# Patient Record
Sex: Male | Born: 1970 | Race: Black or African American | Hispanic: No | Marital: Married | Smoking: Never smoker
Health system: Southern US, Community
[De-identification: ages and names within clinical notes are randomized; demographics above are authoritative.]

## PROBLEM LIST (undated history)

## (undated) DIAGNOSIS — I743 Embolism and thrombosis of arteries of the lower extremities: Secondary | ICD-10-CM

## (undated) DIAGNOSIS — I10 Essential (primary) hypertension: Secondary | ICD-10-CM

## (undated) DIAGNOSIS — Z87828 Personal history of other (healed) physical injury and trauma: Secondary | ICD-10-CM

## (undated) DIAGNOSIS — G4730 Sleep apnea, unspecified: Secondary | ICD-10-CM

## (undated) DIAGNOSIS — N186 End stage renal disease: Secondary | ICD-10-CM

## (undated) DIAGNOSIS — E559 Vitamin D deficiency, unspecified: Secondary | ICD-10-CM

## (undated) DIAGNOSIS — I87009 Postthrombotic syndrome without complications of unspecified extremity: Secondary | ICD-10-CM

## (undated) DIAGNOSIS — D649 Anemia, unspecified: Secondary | ICD-10-CM

## (undated) DIAGNOSIS — R600 Localized edema: Secondary | ICD-10-CM

## (undated) DIAGNOSIS — N189 Chronic kidney disease, unspecified: Secondary | ICD-10-CM

## (undated) DIAGNOSIS — Z992 Dependence on renal dialysis: Secondary | ICD-10-CM

## (undated) DIAGNOSIS — Z86718 Personal history of other venous thrombosis and embolism: Secondary | ICD-10-CM

## (undated) DIAGNOSIS — K5790 Diverticulosis of intestine, part unspecified, without perforation or abscess without bleeding: Secondary | ICD-10-CM

## (undated) DIAGNOSIS — E785 Hyperlipidemia, unspecified: Secondary | ICD-10-CM

## (undated) HISTORY — DX: Personal history of other (healed) physical injury and trauma: Z87.828

## (undated) HISTORY — DX: Vitamin D deficiency, unspecified: E55.9

## (undated) HISTORY — DX: Chronic kidney disease, unspecified: N18.9

## (undated) HISTORY — DX: Diverticulosis of intestine, part unspecified, without perforation or abscess without bleeding: K57.90

## (undated) HISTORY — DX: Sleep apnea, unspecified: G47.30

## (undated) HISTORY — DX: Embolism and thrombosis of arteries of the lower extremities: I74.3

## (undated) HISTORY — DX: Personal history of other venous thrombosis and embolism: Z86.718

## (undated) HISTORY — DX: Essential (primary) hypertension: I10

## (undated) HISTORY — DX: Hyperlipidemia, unspecified: E78.5

## (undated) HISTORY — DX: End stage renal disease: N18.6

## (undated) HISTORY — DX: Localized edema: R60.0

## (undated) HISTORY — DX: End stage renal disease: Z99.2

## (undated) HISTORY — DX: Anemia, unspecified: D64.9

## (undated) HISTORY — DX: Postthrombotic syndrome without complications of unspecified extremity: I87.009

---

## 2000-01-08 ENCOUNTER — Encounter: Admission: RE | Admit: 2000-01-08 | Discharge: 2000-04-07 | Payer: Self-pay | Admitting: Internal Medicine

## 2002-04-09 ENCOUNTER — Emergency Department (HOSPITAL_COMMUNITY): Admission: EM | Admit: 2002-04-09 | Discharge: 2002-04-10 | Payer: Self-pay | Admitting: Emergency Medicine

## 2002-05-26 ENCOUNTER — Encounter (HOSPITAL_BASED_OUTPATIENT_CLINIC_OR_DEPARTMENT_OTHER): Admission: RE | Admit: 2002-05-26 | Discharge: 2002-08-24 | Payer: Self-pay | Admitting: Internal Medicine

## 2004-08-29 ENCOUNTER — Emergency Department (HOSPITAL_COMMUNITY): Admission: EM | Admit: 2004-08-29 | Discharge: 2004-08-29 | Payer: Self-pay | Admitting: Emergency Medicine

## 2006-12-10 ENCOUNTER — Encounter: Admission: RE | Admit: 2006-12-10 | Discharge: 2006-12-10 | Payer: Self-pay | Admitting: Emergency Medicine

## 2006-12-18 ENCOUNTER — Ambulatory Visit (HOSPITAL_BASED_OUTPATIENT_CLINIC_OR_DEPARTMENT_OTHER): Admission: RE | Admit: 2006-12-18 | Discharge: 2006-12-18 | Payer: Self-pay | Admitting: General Surgery

## 2006-12-18 ENCOUNTER — Encounter (INDEPENDENT_AMBULATORY_CARE_PROVIDER_SITE_OTHER): Payer: Self-pay | Admitting: General Surgery

## 2010-02-27 ENCOUNTER — Emergency Department (HOSPITAL_COMMUNITY)
Admission: EM | Admit: 2010-02-27 | Discharge: 2010-02-27 | Payer: Self-pay | Source: Home / Self Care | Admitting: Emergency Medicine

## 2010-03-24 ENCOUNTER — Encounter: Payer: Self-pay | Admitting: Emergency Medicine

## 2010-06-13 ENCOUNTER — Other Ambulatory Visit (HOSPITAL_COMMUNITY): Payer: Self-pay | Admitting: Nephrology

## 2010-06-19 ENCOUNTER — Other Ambulatory Visit: Payer: Self-pay | Admitting: Diagnostic Radiology

## 2010-06-19 ENCOUNTER — Ambulatory Visit (HOSPITAL_COMMUNITY)
Admission: RE | Admit: 2010-06-19 | Discharge: 2010-06-19 | Disposition: A | Discharge status: Home / Self Care | Payer: Federal, State, Local not specified - PPO | Source: Ambulatory Visit | Attending: Nephrology | Admitting: Nephrology

## 2010-06-19 DIAGNOSIS — N049 Nephrotic syndrome with unspecified morphologic changes: Secondary | ICD-10-CM | POA: Insufficient documentation

## 2010-06-19 LAB — CBC
Hemoglobin: 11.7 g/dL — ABNORMAL LOW (ref 13.0–17.0)
MCV: 86.9 fL (ref 78.0–100.0)
RBC: 4.11 MIL/uL — ABNORMAL LOW (ref 4.22–5.81)
WBC: 6.6 10*3/uL (ref 4.0–10.5)

## 2010-06-19 LAB — PROTIME-INR: INR: 0.89 (ref 0.00–1.49)

## 2010-07-09 ENCOUNTER — Ambulatory Visit
Admission: RE | Admit: 2010-07-09 | Discharge: 2010-07-09 | Disposition: A | Discharge status: Home / Self Care | Payer: Federal, State, Local not specified - PPO | Source: Ambulatory Visit | Attending: Nephrology | Admitting: Nephrology

## 2010-07-09 ENCOUNTER — Other Ambulatory Visit: Payer: Self-pay | Admitting: Nephrology

## 2010-07-09 DIAGNOSIS — N052 Unspecified nephritic syndrome with diffuse membranous glomerulonephritis: Secondary | ICD-10-CM

## 2010-07-16 NOTE — Op Note (Signed)
NAMEKEVONTA, SPREHE            ACCOUNT NO.:  [id number]   MEDICAL RECORD NO.:  [id number]          PATIENT TYPE:  AMB   LOCATION:  [place]                          FACILITY:  [facility]   PHYSICIAN:  Mare Loan, MD      DATE OF BIRTH:  January 16, 1971   DATE OF PROCEDURE:  12/18/2006  DATE OF DISCHARGE:  12/18/2006                               OPERATIVE REPORT   PREOPERATIVE DIAGNOSIS:  Soft tissue mass.   POSTOPERATIVE DIAGNOSIS:  Soft tissue mass.   PROCEDURE:  Excision of a soft tissue mass, presumed lipoma.   SURGEON:  Mare Loan, MD   INDICATIONS FOR PROCEDURE:  Mr. Lather is a 40 year old male who had a  lump noticed on his left posterior thorax region.  Over the past year,  he believes it had been growing in size and was a concern to him.  Informed consent was obtained prior to the procedure.  The patient was  placed under general endotracheal anesthesia.   SPECIMENS:  Fatty tissue, sent to pathology for review.   COMPLICATIONS:  There were no immediate complications.   DRAINS:  None were placed.   ESTIMATED BLOOD LOSS:  Minimal.   DESCRIPTION OF PROCEDURE:  Mr. Ondo was identified in the  preoperative holding suite and received preoperative antibiotics of  Kefzol.  He was then taken to the operating room, where he received  general endotracheal anesthesia.  He was then placed in a prone  position.  His right posterior thorax was prepped and draped in the  usual sterile fashion.  A timeout procedure, indicating the patient and  the procedure, were performed.  The area over the lesion was  anesthetized with 0.25% Marcaine.  Using a #10 blade, skin was incised,  approximately 5 cm over the area of concern.  Using blunt dissection,  the fatty tissue was removed with the electrocautery.  The specimen was  fully removed.  I did continue the dissection down to the latissimus  dorsi.  The area was irrigated.  A small superior portion of additional  fatty tissue was  removed and sent to pathology.  There was no evidence  of bleeding at the end of the case.  The dermal layer was closed with 3-  0 Vicryl and the skin was closed with 4-0 Monocryl.  Steri-Strips, gauze  and Tegaderm were placed for final dressing.  The patient was then  extubated and transported to the postanesthesia care unit in stable  condition.     Mare Loan, MD  Electronically Signed    ALA/MEDQ  D:  12/18/2006  T:  12/19/2006  Job:  [id number]

## 2010-12-11 LAB — BASIC METABOLIC PANEL
BUN: 11
Chloride: 106
Creatinine, Ser: 1.15
GFR calc non Af Amer: >60
Glucose, Bld: 93
Potassium: 4

## 2010-12-11 LAB — POCT HEMOGLOBIN-HEMACUE: Hemoglobin: 15.1

## 2011-09-23 ENCOUNTER — Encounter: Payer: Self-pay | Admitting: Family Medicine

## 2012-02-02 LAB — CBC AND DIFFERENTIAL
HCT: 34 % — AB (ref 41–53)
Hemoglobin: 11.9 g/dL — AB (ref 13.5–17.5)

## 2012-02-03 LAB — BASIC METABOLIC PANEL: Glucose: 131 mg/dL

## 2012-05-28 ENCOUNTER — Encounter: Payer: Self-pay | Admitting: *Deleted

## 2012-08-09 ENCOUNTER — Ambulatory Visit (INDEPENDENT_AMBULATORY_CARE_PROVIDER_SITE_OTHER): Payer: Federal, State, Local not specified - PPO | Admitting: Emergency Medicine

## 2012-08-09 VITALS — BP 111/74 | HR 109 | Temp 98.5°F | Resp 16 | Ht 69.0 in | Wt 326.0 lb

## 2012-08-09 DIAGNOSIS — IMO0002 Reserved for concepts with insufficient information to code with codable children: Secondary | ICD-10-CM

## 2012-08-09 DIAGNOSIS — S83206A Unspecified tear of unspecified meniscus, current injury, right knee, initial encounter: Secondary | ICD-10-CM

## 2012-08-09 MED ORDER — TRAMADOL HCL 50 MG PO TABS: 50.0000 mg | ORAL_TABLET | Freq: Three times a day (TID) | ORAL | Status: DC | PRN

## 2012-08-09 NOTE — Patient Instructions (Addendum)
Knee, Cartilage (Meniscus) Injury It is suspected that you have a torn cartilage (meniscus) in your knee. The menisci are made of tough cartilage, and fit between the surfaces of the thigh and leg bones. The menisci are "C"shaped and have a wedged profile. The wedged profile helps the stability of the joint by keeping the rounded femur surface from sliding off the flat tibial surface. The menisci are fed (nourished) by small blood vessels; but there is also a large area at the inner edge of the meniscus that does not have a good blood supply (avascular). This presents a problem when there is an injury to the meniscus, because areas without good blood supply heal poorly. As a result when there is a torn cartilage in the knee, surgery is often required to fix it. This is usually done with a surgical procedure less invasive than open surgery (arthroscopy). Some times open surgery of the knee is required if there is other damage. PURPOSE OF THE MENISCUS The medial meniscus rests on the medial tibial plateau. The tibia is the large bone in your lower leg (the shin bone). The medial tibial plateau is the upper end of the bone making up the inner part of your knee. The lateral meniscus serves the same purpose and is located on the outside of the knee. The menisci help to distribute your body weight across the knee joint; they act as shock absorbers. Without the meniscus present, the weight of your body would be unevenly applied to the bones in your legs (the femur and tibia). The femur is the large bone in your thigh. This uneven weight distribution would cause increased wear and tear on the cartilage lining the joint surfaces, leading to early damage (arthritis) of these areas. The presence of the menisci cartilage is necessary for a healthy knee. PURPOSE OF THE KNEE CARTILAGE The knee joint is made up of three bones: the thigh bone (femur), the shin bone (tibia), and the knee cap (patella). The surfaces of these  bones at the knee joint are covered with cartilage called articular cartilage. This smooth slippery surface allows the bones to slide against each other without causing bone damage. The meniscus sits between these cartilaginous surfaces of the bones. It distributes the weight evenly in the joints and helps with the stability of the joint (keeps the joint steady). HOME CARE INSTRUCTIONS  Use crutches and external braces as instructed.  Once home an ice pack applied to your injured knee may help with discomfort and keep the swelling down. An ice pack can be used for the first couple of days or as instructed.  Only take over-the-counter or prescription medicines for pain, discomfort, or fever as directed by your caregiver.  Call if you do not have relief of pain with medications or if there is increasing in pain.  Call if your foot becomes cold or blue.  You may resume normal diet and activities as directed.  Make sure to keep your appointment with your follow-up caregiver. This injury may require further evaluation and treatment beyond the temporary treatment given today. Document Released: 05/10/2002 Document Revised: 05/12/2011 Document Reviewed: 09/01/2008 [employer] Patient Information 2014 [employer].  

## 2012-08-09 NOTE — Progress Notes (Signed)
[facility] [address], [place] [address] [phone]  Date:  08/09/2012   Name:  Hector Brooks   DOB:  [birth year]   MRN:  [id number]  PCP:  No PCP Per Patient    Chief Complaint: Knee Pain   History of Present Illness:  Hector Brooks is a 42 y.o. very pleasant male patient who presents with the following:  No history of defined injury to knee.  Says that he awakened in November last year and his knee swelled and was painful.  Now has intermittent locking and clicking in right knee associated with swelling and pain. Works as a Freight forwarder.  No improvement with over the counter medications or other home remedies. Denies other complaint or health concern today.   There are no active problems to display for this patient.   Past Medical History  Diagnosis Date  . Chronic kidney disease     History reviewed. No pertinent past surgical history.  History  Substance Use Topics  . Smoking status: Never Smoker   . Smokeless tobacco: Not on file  . Alcohol Use: No    History reviewed. No pertinent family history.  Allergies not on file  Medication list has been reviewed and updated.  No current outpatient prescriptions on file prior to visit.   No current facility-administered medications on file prior to visit.    Review of Systems:  As per HPI, otherwise negative.    Physical Examination: Filed Vitals:   08/09/12 1405  BP: 111/74  Pulse: 109  Temp: 98.5 F (36.9 C)  Resp: 16   Filed Vitals:   08/09/12 1405  Height: 5\' 9"  (1.753 m)  Weight: 326 lb (147.873 kg)   Body mass index is 48.12 kg/(m^2). Ideal Body Weight: Weight in (lb) to have BMI = 25: 168.9   GEN: WDWN, NAD, Non-toxic, Alert & Oriented x 3 HEENT: Atraumatic, Normocephalic.  Ears and Nose: No external deformity. EXTR: No clubbing/cyanosis/edema NEURO: Normal gait.  PSYCH: Normally interactive. Conversant. Not depressed or anxious appearing.  Calm demeanor.   KNEE:  No effusion, locking or clicking. Joint stable.   Assessment and Plan: Meniscus tear Ortho referral due inability to prescribe NSAID   Signed,  Ellison Carwin, MD

## 2012-08-19 ENCOUNTER — Ambulatory Visit
Admission: RE | Admit: 2012-08-19 | Discharge: 2012-08-19 | Disposition: A | Discharge status: Home / Self Care | Payer: Federal, State, Local not specified - PPO | Source: Ambulatory Visit | Attending: Emergency Medicine | Admitting: Emergency Medicine

## 2012-08-19 DIAGNOSIS — S83206A Unspecified tear of unspecified meniscus, current injury, right knee, initial encounter: Secondary | ICD-10-CM

## 2012-12-02 ENCOUNTER — Encounter (HOSPITAL_COMMUNITY): Payer: Self-pay | Admitting: Emergency Medicine

## 2012-12-02 ENCOUNTER — Emergency Department (HOSPITAL_COMMUNITY)
Admission: EM | Admit: 2012-12-02 | Discharge: 2012-12-02 | Disposition: A | Discharge status: Home / Self Care | Payer: Federal, State, Local not specified - PPO | Attending: Emergency Medicine | Admitting: Emergency Medicine

## 2012-12-02 DIAGNOSIS — Z79899 Other long term (current) drug therapy: Secondary | ICD-10-CM | POA: Insufficient documentation

## 2012-12-02 DIAGNOSIS — N189 Chronic kidney disease, unspecified: Secondary | ICD-10-CM | POA: Insufficient documentation

## 2012-12-02 DIAGNOSIS — T4995XA Adverse effect of unspecified topical agent, initial encounter: Secondary | ICD-10-CM | POA: Insufficient documentation

## 2012-12-02 DIAGNOSIS — T7840XA Allergy, unspecified, initial encounter: Secondary | ICD-10-CM

## 2012-12-02 DIAGNOSIS — L500 Allergic urticaria: Secondary | ICD-10-CM | POA: Insufficient documentation

## 2012-12-02 DIAGNOSIS — L509 Urticaria, unspecified: Secondary | ICD-10-CM

## 2012-12-02 MED ORDER — PREDNISONE 20 MG PO TABS
60.0000 mg | ORAL_TABLET | Freq: Once | ORAL | Status: AC
  Administered 2012-12-02: 60 mg via ORAL
  Filled 2012-12-02: qty 3

## 2012-12-02 MED ORDER — FAMOTIDINE 20 MG PO TABS
20.0000 mg | ORAL_TABLET | Freq: Once | ORAL | Status: AC
  Administered 2012-12-02: 20 mg via ORAL
  Filled 2012-12-02: qty 1

## 2012-12-02 MED ORDER — HYDROXYZINE HCL 25 MG PO TABS: 25.0000 mg | ORAL_TABLET | Freq: Four times a day (QID) | ORAL | Status: DC

## 2012-12-02 MED ORDER — HYDROXYZINE HCL 25 MG PO TABS
50.0000 mg | ORAL_TABLET | Freq: Once | ORAL | Status: AC
  Administered 2012-12-02: 50 mg via ORAL
  Filled 2012-12-02: qty 2

## 2012-12-02 MED ORDER — FAMOTIDINE 20 MG PO TABS: 20.0000 mg | ORAL_TABLET | Freq: Two times a day (BID) | ORAL | Status: DC

## 2012-12-02 MED ORDER — PREDNISONE 10 MG PO TABS: ORAL_TABLET | ORAL | Status: DC

## 2012-12-02 NOTE — ED Provider Notes (Signed)
Medical screening examination/treatment/procedure(s) were performed by non-physician practitioner and as supervising physician I was immediately available for consultation/collaboration.  Jasper Riling. Alvino Chapel, MD 12/02/12 [phone]

## 2012-12-02 NOTE — ED Notes (Signed)
Pt sts last night while he was sleeping he woke up and was very itching, took some benadryl and went back to sleep, woke up and was still itchy then went to work [employer]) and it itching became worse so he decided to come get checked out. Pt sts he has a rash all over body. Denies use of new lotions/laundry detergents/soap. NKA. Sts he ate baked chicken, potatoes and broccoli, sts he has eaten this items multiple times and never had issues. Pt is afraid he got bite by something outside because he works outside delivering mail and has been bitten but things in the past. Pt in nad, skin warm and dry, resp e/u. Airway intact, speaking in full sentences.

## 2012-12-02 NOTE — ED Provider Notes (Signed)
CSN: [id number]     Arrival date & time 12/02/12  0755 History   First MD Initiated Contact with Patient 12/02/12 0759     Chief Complaint  Patient presents with  . Allergic Reaction   (Consider location/radiation/quality/duration/timing/severity/associated sxs/prior Treatment) HPI Hector Brooks is a 42 y.o. male who presents to emergency department complaining of rash and itching. Patient states that he works for [employer] outside [employer], states that yesterday after he came home from work she started having itching to the arms and legs.. He states he took Benadryl yesterday which seemed to improve his symptoms. States he woke up this morning again with the same rash and itching. States he did not take any medications today. Patient denies any new products as far as he can remember. He denies any new soaps, lotions, new detergent. Patient states the rash is mainly in his thighs, upper arms, back. He denies any respiratory difficulties. He denies any rash inside his mouth, he denies any swelling of his tongue, lips, throat. He denies any prior similar reactions. He denies coming in contact with any new chemicals or products. He denies any new foods.  Past Medical History  Diagnosis Date  . Chronic kidney disease    History reviewed. No pertinent past surgical history. No family history on file. History  Substance Use Topics  . Smoking status: Never Smoker   . Smokeless tobacco: Not on file  . Alcohol Use: No    Review of Systems  Constitutional: Negative for fever and chills.  HENT: Negative for facial swelling, neck pain and neck stiffness.   Respiratory: Negative for cough, chest tightness and shortness of breath.   Cardiovascular: Negative for chest pain, palpitations and leg swelling.  Genitourinary: Negative for dysuria, urgency, frequency and hematuria.  Musculoskeletal: Negative for myalgias and arthralgias.  Skin: Positive for rash.  Allergic/Immunologic:  Negative for immunocompromised state.  Neurological: Negative for dizziness, weakness, light-headedness, numbness and headaches.    Allergies  Review of patient's allergies indicates no known allergies.  Home Medications   Current Outpatient Rx  Name  Route  Sig  Dispense  Refill  . amLODipine (NORVASC) 10 MG tablet   Oral   Take 10 mg by mouth daily.         Marland Kitchen atorvastatin (LIPITOR) 80 MG tablet   Oral   Take 80 mg by mouth daily.         Marland Kitchen labetalol (NORMODYNE) 200 MG tablet   Oral   Take 200 mg by mouth 2 (two) times daily.         Marland Kitchen lisinopril-hydrochlorothiazide (PRINZIDE,ZESTORETIC) 20-25 MG per tablet   Oral   Take 1 tablet by mouth daily.         . Multiple Vitamin (MULTIVITAMIN) tablet   Oral   Take 1 tablet by mouth daily.         Marland Kitchen torsemide (DEMADEX) 20 MG tablet   Oral   Take 20 mg by mouth daily.         . traMADol (ULTRAM) 50 MG tablet   Oral   Take 1 tablet (50 mg total) by mouth every 8 (eight) hours as needed for pain.   30 tablet   0    BP 120/75  Pulse 88  Temp(Src) 97.9 F (36.6 C) (Oral)  Resp 20  Ht 5\' 9"  (1.753 m)  Wt 320 lb (145.151 kg)  BMI 47.23 kg/m2  SpO2 98% Physical Exam  Nursing note and vitals reviewed. Constitutional:  He is oriented to person, place, and time. He appears well-developed and well-nourished. No distress.  HENT:  Head: Normocephalic and atraumatic.  No oral mucosal rash. No swelling of the lips, tongue, uvula  Eyes: Conjunctivae are normal.  Neck: Neck supple.  Cardiovascular: Normal rate, regular rhythm and normal heart sounds.   Pulmonary/Chest: Effort normal. No respiratory distress. He has no wheezes. He has no rales.  Musculoskeletal: He exhibits no edema.  Neurological: He is alert and oriented to person, place, and time.  Skin: Skin is warm and dry.  Hives to the bilateral thighs, upper arms, back.    ED Course  Procedures (including critical care time) Labs Review Labs Reviewed -  No data to display Imaging Review No results found.  MDM   1. Allergic reaction, initial encounter   2. Urticaria     9:36 AM Patient received 50 mg of Vistaril, 20 mg of Pepcid, 60 mg of prednisone emergency department for his hives. He has no respiratory problems, no swelling of the lips, tongue, throat. He is in no distress. Patient will be discharged home with vistaril, pepcid, and 4 more days if prednisone. Instructed to use hypoallergenic products. Follow up with pcp. Return precautions given.   Filed Vitals:   12/02/12 0830  BP: 103/62  Pulse: 91  Temp:   Resp:       Casen Pryor A Terryn Redner, PA-C 12/02/12 1043

## 2013-01-31 HISTORY — PX: MENISCUS REPAIR: SHX5179

## 2013-03-10 ENCOUNTER — Emergency Department (HOSPITAL_COMMUNITY)
Admission: EM | Admit: 2013-03-10 | Discharge: 2013-03-10 | Disposition: A | Discharge status: Home / Self Care | Payer: Federal, State, Local not specified - PPO | Attending: Emergency Medicine | Admitting: Emergency Medicine

## 2013-03-10 ENCOUNTER — Telehealth: Payer: Self-pay

## 2013-03-10 ENCOUNTER — Other Ambulatory Visit (HOSPITAL_COMMUNITY): Payer: Self-pay | Admitting: Orthopedic Surgery

## 2013-03-10 ENCOUNTER — Ambulatory Visit (HOSPITAL_COMMUNITY)
Admission: RE | Admit: 2013-03-10 | Discharge: 2013-03-10 | Disposition: A | Discharge status: Home / Self Care | Payer: Federal, State, Local not specified - PPO | Source: Ambulatory Visit | Attending: Orthopedic Surgery | Admitting: Orthopedic Surgery

## 2013-03-10 ENCOUNTER — Encounter (HOSPITAL_COMMUNITY): Payer: Self-pay | Admitting: Emergency Medicine

## 2013-03-10 DIAGNOSIS — N189 Chronic kidney disease, unspecified: Secondary | ICD-10-CM | POA: Insufficient documentation

## 2013-03-10 DIAGNOSIS — M25561 Pain in right knee: Secondary | ICD-10-CM

## 2013-03-10 DIAGNOSIS — I82409 Acute embolism and thrombosis of unspecified deep veins of unspecified lower extremity: Secondary | ICD-10-CM | POA: Insufficient documentation

## 2013-03-10 DIAGNOSIS — M7989 Other specified soft tissue disorders: Secondary | ICD-10-CM

## 2013-03-10 DIAGNOSIS — Z79899 Other long term (current) drug therapy: Secondary | ICD-10-CM | POA: Insufficient documentation

## 2013-03-10 DIAGNOSIS — I82401 Acute embolism and thrombosis of unspecified deep veins of right lower extremity: Secondary | ICD-10-CM

## 2013-03-10 LAB — POCT I-STAT, CHEM 8
BUN: 14 mg/dL (ref 6–23)
CALCIUM ION: 1.28 mmol/L — AB (ref 1.12–1.23)
CREATININE: 1.3 mg/dL (ref 0.50–1.35)
Chloride: 103 mEq/L (ref 96–112)
GLUCOSE: 91 mg/dL (ref 70–99)
HCT: 40 % (ref 39.0–52.0)
Hemoglobin: 13.6 g/dL (ref 13.0–17.0)
Potassium: 4.5 mEq/L (ref 3.7–5.3)
Sodium: 145 mEq/L (ref 137–147)
TCO2: 30 mmol/L (ref 0–100)

## 2013-03-10 MED ORDER — WARFARIN SODIUM 5 MG PO TABS: 5.0000 mg | ORAL_TABLET | Freq: Every day | ORAL | Status: DC

## 2013-03-10 MED ORDER — ENOXAPARIN SODIUM 60 MG/0.6ML ~~LOC~~ SOLN
150.0000 mg | Freq: Once | SUBCUTANEOUS | Status: DC
Start: 2013-03-10 — End: 2013-03-10
  Filled 2013-03-10: qty 1.8

## 2013-03-10 MED ORDER — ENOXAPARIN SODIUM 30 MG/0.3ML ~~LOC~~ SOLN: 1.0000 mg/kg | Freq: Two times a day (BID) | SUBCUTANEOUS | Status: DC

## 2013-03-10 MED ORDER — ENOXAPARIN SODIUM 150 MG/ML ~~LOC~~ SOLN
150.0000 mg | Freq: Once | SUBCUTANEOUS | Status: AC
  Administered 2013-03-10: 150 mg via SUBCUTANEOUS
  Filled 2013-03-10: qty 1

## 2013-03-10 NOTE — ED Provider Notes (Signed)
Medical screening examination/treatment/procedure(s) were performed by non-physician practitioner and as supervising physician I was immediately available for consultation/collaboration.  EKG Interpretation   None         Marnie Fazzino T Vibhav Waddill, MD 03/10/13 2344 

## 2013-03-10 NOTE — Telephone Encounter (Signed)
Opened in error

## 2013-03-10 NOTE — Discharge Instructions (Signed)
Take Coumadin and Lovenox as directed. Follow up with a doctor at [facility] who will monitor your blood thinners. Refer to attached documents for more information. Return to the ED with worsening or concerning symptoms.

## 2013-03-10 NOTE — ED Notes (Signed)
Pt had surgery to R knee to repair torn meniscus on Dec 12.  Was sent for [place] by pcp today and had a positive for DVT.  Swelling to R lower leg.

## 2013-03-10 NOTE — Progress Notes (Signed)
VASCULAR LAB PRELIMINARY  PRELIMINARY  PRELIMINARY  PRELIMINARY  Right lower extremity venous Doppler completed.    Preliminary report:  There is acute, non occlusive DVT noted in the right common femoral vein and occlusive DVT in the femoral, popliteal, and posterior tibial veins.  No propagation noted to the left lower extremity.  Olena Willy, RVT 03/10/2013, 4:03 PM

## 2013-03-10 NOTE — ED Provider Notes (Signed)
CSN: [id number]     Arrival date & time 03/10/13  1559 History  This chart was scribed for non-physician practitioner Alvina Chou, PA-C working with Ephraim Hamburger, MD by Rolanda Lundborg, ED Scribe. This patient was seen in room TR08C/TR08C and the patient's care was started at 6:38 PM.   Chief Complaint  Patient presents with  . DVT   The history is provided by the patient. No language interpreter was used.   HPI Comments: Hector Brooks is a 43 y.o. male who presents to the Emergency Department with a DVT to his right lower leg with associated swelling and pain. Pt had surgery to right knee to repair torn meniscus on 12/12 and was sent for a followup US today. Pt was positive for DVT and was told to come here.  He reports the swelling has been present for 2 weeks. He reports the right lower leg is warm to touch.  [facility] - [facility] [address]   Past Medical History  Diagnosis Date  . Chronic kidney disease    Past Surgical History  Procedure Laterality Date  . Meniscus repair Right 12/14   No family history on file. History  Substance Use Topics  . Smoking status: Never Smoker   . Smokeless tobacco: Not on file  . Alcohol Use: No    Review of Systems  Cardiovascular: Positive for leg swelling.  All other systems reviewed and are negative.    Allergies  Review of patient's allergies indicates no known allergies.  Home Medications   Current Outpatient Rx  Name  Route  Sig  Dispense  Refill  . amLODipine (NORVASC) 10 MG tablet   Oral   Take 10 mg by mouth daily.         Marland Kitchen atorvastatin (LIPITOR) 80 MG tablet   Oral   Take 80 mg by mouth at bedtime.          . famotidine (PEPCID) 20 MG tablet   Oral   Take 1 tablet (20 mg total) by mouth 2 (two) times daily.   30 tablet   0   . IBUPROFEN PO   Oral   Take 2-3 tablets by mouth 2 (two) times daily as needed (pain).         Marland Kitchen labetalol (NORMODYNE) 200 MG tablet   Oral  Take 200 mg by mouth 2 (two) times daily.         Marland Kitchen lisinopril (PRINIVIL,ZESTRIL) 40 MG tablet   Oral   Take 40 mg by mouth daily.         . Multiple Vitamin (MULTIVITAMIN) tablet   Oral   Take 1 tablet by mouth daily.         Marland Kitchen torsemide (DEMADEX) 20 MG tablet   Oral   Take 20 mg by mouth daily.          BP 135/87  Pulse 81  Temp(Src) 97.5 F (36.4 C) (Oral)  Resp 20  SpO2 98% Physical Exam  Nursing note and vitals reviewed. Constitutional: He is oriented to person, place, and time. He appears well-developed and well-nourished. No distress.  HENT:  Head: Normocephalic and atraumatic.  Eyes: EOM are normal.  Neck: Neck supple. No tracheal deviation present.  Cardiovascular: Normal rate.   Pulmonary/Chest: Effort normal. No respiratory distress.  Musculoskeletal: Normal range of motion.  Right lower leg edema with calf ttp.  Neurological: He is alert and oriented to person, place, and time.  Skin: Skin  is warm and dry.  Psychiatric: He has a normal mood and affect. His behavior is normal.    ED Course  Procedures (including critical care time) Medications  enoxaparin (LOVENOX) injection 1 mg/kg (not administered)   DIAGNOSTIC STUDIES: Oxygen Saturation is 98% on RA, normal by my interpretation.    COORDINATION OF CARE: 7:28 PM- Discussed treatment plan with pt which includes Lovenox injection. Pt agrees to plan.    Labs Review Labs Reviewed  POCT I-STAT, CHEM 8 - Abnormal; Notable for the following:    Calcium, Ion 1.28 (*)    All other components within normal limits   Imaging Review No results found.  EKG Interpretation   None       MDM   1. DVT (deep venous thrombosis), right    7:48 PM Patient has a diagnosed DVT. Patient has basic labs and PT-INR. Patient will be started on lovenox and coumadin. Patient has a PCP at [facility] and he will follow up tomorrow. Patient denies chest pain and SOB. Vitals stable and patient afebrile.    I personally performed the services described in this documentation, which was scribed in my presence. The recorded information has been reviewed and is accurate.    Alvina Chou, [place] 03/10/13 1951

## 2013-03-18 ENCOUNTER — Ambulatory Visit (INDEPENDENT_AMBULATORY_CARE_PROVIDER_SITE_OTHER): Payer: Federal, State, Local not specified - PPO | Admitting: Emergency Medicine

## 2013-03-18 ENCOUNTER — Other Ambulatory Visit: Payer: Self-pay | Admitting: Emergency Medicine

## 2013-03-18 VITALS — BP 120/80 | HR 117 | Temp 98.8°F | Resp 18 | Ht 68.0 in | Wt 315.0 lb

## 2013-03-18 DIAGNOSIS — I82409 Acute embolism and thrombosis of unspecified deep veins of unspecified lower extremity: Secondary | ICD-10-CM

## 2013-03-18 DIAGNOSIS — O2230 Deep phlebothrombosis in pregnancy, unspecified trimester: Secondary | ICD-10-CM

## 2013-03-18 LAB — POCT CBC
Granulocyte percent: 56.5 %G (ref 37–80)
HCT, POC: 42.8 % — AB (ref 43.5–53.7)
Hemoglobin: 13.3 g/dL — AB (ref 14.1–18.1)
Lymph, poc: 2.1 (ref 0.6–3.4)
MCH, POC: 29.4 pg (ref 27–31.2)
MCHC: 31.1 g/dL — AB (ref 31.8–35.4)
MCV: 94.6 fL (ref 80–97)
MID (cbc): 0.5 (ref 0–0.9)
MPV: 8.3 fL (ref 0–99.8)
POC Granulocyte: 3.4 (ref 2–6.9)
POC LYMPH PERCENT: 35.6 %L (ref 10–50)
POC MID %: 7.9 %M (ref 0–12)
Platelet Count, POC: 228 10*3/uL (ref 142–424)
RBC: 4.52 M/uL — AB (ref 4.69–6.13)
RDW, POC: 14.7 %
WBC: 6 10*3/uL (ref 4.6–10.2)

## 2013-03-18 LAB — APTT: aPTT: 43 seconds — ABNORMAL HIGH (ref 24–37)

## 2013-03-18 LAB — PROTIME-INR
INR: 1.3 (ref <1.50)
Prothrombin Time: 16 seconds — ABNORMAL HIGH (ref 11.6–15.2)

## 2013-03-18 MED ORDER — WARFARIN SODIUM 2.5 MG PO TABS: 2.5000 mg | ORAL_TABLET | Freq: Every day | ORAL | Status: DC

## 2013-03-18 NOTE — Progress Notes (Signed)
[facility] [address] [place] [address] [phone]  Date:  03/18/2013   Name:  Hector Brooks   DOB:  [birth year]   MRN:  [id number]  PCP:  No PCP Per Patient    Chief Complaint: Hospitalization Follow-up   History of Present Illness:  [name] is a 43 y.o. very pleasant male patient who presents with the following:  Had surgical repair of meniscus tear 12/12.  Was seen in ortho last on 1/8 and found to have DVT of the operated leg and sent to ER and started on coumadin and enoxaprin.  Here for follow up.  Has less pain and swelling since.  No chest pain, cough, wheezing, shortness of breath or hemoptysis.  Ambulates easily.  No improvement with over the counter medications or other home remedies. Denies other complaint or health concern today.   There are no active problems to display for this patient.   Past Medical History  Diagnosis Date  . Chronic kidney disease     Past Surgical History  Procedure Laterality Date  . Meniscus repair Right 12/14    History  Substance Use Topics  . Smoking status: Never Smoker   . Smokeless tobacco: Not on file  . Alcohol Use: No    History reviewed. No pertinent family history.  No Known Allergies  Medication list has been reviewed and updated.  Current Outpatient Prescriptions on File Prior to Visit  Medication Sig Dispense Refill  . amLODipine (NORVASC) 10 MG tablet Take 10 mg by mouth daily.      Marland Kitchen atorvastatin (LIPITOR) 80 MG tablet Take 80 mg by mouth at bedtime.       . enoxaparin (LOVENOX) 30 MG/0.3ML injection Inject 1.4 mLs (140 mg total) into the skin every 12 (twelve) hours.  40 mL  0  . IBUPROFEN PO Take 2-3 tablets by mouth 2 (two) times daily as needed (pain).      Marland Kitchen labetalol (NORMODYNE) 200 MG tablet Take 200 mg by mouth 2 (two) times daily.      Marland Kitchen lisinopril (PRINIVIL,ZESTRIL) 40 MG tablet Take 40 mg by mouth daily.      . Multiple Vitamin (MULTIVITAMIN) tablet Take 1  tablet by mouth daily.      Marland Kitchen torsemide (DEMADEX) 20 MG tablet Take 20 mg by mouth daily.      Marland Kitchen warfarin (COUMADIN) 5 MG tablet Take 1 tablet (5 mg total) by mouth daily.  30 tablet  0  . famotidine (PEPCID) 20 MG tablet Take 1 tablet (20 mg total) by mouth 2 (two) times daily.  30 tablet  0   No current facility-administered medications on file prior to visit.    Review of Systems:  As per HPI, otherwise negative.    Physical Examination: Filed Vitals:   03/18/13 1005  BP: 120/80  Pulse: 117  Temp: 98.8 F (37.1 C)  Resp: 18   Filed Vitals:   03/18/13 1005  Height: 5\' 8"  (1.727 m)  Weight: 315 lb (142.883 kg)   Body mass index is 47.91 kg/(m^2). Ideal Body Weight: Weight in (lb) to have BMI = 25: 164.1  GEN: obese, NAD, Non-toxic, A & O x 3 HEENT: Atraumatic, Normocephalic. Neck supple. No masses, No LAD. Ears and Nose: No external deformity. CV: RRR, No M/G/R. No JVD. No thrill. No extra heart sounds. PULM: CTA B, no wheezes, crackles, rhonchi. No retractions. No resp. distress. No accessory muscle use. ABD: S, NT, ND, +BS.  No rebound. No HSM. EXTR: No c/c/e NEURO Normal gait.  PSYCH: Normally interactive. Conversant. Not depressed or anxious appearing.  Calm demeanor.    Assessment and Plan: DVT Ok to resume PT on 1/22 Await PT/INR and APTT  Signed,  Ellison Carwin, MD   Results for orders placed in visit on 03/18/13  POCT CBC      Result Value Range   WBC 6.0  4.6 - 10.2 K/uL   Lymph, poc 2.1  0.6 - 3.4   POC LYMPH PERCENT 35.6  10 - 50 %L   MID (cbc) 0.5  0 - 0.9   POC MID % 7.9  0 - 12 %M   POC Granulocyte 3.4  2 - 6.9   Granulocyte percent 56.5  37 - 80 %G   RBC 4.52 (*) 4.69 - 6.13 M/uL   Hemoglobin 13.3 (*) 14.1 - 18.1 g/dL   HCT, POC 42.8 (*) 43.5 - 53.7 %   MCV 94.6  80 - 97 fL   MCH, POC 29.4  27 - 31.2 pg   MCHC 31.1 (*) 31.8 - 35.4 g/dL   RDW, POC 14.7     Platelet Count, POC 228  142 - 424 K/uL   MPV 8.3  0 - 99.8 fL

## 2013-03-18 NOTE — Patient Instructions (Signed)
Deep Vein Thrombosis  A deep vein thrombosis (DVT) is a blood clot that develops in the deep, larger veins of the leg, arm, or pelvis. These are more dangerous than clots that might form in veins near the surface of the body. A DVT can lead to complications if the clot breaks off and travels in the bloodstream to the lungs.   A DVT can damage the valves in your leg veins, so that instead of flowing upward, the blood pools in the lower leg. This is called post-thrombotic syndrome, and it can result in pain, swelling, discoloration, and sores on the leg.  CAUSES  Usually, several things contribute to blood clots forming. Contributing factors include:   The flow of blood slows down.   The inside of the vein is damaged in some way.   You have a condition that makes blood clot more easily.  RISK FACTORS  Some people are more likely than others to develop blood clots. Risk factors include:    Older age, especially over 75 years of age.   Having a family history of blood clots or if you have already had a blot clot.   Having major or lengthy surgery. This is especially true for surgery on the hip, knee, or belly (abdomen). Hip surgery is particularly high risk.   Breaking a hip or leg.   Sitting or lying still for a long time. This includes long-distance travel, paralysis, or recovery from an illness or surgery.   Having cancer or cancer treatment.   Having a long, thin tube (catheter) placed inside a vein during a medical procedure.   Being overweight (obese).   Pregnancy and childbirth.   Hormone changes make the blood clot more easily during pregnancy.   The fetus puts pressure on the veins of the pelvis.   There is a risk of injury to veins during delivery or a caesarean. The risk is highest just after childbirth.   Medicines with the male hormone estrogen. This includes birth control pills and hormone replacement therapy.   Smoking.   Other circulation or heart problems.    SIGNS AND SYMPTOMS  When  a clot forms, it can either partially or totally block the blood flow in that vein. Symptoms of a DVT can include:   Swelling of the leg or arm, especially if one side is much worse.   Warmth and redness of the leg or arm, especially if one side is much worse.   Pain in an arm or leg. If the clot is in the leg, symptoms may be more noticeable or worse when standing or walking.  The symptoms of a DVT that has traveled to the lungs (pulmonary embolism, PE) usually start suddenly and include:   Shortness of breath.   Coughing.   Coughing up blood or blood-tinged phlegm.   Chest pain. The chest pain is often worse with deep breaths.   Rapid heartbeat.  Anyone with these symptoms should get emergency medical treatment right away. Call your local emergency services (911 in the [place] if you have these symptoms.  DIAGNOSIS  If a DVT is suspected, your health care provider will take a full medical history and perform a physical exam. Tests that also may be required include:   Blood tests, including studies of the clotting properties of the blood.   Ultrasonography to see if you have clots in your legs or lungs.   X-rays to show the flow of blood when dye is injected into the veins (  venography).   Studies of your lungs if you have any chest symptoms.  PREVENTION   Exercise the legs regularly. Take a brisk 30-minute walk every day.   Maintain a weight that is appropriate for your height.   Avoid sitting or lying in bed for long periods of time without moving your legs.   Women, particularly those over the age of 35 years, should consider the risks and benefits of taking estrogen medicines, including birth control pills.   Do not smoke, especially if you take estrogen medicines.   Long-distance travel can increase your risk of DVT. You should exercise your legs by walking or pumping the muscles every hour.   In-hospital prevention:   Many of the risk factors above relate to situations that exist with  hospitalization, either for illness, injury, or elective surgery.   Your health care provider will assess you for the need for venous thromboembolism prophylaxis when you are admitted to the hospital. If you are having surgery, your surgeon will assess you the day of or day after surgery.   Prevention may include medical and nonmedical measures.  TREATMENT  Once identified, a DVT can be treated. It can also be prevented in some circumstances. Once you have had a DVT, you may be at increased risk for a DVT in the future. The most common treatment for DVT is blood thinning (anticoagulant) medicine, which reduces the blood's tendency to clot. Anticoagulants can stop new blood clots from forming and stop old ones from growing. They cannot dissolve existing clots. Your body does this by itself over time. Anticoagulants can be given by mouth, by IV access, or by injection. Your health care provider will determine the best program for you. Other medicines or treatments that may be used are:   Heparin or related medicines (low molecular weight heparin) are usually the first treatment for a blood clot. They act quickly. However, they cannot be taken orally.   Heparin can cause a fall in a component of blood that stops bleeding and forms blood clots (platelets). You will be monitored with blood tests to be sure this does not occur.   Warfarin is an anticoagulant that can be swallowed. It takes a few days to start working, so usually heparin or related medicines are used in combination. Once warfarin is working, heparin is usually stopped.   Less commonly, clot dissolving drugs (thrombolytics) are used to dissolve a DVT. They carry a high risk of bleeding, so they are used mainly in severe cases, where your life or a limb is threatened.   Very rarely, a blood clot in the leg needs to be removed surgically.   If you are unable to take anticoagulants, your health care provider may arrange for you to have a filter placed  in a main vein in your abdomen. This filter prevents clots from traveling to your lungs.  HOME CARE INSTRUCTIONS   Take all medicines prescribed by your health care provider. Only take over-the-counter or prescription medicines for pain, fever, or discomfort as directed by your health care provider.   Warfarin. Most people will continue taking warfarin after hospital discharge. Your health care provider will advise you on the length of treatment (usually 3 6 months, sometimes lifelong).   Too much and too little warfarin are both dangerous. Too much warfarin increases the risk of bleeding. Too little warfarin continues to allow the risk for blood clots. While taking warfarin, you will need to have regular blood tests to measure your   blood clotting time. These blood tests usually include both the prothrombin time (PT) and international normalized ratio (INR) tests. The PT and INR results allow your health care provider to adjust your dose of warfarin. The dose can change for many reasons. It is critically important that you take warfarin exactly as prescribed, and that you have your PT and INR levels drawn exactly as directed.   Many foods, especially foods high in vitamin K, can interfere with warfarin and affect the PT and INR results. Foods high in vitamin K include spinach, kale, broccoli, cabbage, collard and turnip greens, brussel sprouts, peas, cauliflower, seaweed, and parsley as well as beef and pork liver, green tea, and soybean oil. You should eat a consistent amount of foods high in vitamin K. Avoid major changes in your diet, or notify your health care provider before changing your diet. Arrange a visit with a dietitian to answer your questions.   Many medicines can interfere with warfarin and affect the PT and INR results. You must tell your health care provider about any and all medicines you take. This includes all vitamins and supplements. Be especially cautious with aspirin and  anti-inflammatory medicines. Ask your health care provider before taking these. Do not take or discontinue any prescribed or over-the-counter medicine except on the advice of your health care provider or pharmacist.   Warfarin can have side effects, primarily excessive bruising or bleeding. You will need to hold pressure over cuts for longer than usual. Your health care provider or pharmacist will discuss other potential side effects.   Alcohol can change the body's ability to handle warfarin. It is best to avoid alcoholic drinks or consume only very small amounts while taking warfarin. Notify your health care provider if you change your alcohol intake.   Notify your dentist or other health care providers before procedures.   Activity. Ask your health care provider how soon you can go back to normal activities. It is important to stay active to prevent blood clots. If you are on anticoagulant medicine, avoid contact sports.   Exercise. It is very important to exercise. This is especially important while traveling, sitting, or standing for long periods of time. Exercise your legs by walking or by pumping the muscles frequently. Take frequent walks.   Compression stockings. These are tight elastic stockings that apply pressure to the lower legs. This pressure can help keep the blood in the legs from clotting. You may need to wear compression stockings at home to help prevent a DVT.   Do not smoke. If you smoke, quit. Ask your health care provider for help with quitting smoking.   Learn as much as you can about DVT. Knowing more about the condition should help you keep it from coming back.   Wear a medical alert bracelet or carry a medical alert card.  SEEK MEDICAL CARE IF:   You notice a rapid heartbeat.   You feel weaker or more tired than usual.   You feel faint.   You notice increased bruising.   You feel your symptoms are not getting better in the time expected.   You believe you are having side  effects of medicine.  SEEK IMMEDIATE MEDICAL CARE IF:   You have chest pain.   You have trouble breathing.   You have new or increased swelling or pain in one leg.   You cough up blood.   You notice blood in vomit, in a bowel movement, or in urine.  MAKE SURE   YOU:   Understand these instructions.   Will watch your condition.   Will get help right away if you are not doing well or get worse.  Document Released: 02/17/2005 Document Revised: 12/08/2012 Document Reviewed: 10/25/2012  [employer] Patient Information 2014 [employer].

## 2013-03-20 NOTE — Telephone Encounter (Signed)
PT REQUEST A 90 DAY SUPPLY

## 2013-04-09 ENCOUNTER — Other Ambulatory Visit (INDEPENDENT_AMBULATORY_CARE_PROVIDER_SITE_OTHER): Payer: Federal, State, Local not specified - PPO | Admitting: *Deleted

## 2013-04-09 DIAGNOSIS — I82409 Acute embolism and thrombosis of unspecified deep veins of unspecified lower extremity: Secondary | ICD-10-CM

## 2013-04-09 NOTE — Progress Notes (Signed)
Pt here for labs only. 

## 2013-04-10 LAB — PROTIME-INR
INR: 2.34 — ABNORMAL HIGH (ref <1.50)
Prothrombin Time: 25.1 seconds — ABNORMAL HIGH (ref 11.6–15.2)

## 2013-08-24 ENCOUNTER — Ambulatory Visit (INDEPENDENT_AMBULATORY_CARE_PROVIDER_SITE_OTHER): Payer: Federal, State, Local not specified - PPO | Admitting: Family Medicine

## 2013-08-24 VITALS — BP 124/80 | HR 76 | Temp 98.1°F | Resp 16 | Ht 70.0 in | Wt 306.5 lb

## 2013-08-24 DIAGNOSIS — Z23 Encounter for immunization: Secondary | ICD-10-CM

## 2013-08-24 DIAGNOSIS — E7800 Pure hypercholesterolemia, unspecified: Secondary | ICD-10-CM

## 2013-08-24 DIAGNOSIS — I82401 Acute embolism and thrombosis of unspecified deep veins of right lower extremity: Secondary | ICD-10-CM

## 2013-08-24 DIAGNOSIS — I82409 Acute embolism and thrombosis of unspecified deep veins of unspecified lower extremity: Secondary | ICD-10-CM

## 2013-08-24 DIAGNOSIS — Z0000 Encounter for general adult medical examination without abnormal findings: Secondary | ICD-10-CM

## 2013-08-24 DIAGNOSIS — N189 Chronic kidney disease, unspecified: Secondary | ICD-10-CM

## 2013-08-24 DIAGNOSIS — Z125 Encounter for screening for malignant neoplasm of prostate: Secondary | ICD-10-CM

## 2013-08-24 LAB — CBC
HCT: 38.6 % — ABNORMAL LOW (ref 39.0–52.0)
Hemoglobin: 12.8 g/dL — ABNORMAL LOW (ref 13.0–17.0)
MCH: 29.4 pg (ref 26.0–34.0)
MCHC: 33.2 g/dL (ref 30.0–36.0)
MCV: 88.5 fL (ref 78.0–100.0)
PLATELETS: 230 10*3/uL (ref 150–400)
RBC: 4.36 MIL/uL (ref 4.22–5.81)
RDW: 14.2 % (ref 11.5–15.5)
WBC: 4.2 10*3/uL (ref 4.0–10.5)

## 2013-08-24 LAB — COMPREHENSIVE METABOLIC PANEL
ALK PHOS: 76 U/L (ref 39–117)
ALT: 27 U/L (ref 0–53)
AST: 18 U/L (ref 0–37)
Albumin: 4.2 g/dL (ref 3.5–5.2)
BILIRUBIN TOTAL: 0.5 mg/dL (ref 0.2–1.2)
BUN: 13 mg/dL (ref 6–23)
CO2: 29 meq/L (ref 19–32)
Calcium: 9.4 mg/dL (ref 8.4–10.5)
Chloride: 104 mEq/L (ref 96–112)
Creat: 1.16 mg/dL (ref 0.50–1.35)
Glucose, Bld: 100 mg/dL — ABNORMAL HIGH (ref 70–99)
Potassium: 4.4 mEq/L (ref 3.5–5.3)
SODIUM: 139 meq/L (ref 135–145)
TOTAL PROTEIN: 6.9 g/dL (ref 6.0–8.3)

## 2013-08-24 LAB — LIPID PANEL
CHOL/HDL RATIO: 4.7 ratio
CHOLESTEROL: 191 mg/dL (ref 0–200)
HDL: 41 mg/dL (ref >39)
LDL CALC: 124 mg/dL — AB (ref 0–99)
TRIGLYCERIDES: 131 mg/dL (ref <150)
VLDL: 26 mg/dL (ref 0–40)

## 2013-08-24 NOTE — Progress Notes (Signed)
[facility] [address], [place] [address] [phone]  Date:  08/24/2013   Name:  Hector Brooks   DOB:  June 20, 1970   MRN:  [id number]  PCP:  No PCP Per Patient    Chief Complaint: Annual Exam   History of Present Illness:  Hector Brooks is a 43 y.o. very pleasant male patient who presents with the following:  He is here today for a CPE. He had knee surgery and developed a right leg DVT post-op.  This was in January.  He is now done with coumadin; finished with this around April.   He has chronic renal disease which is monitored by Dr. Lorrene Reid at [facility] . He is doing well and is seen just every 6 months now.   He also has HTN managed with medicaiton He did eat some chicken about 5 hours ago He may notice some occasional swelling in his right calf still, but this is not really new to him.    He is not sure of his last tetanus- likely more than 10 years ago His father did have prostate cancer- he was about 66 at age of dx  There are no active problems to display for this patient.   Past Medical History  Diagnosis Date  . Chronic kidney disease     Past Surgical History  Procedure Laterality Date  . Meniscus repair Right 12/14    History  Substance Use Topics  . Smoking status: Never Smoker   . Smokeless tobacco: Not on file  . Alcohol Use: No    History reviewed. No pertinent family history.  No Known Allergies  Medication list has been reviewed and updated.  Current Outpatient Prescriptions on File Prior to Visit  Medication Sig Dispense Refill  . amLODipine (NORVASC) 10 MG tablet Take 10 mg by mouth daily.      Marland Kitchen atorvastatin (LIPITOR) 80 MG tablet Take 80 mg by mouth at bedtime.       Marland Kitchen labetalol (NORMODYNE) 200 MG tablet Take 200 mg by mouth 2 (two) times daily.      Marland Kitchen lisinopril (PRINIVIL,ZESTRIL) 40 MG tablet Take 40 mg by mouth daily.      . Multiple Vitamin (MULTIVITAMIN) tablet Take 1 tablet by mouth daily.       Marland Kitchen torsemide (DEMADEX) 20 MG tablet Take 20 mg by mouth daily.      Marland Kitchen enoxaparin (LOVENOX) 30 MG/0.3ML injection Inject 1.4 mLs (140 mg total) into the skin every 12 (twelve) hours.  40 mL  0  . famotidine (PEPCID) 20 MG tablet Take 1 tablet (20 mg total) by mouth 2 (two) times daily.  30 tablet  0  . IBUPROFEN PO Take 2-3 tablets by mouth 2 (two) times daily as needed (pain).      Marland Kitchen warfarin (COUMADIN) 2.5 MG tablet TAKE 1 TABLET BY MOUTH EVERY DAY  90 tablet  0  . warfarin (COUMADIN) 5 MG tablet Take 1 tablet (5 mg total) by mouth daily.  30 tablet  0   No current facility-administered medications on file prior to visit.    Review of Systems:  As per HPI- otherwise negative.  Physical Examination: Filed Vitals:   08/24/13 1117  BP: 124/80  Pulse: 76  Temp: 98.1 F (36.7 C)  Resp: 16   Filed Vitals:   08/24/13 1117  Height: 5\' 10"  (1.778 m)  Weight: 306 lb 8 oz (139.027 kg)   Body mass index is 43.98  kg/(m^2). Ideal Body Weight: Weight in (lb) to have BMI = 25: 173.9  GEN: WDWN, NAD, Non-toxic, A & O x 3, obese, looks well HEENT: Atraumatic, Normocephalic. Neck supple. No masses, No LAD. Ears and Nose: No external deformity. CV: RRR, No M/G/R. No JVD. No thrill. No extra heart sounds. PULM: CTA B, no wheezes, crackles, rhonchi. No retractions. No resp. distress. No accessory muscle use. ABD: S, NT, ND, +BS. No rebound. No HSM. EXTR: No c/c/e NEURO Normal gait.  PSYCH: Normally interactive. Conversant. Not depressed or anxious appearing.  Calm demeanor.  Gu: normal exam, no masses, discharge or hernia Rectal: normal exam. No prostate enlargement or tenderness  Assessment and Plan: Physical exam - Plan: CBC, Comprehensive metabolic panel, Lipid panel, PSA  High cholesterol - Plan: Lipid panel  Screening for prostate cancer - Plan: PSA  Chronic kidney disease, unspecified stage - Plan: Comprehensive metabolic panel  Immunization due - Plan: Tdap vaccine greater  than or equal to 7yo IM  DVT, lower extremity, right - Plan: Lower Extremity Venous Duplex Right  Await labs as above Encouraged exercise for weight loss.  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    Signed Lamar Blinks, MD

## 2013-08-24 NOTE — Patient Instructions (Addendum)
Good to see you today!  I will be in touch with your labs.   Your blood pressure looks fine.  Please try to work on your weight- if you could get to 275 that would be a great start!  You probably get enough exercise at work, so try cutting down on your portion sizes by a few bites at each meal and be sure to avoid liquid calories (soda, sweet tea, juice).   We will set up an ultrasound to ensure your blood clot is resolved.  Let me know if you do not hear about this in the next week or so.

## 2013-08-25 ENCOUNTER — Encounter: Payer: Self-pay | Admitting: Family Medicine

## 2013-08-25 LAB — PSA: PSA: 0.91 ng/mL (ref <=4.00)

## 2013-08-31 ENCOUNTER — Other Ambulatory Visit (HOSPITAL_COMMUNITY): Payer: Self-pay | Admitting: Family Medicine

## 2013-08-31 ENCOUNTER — Other Ambulatory Visit: Payer: Self-pay | Admitting: Family Medicine

## 2013-08-31 ENCOUNTER — Ambulatory Visit (HOSPITAL_COMMUNITY)
Admission: RE | Admit: 2013-08-31 | Discharge: 2013-08-31 | Disposition: A | Discharge status: Home / Self Care | Payer: Federal, State, Local not specified - PPO | Source: Ambulatory Visit | Attending: Vascular Surgery | Admitting: Vascular Surgery

## 2013-08-31 ENCOUNTER — Telehealth: Payer: Self-pay | Admitting: Family Medicine

## 2013-08-31 DIAGNOSIS — I825Z1 Chronic embolism and thrombosis of unspecified deep veins of right distal lower extremity: Secondary | ICD-10-CM

## 2013-08-31 DIAGNOSIS — I82409 Acute embolism and thrombosis of unspecified deep veins of unspecified lower extremity: Secondary | ICD-10-CM | POA: Insufficient documentation

## 2013-08-31 DIAGNOSIS — I82401 Acute embolism and thrombosis of unspecified deep veins of right lower extremity: Secondary | ICD-10-CM

## 2013-08-31 MED ORDER — WARFARIN SODIUM 5 MG PO TABS: ORAL_TABLET | ORAL | Status: DC

## 2013-08-31 NOTE — Progress Notes (Signed)
Preliminary report phoned to [phone]) and given to Dr. Lorelei Pont

## 2013-08-31 NOTE — Telephone Encounter (Signed)
Called and discussed with pt.  It is not clear if this current clot requires anticoagulation as it is more superficial.  However the technologist was not able to see as well as she would like due to swelling, and the clot is now occlusive.  He would like to go back on coumadin in hopes of getting rid of this clot.   Noted that he has both 2.5 mg and 5mg  coumadin in his history.  He is not sure what strength/weekly dosage he was taking last.  Will have him start back on 1/2 of a 5mg  tablet (2.5mg ) daily and plan to recheck INR in a few days.  Will refer to vascular and vein specialists for further advice and management.

## 2013-08-31 NOTE — Telephone Encounter (Signed)
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  He had taken coumadin for a post-op  DVT that occurred in January of this year, finished his coumadin in April.

## 2013-09-01 NOTE — Telephone Encounter (Signed)
Persistent clot right leg, developed post- op 03/2013. Made appt with Dr. Trula Slade at 8:30 on July 8th. Please fax records but I will call pt and inform him.

## 2013-09-01 NOTE — Telephone Encounter (Signed)
Called [facility] and made him an appt.

## 2013-09-06 ENCOUNTER — Encounter: Payer: Self-pay | Admitting: Surgery

## 2013-09-06 ENCOUNTER — Other Ambulatory Visit (INDEPENDENT_AMBULATORY_CARE_PROVIDER_SITE_OTHER): Payer: Federal, State, Local not specified - PPO | Admitting: *Deleted

## 2013-09-06 DIAGNOSIS — I825Z9 Chronic embolism and thrombosis of unspecified deep veins of unspecified distal lower extremity: Secondary | ICD-10-CM

## 2013-09-06 DIAGNOSIS — I825Z1 Chronic embolism and thrombosis of unspecified deep veins of right distal lower extremity: Secondary | ICD-10-CM

## 2013-09-06 NOTE — Telephone Encounter (Signed)
Called him to discuss.  He is able to make the appt with Dr. Trula Slade tomorrow, and plans to recheck his INR today.  I will look for this result

## 2013-09-06 NOTE — Progress Notes (Signed)
Pt here for labs only. 

## 2013-09-07 ENCOUNTER — Ambulatory Visit (INDEPENDENT_AMBULATORY_CARE_PROVIDER_SITE_OTHER): Payer: Federal, State, Local not specified - PPO | Admitting: Surgery

## 2013-09-07 ENCOUNTER — Telehealth: Payer: Self-pay | Admitting: Family Medicine

## 2013-09-07 ENCOUNTER — Encounter: Payer: Self-pay | Admitting: Surgery

## 2013-09-07 VITALS — BP 126/83 | HR 76 | Ht 70.0 in | Wt 306.0 lb

## 2013-09-07 DIAGNOSIS — I743 Embolism and thrombosis of arteries of the lower extremities: Secondary | ICD-10-CM

## 2013-09-07 DIAGNOSIS — Z7901 Long term (current) use of anticoagulants: Secondary | ICD-10-CM

## 2013-09-07 HISTORY — DX: Embolism and thrombosis of arteries of the lower extremities: I74.3

## 2013-09-07 LAB — PROTIME-INR
INR: 0.99 (ref <1.50)
Prothrombin Time: 13.1 seconds (ref 11.6–15.2)

## 2013-09-07 NOTE — Telephone Encounter (Signed)
Called and spoke with him.  Per Dr. Trula Slade he will need to continue coumadin.  His INR is still low- he is taking 2.5 mg of coumadin  Results for orders placed in visit on 09/06/13  PROTIME-INR      Result Value Ref Range   Prothrombin Time 13.1  11.6 - 15.2 seconds   INR 0.99  <1.50   He will increase his coumadin to 5mg  a day, repeat INR in about one week

## 2013-09-07 NOTE — Progress Notes (Signed)
Patient name: Hector Brooks MRN: [id number] DOB: [birth year] Sex: male   Referred by: Dr. Lorelei Pont  Reason for referral:  Chief Complaint  Patient presents with  . New Evaluation    chronic R LE superficial clot    HISTORY OF PRESENT ILLNESS: This is a very pleasant 43 year old gentleman who was referred today for evaluation of a DVT.  The patient underwent right knee surgery in December.  After a period of an activity, he was found to have swelling in his right leg.  In January, an ultrasound identified a right leg DVT.  He was appropriately treated with 3 months of anticoagulation.  The patient reports persistent swelling in the right leg, particularly with standing.  The swelling is improved with leg elevation.  He is on his feet most of the day and notices that his leg becomes more swollen and shiny at the end of the day.  The patient has a history of a right medial malleolus region infection from a football many years ago.  This underwent debridement and has ultimately healed.  He does have discoloration of the right medial ankle but this is attributed to his football injury.  The patient suffers from chronic renal insufficiency.  He is evaluated by nephrology every 6 months.  He reports that this has been stable.  He is medically managed for hypertension.  He is on a statin for hypercholesterolemia.  Past Medical History  Diagnosis Date  . Chronic kidney disease     Past Surgical History  Procedure Laterality Date  . Meniscus repair Right 12/14    History   Social History  . Marital Status: Married    Spouse Name: N/A    Number of Children: N/A  . Years of Education: N/A   Occupational History  . Not on file.   Social History Main Topics  . Smoking status: Never Smoker   . Smokeless tobacco: Not on file  . Alcohol Use: No  . Drug Use: No  . Sexual Activity: Yes   Other Topics Concern  . Not on file   Social History Narrative  . No narrative on file     History reviewed. No pertinent family history.  Allergies as of 09/07/2013  . (No Known Allergies)    Current Outpatient Prescriptions on File Prior to Visit  Medication Sig Dispense Refill  . amLODipine (NORVASC) 10 MG tablet Take 10 mg by mouth daily.      Marland Kitchen atorvastatin (LIPITOR) 80 MG tablet Take 80 mg by mouth at bedtime.       . IBUPROFEN PO Take 2-3 tablets by mouth 2 (two) times daily as needed (pain).      Marland Kitchen labetalol (NORMODYNE) 200 MG tablet Take 200 mg by mouth 2 (two) times daily.      Marland Kitchen lisinopril (PRINIVIL,ZESTRIL) 40 MG tablet Take 40 mg by mouth daily.      . Multiple Vitamin (MULTIVITAMIN) tablet Take 1 tablet by mouth daily.      Marland Kitchen torsemide (DEMADEX) 20 MG tablet Take 20 mg by mouth daily.      Marland Kitchen warfarin (COUMADIN) 5 MG tablet TAKE 1/2 TABLET BY MOUTH DAILY AND ADJUST AS DIRECTED  45 tablet  3   No current facility-administered medications on file prior to visit.     REVIEW OF SYSTEMS: Cardiovascular: No chest pain, chest pressure, palpitations, orthopnea, or dyspnea on exertion. No claudication or rest pain, positive leg swelling.  Positive DVT Pulmonary: No productive cough, asthma  or wheezing. Neurologic: No weakness, paresthesias, aphasia, or amaurosis. No dizziness. Hematologic: No bleeding problems or clotting disorders. Musculoskeletal: No joint pain or joint swelling. Gastrointestinal: No blood in stool or hematemesis Genitourinary: No dysuria or hematuria. Psychiatric:: No history of major depression. Integumentary: No rashes or ulcers. Constitutional: No fever or chills.  PHYSICAL EXAMINATION: General: The patient appears their stated age.  Vital signs are BP 126/83  Pulse 76  Ht 5\' 10"  (1.778 m)  Wt 306 lb (138.801 kg)  BMI 43.91 kg/m2  SpO2 96% HEENT:  No gross abnormalities Pulmonary: Respirations are non-labored Musculoskeletal: There are no major deformities.   Neurologic: No focal weakness or paresthesias are detected, Skin:  Evidence of a healed ulcer on the medial right lower leg with brawny discoloration Psychiatric: The patient has normal affect. Cardiovascular: Faintly palpable pedal pulse on the right.  1-2+ pitting edema on the right  Diagnostic Studies: Ultrasound was ordered and reviewed.  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  The remainder of the right leg deep system is compressible.  No evidence of reflux within the superficial system   Assessment:  Right leg DVT Plan: The patient has completed a 3 month course of anticoagulation which I think is appropriate given that his DVT occurred in the setting of knee surgery and immobilization.  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  Ultrasound today shows chronic thrombus within the superficial femoral and popliteal vein.  There is no reflux within the superficial system.  I discussed with the patient the importance of keeping his leg elevated when possible, as well as wearing 20-30 compression stockings whenever possible, so as to avoid skin breakdown in the future.  He verbalized understanding of this.  He'll contact me with any further questions.  I gave him instructions for [employer].  He is going to get his stockings today.     Eldridge Abrahams, M.D. [facility] [place] Office: [phone] Pager:  [phone]

## 2014-01-28 ENCOUNTER — Other Ambulatory Visit: Payer: Self-pay | Admitting: Family Medicine

## 2014-02-16 ENCOUNTER — Telehealth: Payer: Self-pay

## 2014-02-16 NOTE — Telephone Encounter (Signed)
Dr Lorelei Pont, Dr Lorrene Reid called to discuss pt's coumadin with you. She saw pt today and her reported that he is taking coumadin and has been getting RFs from you, but hasn't had any blood drawn since July to check levels. Dr Lorrene Reid is concerned about this and feels that someone needs to be checking coumadin levels at least once a month. I see that the last lab results we have is 09/06/13. You had RFd the coumadin on 08/31/13 for #40 with 3 RFs. Pharm sent request on 09/01/13 for a full 90 day supply to be sent and I changed the quantity to #45 to match sig of 1/2 tab daily and adjust as directed, and kept RFs the same. That is the last Rx I see. Please call Dr Lorrene Reid back on her pager at [phone] (she will answer the pager herself). I advised her that you will not be in the office until tomorrow, but you often check your messages on day off.

## 2014-02-16 NOTE — Telephone Encounter (Signed)
I called Dr. Lorrene Reid back.  She reports that Mr. Schmuhl states that he was never told to recheck his coumadin and has been unsupervised on coumadin for 3 months. I did give Mr. Kearney coumadin with RF as is my usual practice and as per my phone note on 09/07/13 he was to come back in for a repeat INR in one week.  The pt did not do so.  I have tried to call the pt- he is not available at work and his phone does not accept incoming calls.  I will keep trying to reach him

## 2014-02-17 NOTE — Telephone Encounter (Signed)
Called pt at his home/ cell number and also at home/ cell number for his wife- both do not accept incoming calls. There is no answer at his work number. Called emergency contact number and got a number for his father- called her father who does not have his son's working number handy.   I will continue to try and reach him

## 2014-02-17 NOTE — Telephone Encounter (Signed)
Spoke with his father- he has the same number listed for his son and is not able to reach him either.  He will pass along my number and the message that we are trying to reach him if he is able to get in touch.   Called pt again and got same message Called work and he is not there

## 2014-02-18 NOTE — Telephone Encounter (Signed)
Called his phone- will still not accept incoming calls.  Called his work- spoke with his boss. 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  At this point I will send a certified letter.

## 2014-02-27 NOTE — Telephone Encounter (Signed)
Still will not accept incoming calls, busy signal at his job.  Called [facility] to make sure that his coumadin is canceled and they will not give any further refills.

## 2014-02-28 NOTE — Telephone Encounter (Signed)
Called work number- no answer.  Called his phone- still will not receive incoming calls

## 2014-03-08 ENCOUNTER — Telehealth: Payer: Self-pay | Admitting: Family Medicine

## 2014-03-08 NOTE — Telephone Encounter (Signed)
Called- phone does not accept incoming calls

## 2014-03-10 ENCOUNTER — Telehealth: Payer: Self-pay | Admitting: Family Medicine

## 2014-03-10 NOTE — Telephone Encounter (Signed)
Called- his phone, still does not take incoming calls.   Called his job- he is not there.  As above they do not take messages for their employees.  At this point I will cease trying to contact Hector Brooks. I have send him a certified letter which has not been returned

## 2014-07-21 ENCOUNTER — Ambulatory Visit (INDEPENDENT_AMBULATORY_CARE_PROVIDER_SITE_OTHER): Payer: Federal, State, Local not specified - PPO | Admitting: Family Medicine

## 2014-07-21 ENCOUNTER — Inpatient Hospital Stay (HOSPITAL_BASED_OUTPATIENT_CLINIC_OR_DEPARTMENT_OTHER): Admission: RE | Admit: 2014-07-21 | Payer: Federal, State, Local not specified - PPO | Source: Ambulatory Visit

## 2014-07-21 VITALS — BP 120/88 | HR 90 | Temp 98.1°F | Resp 16 | Ht 69.0 in | Wt 306.0 lb

## 2014-07-21 DIAGNOSIS — M25561 Pain in right knee: Secondary | ICD-10-CM | POA: Diagnosis not present

## 2014-07-21 NOTE — Progress Notes (Signed)
Subjective:    Patient ID: Hector Brooks, male    DOB: 13-Jun-1970, 44 y.o.   MRN: [id number] This chart was scribed for Robyn Haber, MD by Steva Colder, ED Scribe. The patient was seen in room 10 at 1:56 PM.   Chief Complaint  Patient presents with   Follow-up    Blood clot in Right calf/ Seen in July 2015 for this and never followed up    HPI  Hector Brooks is a 43 y.o. male who presents today complaining of f/u for blood clot that was in right calf onset 1 year. Pt was seen for this in 08/2013 and he never f/u for these symptoms. Pt had a PE after he had meniscus repair and was out for 6 weeks. Pt denies the clot going to his lung and that it stayed in his LLE. Pt denies having any issues and he has stopped taking the blood thinners. Pt still takes HTN, cholesterol, and fluid medications. He states that he is having associated symptoms of occasional calf swelling when not wearing compression sleeve. He states that he has tried compression sleeve with relief for his symptoms. He denies redness, warmth, wound, and any other symptoms.   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 Authorizing Provider  amLODipine (NORVASC) 10 MG tablet Take 10 mg by mouth daily.   Yes Historical Provider, MD  atorvastatin (LIPITOR) 80 MG tablet Take 80 mg by mouth at bedtime.    Yes Historical Provider, MD  labetalol (NORMODYNE) 200 MG tablet Take 200 mg by mouth 2 (two) times daily.   Yes Historical Provider, MD  lisinopril (PRINIVIL,ZESTRIL) 40 MG tablet Take 40 mg by mouth daily.   Yes Historical Provider, MD  torsemide (DEMADEX)  20 MG tablet Take 20 mg by mouth daily.   Yes Historical Provider, MD  IBUPROFEN PO Take 2-3 tablets by mouth 2 (two) times daily as needed (pain).    Historical Provider, MD  Multiple Vitamin (MULTIVITAMIN) tablet Take 1 tablet by mouth daily.    Historical Provider, MD      Review of Systems  Constitutional: Negative for fever and chills.  Musculoskeletal: Positive for joint swelling (occassional mild LLE). Negative for myalgias and arthralgias.  Skin: Negative for color change and wound.       Objective:   Physical Exam  Constitutional: He is oriented to person, place, and time. He appears well-developed and well-nourished. No distress.  Obese Body habitus  HENT:  Head: Normocephalic and atraumatic.  Eyes: EOM are normal.  Neck: Neck supple. No tracheal deviation present.  Cardiovascular: Normal rate, regular rhythm and normal heart sounds.  Exam reveals no gallop and no friction rub.   No murmur heard. Pulmonary/Chest: Effort normal and breath sounds normal. No respiratory distress. He has no wheezes. He has no rales.  Musculoskeletal: Normal range of motion.  Neurological: He is alert and oriented to person, place, and time.  Skin: Skin is warm and dry.  Post phlebitic hyperpigmented leathery right lower leg with compression sleeve on  Psychiatric: He has a normal mood and affect. His  behavior is normal.  Nursing note and vitals reviewed.        BP 120/88 mmHg   Pulse 90   Temp(Src) 98.1 F (36.7 C) (Oral)   Resp 16   Ht 5\' 9"  (1.753 m)   Wt 306 lb (138.801 kg)   BMI 45.17 kg/m2   SpO2 97%  Assessment & Plan:  DIAGNOSTIC STUDIES: Oxygen Saturation is 97% on RA, nl by my interpretation.    COORDINATION OF CARE: 2:03 PM-Discussed treatment plan which includes venous doppler of LLE with pt at bedside and pt agreed to plan.   This chart was scribed in my presence and reviewed by me personally.    ICD-9-CM ICD-10-CM   1. Pain in joint, lower leg, right 719.46 M25.561  US Venous Img Lower Unilateral Left     US Venous Img Lower Unilateral Right   Patient to schedule venous doppler  Signed, Robyn Haber, MD

## 2014-07-21 NOTE — Patient Instructions (Addendum)
We'll call you when the venous ultrasound is done.  If the clot is gone, then no further action is necessary.  Take [address] to [employer], Turn right on [address], Turn left of [address]

## 2014-07-24 ENCOUNTER — Other Ambulatory Visit: Payer: Self-pay | Admitting: Family Medicine

## 2014-07-24 ENCOUNTER — Ambulatory Visit (HOSPITAL_BASED_OUTPATIENT_CLINIC_OR_DEPARTMENT_OTHER)
Admission: RE | Admit: 2014-07-24 | Discharge: 2014-07-24 | Disposition: A | Discharge status: Home / Self Care | Payer: Federal, State, Local not specified - PPO | Source: Ambulatory Visit | Attending: Family Medicine | Admitting: Family Medicine

## 2014-07-24 DIAGNOSIS — M25561 Pain in right knee: Secondary | ICD-10-CM

## 2014-07-24 DIAGNOSIS — Z86718 Personal history of other venous thrombosis and embolism: Secondary | ICD-10-CM | POA: Diagnosis not present

## 2014-07-24 DIAGNOSIS — I82519 Chronic embolism and thrombosis of unspecified femoral vein: Secondary | ICD-10-CM

## 2014-07-24 DIAGNOSIS — M7989 Other specified soft tissue disorders: Secondary | ICD-10-CM | POA: Diagnosis present

## 2014-08-25 ENCOUNTER — Encounter: Payer: Self-pay | Admitting: Vascular Surgery

## 2014-08-29 ENCOUNTER — Encounter: Payer: Self-pay | Admitting: Vascular Surgery

## 2014-08-30 ENCOUNTER — Telehealth: Payer: Self-pay

## 2014-08-30 ENCOUNTER — Encounter: Payer: Federal, State, Local not specified - PPO | Admitting: Vascular Surgery

## 2014-09-25 ENCOUNTER — Encounter: Payer: Self-pay | Admitting: Vascular Surgery

## 2014-09-29 ENCOUNTER — Ambulatory Visit (INDEPENDENT_AMBULATORY_CARE_PROVIDER_SITE_OTHER): Payer: Federal, State, Local not specified - PPO | Admitting: Vascular Surgery

## 2014-09-29 ENCOUNTER — Encounter: Payer: Self-pay | Admitting: Vascular Surgery

## 2014-09-29 VITALS — BP 140/84 | HR 81 | Temp 98.2°F | Resp 16 | Ht 70.0 in | Wt 307.0 lb

## 2014-09-29 DIAGNOSIS — I82511 Chronic embolism and thrombosis of right femoral vein: Secondary | ICD-10-CM

## 2014-09-29 DIAGNOSIS — I87009 Postthrombotic syndrome without complications of unspecified extremity: Secondary | ICD-10-CM

## 2014-09-29 DIAGNOSIS — I82519 Chronic embolism and thrombosis of unspecified femoral vein: Secondary | ICD-10-CM | POA: Insufficient documentation

## 2014-09-29 HISTORY — DX: Postthrombotic syndrome without complications of unspecified extremity: I87.009

## 2014-09-29 NOTE — Progress Notes (Signed)
Established Venous Insufficiency   History of Present Illness  Hector Brooks is a 44 y.o. ([birth year]) male who presents with chief complaint: right leg swelling.  The patient's symptoms have not progressed.  The patient's symptoms are: swelling and some mild bursting sensation.  The patient is compliant with compression sleeve.  He has not been wearing a full compression stocking.  This patient was seen previous by Dr. 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  The patient notes for the last few months he has been more sx, but this has improved.  The patient's PMH, PSH, SH, and FamHx are unchanged from 09/07/13.  Current Outpatient Prescriptions  Medication Sig Dispense Refill  . amLODipine (NORVASC) 10 MG tablet Take 10 mg by mouth daily.    Marland Kitchen labetalol (NORMODYNE) 200 MG tablet Take 200 mg by mouth 2 (two) times daily.    Marland Kitchen lisinopril (PRINIVIL,ZESTRIL) 40 MG tablet Take 40 mg by mouth daily.    . Multiple Vitamin (MULTIVITAMIN) tablet Take 1 tablet by mouth daily.    Marland Kitchen torsemide (DEMADEX) 20 MG tablet Take 20 mg by mouth daily.    Marland Kitchen atorvastatin (LIPITOR) 80 MG tablet Take 80 mg by mouth at bedtime.     . IBUPROFEN PO Take 2-3 tablets by mouth 2 (two) times daily as needed (pain).     No current facility-administered medications for this visit.    No Known Allergies  On ROS today: swelling R leg, improvement in swelling with compression   Physical Examination  Filed Vitals:   09/29/14 1003  BP: 140/84  Pulse: 81  Temp: 98.2 F (36.8 C)  Resp: 16  Height: 5\' 10"  (1.778 m)  Weight: 307 lb (139.254 kg)  SpO2: 99%   Body mass index is 44.05 kg/(m^2).  General: A&O x 3, WD, Obese,   Pulmonary: Sym exp, good air movt, CTAB, no rales, rhonchi, & wheezing  Cardiac: RRR, Nl S1, S2, no Murmurs, rubs or gallops  Vascular: Vessel Right Left  Radial Palpable Palpable  Brachial Palpable Palpable  Carotid Palpable, without bruit Palpable,  without bruit  Aorta Not palpable N/A  Femoral Palpable Palpable  Popliteal Not palpable Not palpable  PT Palpable Palpable  DP Palpable Palpable   Gastrointestinal: soft, NTND, no G/R, no HSM, - masses, no CVAT B  Musculoskeletal: M/S 5/5 throughout , Extremities without ischemic changes , R LDS, healed prior incision  Neurologic: Pain and light touch intact in extremities , Motor exam as listed above  Non-Invasive Vascular Imaging  BLE Venous Duplex (Date: 07/24/14):  Chronic thormbus R femoral and popliteal veins   Medical Decision Making  Hector Brooks is a 44 y.o. 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  Based on the patient's vascular studies and examination, I have offered the patient: compression RLE.  I discussed with the patient the use of her 20-30 mm thigh high compression stockings.  Unfortunately, there is no other surgical interventions for chronic DVT.  This patient has no indication for venous valvular transplant, which is only done at a few vein centers in the country.  Thank you for allowing [place] to participate in this patient's care.   Adele Barthel, MD [facility] Office: [phone] Pager: [phone]  09/29/2014, 12:39  PM       

## 2014-10-17 NOTE — Telephone Encounter (Signed)
Close encounter 

## 2016-05-21 ENCOUNTER — Ambulatory Visit (INDEPENDENT_AMBULATORY_CARE_PROVIDER_SITE_OTHER): Payer: Federal, State, Local not specified - PPO | Admitting: Physician Assistant

## 2016-05-21 VITALS — BP 160/110 | HR 99 | Temp 98.4°F | Resp 18 | Ht 69.0 in | Wt 326.6 lb

## 2016-05-21 DIAGNOSIS — I10 Essential (primary) hypertension: Secondary | ICD-10-CM | POA: Diagnosis not present

## 2016-05-21 DIAGNOSIS — E785 Hyperlipidemia, unspecified: Secondary | ICD-10-CM

## 2016-05-21 DIAGNOSIS — M7989 Other specified soft tissue disorders: Secondary | ICD-10-CM

## 2016-05-21 MED ORDER — LISINOPRIL-HYDROCHLOROTHIAZIDE 20-25 MG PO TABS: 1.0000 | ORAL_TABLET | Freq: Every day | ORAL | 3 refills | Status: DC

## 2016-05-21 MED ORDER — ATORVASTATIN CALCIUM 40 MG PO TABS: 40.0000 mg | ORAL_TABLET | Freq: Every day | ORAL | 3 refills | Status: DC

## 2016-05-21 NOTE — Patient Instructions (Signed)
You are going to start one medication that has two agents in it. One is lisinopril 20mg  and the other is hctz 25mg . Take this medication daily. I would like you to follow up in 2 weeks for repeat bp. In the meantime, please try to take your bp outside the office and bring these recordings to your next visit. Your goal is <140/90 and >100/60.   I have also restarted your cholesterol medication, lipitor. Please take daily.   For lower leg swelling, you should be contacted in the next couple days with your appointment for you [place]. Please make sure you go and get this done. I will contact you with the results and we will go from there with further management. In the meantime, if you develop any new lower leg pain or worsening warmth or swelling please go to ED immediately.   Thank you for letting me participate in your health and well being.

## 2016-05-21 NOTE — Progress Notes (Signed)
Hector Brooks  MRN: [id number] DOB: Aug 15, 1970  Subjective:  Pt presents to clinic for chief complaint of medication refills. He has not taken any medication in over a year because he ran out and did not feel like coming back in. Pt is a poor historian.  HTN: Has had HTN since 2012. Used to be controlled on amlodipine 22m, lisinopril 474m and labetalol 20052maily but has not been on medicaiton for the past year. Patient is not checking blood pressure at home. Reports intermittent headache and lower leg swelling. Notes the swelling has been consistent since a knee injury in high school. Denies lightheadedness, dizziness, double vision, chest pain, shortness of breath, heart racing, palpitations, nausea, vomiting, abdominal pain, and hematuria.  Denies smoking or alcohol use.   History of chronic DVT: Developed a DVT post op 01/2013, started on 3 months of coumadin in 03/2013. Was seen again in our clinic in 08/2013 for CPE and US [place]aled chronic DVT, which was occlusive, in distal superficial femoral and popliteal veins. He was started back on 2.5mg21mumadin and referred to vein and vascular specialists. Per pt, the vein and vasular told him to start using compression stockings but did not have a follow up plan. In terms of swelling, pt notes his right leg is always more swollen than his left left. Denies pain, warmth, redness, SOB, and heart palpitations.   HLD: Was controlled on atorvastatin 80mg24m Kidney disease: He was followed by Dr. DunhaElisabeth MostarolAuburnwas on turosemide 20mg 36mlower leg swelling.   Diet: PB&J, breakfast sandwich, bag of chips, burgers, chicken, rice, mashed potatoes, sometimes salad, not a lot of vegetables. Drinks lemonade, gatorade, and water.   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    Patient Active Problem List   Diagnosis Date Noted  . DVT, femoral, chronic (HCC) 0[place]9/2016  . Post-phlebitic syndrome  09/29/2014  . 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 Multiple Vitamin (MULTIVITAMIN) tablet Take 1 tablet by mouth daily.    . amLOMarland Kitchenipine (NORVASC) 10 MG tablet Take 10 mg by mouth daily.    . atorMarland Kitchenastatin (LIPITOR) 80 MG tablet Take 80 mg by mouth at bedtime.     . IBUPROFEN PO Take 2-3 tablets by mouth 2 (two) times daily as needed (pain).    . labeMarland Kitchenalol (NORMODYNE) 200 MG tablet Take 200 mg by mouth 2 (two) times daily.    . lisiMarland Kitchenopril (PRINIVIL,ZESTRIL) 40 MG tablet Take 40 mg by mouth daily.    . torsMarland Kitchenmide (DEMADEX) 20 MG tablet Take 20 mg by mouth daily.     No current facility-administered medications on file prior to visit.     No Known Allergies  Social History   Social History  . Marital status: Married    Spouse name: N/A  . Number of children: N/A  . Years of education: N/A   Social History Main Topics  . Smoking status: Never Smoker  . Smokeless tobacco: Never Used  . Alcohol use No  . Drug use: No  . Sexual activity: Yes   Other Topics Concern  . None   Social History Narrative  . None    Past Surgical History:  Procedure Laterality Date  . MENISCUS REPAIR Right 12/14    Family History  Problem Relation Age of Onset  .  Cancer Father   . Diabetes Father   . Hyperlipidemia Sister   . Diabetes Maternal Grandfather   . Heart disease Maternal Grandfather     Review of Systems  Constitutional: Negative for chills, diaphoresis and fever.  HENT: Negative for congestion and sinus pressure.   Respiratory: Negative for cough and wheezing.   Skin: Negative for rash.       Objective:  BP (!) 160/110 (BP Location: Right Arm, Patient Position: Sitting, Cuff Size: Large)   Pulse 99   Temp 98.4 F (36.9 C) (Oral)   Resp 18   Ht _0  (1.753 m)   Wt (!) 326 lb 9.6 oz (148.1 kg)   SpO2 93%   BMI 48.23 kg/m   Physical Exam    Constitutional: He is oriented to person, place, and time and well-developed, well-nourished, and in no distress.  HENT:  Head: Normocephalic and atraumatic.  Eyes: Conjunctivae are normal.  Neck: Normal range of motion.  Cardiovascular: Normal rate, regular rhythm, normal heart sounds and intact distal pulses.   Pulmonary/Chest: Effort normal and breath sounds normal.  Musculoskeletal:       Right lower leg: He exhibits edema ( 2+).       Left lower leg: He exhibits edema (2+).  Negative Homan's sign in right leg.   Mild warmth noted with palpation of right lower extremity. No pain with palpation of right lower extremity. No erythema noted.   The right calf is approximately 4 cm larger than the left calf.   Neurological: He is alert and oriented to person, place, and time. Gait normal.  Skin: Skin is warm and dry.  Psychiatric: Affect normal.  Vitals reviewed.      Visual Acuity Screening   Right eye Left eye Both eyes  Without correction: _1  With correction:      Wt Readings from Last 3 Encounters:  05/21/16 (!) 326 lb 9.6 oz (148.1 kg)  09/29/14 (!) 307 lb (139.3 kg)  07/21/14 (!) 306 lb (138.8 kg)   BP Readings from Last 3 Encounters:  05/21/16 (!) 160/110  09/29/14 140/84  07/21/14 120/88    Assessment and Plan :  Today is my first day meeting this patient. This case was precepted with Dr. Mingo Amber.   1. Essential hypertension Uncontrolled, will reinitiate blood pressure medication. Instructed to follow up in 2 weeks for bp recheck.  - CMP14+EGFR - lisinopril-hydrochlorothiazide (PRINZIDE,ZESTORETIC) 20-25 MG tablet; Take 1 tablet by mouth daily.  Dispense: 90 tablet; Refill: 3  2. Leg swelling Pt notes the PE findings today are consistent with his baseline. However, due to his hx of chronic DVT and the fact that he never followed up with anyone, will order [place] today. If positive for DVT will consider referral to either vein and vascular or  hematology. Instructed that in the meantime if he develops lower leg pain, erythema or worsening swelling or warmth to seek care at the ED immediately.  - US Venous Img Lower Unilateral Right; Future  3. Hyperlipidemia, unspecified hyperlipidemia type - Lipid panel - atorvastatin (LIPITOR) 40 MG tablet; Take 1 tablet (40 mg total) by mouth daily.  Dispense: 90 tablet; Refill: 3  4. Morbid obesity -Discussed the importance of healthy diet and exercise as pt has gained 20lbs in the past 2 years. He voices his understanding.   [name] PA-C  [facility] 05/21/2016 12:14 PM

## 2016-05-22 LAB — LIPID PANEL
CHOLESTEROL TOTAL: 316 mg/dL — AB (ref 100–199)
Chol/HDL Ratio: 4.8 ratio units (ref 0.0–5.0)
HDL: 66 mg/dL (ref >39)
LDL CALC: 223 mg/dL — AB (ref 0–99)
Triglycerides: 133 mg/dL (ref 0–149)
VLDL Cholesterol Cal: 27 mg/dL (ref 5–40)

## 2016-05-22 LAB — CMP14+EGFR
ALBUMIN: 2.9 g/dL — AB (ref 3.5–5.5)
ALK PHOS: 62 IU/L (ref 39–117)
ALT: 25 IU/L (ref 0–44)
AST: 22 IU/L (ref 0–40)
Albumin/Globulin Ratio: 1.1 — ABNORMAL LOW (ref 1.2–2.2)
BILIRUBIN TOTAL: 0.3 mg/dL (ref 0.0–1.2)
BUN/Creatinine Ratio: 14 (ref 9–20)
BUN: 13 mg/dL (ref 6–24)
CHLORIDE: 104 mmol/L (ref 96–106)
CO2: 25 mmol/L (ref 18–29)
Calcium: 8.4 mg/dL — ABNORMAL LOW (ref 8.7–10.2)
Creatinine, Ser: 0.93 mg/dL (ref 0.76–1.27)
GFR calc non Af Amer: 99 mL/min/{1.73_m2} (ref >59)
GFR, EST AFRICAN AMERICAN: 115 mL/min/{1.73_m2} (ref >59)
GLOBULIN, TOTAL: 2.7 g/dL (ref 1.5–4.5)
Glucose: 99 mg/dL (ref 65–99)
Potassium: 4.8 mmol/L (ref 3.5–5.2)
SODIUM: 141 mmol/L (ref 134–144)
TOTAL PROTEIN: 5.6 g/dL — AB (ref 6.0–8.5)

## 2016-05-28 ENCOUNTER — Ambulatory Visit
Admission: RE | Admit: 2016-05-28 | Discharge: 2016-05-28 | Disposition: A | Discharge status: Home / Self Care | Payer: Federal, State, Local not specified - PPO | Source: Ambulatory Visit | Attending: Physician Assistant | Admitting: Physician Assistant

## 2016-05-28 DIAGNOSIS — M7989 Other specified soft tissue disorders: Secondary | ICD-10-CM

## 2016-05-30 ENCOUNTER — Encounter: Payer: Self-pay | Admitting: *Deleted

## 2016-06-09 ENCOUNTER — Encounter: Payer: Self-pay | Admitting: Physician Assistant

## 2016-06-09 ENCOUNTER — Ambulatory Visit (INDEPENDENT_AMBULATORY_CARE_PROVIDER_SITE_OTHER): Payer: Federal, State, Local not specified - PPO | Admitting: Physician Assistant

## 2016-06-09 VITALS — BP 131/87 | HR 87 | Temp 98.0°F | Resp 16 | Ht 69.0 in | Wt 320.0 lb

## 2016-06-09 DIAGNOSIS — I10 Essential (primary) hypertension: Secondary | ICD-10-CM | POA: Diagnosis not present

## 2016-06-09 DIAGNOSIS — I872 Venous insufficiency (chronic) (peripheral): Secondary | ICD-10-CM | POA: Diagnosis not present

## 2016-06-09 NOTE — Patient Instructions (Addendum)
Your blood pressure is great today. I would continue taking the medication as prescribed. In terms of lower leg swelling, the best thing you can do for venous insufficiency is use compression stockings and have weight loss, which you are already on your way to doing! Keep up the great work and make sure to get outside as soon as you can to start walking.  If you have any issues, please come back and see [place]. Otherwise, follow up with me in 6 months for blood work.     Chronic Venous Insufficiency Chronic venous insufficiency, also called venous stasis, is a condition that prevents blood from being pumped effectively through the veins in your legs. Blood may no longer be pumped effectively from the legs back to the heart. This condition can range from mild to severe. With proper treatment, you should be able to continue with an active life. What are the causes? Chronic venous insufficiency occurs when the vein walls become stretched, weakened, or damaged, or when valves within the vein are damaged. Some common causes of this include:  High blood pressure inside the veins (venous hypertension).  Increased blood pressure in the leg veins from long periods of sitting or standing.  A blood clot that blocks blood flow in a vein (deep vein thrombosis, DVT).  Inflammation of a vein (phlebitis) that causes a blood clot to form.  Tumors in the pelvis that cause blood to back up. What increases the risk? The following factors may make you more likely to develop this condition:  Having a family history of this condition.  Obesity.  Pregnancy.  Living without enough physical activity or exercise (sedentary lifestyle).  Smoking.  Having a job that requires long periods of standing or sitting in one place.  Being a certain age. Women in their 66s and 6s and men in their 6s are more likely to develop this condition. What are the signs or symptoms? Symptoms of this condition include:  Veins that  are enlarged, bulging, or twisted (varicose veins).  Skin breakdown or ulcers.  Reddened or discolored skin on the front of the leg.  Brown, smooth, tight, and painful skin just above the ankle, usually on the inside of the leg (lipodermatosclerosis).  Swelling. How is this diagnosed? This condition may be diagnosed based on:  Your medical history.  A physical exam.  Tests, such as:  A procedure that creates an image of a blood vessel and nearby organs and provides information about blood flow through the blood vessel (duplex ultrasound).  A procedure that tests blood flow (plethysmography).  A procedure to look at the veins using X-ray and dye (venogram). How is this treated? The goals of treatment are to help you return to an active life and to minimize pain or disability. Treatment depends on the severity of your condition, and it may include:  Wearing compression stockings. These can help relieve symptoms and help prevent your condition from getting worse. However, they do not cure the condition.  Sclerotherapy. This is a procedure involving an injection of a material that "dissolves" damaged veins.  Surgery. This may involve:  Removing a diseased vein (vein stripping).  Cutting off blood flow through the vein (laser ablation surgery).  Repairing a valve. Follow these instructions at home:  Wear compression stockings as told by your health care provider. These stockings help to prevent blood clots and reduce swelling in your legs.  Take over-the-counter and prescription medicines only as told by your health care provider.  Stay active by exercising, walking, or doing different activities. Ask your health care provider what activities are safe for you and how much exercise you need.  Drink enough fluid to keep your urine clear or pale yellow.  Do not use any products that contain nicotine or tobacco, such as cigarettes and e-cigarettes. If you need help quitting, ask  your health care provider.  Keep all follow-up visits as told by your health care provider. This is important. Contact a health care provider if:  You have redness, swelling, or more pain in the affected area.  You see a red streak or line that extends up or down from the affected area.  You have skin breakdown or a loss of skin in the affected area, even if the breakdown is small.  You get an injury in the affected area. Get help right away if:  You get an injury and an open wound in the affected area.  You have severe pain that does not get better with medicine.  You have sudden numbness or weakness in the foot or ankle below the affected area, or you have trouble moving your foot or ankle.  You have a fever and you have worse or persistent symptoms.  You have chest pain.  You have shortness of breath. Summary  Chronic venous insufficiency, also called venous stasis, is a condition that prevents blood from being pumped effectively through the veins in your legs.  Chronic venous insufficiency occurs when the vein walls become stretched, weakened, or damaged, or when valves within the vein are damaged.  Treatment for this condition depends on how severe your condition is, and it may involve wearing compression stockings or having a procedure.  Make sure you stay active by exercising, walking, or doing different activities. Ask your health care provider what activities are safe for you and how much exercise you need. This information is not intended to replace advice given to you by your health care provider. Make sure you discuss any questions you have with your health care provider. Document Released: 06/23/2006 Document Revised: 01/07/2016 Document Reviewed: 01/07/2016 Elsevier Interactive Patient Education  2017 [employer].  IF you received an x-ray today, you will receive an invoice from [facility]. Please contact [facility] at [phone] with  questions or concerns regarding your invoice.   IF you received labwork today, you will receive an invoice from [place]. Please contact [facility] at [phone] with questions or concerns regarding your invoice.   Our billing staff will not be able to assist you with questions regarding bills from these companies.  You will be contacted with the lab results as soon as they are available. The fastest way to get your results is to activate your My Chart account. Instructions are located on the last page of this paperwork. If you have not heard from [place] regarding the results in 2 weeks, please contact this office.

## 2016-06-09 NOTE — Progress Notes (Signed)
MRN: [id number] DOB: Dec 04, 1970  Subjective:   Hector Brooks is a 46 y.o. male presenting for follow up on Hypertension. Initially evaluated by me on 05/21/16 and had been out of medications for an entire year at that point. Labs were drawn and he was restarted on HTN medication. Avoided adding back amlodipine as patient struggles from chronic lower leg swelling.   Currently managed with lisinopril-hctz 20-25mg . Patient is not checking blood pressure at home. Reports his headache has completely resolved since his last visit. His lower leg swelling is improving but still present especially in his right lower leg. Denies lightheadedness, dizziness, chronic headache, double vision, chest pain, shortness of breath, heart racing, palpitations, nausea, vomiting, abdominal pain, and hematuria. Denies smoking.  Diet: He likes fried chicken, lunch meat, fruit cocktail cups, and any vegetable. Drinks water, fruit punch, and juice. Will drink mountain dew 3 times a week.   Exercise: Has not engaged in structured in a while but is about to start back since the weather is improving. Has last 6lbs since last visit on 05/21/16.   Alim has a current medication list which includes the following prescription(s): lisinopril, lisinopril-hydrochlorothiazide, multivitamin, amlodipine, atorvastatin, ibuprofen, labetalol, and torsemide. Also has No Known Allergies.  Hector Brooks  has a past medical history of Chronic kidney disease. Also  has a past surgical history that includes Meniscus repair (Right, 12/14).   Objective:   Vitals: BP 131/87 (BP Location: Right Arm, Patient Position: Sitting, Cuff Size: Large)   Pulse 87   Temp 98 F (36.7 C) (Oral)   Resp 16   Ht 5\' 9"  (1.753 m)   Wt (!) 320 lb (145.2 kg)   SpO2 94%   BMI 47.26 kg/m   Physical Exam  Constitutional: He is oriented to person, place, and time. He appears well-developed and well-nourished.  HENT:  Head: Normocephalic and atraumatic.    Eyes: Conjunctivae are normal.  Neck: Normal range of motion.  Cardiovascular: Normal rate, regular rhythm, normal heart sounds and intact distal pulses.   Pulmonary/Chest: Effort normal and breath sounds normal.  Musculoskeletal:       Right lower leg: He exhibits swelling (3+).       Left lower leg: He exhibits edema (1+).  Neurological: He is alert and oriented to person, place, and time.  Skin: Skin is warm and dry.  Hyperpigmentation of lower extremities bilaterally, more prominent on right medial lower extremity.   Psychiatric: He has a normal mood and affect.  Vitals reviewed.   BP Readings from Last 3 Encounters:  06/09/16 131/87  05/21/16 (!) 160/110  09/29/14 140/84   Wt Readings from Last 3 Encounters:  06/09/16 (!) 320 lb (145.2 kg)  05/21/16 (!) 326 lb 9.6 oz (148.1 kg)  09/29/14 (!) 307 lb (139.3 kg)    No results found for this or any previous visit (from the past 24 hour(s)).  Assessment and Plan :  1. Essential hypertension Well controlled on medication.  Continue current dose. Return in 6 months for follow up. Will repeat CMP and lipid panel at this visit.   2. Chronic venous insufficiency Improving with daily compression stockings. Instructed to continue compression daily. Start exercising daily.   3. Morbid obesity ([name]) Has lost 6 lbs since last visit. Discussed dietary and exercise lifestyle modifications. His goal is to lose 10-15 lbs before follow up in 6 months.    [name], PA-C  [facility] 06/09/2016 2:33 PM

## 2016-12-10 ENCOUNTER — Encounter: Payer: Federal, State, Local not specified - PPO | Admitting: Physician Assistant

## 2016-12-12 ENCOUNTER — Encounter: Payer: Self-pay | Admitting: Physician Assistant

## 2016-12-12 ENCOUNTER — Ambulatory Visit (INDEPENDENT_AMBULATORY_CARE_PROVIDER_SITE_OTHER): Payer: Federal, State, Local not specified - PPO | Admitting: Physician Assistant

## 2016-12-12 VITALS — BP 124/82 | HR 94 | Temp 98.2°F | Resp 16 | Ht 69.0 in | Wt 316.0 lb

## 2016-12-12 DIAGNOSIS — I10 Essential (primary) hypertension: Secondary | ICD-10-CM

## 2016-12-12 DIAGNOSIS — E785 Hyperlipidemia, unspecified: Secondary | ICD-10-CM | POA: Diagnosis not present

## 2016-12-12 NOTE — Progress Notes (Signed)
MRN: [id number] DOB: [birth year]  Subjective:   Hector Brooks is a 46 y.o. male presenting for follow up on Hypertension. Pt is fasting.   Currently managed with lisinopril-hctz 20-54m. Patient is not checking blood pressure at home. Notes he can tell a huge difference since he started the medication. Notes there is a night and day difference. Has chronic right lower leg swelling but no worsening leg swelling. Denies lightheadedness, dizziness, chronic headache, double vision, chest pain, shortness of breath, heart racing, palpitations, nausea, vomiting, abdominal pain, and hematuria. He is still eating some fried food and red meat. Trying to cut back on unhealthy food. He does like vegetables. He is limiting mountain dew, drinking more water. 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 He is looking to join [employer]. Has lost 10lbs since his initial visit. He currently has enough medication for at least 5 more months.  Denies smoking. Denies any other aggravating or relieving factors, no other questions or concerns.  JWhitakerhas a current medication list which includes the following prescription(s): atorvastatin, ibuprofen, lisinopril-hydrochlorothiazide, and multivitamin. Also has No Known Allergies.  [name] has a past medical history of Chronic kidney disease. Also  has a past surgical history that includes Meniscus repair (Right, 12/14).   Objective:   Vitals: BP 124/82   Pulse 94   Temp 98.2 F (36.8 C) (Oral)   Resp 16   Ht 5' 9" (1.753 m)   Wt (!) 316 lb (143.3 kg)   SpO2 96%   BMI 46.67 kg/m   Physical Exam  Constitutional: He is oriented to person, place, and time. He appears well-developed and well-nourished. No distress.  HENT:  Head: Normocephalic and atraumatic.  Mouth/Throat: Uvula is midline, oropharynx is clear and moist and mucous membranes  are normal.  Eyes: Conjunctivae are normal.  Neck: Normal range of motion.  Cardiovascular: Normal rate, regular rhythm, normal heart sounds and intact distal pulses.   Pulmonary/Chest: Effort normal and breath sounds normal. He has no decreased breath sounds. He has no wheezes. He has no rhonchi. He has no rales.  Musculoskeletal:       Right lower leg: He exhibits edema (2+ pitting edema to mid shin ).       Left lower leg: He exhibits no edema.  Neurological: He is alert and oriented to person, place, and time.  Skin: Skin is warm and dry.  Hyperpigmentation of lower extremities bilaterally, more prominent on right medial lower extremity.    Psychiatric: He has a normal mood and affect.  Vitals reviewed.    BP Readings from Last 3 Encounters:  12/12/16 124/82  06/09/16 131/87  05/21/16 (!) 160/110   Wt Readings from Last 3 Encounters:  12/12/16 (!) 316 lb (143.3 kg)  06/09/16 (!) 320 lb (145.2 kg)  05/21/16 (!) 326 lb 9.6 oz (148.1 kg)    No results found for this or any previous visit (from the past 24 hour(s)).  Assessment and Plan :  1. Essential hypertension Well controlled. Congratulated pt on weight loss. Encouraged to continue healthy lifestyle behaviors. Continue current dose of medication. Plan to follow up in 6 months. Encouraged to contact our office if he runs out of medication before his follow up visit so we can provide him with courtesy refills until his appointment.  - CMP14+EGFR  2. Hyperlipidemia, unspecified hyperlipidemia type  Labs pending.  - Lipid panel  [name], PA-C  [facility] 12/12/2016 10:10 PM

## 2016-12-12 NOTE — Patient Instructions (Addendum)
Your blood pressure is very well controlled. You have also lost weight, which is great. I recommend starting engaging in structured exercise and making sure you are avoiding excess fried food, red meat, and salty foods. Please follow up in 6 months for reevaluation. You have enough medication to last you until March 2019. Call  if you run out of medication before your follow up appointment. Thank you for letting me participate in your health and well being.   DASH Eating Plan DASH stands for "Dietary Approaches to Stop Hypertension." The DASH eating plan is a healthy eating plan that has been shown to reduce high blood pressure (hypertension). It may also reduce your risk for type 2 diabetes, heart disease, and stroke. The DASH eating plan may also help with weight loss. What are tips for following this plan? General guidelines  Avoid eating more than 2,300 mg (milligrams) of salt (sodium) a day. If you have hypertension, you may need to reduce your sodium intake to 1,500 mg a day.  Limit alcohol intake to no more than 1 drink a day for nonpregnant women and 2 drinks a day for men. One drink equals 12 oz of beer, 5 oz of wine, or 1 oz of hard liquor.  Work with your health care provider to maintain a healthy body weight or to lose weight. Ask what an ideal weight is for you.  Get at least 30 minutes of exercise that causes your heart to beat faster (aerobic exercise) most days of the week. Activities may include walking, swimming, or biking.  Work with your health care provider or diet and nutrition specialist (dietitian) to adjust your eating plan to your individual calorie needs. Reading food labels  Check food labels for the amount of sodium per serving. Choose foods with less than 5 percent of the Daily Value of sodium. Generally, foods with less than 300 mg of sodium per serving fit into this eating plan.  To find whole grains, look for the word "whole" as the first word in the ingredient  list. Shopping  Buy products labeled as "low-sodium" or "no salt added."  Buy fresh foods. Avoid canned foods and premade or frozen meals. Cooking  Avoid adding salt when cooking. Use salt-free seasonings or herbs instead of table salt or sea salt. Check with your health care provider or pharmacist before using salt substitutes.  Do not fry foods. Cook foods using healthy methods such as baking, boiling, grilling, and broiling instead.  Cook with heart-healthy oils, such as olive, canola, soybean, or sunflower oil. Meal planning   Eat a balanced diet that includes: ? 5 or more servings of fruits and vegetables each day. At each meal, try to fill half of your plate with fruits and vegetables. ? Up to 6-8 servings of whole grains each day. ? Less than 6 oz of lean meat, poultry, or fish each day. A 3-oz serving of meat is about the same size as a deck of cards. One egg equals 1 oz. ? 2 servings of low-fat dairy each day. ? A serving of nuts, seeds, or beans 5 times each week. ? Heart-healthy fats. Healthy fats called Omega-3 fatty acids are found in foods such as flaxseeds and coldwater fish, like sardines, salmon, and mackerel.  Limit how much you eat of the following: ? Canned or prepackaged foods. ? Food that is high in trans fat, such as fried foods. ? Food that is high in saturated fat, such as fatty meat. ? Sweets, desserts,  sugary drinks, and other foods with added sugar. ? Full-fat dairy products.  Do not salt foods before eating.  Try to eat at least 2 vegetarian meals each week.  Eat more home-cooked food and less restaurant, buffet, and fast food.  When eating at a restaurant, ask that your food be prepared with less salt or no salt, if possible. What foods are recommended? The items listed may not be a complete list. Talk with your dietitian about what dietary choices are best for you. Grains Whole-grain or whole-wheat bread. Whole-grain or whole-wheat pasta. Brown  rice. [place]. Bulgur. Whole-grain and low-sodium cereals. Pita bread. Low-fat, low-sodium crackers. Whole-wheat flour tortillas. Vegetables Fresh or frozen vegetables (raw, steamed, roasted, or grilled). Low-sodium or reduced-sodium tomato and vegetable juice. Low-sodium or reduced-sodium tomato sauce and tomato paste. Low-sodium or reduced-sodium canned vegetables. Fruits All fresh, dried, or frozen fruit. Canned fruit in natural juice (without added sugar). Meat and other protein foods Skinless chicken or [place]. Ground chicken or [place]. Pork with fat trimmed off. Fish and seafood. Egg whites. Dried beans, peas, or lentils. Unsalted nuts, nut butters, and seeds. Unsalted canned beans. Lean cuts of beef with fat trimmed off. Low-sodium, lean deli meat. Dairy Low-fat (1%) or fat-free (skim) milk. Fat-free, low-fat, or reduced-fat cheeses. Nonfat, low-sodium ricotta or cottage cheese. Low-fat or nonfat yogurt. Low-fat, low-sodium cheese. Fats and oils Soft margarine without trans fats. Vegetable oil. Low-fat, reduced-fat, or light mayonnaise and salad dressings (reduced-sodium). Canola, safflower, olive, soybean, and sunflower oils. Avocado. Seasoning and other foods Herbs. Spices. Seasoning mixes without salt. Unsalted popcorn and pretzels. Fat-free sweets. What foods are not recommended? The items listed may not be a complete list. Talk with your dietitian about what dietary choices are best for you. Grains Baked goods made with fat, such as croissants, muffins, or some breads. Dry pasta or rice meal packs. Vegetables Creamed or fried vegetables. Vegetables in a cheese sauce. Regular canned vegetables (not low-sodium or reduced-sodium). Regular canned tomato sauce and paste (not low-sodium or reduced-sodium). Regular tomato and vegetable juice (not low-sodium or reduced-sodium). Angie Fava. Olives. Fruits Canned fruit in a light or heavy syrup. Fried fruit. Fruit in cream or butter  sauce. Meat and other protein foods Fatty cuts of meat. Ribs. Fried meat. Berniece Salines. Sausage. Bologna and other processed lunch meats. Salami. Fatback. Hotdogs. Bratwurst. Salted nuts and seeds. Canned beans with added salt. Canned or smoked fish. Whole eggs or egg yolks. Chicken or [place] with skin. Dairy Whole or 2% milk, cream, and half-and-half. Whole or full-fat cream cheese. Whole-fat or sweetened yogurt. Full-fat cheese. Nondairy creamers. Whipped toppings. Processed cheese and cheese spreads. Fats and oils Butter. Stick margarine. Lard. Shortening. Ghee. Bacon fat. Tropical oils, such as coconut, palm kernel, or palm oil. Seasoning and other foods Salted popcorn and pretzels. Onion salt, garlic salt, seasoned salt, table salt, and sea salt. Worcestershire sauce. Tartar sauce. Barbecue sauce. Teriyaki sauce. Soy sauce, including reduced-sodium. Steak sauce. Canned and packaged gravies. Fish sauce. Oyster sauce. Cocktail sauce. Horseradish that you find on the shelf. Ketchup. Mustard. Meat flavorings and tenderizers. Bouillon cubes. Hot sauce and Tabasco sauce. Premade or packaged marinades. Premade or packaged taco seasonings. Relishes. Regular salad dressings. Where to find more information:  National Heart, Lung, and [place]: https://wilson-eaton.com/  American Heart Association: www.heart.org Summary  The DASH eating plan is a healthy eating plan that has been shown to reduce high blood pressure (hypertension). It may also reduce your risk for type 2 diabetes, heart disease,  and stroke.  With the DASH eating plan, you should limit salt (sodium) intake to 2,300 mg a day. If you have hypertension, you may need to reduce your sodium intake to 1,500 mg a day.  When on the DASH eating plan, aim to eat more fresh fruits and vegetables, whole grains, lean proteins, low-fat dairy, and heart-healthy fats.  Work with your health care provider or diet and nutrition specialist (dietitian) to adjust  your eating plan to your individual calorie needs. This information is not intended to replace advice given to you by your health care provider. Make sure you discuss any questions you have with your health care provider. Document Released: 02/06/2011 Document Revised: 02/11/2016 Document Reviewed: 02/11/2016 Elsevier Interactive Patient Education  2017 [employer].  IF you received an x-ray today, you will receive an invoice from [facility]. Please contact [facility] at [phone] with questions or concerns regarding your invoice.   IF you received labwork today, you will receive an invoice from [facility]. Please contact [facility] at [phone] with questions or concerns regarding your invoice.   Our billing staff will not be able to assist you with questions regarding bills from these companies.  You will be contacted with the lab results as soon as they are available. The fastest way to get your results is to activate your My Chart account. Instructions are located on the last page of this paperwork. If you have not heard from [place] regarding the results in 2 weeks, please contact this office.

## 2016-12-14 LAB — CMP14+EGFR
A/G RATIO: 1.1 — AB (ref 1.2–2.2)
ALBUMIN: 3.2 g/dL — AB (ref 3.5–5.5)
ALT: 27 IU/L (ref 0–44)
AST: 21 IU/L (ref 0–40)
Alkaline Phosphatase: 71 IU/L (ref 39–117)
BILIRUBIN TOTAL: 0.4 mg/dL (ref 0.0–1.2)
BUN / CREAT RATIO: 13 (ref 9–20)
BUN: 15 mg/dL (ref 6–24)
CHLORIDE: 106 mmol/L (ref 96–106)
CO2: 24 mmol/L (ref 20–29)
Calcium: 9 mg/dL (ref 8.7–10.2)
Creatinine, Ser: 1.14 mg/dL (ref 0.76–1.27)
GFR calc non Af Amer: 78 mL/min/{1.73_m2} (ref >59)
GFR, EST AFRICAN AMERICAN: 90 mL/min/{1.73_m2} (ref >59)
Globulin, Total: 2.8 g/dL (ref 1.5–4.5)
Glucose: 122 mg/dL — ABNORMAL HIGH (ref 65–99)
POTASSIUM: 4.6 mmol/L (ref 3.5–5.2)
Sodium: 145 mmol/L — ABNORMAL HIGH (ref 134–144)
TOTAL PROTEIN: 6 g/dL (ref 6.0–8.5)

## 2016-12-14 LAB — LIPID PANEL
CHOLESTEROL TOTAL: 210 mg/dL — AB (ref 100–199)
Chol/HDL Ratio: 4.3 ratio (ref 0.0–5.0)
HDL: 49 mg/dL (ref >39)
LDL Calculated: 136 mg/dL — ABNORMAL HIGH (ref 0–99)
Triglycerides: 125 mg/dL (ref 0–149)
VLDL CHOLESTEROL CAL: 25 mg/dL (ref 5–40)

## 2017-07-02 ENCOUNTER — Other Ambulatory Visit: Payer: Self-pay

## 2017-07-02 ENCOUNTER — Encounter: Payer: Self-pay | Admitting: Family Medicine

## 2017-07-02 ENCOUNTER — Ambulatory Visit: Payer: Federal, State, Local not specified - PPO | Admitting: Family Medicine

## 2017-07-02 VITALS — BP 160/100 | HR 110 | Temp 98.3°F | Resp 18 | Ht 69.0 in | Wt 331.4 lb

## 2017-07-02 DIAGNOSIS — I82511 Chronic embolism and thrombosis of right femoral vein: Secondary | ICD-10-CM

## 2017-07-02 DIAGNOSIS — N062 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: Secondary | ICD-10-CM

## 2017-07-02 DIAGNOSIS — E440 Moderate protein-calorie malnutrition: Secondary | ICD-10-CM | POA: Diagnosis not present

## 2017-07-02 DIAGNOSIS — D649 Anemia, unspecified: Secondary | ICD-10-CM

## 2017-07-02 DIAGNOSIS — E7800 Pure hypercholesterolemia, unspecified: Secondary | ICD-10-CM | POA: Diagnosis not present

## 2017-07-02 DIAGNOSIS — Z113 Encounter for screening for infections with a predominantly sexual mode of transmission: Secondary | ICD-10-CM | POA: Diagnosis not present

## 2017-07-02 DIAGNOSIS — R601 Generalized edema: Secondary | ICD-10-CM

## 2017-07-02 DIAGNOSIS — Z86718 Personal history of other venous thrombosis and embolism: Secondary | ICD-10-CM

## 2017-07-02 DIAGNOSIS — I10 Essential (primary) hypertension: Secondary | ICD-10-CM

## 2017-07-02 DIAGNOSIS — R7309 Other abnormal glucose: Secondary | ICD-10-CM

## 2017-07-02 DIAGNOSIS — E8779 Other fluid overload: Secondary | ICD-10-CM

## 2017-07-02 DIAGNOSIS — Z114 Encounter for screening for human immunodeficiency virus [HIV]: Secondary | ICD-10-CM

## 2017-07-02 LAB — POCT URINALYSIS DIP (MANUAL ENTRY)
Bilirubin, UA: NEGATIVE
Glucose, UA: 100 mg/dL — AB
Ketones, POC UA: NEGATIVE mg/dL
Leukocytes, UA: NEGATIVE
NITRITE UA: NEGATIVE
UROBILINOGEN UA: 0.2 U/dL
pH, UA: 7 (ref 5.0–8.0)

## 2017-07-02 LAB — POCT CBC
GRANULOCYTE PERCENT: 62.6 % (ref 37–80)
HCT, POC: 35.9 % — AB (ref 43.5–53.7)
HEMOGLOBIN: 12.1 g/dL — AB (ref 14.1–18.1)
Lymph, poc: 1.7 (ref 0.6–3.4)
MCH: 29.4 pg (ref 27–31.2)
MCHC: 33.7 g/dL (ref 31.8–35.4)
MCV: 87.1 fL (ref 80–97)
MID (cbc): 0.3 (ref 0–0.9)
MPV: 6.5 fL (ref 0–99.8)
PLATELET COUNT, POC: 230 10*3/uL (ref 142–424)
POC Granulocyte: 3.3 (ref 2–6.9)
POC LYMPH PERCENT: 32.6 %L (ref 10–50)
POC MID %: 4.8 %M (ref 0–12)
RBC: 4.12 M/uL — AB (ref 4.69–6.13)
RDW, POC: 14.1 %
WBC: 5.3 10*3/uL (ref 4.6–10.2)

## 2017-07-02 LAB — POC MICROSCOPIC URINALYSIS (UMFC): MUCUS RE: ABSENT

## 2017-07-02 LAB — POCT GLYCOSYLATED HEMOGLOBIN (HGB A1C): HEMOGLOBIN A1C: 5.9

## 2017-07-02 MED ORDER — POTASSIUM CHLORIDE CRYS ER 20 MEQ PO TBCR: 20.0000 meq | EXTENDED_RELEASE_TABLET | Freq: Every day | ORAL | 0 refills | Status: DC

## 2017-07-02 MED ORDER — LABETALOL HCL 200 MG PO TABS: 200.0000 mg | ORAL_TABLET | Freq: Two times a day (BID) | ORAL | 0 refills | Status: DC

## 2017-07-02 MED ORDER — FUROSEMIDE 40 MG PO TABS: ORAL_TABLET | ORAL | 0 refills | Status: DC

## 2017-07-02 NOTE — Patient Instructions (Addendum)
DO NOT EAT MORE THAN 2 G of SALT A DAY - ABSOLUTE MAX.  IF you received an x-ray today, you will receive an invoice from [facility]. Please contact [facility] at [phone] with questions or concerns regarding your invoice.   IF you received labwork today, you will receive an invoice from [place]. Please contact [facility] at [phone] with questions or concerns regarding your invoice.   Our billing staff will not be able to assist you with questions regarding bills from these companies.  You will be contacted with the lab results as soon as they are available. The fastest way to get your results is to activate your My Chart account. Instructions are located on the last page of this paperwork. If you have not heard from [place] regarding the results in 2 weeks, please contact this office.     Nephrotic Syndrome Nephrotic syndrome is a set of findings that show there is a problem with the kidneys. These findings include:  High levels of protein in the urine (proteinuria).  High blood pressure (hypertension).  Low levels of the protein albumin in the blood (hypoalbuminemia).  High levels of cholesterol (hyperlipidemia) and triglycerides (hypertriglyceridemia) in the blood.  Swelling (edema) of the face, abdomen, arms, and legs.  Nephrotic syndrome occurs when the kidneys' filters (glomeruli) are damaged. Glomeruli remove toxins and waste products from the bloodstream. As a result of damaged glomeruli, essential products such as proteins may also be removed from the bloodstream. Nephrotic syndrome results from the loss of proteins and other substances that the body needs. Nephrotic syndrome may increase your risk of further kidney damage and of health problems such as blood clots and infections. What are the causes? This condition may be caused by:  A kidney disease that damages the glomeruli, such as: ? Minimal change disease. ? Focal segmental  glomerulosclerosis. ? Membranous nephropathy. ? Glomerulonephritis.  A condition or disease that affects other parts of the body (systemic), such as: ? Diabetes. ? Autoimmune diseases, such as lupus. ? Amyloidosis. ? Multiple myeloma. ? Some types of cancers. ? An infection, such as hepatitis C.  Certain medicines, such as: ? Nonsteroidal anti-inflammatory drugs (NSAIDs). ? Some anticancer drugs.  In some cases, the cause may not be known. What are the signs or symptoms? Symptoms of this condition include:  Edema.  Foamy urine.  Unexplained weight gain.  Loss of appetite.  In some cases, there are no noticeable symptoms. How is this diagnosed? This condition is usually diagnosed with a dipstick urine test followed by a 24-hour urine test in which you collect all of the urine that you produce over a 24-hour period. If your test shows that you have this condition, more tests may be needed to find the cause. These may include blood, urine, imaging, or kidney biopsy tests. How is this treated? Treatment for this condition may include medicines to control symptoms or to prevent complications from occurring. These medicines may:  Decrease inflammation in the kidneys.  Lower blood pressure.  Lower cholesterol.  Reduce the blood's ability to clot.  Help control edema.  Further treatment will depend on the cause of the condition. Your health care provider will discuss treatment options with you. Follow these instructions at home:  Follow diet instructions from your health care provider. This may include changes to help manage edema or hypertension, such as limiting your intake of salt or fluids.  Take over-the-counter and prescription medicines only as told by your health care provider. ? Do not  take any new over-the-counter medicines or nutritional supplements unless approved by your health care provider. Many medicines can make this condition worse. Doses may need to be  adjusted.  Keep all follow-up visits as told by your health care provider. This is important. Contact a health care provider if:  Your symptoms do not go away as expected or you develop new symptoms.  You continue to gain weight.  You have increased swelling of the feet, ankles, or legs. Get help right away if:  You stop producing urine.  You have prolonged bleeding.  You have shortness of breath.  You have a severe headache.  You have severe weakness. This information is not intended to replace advice given to you by your health care provider. Make sure you discuss any questions you have with your health care provider. Document Released: 01/11/2004 Document Revised: 02/11/2016 Document Reviewed: 02/11/2016 Elsevier Interactive Patient Education  [employer].

## 2017-07-02 NOTE — Progress Notes (Signed)
Subjective:  By signing my name below, I, Hector Brooks, attest that this documentation has been prepared under the direction and in the presence of Delman Cheadle, MD. Electronically Signed: Moises Brooks, [place]. 07/02/2017 , 11:50 AM .  Patient was seen in Room 1 .   Patient ID: Hector Brooks, male    DOB: [birth year], 47 y.o.   MRN: [id number] Chief Complaint  Patient presents with  . Fluid Retention    x4 days, stomach, Pt states he started feeling heavy over the weekend. Pt states he feels full and heavy. Pt states when presses on his stomach it feels like fluid.  . Medication Refill    Lisinopril-Hydrochlorothiazide, Pt hasn't had med in over a week   HPI Hector Brooks is a 47 y.o. male who presents to Primary Care at [facility] complaining of abdominal fluid retention. Patient was on Warfarin in 2015 with some difficulty. He has been seen at [facility] by Dr. Lorrene Reid in 2013 for proteinuria, and membranous glomerulopathy stage 2. He had in-renal biopsy done at [facility] in 2012. Improved for a while, and some point it was gone; hasn't seen since.   Patient states he's been out of his BP medication for about 1-2 weeks now. He noticed abdominal retention recently, similar to previous nephrotic symptoms, as well as leg swelling about a month now. He hasn't been sleeping in a recliner or with more pillows. He denies history of liver issues. His diet hasn't changed; eating meats daily, eggs twice a week and cereal with milk. He denies urinary symptoms, except for bubbly urine. He denies constipation or shortness of breath. His DVT was treated by Dr. Trula Slade. He takes [facility] powder for occasional headaches, about twice a month. He does have compression socks at home.   His nephropathy was previously treated only through outpatient, and didn't need to be hospitalized. He never had fluids drawn out from his abdomen. He was on Cytoxan with complete remission June 2013. He had stopped Cytoxan  with 17.5mg  proteinuria and nephrotic symptoms. After remission, proteinuria 59 to 402 mg with dip negative.   Past Medical History:  Diagnosis Date  . Chronic kidney disease    Past Surgical History:  Procedure Laterality Date  . MENISCUS REPAIR Right 12/14   Prior to Admission medications   Medication Sig Start Date End Date Taking? Authorizing Provider  atorvastatin (LIPITOR) 40 MG tablet Take 1 tablet (40 mg total) by mouth daily. 05/21/16  Yes [name] D, PA-C  IBUPROFEN PO Take 2-3 tablets by mouth 2 (two) times daily as needed (pain).   Yes [provider]  lisinopril-hydrochlorothiazide (PRINZIDE,ZESTORETIC) 20-25 MG tablet Take 1 tablet by mouth daily. 05/21/16  Yes Timmothy Euler, [place] D, PA-C  Multiple Vitamin (MULTIVITAMIN) tablet Take 1 tablet by mouth daily.   Yes [provider]   No Known Allergies Family History  Problem Relation Age of Onset  . Cancer Father   . Diabetes Father   . Hyperlipidemia Sister   . Diabetes Maternal Grandfather   . Heart disease Maternal Grandfather    Social History   Socioeconomic History  . Marital status: Married    Spouse name: Not on file  . Number of children: Not on file  . Years of education: Not on file  . Highest education level: Not on file  Occupational History  . Not on file  Social Needs  . Financial resource strain: Not on file  . Food insecurity:    Worry: Not  on file    Inability: Not on file  . Transportation needs:    Medical: Not on file    Non-medical: Not on file  Tobacco Use  . Smoking status: Never Smoker  . Smokeless tobacco: Never Used  Substance and Sexual Activity  . Alcohol use: No    Alcohol/week: 0.0 oz  . Drug use: No  . Sexual activity: Yes  Lifestyle  . Physical activity:    Days per week: Not on file    Minutes per session: Not on file  . Stress: Not on file  Relationships  . Social connections:    Talks on phone: Not on file    Gets together: Not on file      Attends religious service: Not on file    Active member of club or organization: Not on file    Attends meetings of clubs or organizations: Not on file    Relationship status: Not on file  Other Topics Concern  . Not on file  Social History Narrative  . Not on file   Depression screen [facility] 2/9 07/02/2017 12/12/2016 06/09/2016 05/21/2016  Decreased Interest 0 0 0 0  Down, Depressed, Hopeless 0 0 0 0  PHQ - 2 Score 0 0 0 0    Review of Systems  Constitutional: Negative for fatigue and unexpected weight change.  Eyes: Negative for visual disturbance.  Respiratory: Negative for cough, chest tightness and shortness of breath.   Cardiovascular: Positive for leg swelling. Negative for chest pain and palpitations.  Gastrointestinal: Positive for abdominal distention. Negative for abdominal pain, Brooks in stool and constipation.  Neurological: Negative for dizziness, light-headedness and headaches.       Objective:   Physical Exam  Constitutional: He is oriented to person, place, and time. He appears well-developed and well-nourished. No distress.  HENT:  Head: Normocephalic and atraumatic.  Eyes: Pupils are equal, round, and reactive to light. EOM are normal.  Neck: Neck supple.  Cardiovascular: Regular rhythm, S1 normal and S2 normal. Tachycardia present.  No murmur heard. Pulmonary/Chest: Effort normal. No respiratory distress.  Abdominal: He exhibits distension (moderate). Bowel sounds are decreased.  Musculoskeletal: Normal range of motion. He exhibits edema (2+ bilateral pitting edema).  Neurological: He is alert and oriented to person, place, and time.  Skin: Skin is warm and dry.  Psychiatric: He has a normal mood and affect. His behavior is normal.  Nursing note and vitals reviewed.   BP (!) 150/100 (BP Location: Left Arm, Patient Position: Sitting, Cuff Size: Large)   Pulse (!) 110   Temp 98.3 F (36.8 C) (Oral)   Resp 18   Ht 5\' 9"  (1.753 m)   Wt (!) 331 lb 6.4 oz  (150.3 kg)   SpO2 98%   BMI 48.94 kg/m   EKG: sinus tachycardia rate 104, no acute ischemic changes noted; no prior EKG for comparison.  I have personally reviewed the EKG tracing and agree with the computer interpretation.      Assessment & Plan:  He had a glomerulonephritis with membranous nephropathy and nephrotic symptoms with causes of thyroiditis, carcinoma, NSAIDs, Hep B, captopril and auto-immune disease. Will restrict dietary sodium 2g a day, and start diuretic lasix. At high risk for reoccurrence of DVT due to hypercoagulability.   1. Essential hypertension   2. Chronic deep vein thrombosis (DVT) of femoral vein of right lower extremity (HCC)   3. Moderate malnutrition ([place])   4. Routine screening for STI (sexually transmitted infection)   5.  Screening for HIV (human immunodeficiency virus)   6. Pure hypercholesterolemia   7. Anemia, unspecified type   8. Morbid obesity (HCC)   9. 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 Generalized edema   12. Other hypervolemia   13. History of DVT of lower extremity     Orders Placed This Encounter  Procedures  . HIV antibody  . Comprehensive metabolic panel  . Acute Hep Panel & Hep B Surface Ab  . Protein / Creatinine Ratio, Urine  . Protein, urine, 24 hour    Standing Status:   Future    Standing Expiration Date:   07/03/2018  . Sedimentation Rate  . C-reactive protein  . TSH  . Protime-INR  . APTT  . Acute Hep Panel & Hep B Surface Ab  . Comprehensive metabolic panel  . HIV antibody  . POCT urinalysis dipstick  . POCT Microscopic Urinalysis ([facility])  . POCT CBC  . POCT glycosylated hemoglobin (Hb A1C)  . EKG 12-Lead    Meds ordered this encounter  Medications  . labetalol (NORMODYNE) 200 MG tablet    Sig: Take 1 tablet (200 mg total) by mouth 2 (two) times daily.    Dispense:  180 tablet    Refill:  0  . furosemide (LASIX) 40 MG tablet    Sig: Take 2 tabs po qam  and 1 tab po with lunch    Dispense:  270 tablet    Refill:  0  . potassium chloride SA (K-DUR,KLOR-CON) 20 MEQ tablet    Sig: Take 1 tablet (20 mEq total) by mouth daily.    Dispense:  90 tablet    Refill:  0    I personally performed the services described in this documentation, which was scribed in my presence. The recorded information has been reviewed and considered, and addended by me as needed.   Delman Cheadle, M.D.  Primary Care at [facility] [address] [place], [place] [address] [phone] phone [phone] fax  07/04/17 10:02 AM

## 2017-07-03 LAB — COMPREHENSIVE METABOLIC PANEL
A/G RATIO: 1.2 (ref 1.2–2.2)
ALK PHOS: 70 IU/L (ref 39–117)
ALT: 29 IU/L (ref 0–44)
AST: 24 IU/L (ref 0–40)
Albumin: 2.9 g/dL — ABNORMAL LOW (ref 3.5–5.5)
BILIRUBIN TOTAL: 0.3 mg/dL (ref 0.0–1.2)
BUN/Creatinine Ratio: 15 (ref 9–20)
BUN: 20 mg/dL (ref 6–24)
CHLORIDE: 106 mmol/L (ref 96–106)
CO2: 28 mmol/L (ref 20–29)
Calcium: 8.9 mg/dL (ref 8.7–10.2)
Creatinine, Ser: 1.37 mg/dL — ABNORMAL HIGH (ref 0.76–1.27)
GFR calc Af Amer: 71 mL/min/{1.73_m2} (ref >59)
GFR calc non Af Amer: 62 mL/min/{1.73_m2} (ref >59)
Globulin, Total: 2.5 g/dL (ref 1.5–4.5)
Glucose: 133 mg/dL — ABNORMAL HIGH (ref 65–99)
POTASSIUM: 4.5 mmol/L (ref 3.5–5.2)
SODIUM: 145 mmol/L — AB (ref 134–144)
Total Protein: 5.4 g/dL — ABNORMAL LOW (ref 6.0–8.5)

## 2017-07-03 LAB — ACUTE HEP PANEL AND HEP B SURFACE AB
HEP B C IGM: NEGATIVE
HEP B S AG: NEGATIVE
HEPATITIS B SURF AB QUANT: 6.4 m[IU]/mL — AB
Hep A IgM: NEGATIVE

## 2017-07-03 LAB — PROTIME-INR
INR: 0.9 (ref 0.8–1.2)
Prothrombin Time: 9.6 s (ref 9.1–12.0)

## 2017-07-03 LAB — C-REACTIVE PROTEIN: CRP: 6.6 mg/L — AB (ref 0.0–4.9)

## 2017-07-03 LAB — SEDIMENTATION RATE: Sed Rate: 63 mm/hr — ABNORMAL HIGH (ref 0–15)

## 2017-07-03 LAB — PROTEIN / CREATININE RATIO, URINE
Creatinine, Urine: 83.5 mg/dL
PROTEIN UR: 816.3 mg/dL
Protein/Creat Ratio: 9776 mg/g creat — ABNORMAL HIGH (ref 0–200)

## 2017-07-03 LAB — HIV ANTIBODY (ROUTINE TESTING W REFLEX): HIV Screen 4th Generation wRfx: NONREACTIVE

## 2017-07-03 LAB — APTT: aPTT: 25 s (ref 24–33)

## 2017-07-03 LAB — TSH: TSH: 3.28 u[IU]/mL (ref 0.450–4.500)

## 2017-07-04 ENCOUNTER — Encounter: Payer: Self-pay | Admitting: Family Medicine

## 2017-07-04 ENCOUNTER — Other Ambulatory Visit: Payer: Self-pay

## 2017-07-04 ENCOUNTER — Ambulatory Visit: Payer: Federal, State, Local not specified - PPO | Admitting: Family Medicine

## 2017-07-04 VITALS — BP 122/76 | HR 92 | Temp 98.5°F | Resp 16 | Ht 69.0 in | Wt 329.0 lb

## 2017-07-04 DIAGNOSIS — R7303 Prediabetes: Secondary | ICD-10-CM | POA: Diagnosis not present

## 2017-07-04 DIAGNOSIS — E8770 Fluid overload, unspecified: Secondary | ICD-10-CM | POA: Insufficient documentation

## 2017-07-04 DIAGNOSIS — R601 Generalized edema: Secondary | ICD-10-CM | POA: Insufficient documentation

## 2017-07-04 DIAGNOSIS — Z86718 Personal history of other venous thrombosis and embolism: Secondary | ICD-10-CM | POA: Insufficient documentation

## 2017-07-04 DIAGNOSIS — I10 Essential (primary) hypertension: Secondary | ICD-10-CM

## 2017-07-04 DIAGNOSIS — E7800 Pure hypercholesterolemia, unspecified: Secondary | ICD-10-CM | POA: Insufficient documentation

## 2017-07-04 DIAGNOSIS — Z5181 Encounter for therapeutic drug level monitoring: Secondary | ICD-10-CM | POA: Diagnosis not present

## 2017-07-04 DIAGNOSIS — N062 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, unspecified: Secondary | ICD-10-CM | POA: Insufficient documentation

## 2017-07-04 DIAGNOSIS — D649 Anemia, unspecified: Secondary | ICD-10-CM | POA: Insufficient documentation

## 2017-07-04 LAB — POCT URINALYSIS DIP (MANUAL ENTRY)
Bilirubin, UA: NEGATIVE
Glucose, UA: 100 mg/dL — AB
Ketones, POC UA: NEGATIVE mg/dL
Leukocytes, UA: NEGATIVE
NITRITE UA: NEGATIVE
Protein Ur, POC: >=300 mg/dL — AB
Spec Grav, UA: >=1.03 — AB (ref 1.010–1.025)
Urobilinogen, UA: 0.2 E.U./dL
pH, UA: 6 (ref 5.0–8.0)

## 2017-07-04 NOTE — Progress Notes (Signed)
Subjective:  By signing my name below, I, [name], attest that this documentation has been prepared under the direction and in the presence of Delman Cheadle, MD Electronically Signed: Ladene Artist, ED Scribe 07/04/2017 at 10:47 AM.   Patient ID: Hector Brooks, male    DOB: 1970-08-07, 47 y.o.   MRN: [id number]  Chief Complaint  Patient presents with  . Follow-up    hyperolemia, edema, ascites diuresis and kidneys    HPI Hector Brooks is a 47 y.o. male who presents to Primary Care at [facility] for f/u of hypervolemia and edema. Pt states he can feels a little better than he did 2 days ago and has been urinating a lot more. He still has some abdominal distention and leg swelling which has improved some. Pt is taking 2 tabs of Lasix in the morning and 1 tab at night. Denies sob or smothering sensation with laying. He has an appointment with nephrology on 5/6 at 2:30 PM.  Past Medical History:  Diagnosis Date  . Chronic kidney disease   . 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 atorvastatin (LIPITOR) 40 MG tablet Take 1 tablet (40 mg total) by mouth daily. 90 tablet 3  . furosemide (LASIX) 40 MG tablet Take 2 tabs po qam and 1 tab po with lunch 270 tablet 0  . IBUPROFEN PO Take 2-3 tablets by mouth 2 (two) times daily as needed (pain).    Marland Kitchen labetalol (NORMODYNE) 200 MG tablet Take 1 tablet (200 mg total) by mouth 2 (two) times daily. 180 tablet 0  . Multiple Vitamin (MULTIVITAMIN) tablet Take 1 tablet by mouth daily.    . potassium chloride SA (K-DUR,KLOR-CON) 20 MEQ tablet Take 1 tablet (20 mEq total) by mouth daily. 90 tablet 0   No current facility-administered medications on file prior to visit.    No Known Allergies   Review of Systems  Respiratory: Negative for shortness of breath.   Cardiovascular: Positive for  leg swelling (improving).  Gastrointestinal: Positive for abdominal distention (improving).      Objective:   Physical Exam  Constitutional: He is oriented to person, place, and time. He appears well-developed and well-nourished. No distress.  HENT:  Head: Normocephalic and atraumatic.  Eyes: Conjunctivae and EOM are normal.  Neck: Neck supple. No tracheal deviation present.  Cardiovascular: Normal rate and regular rhythm.  Pulmonary/Chest: Effort normal. No respiratory distress.  Musculoskeletal: Normal range of motion. He exhibits edema (2+ pitting edema in bilateral legs, distinctly improved from prior).  Neurological: He is alert and oriented to person, place, and time.  Skin: Skin is warm and dry.  Psychiatric: He has a normal mood and affect. His behavior is normal.  Nursing note and vitals reviewed.  BP 122/76   Pulse 92   Temp 98.5 F (36.9 C)   Resp 16   Ht 5\' 9"  (1.753 m)   Wt (!) 329 lb (149.2 kg)   SpO2 96%   BMI 48.58 kg/m     Assessment & Plan:   1. 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 Essential hypertension   3. Medication monitoring encounter   4. Prediabetes    Is starting to diurese. Has appt with [facility] sched in 2d for additional f/u and management of a recurrence of the nephritis. Check bmp since started lasix 80 qam and  40 qlunch w/ K. - Continue same diuretic dose for now.  Never received collections materials for 24hr urine protein ordered at visit 2d ago so will ensure he receives today and can hand in collection on Monday - reviewed proper collection technique for full 24 hr starting after first morning void and ending with first morning void - handout given.  Orders Placed This Encounter  Procedures  . Basic metabolic panel    Order Specific Question:   Has the patient fasted?    Answer:   No  . Creatinine, Urine, 24 Hour    Standing Status:   Future    Number of Occurrences:   1    Standing Expiration Date:    07/05/2018  . Protein, urine, 24 hour    Standing Status:   Future    Number of Occurrences:   1    Standing Expiration Date:   07/05/2018  . POCT urinalysis dipstick     I personally performed the services described in this documentation, which was scribed in my presence. The recorded information has been reviewed and considered, and addended by me as needed.   Delman Cheadle, M.D.  Primary Care at [facility] [address] [place], [place] [address] [phone] phone [phone] fax  07/06/17 7:00 PM

## 2017-07-04 NOTE — Patient Instructions (Addendum)
Drop your 24 hour urine off here on Monday after you start the collection Sunday morning.    IF you received an x-ray today, you will receive an invoice from [facility]. Please contact [facility] at [phone] with questions or concerns regarding your invoice.   IF you received labwork today, you will receive an invoice from [facility]. Please contact [facility] at [phone] with questions or concerns regarding your invoice.   Our billing staff will not be able to assist you with questions regarding bills from these companies.  You will be contacted with the lab results as soon as they are available. The fastest way to get your results is to activate your My Chart account. Instructions are located on the last page of this paperwork. If you have not heard from [place] regarding the results in 2 weeks, please contact this office.      Low-Sodium Eating Plan Sodium, which is an element that makes up salt, helps you maintain a healthy balance of fluids in your body. Too much sodium can increase your blood pressure and cause fluid and waste to be held in your body. Your health care provider or dietitian may recommend following this plan if you have high blood pressure (hypertension), kidney disease, liver disease, or heart failure. Eating less sodium can help lower your blood pressure, reduce swelling, and protect your heart, liver, and kidneys. What are tips for following this plan? General guidelines  Most people on this plan should limit their sodium intake to 1,500-2,000 mg (milligrams) of sodium each day. Reading food labels  The Nutrition Facts label lists the amount of sodium in one serving of the food. If you eat more than one serving, you must multiply the listed amount of sodium by the number of servings.  Choose foods with less than 140 mg of sodium per serving.  Avoid foods with 300 mg of sodium or more per serving. Shopping  Look for lower-sodium products, often  labeled as "low-sodium" or "no salt added."  Always check the sodium content even if foods are labeled as "unsalted" or "no salt added".  Buy fresh foods. ? Avoid canned foods and premade or frozen meals. ? Avoid canned, cured, or processed meats  Buy breads that have less than 80 mg of sodium per slice. Cooking  Eat more home-cooked food and less restaurant, buffet, and fast food.  Avoid adding salt when cooking. Use salt-free seasonings or herbs instead of table salt or sea salt. Check with your health care provider or pharmacist before using salt substitutes.  Cook with plant-based oils, such as canola, sunflower, or olive oil. Meal planning  When eating at a restaurant, ask that your food be prepared with less salt or no salt, if possible.  Avoid foods that contain MSG (monosodium glutamate). MSG is sometimes added to [place] food, bouillon, and some canned foods. What foods are recommended? The items listed may not be a complete list. Talk with your dietitian about what dietary choices are best for you. Grains Low-sodium cereals, including oats, puffed wheat and rice, and shredded wheat. Low-sodium crackers. Unsalted rice. Unsalted pasta. Low-sodium bread. Whole-grain breads and whole-grain pasta. Vegetables Fresh or frozen vegetables. "No salt added" canned vegetables. "No salt added" tomato sauce and paste. Low-sodium or reduced-sodium tomato and vegetable juice. Fruits Fresh, frozen, or canned fruit. Fruit juice. Meats and other protein foods Fresh or frozen (no salt added) meat, poultry, seafood, and fish. Low-sodium canned tuna and salmon. Unsalted nuts. Dried peas, beans, and lentils  without added salt. Unsalted canned beans. Eggs. Unsalted nut butters. Dairy Milk. Soy milk. Cheese that is naturally low in sodium, such as ricotta cheese, fresh mozzarella, or Swiss cheese Low-sodium or reduced-sodium cheese. Cream cheese. Yogurt. Fats and oils Unsalted butter. Unsalted  margarine with no trans fat. Vegetable oils such as canola or olive oils. Seasonings and other foods Fresh and dried herbs and spices. Salt-free seasonings. Low-sodium mustard and ketchup. Sodium-free salad dressing. Sodium-free light mayonnaise. Fresh or refrigerated horseradish. Lemon juice. Vinegar. Homemade, reduced-sodium, or low-sodium soups. Unsalted popcorn and pretzels. Low-salt or salt-free chips. What foods are not recommended? The items listed may not be a complete list. Talk with your dietitian about what dietary choices are best for you. Grains Instant hot cereals. Bread stuffing, pancake, and biscuit mixes. Croutons. Seasoned rice or pasta mixes. Noodle soup cups. Boxed or frozen macaroni and cheese. Regular salted crackers. Self-rising flour. Vegetables Sauerkraut, pickled vegetables, and relishes. Olives. [place] fries. Onion rings. Regular canned vegetables (not low-sodium or reduced-sodium). Regular canned tomato sauce and paste (not low-sodium or reduced-sodium). Regular tomato and vegetable juice (not low-sodium or reduced-sodium). Frozen vegetables in sauces. Meats and other protein foods Meat or fish that is salted, canned, smoked, spiced, or pickled. Bacon, ham, sausage, hotdogs, corned beef, chipped beef, packaged lunch meats, salt pork, jerky, pickled herring, anchovies, regular canned tuna, sardines, salted nuts. Dairy Processed cheese and cheese spreads. Cheese curds. Blue cheese. Feta cheese. String cheese. Regular cottage cheese. Buttermilk. Canned milk. Fats and oils Salted butter. Regular margarine. Ghee. Bacon fat. Seasonings and other foods Onion salt, garlic salt, seasoned salt, table salt, and sea salt. Canned and packaged gravies. Worcestershire sauce. Tartar sauce. Barbecue sauce. Teriyaki sauce. Soy sauce, including reduced-sodium. Steak sauce. Fish sauce. Oyster sauce. Cocktail sauce. Horseradish that you find on the shelf. Regular ketchup and mustard. Meat  flavorings and tenderizers. Bouillon cubes. Hot sauce and Tabasco sauce. Premade or packaged marinades. Premade or packaged taco seasonings. Relishes. Regular salad dressings. Salsa. Potato and tortilla chips. Corn chips and puffs. Salted popcorn and pretzels. Canned or dried soups. Pizza. Frozen entrees and pot pies. Summary  Eating less sodium can help lower your blood pressure, reduce swelling, and protect your heart, liver, and kidneys.  Most people on this plan should limit their sodium intake to 1,500-2,000 mg (milligrams) of sodium each day.  Canned, boxed, and frozen foods are high in sodium. Restaurant foods, fast foods, and pizza are also very high in sodium. You also get sodium by adding salt to food.  Try to cook at home, eat more fresh fruits and vegetables, and eat less fast food, canned, processed, or prepared foods. This information is not intended to replace advice given to you by your health care provider. Make sure you discuss any questions you have with your health care provider. Document Released: 08/09/2001 Document Revised: 02/11/2016 Document Reviewed: 02/11/2016 Elsevier Interactive Patient Education  [employer].

## 2017-07-05 LAB — BASIC METABOLIC PANEL
BUN/Creatinine Ratio: 13 (ref 9–20)
BUN: 21 mg/dL (ref 6–24)
CALCIUM: 8.2 mg/dL — AB (ref 8.7–10.2)
CHLORIDE: 107 mmol/L — AB (ref 96–106)
CO2: 25 mmol/L (ref 20–29)
Creatinine, Ser: 1.63 mg/dL — ABNORMAL HIGH (ref 0.76–1.27)
GFR calc non Af Amer: 50 mL/min/{1.73_m2} — ABNORMAL LOW (ref >59)
GFR, EST AFRICAN AMERICAN: 58 mL/min/{1.73_m2} — AB (ref >59)
Glucose: 153 mg/dL — ABNORMAL HIGH (ref 65–99)
POTASSIUM: 4.4 mmol/L (ref 3.5–5.2)
Sodium: 143 mmol/L (ref 134–144)

## 2017-07-06 DIAGNOSIS — N052 Unspecified nephritic syndrome with diffuse membranous glomerulonephritis: Secondary | ICD-10-CM | POA: Diagnosis not present

## 2017-07-06 DIAGNOSIS — N183 Chronic kidney disease, stage 3 (moderate): Secondary | ICD-10-CM | POA: Diagnosis not present

## 2017-07-06 DIAGNOSIS — N062 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: Secondary | ICD-10-CM | POA: Diagnosis not present

## 2017-07-06 DIAGNOSIS — I129 Hypertensive chronic kidney disease with stage 1 through stage 4 chronic kidney disease, or unspecified chronic kidney disease: Secondary | ICD-10-CM | POA: Diagnosis not present

## 2017-07-07 LAB — CREATININE, URINE, 24 HOUR
CREATININE 24H UR: 2461 mg/(24.h) — AB (ref 1000–2000)
Creatinine, Urine: 79.4 mg/dL

## 2017-07-07 LAB — PROTEIN, URINE, 24 HOUR
Protein, 24H Urine: 12871 mg/24 hr — ABNORMAL HIGH (ref 30–150)
Protein, Ur: 415.2 mg/dL

## 2017-07-07 NOTE — Progress Notes (Signed)
Faxed

## 2017-07-16 ENCOUNTER — Other Ambulatory Visit: Payer: Self-pay | Admitting: Nephrology

## 2017-07-16 DIAGNOSIS — N052 Unspecified nephritic syndrome with diffuse membranous glomerulonephritis: Secondary | ICD-10-CM

## 2017-07-16 DIAGNOSIS — I10 Essential (primary) hypertension: Secondary | ICD-10-CM

## 2017-07-16 DIAGNOSIS — N183 Chronic kidney disease, stage 3 unspecified: Secondary | ICD-10-CM

## 2017-07-22 ENCOUNTER — Ambulatory Visit: Payer: Federal, State, Local not specified - PPO | Admitting: Family Medicine

## 2017-07-23 ENCOUNTER — Other Ambulatory Visit (HOSPITAL_COMMUNITY): Payer: Self-pay | Admitting: Nephrology

## 2017-07-23 DIAGNOSIS — R809 Proteinuria, unspecified: Secondary | ICD-10-CM

## 2017-07-23 DIAGNOSIS — N052 Unspecified nephritic syndrome with diffuse membranous glomerulonephritis: Secondary | ICD-10-CM

## 2017-07-30 DIAGNOSIS — R809 Proteinuria, unspecified: Secondary | ICD-10-CM | POA: Diagnosis not present

## 2017-07-30 DIAGNOSIS — N183 Chronic kidney disease, stage 3 (moderate): Secondary | ICD-10-CM | POA: Diagnosis not present

## 2017-07-30 DIAGNOSIS — I129 Hypertensive chronic kidney disease with stage 1 through stage 4 chronic kidney disease, or unspecified chronic kidney disease: Secondary | ICD-10-CM | POA: Diagnosis not present

## 2017-07-30 DIAGNOSIS — N052 Unspecified nephritic syndrome with diffuse membranous glomerulonephritis: Secondary | ICD-10-CM | POA: Diagnosis not present

## 2017-08-04 ENCOUNTER — Ambulatory Visit
Admission: RE | Admit: 2017-08-04 | Discharge: 2017-08-04 | Disposition: A | Discharge status: Home / Self Care | Payer: Federal, State, Local not specified - PPO | Source: Ambulatory Visit | Attending: Nephrology | Admitting: Nephrology

## 2017-08-04 DIAGNOSIS — N189 Chronic kidney disease, unspecified: Secondary | ICD-10-CM | POA: Diagnosis not present

## 2017-08-04 DIAGNOSIS — N052 Unspecified nephritic syndrome with diffuse membranous glomerulonephritis: Secondary | ICD-10-CM

## 2017-08-04 DIAGNOSIS — N183 Chronic kidney disease, stage 3 unspecified: Secondary | ICD-10-CM

## 2017-08-04 DIAGNOSIS — I10 Essential (primary) hypertension: Secondary | ICD-10-CM

## 2017-08-11 ENCOUNTER — Other Ambulatory Visit: Payer: Self-pay | Admitting: Radiology

## 2017-08-12 ENCOUNTER — Ambulatory Visit (HOSPITAL_COMMUNITY): Admission: RE | Admit: 2017-08-12 | Payer: Federal, State, Local not specified - PPO | Source: Ambulatory Visit

## 2017-08-27 ENCOUNTER — Other Ambulatory Visit: Payer: Self-pay | Admitting: Radiology

## 2017-08-31 ENCOUNTER — Encounter (HOSPITAL_COMMUNITY): Payer: Self-pay

## 2017-08-31 ENCOUNTER — Ambulatory Visit (HOSPITAL_COMMUNITY)
Admission: RE | Admit: 2017-08-31 | Discharge: 2017-08-31 | Disposition: A | Discharge status: Home / Self Care | Payer: Federal, State, Local not specified - PPO | Source: Ambulatory Visit | Attending: Nephrology | Admitting: Nephrology

## 2017-08-31 DIAGNOSIS — Z79899 Other long term (current) drug therapy: Secondary | ICD-10-CM | POA: Diagnosis not present

## 2017-08-31 DIAGNOSIS — N183 Chronic kidney disease, stage 3 (moderate): Secondary | ICD-10-CM | POA: Insufficient documentation

## 2017-08-31 DIAGNOSIS — N052 Unspecified nephritic syndrome with diffuse membranous glomerulonephritis: Secondary | ICD-10-CM

## 2017-08-31 DIAGNOSIS — R809 Proteinuria, unspecified: Secondary | ICD-10-CM | POA: Diagnosis not present

## 2017-08-31 DIAGNOSIS — Z833 Family history of diabetes mellitus: Secondary | ICD-10-CM | POA: Insufficient documentation

## 2017-08-31 DIAGNOSIS — Z86718 Personal history of other venous thrombosis and embolism: Secondary | ICD-10-CM | POA: Insufficient documentation

## 2017-08-31 LAB — CBC
HCT: 32.8 % — ABNORMAL LOW (ref 39.0–52.0)
Hemoglobin: 10.6 g/dL — ABNORMAL LOW (ref 13.0–17.0)
MCH: 29.4 pg (ref 26.0–34.0)
MCHC: 32.3 g/dL (ref 30.0–36.0)
MCV: 90.9 fL (ref 78.0–100.0)
Platelets: 174 10*3/uL (ref 150–400)
RBC: 3.61 MIL/uL — ABNORMAL LOW (ref 4.22–5.81)
RDW: 12.5 % (ref 11.5–15.5)
WBC: 5.4 10*3/uL (ref 4.0–10.5)

## 2017-08-31 LAB — PROTIME-INR
INR: 0.97
PROTHROMBIN TIME: 12.8 s (ref 11.4–15.2)

## 2017-08-31 LAB — APTT: APTT: 27 s (ref 24–36)

## 2017-08-31 MED ORDER — FENTANYL CITRATE (PF) 100 MCG/2ML IJ SOLN
INTRAMUSCULAR | Status: AC | PRN
  Administered 2017-08-31: 50 ug via INTRAVENOUS

## 2017-08-31 MED ORDER — LIDOCAINE-EPINEPHRINE 1 %-1:100000 IJ SOLN
INTRAMUSCULAR | Status: AC
  Filled 2017-08-31: qty 1

## 2017-08-31 MED ORDER — GELATIN ABSORBABLE 12-7 MM EX MISC
CUTANEOUS | Status: AC
  Filled 2017-08-31: qty 1

## 2017-08-31 MED ORDER — FENTANYL CITRATE (PF) 100 MCG/2ML IJ SOLN
INTRAMUSCULAR | Status: AC
  Filled 2017-08-31: qty 2

## 2017-08-31 MED ORDER — SODIUM CHLORIDE 0.9 % IV SOLN: INTRAVENOUS | Status: DC

## 2017-08-31 MED ORDER — MIDAZOLAM HCL 2 MG/2ML IJ SOLN
INTRAMUSCULAR | Status: AC
  Filled 2017-08-31: qty 2

## 2017-08-31 MED ORDER — MIDAZOLAM HCL 2 MG/2ML IJ SOLN
INTRAMUSCULAR | Status: AC | PRN
  Administered 2017-08-31: 0.5 mg via INTRAVENOUS
  Administered 2017-08-31: 1 mg via INTRAVENOUS

## 2017-08-31 NOTE — Progress Notes (Signed)
I inadvertently charted on this patient with the same name as mine at 1153 on post procedure assessment

## 2017-08-31 NOTE — Procedures (Signed)
Pre Procedure Dx: Proteinuria Post Procedural Dx: Same  Technically successful US guided biopsy of inferior pole of the left kidney.  EBL: None  No immediate complications.   Hector Alexandrea Westergard, MD Pager #: [phone]   

## 2017-08-31 NOTE — H&P (Signed)
Chief Complaint: Patient was seen in consultation today for random renal biopsy at the request of Rosita Fire  Referring Physician(s): Rosita Fire  Supervising Physician: Sandi Mariscal  Patient Status: [facility] - Out-pt  History of Present Illness: Hector Brooks is a 47 y.o. 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  Was treated by Dr Lorrene Reid Did well x 6 yrs Developed new extr edema and abd swelling Noticed change in urine PCP referred back to Nephrology ++ proteinuria  Scheduled now for biopsy    Past Medical History:  Diagnosis Date  . Chronic kidney disease   . 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 MENISCUS REPAIR Right 12/14    Allergies: Patient has no known allergies.  Medications: Prior to Admission medications   Medication Sig Start Date End Date Taking? Authorizing Provider  atorvastatin (LIPITOR) 40 MG tablet Take 1 tablet (40 mg total) by mouth daily. 05/21/16   [name] D, PA-C  furosemide (LASIX) 40 MG tablet Take 2 tabs po qam and 1 tab po with lunch 07/02/17   Shawnee Knapp, MD  IBUPROFEN PO Take 2-3 tablets by mouth 2 (two) times daily as needed (pain).    [provider]  labetalol (NORMODYNE) 200 MG tablet Take 1 tablet (200 mg total) by mouth 2 (two) times daily. 07/02/17   Shawnee Knapp, MD  Multiple Vitamin (MULTIVITAMIN) tablet Take 1 tablet by mouth daily.    [provider]  potassium chloride SA (K-DUR,KLOR-CON) 20 MEQ tablet Take 1 tablet (20 mEq total) by mouth daily. 07/02/17   Shawnee Knapp, MD     Family History  Problem Relation Age of  Onset  . Cancer Father   . Diabetes Father   . Hyperlipidemia Sister   . Diabetes Maternal Grandfather   . Heart disease Maternal Grandfather     Social History   Socioeconomic History  . Marital status: Married    Spouse name: Not on file  . Number of children: Not on file  . Years of education: Not on file  . Highest education level: Not on file  Occupational History  . Not on file  Social Needs  . Financial resource strain: Not on file  . Food insecurity:    Worry: Not on file    Inability: Not on file  . Transportation needs:    Medical: Not on file    Non-medical: Not on file  Tobacco Use  . Smoking status: Never Smoker  . Smokeless tobacco: Never Used  Substance and Sexual Activity  . Alcohol use: No    Alcohol/week: 0.0 oz  . Drug use: No  . Sexual activity: Yes  Lifestyle  . Physical activity:    Days per week: Not on file    Minutes per session: Not on file  . Stress: Not on file  Relationships  . Social connections:    Talks on phone: Not on file    Gets together: Not on file    Attends religious service: Not on file    Active member of club or organization: Not on file    Attends meetings of clubs or organizations: Not on file  Relationship status: Not on file  Other Topics Concern  . Not on file  Social History Narrative  . Not on file    Review of Systems: A 12 point ROS discussed and pertinent positives are indicated in the HPI above.  All other systems are negative.  Review of Systems  Constitutional: Positive for fatigue. Negative for activity change and fever.  Respiratory: Negative for cough and shortness of breath.   Cardiovascular: Negative for chest pain.  Gastrointestinal: Negative for abdominal pain.  Psychiatric/Behavioral: Negative for behavioral problems and confusion.    Vital Signs: BP (!) 146/97   Pulse 76   Temp 98.5 F (36.9 C) (Oral)   Ht 5\' 9"  (1.753 m)   Wt (!) 310 lb (140.6 kg)   SpO2 99%   BMI 45.78 kg/m    Physical Exam  Constitutional: He is oriented to person, place, and time.  Cardiovascular: Normal rate, regular rhythm and normal heart sounds.  Pulmonary/Chest: Effort normal and breath sounds normal.  Abdominal: Soft. Bowel sounds are normal.  Musculoskeletal: Normal range of motion.  Neurological: He is alert and oriented to person, place, and time.  Skin: Skin is warm and dry.  Psychiatric: He has a normal mood and affect. His behavior is normal. Judgment and thought content normal.  Nursing note and vitals reviewed.   Imaging: US Renal  Result Date: 08/04/2017 CLINICAL DATA:  Chronic renal disease. EXAM: RENAL / URINARY TRACT ULTRASOUND COMPLETE COMPARISON:  None. FINDINGS: Right Kidney: Length: 13.1 cm. Echogenicity within normal limits. No mass or hydronephrosis visualized. Left Kidney: Length: 13.5 cm. Echogenicity within normal limits. No mass or hydronephrosis visualized. Bladder: Appears normal for degree of bladder distention. IMPRESSION: Normal renal ultrasound. Electronically Signed   By: Fidela Salisbury M.D.   On: 08/04/2017 20:49    Labs:  CBC: Recent Labs    07/02/17 1116  WBC 5.3  HGB 12.1*  HCT 35.9*    COAGS: Recent Labs    07/02/17 1509  INR 0.9  APTT 25    BMP: Recent Labs    12/12/16 1222 07/02/17 1512 07/04/17 1113  NA 145* 145* 143  K 4.6 4.5 4.4  CL 106 106 107*  CO2 24 28 25   GLUCOSE 122* 133* 153*  BUN 15 20 21   CALCIUM 9.0 8.9 8.2*  CREATININE 1.14 1.37* 1.63*  GFRNONAA 78 62 50*  GFRAA 90 71 58*    LIVER FUNCTION TESTS: Recent Labs    12/12/16 1222 07/02/17 1512  BILITOT 0.4 0.3  AST 21 24  ALT 27 29  ALKPHOS 71 70  PROT 6.0 5.4*  ALBUMIN 3.2* 2.9*    TUMOR MARKERS: No results for input(s): AFPTM, CEA, CA199, CHROMGRNA in the last 8760 hours.  Assessment and Plan:  Known previous membranous glomerulonephritis Bx 2012 tx per Dr Vilma Meckel with PCP New urine changes; new extr edema and abd edema Now  with proteinuria Scheduled for random renal biopsy Risks and benefits discussed with the patient including, but not limited to bleeding, infection, damage to adjacent structures or low yield requiring additional tests.  All of the patient's questions were answered, patient is agreeable to proceed. Consent signed and in chart.   Thank you for this interesting consult.  I greatly enjoyed meeting Hector Brooks and look forward to participating in their care.  A copy of this report was sent to the requesting provider on this date.  Electronically Signed: Lavonia Drafts, PA-C 08/31/2017, 7:09 AM   I spent a total  of  30 Minutes   in face to face in clinical consultation, greater than 50% of which was counseling/coordinating care for random renal bx

## 2017-08-31 NOTE — Discharge Instructions (Signed)
**Note Hector Brooks-Identified via Obfuscation** Percutaneous Kidney Biopsy, Care After This sheet gives you information about how to care for yourself after your procedure. Your health care provider may also give you more specific instructions. If you have problems or questions, contact your health care provider. What can I expect after the procedure? After the procedure, it is common to have:  Pain or soreness near the area where the needle went through your skin (biopsy site).  Bright pink or cloudy urine for 24 hours after the procedure.  Follow these instructions at home: Activity  Return to your normal activities as told by your health care provider. Ask your health care provider what activities are safe for you.  Do not drive for 24 hours if you were given a medicine to help you relax (sedative).  Do not lift anything that is heavier than 10 lb (4.5 kg) until your health care provider tells you that it is safe.  Avoid activities that take a lot of effort (are strenuous) until your health care provider approves. Most people will have to wait 2 weeks before returning to activities such as exercise or sexual intercourse. General instructions  Take over-the-counter and prescription medicines only as told by your health care provider.  You may eat and drink after your procedure. Follow instructions from your health care provider about eating or drinking restrictions.  Check your biopsy site every day for signs of infection. Check for: ? More redness, swelling, or pain. ? More fluid or blood. ? Warmth. ? Pus or a bad smell.  Keep all follow-up visits as told by your health care provider. This is important. Contact a health care provider if:  You have more redness, swelling, or pain around your biopsy site.  You have more fluid or blood coming from your biopsy site.  Your biopsy site feels warm to the touch.  You have pus or a bad smell coming from your biopsy site.  You have blood in your urine more than 24 hours after  your procedure. Get help right away if:  You have dark red or brown urine.  You have a fever.  You are unable to urinate.  You feel burning when you urinate.  You feel faint.  You have severe pain in your abdomen or side. This information is not intended to replace advice given to you by your health care provider. Make sure you discuss any questions you have with your health care provider. Document Released: 10/20/2012 Document Revised: 11/30/2015 Document Reviewed: 11/30/2015 Elsevier Interactive Patient Education  2018 [employer]. Percutaneous Kidney Biopsy A kidney biopsy is a procedure to remove small pieces of tissue from a kidney. In a percutaneous biopsy, the tissue is removed using a needle that is inserted through the skin. This procedure is done so that the tissue can be examined under a microscope and checked for disease or infection. Tell a health care provider about:  Any allergies you have.  All medicines you are taking, including vitamins, herbs, eye drops, creams, and over-the-counter medicines.  Any problems you or family members have had with anesthetic medicines.  Any blood disorders you have.  Any surgeries you have had.  Any medical conditions you have.  Whether you are pregnant or may be pregnant. What are the risks? Generally, this is a safe procedure. However, problems may occur, including:  Infection.  Bleeding.  Allergic reactions to medicines.  Damage to other structures or organs.  Swelling from a collection of clotted blood outside a blood vessel (hematoma).  Blood **Note Hector Brooks-Identified via Obfuscation** in the urine (hematuria).  What happens before the procedure?  Follow instructions from your health care provider about eating or drinking restrictions.  Ask your health care provider about: ? Changing or stopping your regular medicines. This is especially important if you are taking diabetes medicines or blood thinners. ? Taking medicines such as aspirin and ibuprofen.  These medicines can thin your blood. Do not take these medicines before your procedure if your health care provider instructs you not to.  You may be given antibiotic medicine to help prevent infection.  You will have blood and urine samples taken. This is to make sure that you do not have a condition where you should not have a biopsy.  Plan to have someone take you home from the hospital or clinic.  Ask your health care provider how your biopsy site will be marked or identified. What happens during the procedure?  To lower your risk of infection: ? Your health care team will wash or sanitize their hands. ? Your skin will be washed with soap.  An IV tube will be inserted into one of your veins.  You will be given one or more of the following: ? A medicine to help you relax (sedative). ? A medicine to numb the area (local anesthetic).  You will lie on your abdomen. A firm pillow will be placed under your body to help push the kidneys closer to the surface of the skin. If you have a transplanted kidney, you will lie on your back.  The health care provider will mark the area where the needle will enter your skin.  An imaging test--such as an ultrasound, X-ray, CT scan, or MRI--will be used to locate the kidney. These images will also help the health care provider to guide the biopsy needle into the kidney.  You will be asked to hold your breath and stay still while the health care provider inserts the needle and removes the kidney tissue. ? You will need to hold your breath and stay still for 30-45 seconds. ? During the biopsy, you may hear a popping sound from the needle. ? You may also feel some pressure from the area where the needle is being inserted.  The needle may be inserted and removed 3 or 4 times to make sure that enough tissue is taken for testing.  A bandage (dressing) may be placed over the spot where the needle entered your skin (biopsy site). The procedure may vary  among health care providers and hospitals. What happens after the procedure?  Your blood pressure, heart rate, breathing rate, and blood oxygen level will be monitored until the medicines you were given have worn off.  You will need to lie on your back for 6-8 hours.  You may have some pain or soreness near the biopsy site.  You may have pink or cloudy urine from small amounts of blood. This is normal.  You may have grogginess or fatigue if you were given a sedative.  Do not drive for 24 hours if you were given a sedative.  It is up to you to get the results of your procedure. Ask your health care provider, or the department performing the procedure, when your results will be ready. This information is not intended to replace advice given to you by your health care provider. Make sure you discuss any questions you have with your health care provider. Document Released: 12/29/2003 Document Revised: 11/30/2015 Document Reviewed: 11/30/2015 Elsevier Interactive Patient Education  2018 [employer] **Note Hector Brooks-identified via Obfuscation** Inc[employer] ° °

## 2017-09-08 ENCOUNTER — Encounter (HOSPITAL_COMMUNITY): Payer: Self-pay | Admitting: Nephrology

## 2017-09-09 DIAGNOSIS — I129 Hypertensive chronic kidney disease with stage 1 through stage 4 chronic kidney disease, or unspecified chronic kidney disease: Secondary | ICD-10-CM | POA: Diagnosis not present

## 2017-09-09 DIAGNOSIS — R809 Proteinuria, unspecified: Secondary | ICD-10-CM | POA: Diagnosis not present

## 2017-09-09 DIAGNOSIS — N052 Unspecified nephritic syndrome with diffuse membranous glomerulonephritis: Secondary | ICD-10-CM | POA: Diagnosis not present

## 2017-09-09 DIAGNOSIS — N183 Chronic kidney disease, stage 3 (moderate): Secondary | ICD-10-CM | POA: Diagnosis not present

## 2017-09-27 ENCOUNTER — Other Ambulatory Visit: Payer: Self-pay | Admitting: Family Medicine

## 2017-09-28 NOTE — Telephone Encounter (Signed)
Labetalol 200 MG tab, Furosemide 40 MG tab, Potassium CL 20 MEQ ER tab refill Last Refill:07/02/17, No refills for any of these medications.  Last OV: 07/04/17 PCP: Dr. Brigitte Pulse Pharmacy:[facility] [address] [facility]   Routed for further refill consideration, unclear of continued treatment. Noted [facility] appointment.

## 2017-12-02 DIAGNOSIS — E785 Hyperlipidemia, unspecified: Secondary | ICD-10-CM | POA: Diagnosis not present

## 2017-12-02 DIAGNOSIS — N052 Unspecified nephritic syndrome with diffuse membranous glomerulonephritis: Secondary | ICD-10-CM | POA: Diagnosis not present

## 2017-12-02 DIAGNOSIS — I129 Hypertensive chronic kidney disease with stage 1 through stage 4 chronic kidney disease, or unspecified chronic kidney disease: Secondary | ICD-10-CM | POA: Diagnosis not present

## 2017-12-02 DIAGNOSIS — N183 Chronic kidney disease, stage 3 (moderate): Secondary | ICD-10-CM | POA: Diagnosis not present

## 2018-01-06 ENCOUNTER — Other Ambulatory Visit: Payer: Self-pay | Admitting: Family Medicine

## 2018-01-06 NOTE — Telephone Encounter (Signed)
See request. Thanks. 

## 2018-01-06 NOTE — Telephone Encounter (Signed)
Message left for pt. To call and schedule an appointment.

## 2018-01-08 ENCOUNTER — Other Ambulatory Visit: Payer: Self-pay | Admitting: Family Medicine

## 2018-01-08 NOTE — Telephone Encounter (Signed)
Requested medication (s) are due for refill today: yes  Requested medication (s) are on the active medication list: yes  Last refill:  10/13/17  Future visit scheduled: no  Notes to clinic:  Pt's creatinine high and was referred to [facility]. Please review    Requested Prescriptions  Pending Prescriptions Disp Refills   potassium chloride SA (K-DUR,KLOR-CON) 20 MEQ tablet [Pharmacy Med Name: POTASSIUM CL 20MEQ ER TABLETS] 90 tablet 0    Sig: TAKE 1 TABLET(20 MEQ) BY MOUTH DAILY     Endocrinology:  Minerals - Potassium Supplementation Failed - 01/08/2018 10:50 AM      Failed - Cr in normal range and within 360 days    Creat  Date Value Ref Range Status  08/24/2013 1.16 0.50 - 1.35 mg/dL Final   Creatinine, Ser  Date Value Ref Range Status  07/04/2017 1.63 (H) 0.76 - 1.27 mg/dL Final         Passed - K in normal range and within 360 days    Potassium  Date Value Ref Range Status  07/04/2017 4.4 3.5 - 5.2 mmol/L Final         Passed - Valid encounter within last 12 months    Recent Outpatient Visits          6 months ago 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, [place] D, PA-C   1 year ago Essential hypertension   [facility] [place], [place] D, [place]

## 2018-01-13 DIAGNOSIS — I129 Hypertensive chronic kidney disease with stage 1 through stage 4 chronic kidney disease, or unspecified chronic kidney disease: Secondary | ICD-10-CM | POA: Diagnosis not present

## 2018-01-13 DIAGNOSIS — N052 Unspecified nephritic syndrome with diffuse membranous glomerulonephritis: Secondary | ICD-10-CM | POA: Diagnosis not present

## 2018-01-13 DIAGNOSIS — N183 Chronic kidney disease, stage 3 (moderate): Secondary | ICD-10-CM | POA: Diagnosis not present

## 2018-01-13 DIAGNOSIS — E785 Hyperlipidemia, unspecified: Secondary | ICD-10-CM | POA: Diagnosis not present

## 2018-01-13 LAB — LIPID PANEL: LDL Cholesterol: 102

## 2018-01-13 LAB — BASIC METABOLIC PANEL
BUN: 15 (ref 4–21)
Creatinine: 1.4 — AB (ref <=1.3)
Potassium: 4 (ref 3.4–5.3)
SODIUM: 142 (ref 137–147)

## 2018-01-13 LAB — CBC AND DIFFERENTIAL
HCT: 37 — AB (ref 41–53)
Hemoglobin: 12.4 — AB (ref 13.5–17.5)
WBC: 7.8

## 2018-01-13 LAB — FECAL OCCULT BLOOD, GUAIAC: FECAL OCCULT BLD: NEGATIVE

## 2018-02-01 ENCOUNTER — Emergency Department (HOSPITAL_COMMUNITY): Payer: Federal, State, Local not specified - PPO

## 2018-02-01 ENCOUNTER — Other Ambulatory Visit: Payer: Self-pay

## 2018-02-01 ENCOUNTER — Emergency Department (HOSPITAL_COMMUNITY)
Admission: EM | Admit: 2018-02-01 | Discharge: 2018-02-01 | Disposition: A | Discharge status: Home / Self Care | Payer: Federal, State, Local not specified - PPO | Attending: Emergency Medicine | Admitting: Emergency Medicine

## 2018-02-01 ENCOUNTER — Encounter (HOSPITAL_COMMUNITY): Payer: Self-pay | Admitting: Emergency Medicine

## 2018-02-01 DIAGNOSIS — R058 Other specified cough: Secondary | ICD-10-CM

## 2018-02-01 DIAGNOSIS — J181 Lobar pneumonia, unspecified organism: Secondary | ICD-10-CM | POA: Insufficient documentation

## 2018-02-01 DIAGNOSIS — J45909 Unspecified asthma, uncomplicated: Secondary | ICD-10-CM | POA: Diagnosis not present

## 2018-02-01 DIAGNOSIS — Z79899 Other long term (current) drug therapy: Secondary | ICD-10-CM | POA: Insufficient documentation

## 2018-02-01 DIAGNOSIS — I10 Essential (primary) hypertension: Secondary | ICD-10-CM | POA: Diagnosis not present

## 2018-02-01 DIAGNOSIS — J189 Pneumonia, unspecified organism: Secondary | ICD-10-CM

## 2018-02-01 DIAGNOSIS — R05 Cough: Secondary | ICD-10-CM

## 2018-02-01 MED ORDER — AMOXICILLIN 500 MG PO CAPS
1000.0000 mg | ORAL_CAPSULE | Freq: Three times a day (TID) | ORAL | 0 refills | Status: AC
Start: 2018-02-01 — End: 2018-02-06

## 2018-02-01 MED ORDER — ALBUTEROL SULFATE HFA 108 (90 BASE) MCG/ACT IN AERS: 2.0000 | INHALATION_SPRAY | RESPIRATORY_TRACT | 1 refills | Status: DC | PRN

## 2018-02-01 MED ORDER — ALBUTEROL SULFATE (2.5 MG/3ML) 0.083% IN NEBU
5.0000 mg | INHALATION_SOLUTION | Freq: Once | RESPIRATORY_TRACT | Status: AC
  Administered 2018-02-01: 5 mg via RESPIRATORY_TRACT
  Filled 2018-02-01: qty 6

## 2018-02-01 MED ORDER — AMOXICILLIN 500 MG PO CAPS
1000.0000 mg | ORAL_CAPSULE | Freq: Once | ORAL | Status: AC
  Administered 2018-02-01: 1000 mg via ORAL
  Filled 2018-02-01: qty 2

## 2018-02-01 MED ORDER — IPRATROPIUM BROMIDE 0.02 % IN SOLN
0.5000 mg | Freq: Once | RESPIRATORY_TRACT | Status: AC
  Administered 2018-02-01: 0.5 mg via RESPIRATORY_TRACT
  Filled 2018-02-01: qty 2.5

## 2018-02-01 MED ORDER — AZITHROMYCIN 250 MG PO TABS
500.0000 mg | ORAL_TABLET | Freq: Once | ORAL | Status: AC
  Administered 2018-02-01: 500 mg via ORAL
  Filled 2018-02-01: qty 2

## 2018-02-01 MED ORDER — AZITHROMYCIN 250 MG PO TABS: 250.0000 mg | ORAL_TABLET | Freq: Every day | ORAL | 0 refills | Status: AC

## 2018-02-01 NOTE — ED Triage Notes (Signed)
Pt reports generalized body aches, chills, fever, productive cough and runny nose. Reports his daughter had similar symptoms over Thanksgiving.

## 2018-02-01 NOTE — Discharge Instructions (Addendum)
It was our pleasure to provide your ER care today - we hope that you feel better.  Take antibiotics as prescribed. Use albuterol inhaler as need for wheezing. Take acetaminophen and/or ibuprofen as need for fever.   Your chest xray was read as follows: IMPRESSION: Ill-defined heterogeneous slightly nodular airspace opacities within the right mid and lower lung with associated nodularity of the bilateral pulmonary hila as could be seen in the setting of hilar adenopathy - constellation of findings are nonspecific though could be seen in the setting of atypical infection. Clinical correlation is advised. A follow-up chest radiograph in 3 to 4 weeks after treatment is recommended to ensure resolution.  Have your doctor repeat chest xray in 1 months time to make sure chest xray improves. Also follow up with your doctor for recheck of blood pressure, as it is high today.   Return to ER if worse, new symptoms, trouble breathing, other concern.

## 2018-02-01 NOTE — ED Provider Notes (Addendum)
[facility] EMERGENCY DEPARTMENT Provider Note   CSN: [id number] Arrival date & time: 02/01/18  0747     History   Chief Complaint Chief Complaint  Patient presents with  . URI    HPI Hector Brooks is a 47 y.o. male.  Patient c/o productive cough in the past 1-2 days. Pain episodic, persistent, moderate. Subjective fever. Also notes body aches. No sore throat or runny nose. No known ill contacts. Denies chest pain or discomfort. Non smoker. Hx childhood asthma but denies mdi use as adult.   The history is provided by the patient.  URI   Associated symptoms include cough. Pertinent negatives include no chest pain, no abdominal pain, no diarrhea, no vomiting, no dysuria, no headaches, no sore throat, no neck pain and no rash.    Past Medical History:  Diagnosis Date  . Chronic kidney disease   . 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 Generalized edema 07/04/2017  . Hypervolemia 07/04/2017  . History of DVT of lower extremity 07/04/2017  . Morbid obesity ([place]) 07/04/2017  . Anemia 07/04/2017  . Pure hypercholesterolemia 07/04/2017  . Prediabetes 07/04/2017  . HTN (hypertension) 05/21/2016  . 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 MENISCUS REPAIR Right 12/14        Home Medications    Prior to Admission medications   Medication Sig Start Date End Date Taking? Authorizing Provider  atorvastatin (LIPITOR) 40 MG tablet Take 1 tablet (40 mg total) by mouth daily. 05/21/16   [name] D, PA-C  furosemide (LASIX) 40 MG tablet TAKE 2 TABLETS BY MOUTH EVERY MORNING AND 1 TABLET WITH LUNCH 10/13/17   Stallings, Zoe A, MD  labetalol (NORMODYNE) 200 MG tablet TAKE 1 TABLET(200 MG) BY  MOUTH TWICE DAILY 10/13/17   Delia Chimes A, MD  lisinopril (PRINIVIL,ZESTRIL) 5 MG tablet Take 10 mg by mouth daily. 07/06/17   [provider]  Multiple Vitamin (MULTIVITAMIN) tablet Take 1 tablet by mouth daily.    [provider]  potassium chloride SA (K-DUR,KLOR-CON) 20 MEQ tablet TAKE 1 TABLET(20 MEQ) BY MOUTH DAILY 01/08/18   Forrest Moron, MD    Family History Family History  Problem Relation Age of Onset  . Cancer Father   . Diabetes Father   . Hyperlipidemia Sister   . Diabetes Maternal Grandfather   . Heart disease Maternal Grandfather     Social History Social History   Tobacco Use  . Smoking status: Never Smoker  . Smokeless tobacco: Never Used  Substance Use Topics  . Alcohol use: No    Alcohol/week: 0.0 standard drinks  . Drug use: No     Allergies   Patient has no known allergies.   Review of Systems Review of Systems  Constitutional: Positive for fever.  HENT: Negative for sore throat.   Eyes: Negative for redness.  Respiratory: Positive for cough. Negative for shortness of breath.   Cardiovascular: Negative for chest pain and leg swelling.  Gastrointestinal: Negative for abdominal pain, diarrhea and vomiting.  Genitourinary: Negative for dysuria and flank pain.  Musculoskeletal: Negative for neck pain and neck stiffness.  Skin: Negative for rash.  Neurological: Negative for headaches.  Hematological: Does not bruise/bleed easily.  Psychiatric/Behavioral: Negative for confusion.  Physical Exam Updated Vital Signs BP (!) 160/101 (BP Location: Right Arm)   Pulse (!) 108   Temp 98.7 F (37.1 C) (Oral)   Resp (!) 26   SpO2 96%   Physical Exam  Constitutional: He appears well-developed and well-nourished.  HENT:  Right Ear: External ear normal.  Left Ear: External ear normal.  Mouth/Throat: Oropharynx is clear and moist.  Eyes: Pupils are equal, round, and reactive to light. Conjunctivae are normal.  Neck: Normal  range of motion. Neck supple. No tracheal deviation present.  No stiffness or rigidity  Cardiovascular: Normal rate, regular rhythm, normal heart sounds and intact distal pulses. Exam reveals no gallop and no friction rub.  No murmur heard. Pulmonary/Chest: Effort normal. No accessory muscle usage. No respiratory distress. He has wheezes.  Rhonchi right.  Abdominal: Soft. Bowel sounds are normal. He exhibits no distension. There is no tenderness.  Musculoskeletal: He exhibits no edema or tenderness.  Neurological: He is alert.  Speech clear/fluent. Steady gait.   Skin: Skin is warm and dry. No rash noted.  Psychiatric: He has a normal mood and affect.  Nursing note and vitals reviewed.    ED Treatments / Results  Labs (all labs ordered are listed, but only abnormal results are displayed) Labs Reviewed - No data to display  EKG None  Radiology Dg Chest 2 View  Result Date: 02/01/2018 CLINICAL DATA:  Cough congestion fever since this past Saturday EXAM: CHEST - 2 VIEW COMPARISON:  07/09/2010 FINDINGS: Borderline enlarged cardiac silhouette. There is mild nodularity of the bilateral hila. Interval development of ill-defined heterogeneous slightly nodular opacities within the right mid and lower lung. No definite pleural effusion or pneumothorax. No definite evidence of edema. No acute osseus abnormalities. IMPRESSION: Ill-defined heterogeneous slightly nodular airspace opacities within the right mid and lower lung with associated nodularity of the bilateral pulmonary hila as could be seen in the setting of hilar adenopathy - constellation of findings are nonspecific though could be seen in the setting of atypical infection. Clinical correlation is advised. A follow-up chest radiograph in 3 to 4 weeks after treatment is recommended to ensure resolution. Electronically Signed   By: Sandi Mariscal M.D.   On: 02/01/2018 08:48    Procedures Procedures (including critical care time)  Medications  Ordered in ED Medications - No data to display   Initial Impression / Assessment and Plan / ED Course  I have reviewed the triage vital signs and the nursing notes.  Pertinent labs & imaging results that were available during my care of the patient were reviewed by me and considered in my medical decision making (see chart for details).  CXR.  Reviewed nursing notes and prior charts for additional history.   cxr reviewed - possible pna, right.  Albuterol and atrovent neb.  Po fluids.  Recheck, pt breathing comfortably, good air exchange. Not hypoxic.   Amoxicillin 1 gm po, zithromax po.   Discussed need pcp f/u, repeat/f/u cxr as outpt, f/u bp as outpt.   Pt currently appears stable for d/c.  Pt indicates he has adequate supply of his meds/bp meds at home, and that he will take upon returning home.   Return precautions provided.    Final Clinical Impressions(s) / ED Diagnoses   Final diagnoses:  None    ED Discharge Orders    None           Lajean Saver, MD 02/01/18 [phone]

## 2018-02-01 NOTE — ED Notes (Signed)
Patient transported to X-ray 

## 2018-03-08 DIAGNOSIS — I129 Hypertensive chronic kidney disease with stage 1 through stage 4 chronic kidney disease, or unspecified chronic kidney disease: Secondary | ICD-10-CM | POA: Diagnosis not present

## 2018-03-08 DIAGNOSIS — N183 Chronic kidney disease, stage 3 (moderate): Secondary | ICD-10-CM | POA: Diagnosis not present

## 2018-03-08 DIAGNOSIS — N052 Unspecified nephritic syndrome with diffuse membranous glomerulonephritis: Secondary | ICD-10-CM | POA: Diagnosis not present

## 2018-03-08 DIAGNOSIS — E785 Hyperlipidemia, unspecified: Secondary | ICD-10-CM | POA: Diagnosis not present

## 2018-04-01 ENCOUNTER — Other Ambulatory Visit: Payer: Self-pay | Admitting: Family Medicine

## 2018-04-02 NOTE — Telephone Encounter (Signed)
Requested medication (s) are due for refill today: Yes  Requested medication (s) are on the active medication list: Yes  Last refill:  01/08/18  Future visit scheduled: No  Notes to clinic:  See request    Requested Prescriptions  Pending Prescriptions Disp Refills   potassium chloride SA (K-DUR,KLOR-CON) 20 MEQ tablet [Pharmacy Med Name: POTASSIUM CL 20MEQ ER TABLETS] 90 tablet 0    Sig: TAKE 1 TABLET(20 MEQ) BY MOUTH DAILY     Endocrinology:  Minerals - Potassium Supplementation Failed - 04/01/2018 10:51 PM      Failed - Cr in normal range and within 360 days    Creat  Date Value Ref Range Status  08/24/2013 1.16 0.50 - 1.35 mg/dL Final   Creatinine, Ser  Date Value Ref Range Status  07/04/2017 1.63 (H) 0.76 - 1.27 mg/dL Final         Passed - K in normal range and within 360 days    Potassium  Date Value Ref Range Status  07/04/2017 4.4 3.5 - 5.2 mmol/L Final         Passed - Valid encounter within last 12 months    Recent Outpatient Visits          9 months ago 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

## 2018-05-01 ENCOUNTER — Other Ambulatory Visit: Payer: Self-pay | Admitting: Family Medicine

## 2018-05-03 NOTE — Telephone Encounter (Signed)
Requested medication (s) are due for refill today: yes  Requested medication (s) are on the active medication list: yes  Last refill:  04/01/2018  Future visit scheduled: no  Notes to clinic:  Dr Brigitte Pulse    Requested Prescriptions  Pending Prescriptions Disp Refills   potassium chloride SA (K-DUR,KLOR-CON) 20 MEQ tablet [Pharmacy Med Name: POTASSIUM CL 20MEQ ER TABLETS] 30 tablet 0    Sig: TAKE 1 TABLET(20 MEQ) BY MOUTH DAILY     Endocrinology:  Minerals - Potassium Supplementation Failed - 05/01/2018  6:08 AM      Failed - Cr in normal range and within 360 days    Creat  Date Value Ref Range Status  08/24/2013 1.16 0.50 - 1.35 mg/dL Final   Creatinine, Ser  Date Value Ref Range Status  07/04/2017 1.63 (H) 0.76 - 1.27 mg/dL Final         Passed - K in normal range and within 360 days    Potassium  Date Value Ref Range Status  07/04/2017 4.4 3.5 - 5.2 mmol/L Final         Passed - Valid encounter within last 12 months    Recent Outpatient Visits          10 months ago 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

## 2018-05-13 ENCOUNTER — Encounter: Payer: Self-pay | Admitting: *Deleted

## 2018-05-13 NOTE — Progress Notes (Signed)
Increased lasix 80 bid increased labetalol to 300 mg bid  Urine pcr 11364 albumin 2.9 o 12-02-2017 Urine pcr 6554 albumin 2.9 on 09-09-2017

## 2018-12-29 ENCOUNTER — Encounter: Payer: Federal, State, Local not specified - PPO | Admitting: Registered Nurse

## 2019-01-04 ENCOUNTER — Encounter: Payer: Self-pay | Admitting: Registered Nurse

## 2019-01-04 ENCOUNTER — Ambulatory Visit: Payer: Federal, State, Local not specified - PPO | Admitting: Registered Nurse

## 2019-01-04 ENCOUNTER — Other Ambulatory Visit: Payer: Self-pay

## 2019-01-04 VITALS — BP 170/110 | HR 99 | Temp 98.5°F | Resp 16 | Ht 69.0 in | Wt 348.8 lb

## 2019-01-04 DIAGNOSIS — Z23 Encounter for immunization: Secondary | ICD-10-CM | POA: Diagnosis not present

## 2019-01-04 DIAGNOSIS — Z1329 Encounter for screening for other suspected endocrine disorder: Secondary | ICD-10-CM | POA: Diagnosis not present

## 2019-01-04 DIAGNOSIS — G44229 Chronic tension-type headache, not intractable: Secondary | ICD-10-CM

## 2019-01-04 DIAGNOSIS — Z130 Encounter for screening for diseases of the blood and blood-forming organs and certain disorders involving the immune mechanism: Secondary | ICD-10-CM

## 2019-01-04 DIAGNOSIS — E785 Hyperlipidemia, unspecified: Secondary | ICD-10-CM

## 2019-01-04 DIAGNOSIS — Z13228 Encounter for screening for other metabolic disorders: Secondary | ICD-10-CM | POA: Diagnosis not present

## 2019-01-04 DIAGNOSIS — I10 Essential (primary) hypertension: Secondary | ICD-10-CM

## 2019-01-04 DIAGNOSIS — J302 Other seasonal allergic rhinitis: Secondary | ICD-10-CM

## 2019-01-04 DIAGNOSIS — Z13220 Encounter for screening for lipoid disorders: Secondary | ICD-10-CM

## 2019-01-04 MED ORDER — LABETALOL HCL 200 MG PO TABS: ORAL_TABLET | ORAL | 0 refills | Status: DC

## 2019-01-04 MED ORDER — ALBUTEROL SULFATE HFA 108 (90 BASE) MCG/ACT IN AERS: 2.0000 | INHALATION_SPRAY | RESPIRATORY_TRACT | 0 refills | Status: DC | PRN

## 2019-01-04 MED ORDER — ATORVASTATIN CALCIUM 40 MG PO TABS: 40.0000 mg | ORAL_TABLET | Freq: Every day | ORAL | 3 refills | Status: DC

## 2019-01-04 NOTE — Progress Notes (Signed)
Established Patient Office Visit  Subjective:  Patient ID: Hector Brooks, male    DOB: 08-Feb-1971  Age: 48 y.o. MRN: [id number]  CC:  Chief Complaint  Patient presents with  . Establish Care    TOC need new pcp to manage care and medications     HPI Hector Brooks presents for visit to establish care.   Formerly a patient of Dr. Brigitte Pulse. Last seen in this office in May 2019. Reports that he has run out of all of his medication except for the labetalol. He does not check his BP at home, but notes that he has chronic headaches that he is sure is partially to blame on his BP. Additionally, his lower extremities have been swollen since stopping his Lasix. BP elevated on presentation to our office today. Notes a hx of CKD - has not seen nephrologist in 1+ year.  Hx of DVT - despite swelling, does not feel this is DVT related.  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 Feels sore, fatigued, and out of it very often. Feels that he needs to get his health back on track.  Past Medical History:  Diagnosis Date  . Chronic kidney disease   . 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 MENISCUS REPAIR Right 12/14    Family History  Problem Relation Age of Onset  . Cancer Father   . Diabetes Father   . Hyperlipidemia Sister   . Diabetes Maternal Grandfather   . Heart disease Maternal Grandfather     Social History   Socioeconomic History  . Marital status: Married    Spouse name: Not on file  . Number of children: Not on file  . Years of education: Not on file  . Highest education level: Not on file  Occupational History  . Not on file  Social Needs  . Financial resource strain: Not on file  . Food insecurity    Worry: Not on file    Inability: Not on file  . Transportation  needs    Medical: Not on file    Non-medical: Not on file  Tobacco Use  . Smoking status: Never Smoker  . Smokeless tobacco: Never Used  Substance and Sexual Activity  . Alcohol use: No    Alcohol/week: 0.0 standard drinks  . Drug use: No  . Sexual activity: Yes  Lifestyle  . Physical activity    Days per week: Not on file    Minutes per session: Not on file  . Stress: Not on file  Relationships  . Social Herbalist on phone: Not on file    Gets together: Not on file    Attends religious service: Not on file    Active member of club or organization: Not on file    Attends meetings of clubs or organizations: Not on file    Relationship status: Not on file  . Intimate partner violence    Fear of current or ex partner: Not on file    Emotionally abused: Not on file    Physically abused: Not on file    Forced sexual activity: Not on file  Other Topics Concern  . Not on file  Social History Narrative  . Not on file    Outpatient Medications Prior to Visit  Medication Sig  Dispense Refill  . albuterol (PROVENTIL HFA;VENTOLIN HFA) 108 (90 Base) MCG/ACT inhaler Inhale 2 puffs into the lungs every 4 (four) hours as needed for wheezing or shortness of breath. 1 Inhaler 1  . atorvastatin (LIPITOR) 40 MG tablet Take 1 tablet (40 mg total) by mouth daily. 90 tablet 3  . furosemide (LASIX) 40 MG tablet TAKE 2 TABLETS BY MOUTH EVERY MORNING AND 1 TABLET WITH LUNCH 270 tablet 0  . labetalol (NORMODYNE) 200 MG tablet TAKE 1 TABLET(200 MG) BY MOUTH TWICE DAILY 180 tablet 0  . lisinopril (ZESTRIL) 40 MG tablet Take 40 mg by mouth daily.    . Multiple Vitamin (MULTIVITAMIN) tablet Take 1 tablet by mouth daily.    . potassium chloride SA (K-DUR,KLOR-CON) 20 MEQ tablet TAKE 1 TABLET(20 MEQ) BY MOUTH DAILY 30 tablet 0  . lisinopril (PRINIVIL,ZESTRIL) 5 MG tablet Take 10 mg by mouth daily.  5   No facility-administered medications prior to visit.     No Known Allergies  ROS  Review of Systems  Constitutional: Positive for fatigue.  HENT: Negative.   Eyes: Negative for visual disturbance.  Respiratory: Positive for shortness of breath and wheezing (allergies). Negative for cough and chest tightness.   Cardiovascular: Positive for leg swelling. Negative for chest pain and palpitations.  Gastrointestinal: Negative.   Endocrine: Negative.   Genitourinary: Negative.   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  Skin: Negative.   Allergic/Immunologic: Positive for environmental allergies.  Neurological: Positive for headaches. Negative for dizziness, syncope, weakness and light-headedness.  Hematological: Negative.   Psychiatric/Behavioral: Negative.   All other systems reviewed and are negative.     Objective:    Physical Exam  Constitutional: He is oriented to person, place, and time. He appears well-developed and well-nourished. No distress.  Cardiovascular: Normal rate and regular rhythm.  Pulmonary/Chest: Effort normal and breath sounds normal. No respiratory distress.  Musculoskeletal:        General: Edema (BLE) present.  Neurological: He is alert and oriented to person, place, and time.  Skin: Skin is warm and dry. No rash noted. He is not diaphoretic. No erythema. No pallor.  Psychiatric: He has a normal mood and affect. His behavior is normal. Judgment and thought content normal.  Nursing note and vitals reviewed.   BP (!) 170/110   Pulse 99   Temp 98.5 F (36.9 C) (Oral)   Resp 16   Ht 5\' 9"  (1.753 m)   Wt (!) 348 lb 12.8 oz (158.2 kg)   SpO2 93%   BMI 51.51 kg/m  Wt Readings from Last 3 Encounters:  01/04/19 (!) 348 lb 12.8 oz (158.2 kg)  02/01/18 (!) 320 lb (145.2 kg)  08/31/17 172 lb (78 kg)     There are no preventive care reminders to display for this patient.  There are no preventive care reminders to display for this patient.  Lab Results  Component Value Date   TSH 3.280 07/02/2017   Lab  Results  Component Value Date   WBC 7.8 01/13/2018   HGB 12.4 (A) 01/13/2018   HCT 37 (A) 01/13/2018   MCV 90.9 08/31/2017   PLT 174 08/31/2017   Lab Results  Component Value Date   NA 142 01/13/2018   K 4.0 01/13/2018   CO2 25 07/04/2017   GLUCOSE 153 (H) 07/04/2017   BUN 15 01/13/2018   CREATININE 1.4 (A) 01/13/2018   BILITOT 0.3 07/02/2017   ALKPHOS 70 07/02/2017   AST 24 07/02/2017  ALT 29 07/02/2017   PROT 5.4 (L) 07/02/2017   ALBUMIN 2.9 (L) 07/02/2017   CALCIUM 8.2 (L) 07/04/2017   Lab Results  Component Value Date   CHOL 210 (H) 12/12/2016   Lab Results  Component Value Date   HDL 49 12/12/2016   Lab Results  Component Value Date   LDLCALC 102 01/13/2018   Lab Results  Component Value Date   TRIG 125 12/12/2016   Lab Results  Component Value Date   CHOLHDL 4.3 12/12/2016   Lab Results  Component Value Date   HGBA1C 5.9 07/02/2017      Assessment & Plan:   Problem List Items Addressed This Visit    None    Visit Diagnoses    Screening for endocrine, metabolic and immunity disorder    -  Primary   Relevant Orders   CBC with Differential/Platelet   Comprehensive metabolic panel   Hemoglobin A1c   TSH   Flu vaccine need       Relevant Orders   Flu Vaccine QUAD 36+ mos IM (Completed)   Chronic tension-type headache, not intractable       Relevant Orders   EKG 12-Lead (Completed)   Lipid screening       Relevant Orders   Lipid panel      No orders of the defined types were placed in this encounter.   Follow-up: Return in about 2 weeks (around 01/18/2019) for BP check, med check with provider.   PLAN  EKG today: question for some ischemic changes, but no acute findings. Enough concern to warrant referral to cardiology, patient would likely benefit from echo and stress test. EKG reviewed by myself and Dr. Agustina Caroli  Will start on Lasix 20mg  PO qd and recheck bp in two weeks. Will slowly titrate up HTN medication. Should  cardiology desire to manage this medication - or nephrology - pt is aware that they may take control.  Labs ordered - will follow up with results as warranted  Reviewed ED precautions  Discussed the nonpharmacological side of treatment - improve diet, lose weight, limit sugary foods, fatty foods.   Work note given for today.  The above was discussed with the patient who demonstrated understanding.  Patient encouraged to call clinic with any questions, comments, or concerns.    Maximiano Coss, NP

## 2019-01-04 NOTE — Patient Instructions (Signed)
° ° ° °  If you have lab work done today you will be contacted with your lab results within the next 2 weeks.  If you have not heard from us then please contact us. The fastest way to get your results is to register for My Chart. ° ° °IF you received an x-ray today, you will receive an invoice from [facility]. Please contact [facility] at [phone] with questions or concerns regarding your invoice.  ° °IF you received labwork today, you will receive an invoice from [facility]. Please contact [facility] at [phone] with questions or concerns regarding your invoice.  ° °Our billing staff will not be able to assist you with questions regarding bills from these companies. ° °You will be contacted with the lab results as soon as they are available. The fastest way to get your results is to activate your My Chart account. Instructions are located on the last page of this paperwork. If you have not heard from us regarding the results in 2 weeks, please contact this office. °  ° ° ° °

## 2019-01-05 LAB — CBC WITH DIFFERENTIAL/PLATELET
Basophils Absolute: 0 10*3/uL (ref 0.0–0.2)
Basos: 0 %
EOS (ABSOLUTE): 0.2 10*3/uL (ref 0.0–0.4)
Eos: 3 %
Hematocrit: 31.7 % — ABNORMAL LOW (ref 37.5–51.0)
Hemoglobin: 10.2 g/dL — ABNORMAL LOW (ref 13.0–17.7)
Immature Grans (Abs): 0 10*3/uL (ref 0.0–0.1)
Immature Granulocytes: 0 %
Lymphocytes Absolute: 1.2 10*3/uL (ref 0.7–3.1)
Lymphs: 21 %
MCH: 29.1 pg (ref 26.6–33.0)
MCHC: 32.2 g/dL (ref 31.5–35.7)
MCV: 90 fL (ref 79–97)
Monocytes Absolute: 0.3 10*3/uL (ref 0.1–0.9)
Monocytes: 6 %
Neutrophils Absolute: 3.9 10*3/uL (ref 1.4–7.0)
Neutrophils: 70 %
Platelets: 208 10*3/uL (ref 150–450)
RBC: 3.51 x10E6/uL — ABNORMAL LOW (ref 4.14–5.80)
RDW: 13 % (ref 11.6–15.4)
WBC: 5.6 10*3/uL (ref 3.4–10.8)

## 2019-01-05 LAB — LIPID PANEL
Chol/HDL Ratio: 4.8 ratio (ref 0.0–5.0)
Cholesterol, Total: 264 mg/dL — ABNORMAL HIGH (ref 100–199)
HDL: 55 mg/dL (ref >39)
LDL Chol Calc (NIH): 191 mg/dL — ABNORMAL HIGH (ref 0–99)
Triglycerides: 103 mg/dL (ref 0–149)
VLDL Cholesterol Cal: 18 mg/dL (ref 5–40)

## 2019-01-05 LAB — COMPREHENSIVE METABOLIC PANEL
ALT: 26 IU/L (ref 0–44)
AST: 18 IU/L (ref 0–40)
Albumin/Globulin Ratio: 1.3 (ref 1.2–2.2)
Albumin: 2.8 g/dL — ABNORMAL LOW (ref 4.0–5.0)
Alkaline Phosphatase: 62 IU/L (ref 39–117)
BUN/Creatinine Ratio: 7 — ABNORMAL LOW (ref 9–20)
BUN: 19 mg/dL (ref 6–24)
Bilirubin Total: 0.2 mg/dL (ref 0.0–1.2)
CO2: 20 mmol/L (ref 20–29)
Calcium: 8.1 mg/dL — ABNORMAL LOW (ref 8.7–10.2)
Chloride: 111 mmol/L — ABNORMAL HIGH (ref 96–106)
Creatinine, Ser: 2.66 mg/dL — ABNORMAL HIGH (ref 0.76–1.27)
GFR calc Af Amer: 32 mL/min/{1.73_m2} — ABNORMAL LOW (ref >59)
GFR calc non Af Amer: 28 mL/min/{1.73_m2} — ABNORMAL LOW (ref >59)
Globulin, Total: 2.1 g/dL (ref 1.5–4.5)
Glucose: 123 mg/dL — ABNORMAL HIGH (ref 65–99)
Potassium: 4.9 mmol/L (ref 3.5–5.2)
Sodium: 143 mmol/L (ref 134–144)
Total Protein: 4.9 g/dL — ABNORMAL LOW (ref 6.0–8.5)

## 2019-01-05 LAB — TSH: TSH: 3.7 u[IU]/mL (ref 0.450–4.500)

## 2019-01-05 LAB — HEMOGLOBIN A1C
Est. average glucose Bld gHb Est-mCnc: 123 mg/dL
Hgb A1c MFr Bld: 5.9 % — ABNORMAL HIGH (ref 4.8–5.6)

## 2019-01-18 ENCOUNTER — Ambulatory Visit: Payer: Federal, State, Local not specified - PPO | Admitting: Registered Nurse

## 2019-01-18 ENCOUNTER — Other Ambulatory Visit: Payer: Self-pay

## 2019-01-18 ENCOUNTER — Other Ambulatory Visit: Payer: Self-pay | Admitting: Registered Nurse

## 2019-01-18 ENCOUNTER — Encounter: Payer: Self-pay | Admitting: Registered Nurse

## 2019-01-18 VITALS — BP 160/90 | HR 115 | Temp 98.5°F | Resp 18 | Wt 340.0 lb

## 2019-01-18 DIAGNOSIS — N289 Disorder of kidney and ureter, unspecified: Secondary | ICD-10-CM

## 2019-01-18 DIAGNOSIS — I10 Essential (primary) hypertension: Secondary | ICD-10-CM | POA: Diagnosis not present

## 2019-01-18 MED ORDER — LISINOPRIL 10 MG PO TABS: 10.0000 mg | ORAL_TABLET | Freq: Every day | ORAL | 0 refills | Status: DC

## 2019-01-18 MED ORDER — FUROSEMIDE 20 MG PO TABS: 20.0000 mg | ORAL_TABLET | Freq: Every day | ORAL | 0 refills | Status: DC

## 2019-01-18 NOTE — Patient Instructions (Signed)
° ° ° °  If you have lab work done today you will be contacted with your lab results within the next 2 weeks.  If you have not heard from us then please contact us. The fastest way to get your results is to register for My Chart. ° ° °IF you received an x-ray today, you will receive an invoice from [facility]. Please contact [facility] at [phone] with questions or concerns regarding your invoice.  ° °IF you received labwork today, you will receive an invoice from [facility]. Please contact [facility] at [phone] with questions or concerns regarding your invoice.  ° °Our billing staff will not be able to assist you with questions regarding bills from these companies. ° °You will be contacted with the lab results as soon as they are available. The fastest way to get your results is to activate your My Chart account. Instructions are located on the last page of this paperwork. If you have not heard from us regarding the results in 2 weeks, please contact this office. °  ° ° ° °

## 2019-01-18 NOTE — Telephone Encounter (Signed)
Requested medication (s) are due for refill today: yes  Requested medication (s) are on the active medication list: yes  Last refill:  01/18/2019  Future visit scheduled: yes  Notes to clinic: patient requesting 90 day supply   Requested Prescriptions  Pending Prescriptions Disp Refills   furosemide (LASIX) 20 MG tablet [Pharmacy Med Name: FUROSEMIDE 20MG  TABLETS] 90 tablet     Sig: TAKE 1 TABLET(20 MG) BY MOUTH DAILY     Cardiovascular:  Diuretics - Loop Failed - 01/18/2019  9:58 AM      Failed - Ca in normal range and within 360 days    Calcium  Date Value Ref Range Status  01/04/2019 8.1 (L) 8.7 - 10.2 mg/dL Final         Failed - Cr in normal range and within 360 days    Creat  Date Value Ref Range Status  08/24/2013 1.16 0.50 - 1.35 mg/dL Final   Creatinine, Ser  Date Value Ref Range Status  01/04/2019 2.66 (H) 0.76 - 1.27 mg/dL Final         Failed - Last BP in normal range    BP Readings from Last 1 Encounters:  01/18/19 (!) 170/90         Passed - K in normal range and within 360 days    Potassium  Date Value Ref Range Status  01/04/2019 4.9 3.5 - 5.2 mmol/L Final         Passed - Na in normal range and within 360 days    Sodium  Date Value Ref Range Status  01/04/2019 143 134 - 144 mmol/L Final         Passed - Valid encounter within last 6 months    Recent Outpatient Visits          Today Essential hypertension   [facility], [place], NP   2 weeks ago Screening for endocrine, metabolic and immunity disorder   [facility], Delfino Lovett, NP   1 year ago 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   [facility], Laurey Arrow, MD   1 year ago Essential hypertension   [facility], Laurey Arrow, MD   2 years ago Essential hypertension   [facility] at [place], [place] D, PA-C      Future Appointments            In 1 week Gwenlyn Found, Pearletha Forge, MD [place],  [facility]   In 4 weeks Maximiano Coss, NP [facility] at [place], [place]            lisinopril (ZESTRIL) 10 MG tablet [Pharmacy Med Name: LISINOPRIL 10MG  TABLETS] 90 tablet     Sig: TAKE 1 TABLET(10 MG) BY MOUTH DAILY     Cardiovascular:  ACE Inhibitors Failed - 01/18/2019  9:58 AM      Failed - Cr in normal range and within 180 days    Creat  Date Value Ref Range Status  08/24/2013 1.16 0.50 - 1.35 mg/dL Final   Creatinine, Ser  Date Value Ref Range Status  01/04/2019 2.66 (H) 0.76 - 1.27 mg/dL Final         Failed - Last BP in normal range    BP Readings from Last 1 Encounters:  01/18/19 (!) 170/90         Passed - K in normal range and within 180 days    Potassium  Date Value Ref Range Status  01/04/2019 4.9 3.5 - 5.2 mmol/L Final         Passed - Patient is not pregnant      Passed - Valid encounter within last 6 months    Recent Outpatient Visits          Today Essential hypertension   [facility], Delfino Lovett, NP   2 weeks ago Screening for endocrine, metabolic and immunity disorder   [facility], Delfino Lovett, NP   1 year ago 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

## 2019-01-18 NOTE — Progress Notes (Signed)
Established Patient Office Visit  Subjective:  Patient ID: Hector Brooks, male    DOB: 02/04/1971  Age: 48 y.o. MRN: [id number]  CC:  Chief Complaint  Patient presents with  . Hypertension    2 weeks follow-up   . Medication Management    HPI JAISHON KRISHER presents for 2 week recheck on symptoms and BP  Notes that unfortunately he was unable to pick up lisinopril and furosemide from pharmacy - we have resent these prescriptions.   Reports feeling about the same, perhaps better, but no concerns about feeling worse  We will repeat CBC and CMP to monitor abnormalities in kidney function and anemia we found at last visit.  He has appt next week with Dr. Gwenlyn Found in cardiology and an appt in December with his nephrologist.   Past Medical History:  Diagnosis Date  . Chronic kidney disease   . 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 MENISCUS REPAIR Right 12/14    Family History  Problem Relation Age of Onset  . Cancer Father   . Diabetes Father   . Hyperlipidemia Sister   . Diabetes Maternal Grandfather   . Heart disease Maternal Grandfather     Social History   Socioeconomic History  . Marital status: Married    Spouse name: Not on file  . Number of children: Not on file  . Years of education: Not on file  . Highest education level: Not on file  Occupational History  . Not on file  Social Needs  . Financial resource strain: Not on file  . Food insecurity    Worry: Not on file    Inability: Not on file  . Transportation needs    Medical: Not on file    Non-medical: Not on file  Tobacco Use  . Smoking status: Never Smoker  . Smokeless tobacco: Never Used  Substance and Sexual Activity  . Alcohol use: No    Alcohol/week: 0.0 standard drinks  . Drug use: No  . Sexual activity: Yes  Lifestyle  . Physical activity    Days per  week: Not on file    Minutes per session: Not on file  . Stress: Not on file  Relationships  . Social Herbalist on phone: Not on file    Gets together: Not on file    Attends religious service: Not on file    Active member of club or organization: Not on file    Attends meetings of clubs or organizations: Not on file    Relationship status: Not on file  . Intimate partner violence    Fear of current or ex partner: Not on file    Emotionally abused: Not on file    Physically abused: Not on file    Forced sexual activity: Not on file  Other Topics Concern  . Not on file  Social History Narrative  . Not on file    Outpatient Medications Prior to Visit  Medication Sig Dispense Refill  . albuterol (VENTOLIN HFA) 108 (90 Base) MCG/ACT inhaler Inhale 2 puffs into the lungs every 4 (four) hours as needed for wheezing or shortness of breath. 18 g 0  . atorvastatin (LIPITOR) 40 MG tablet Take 1 tablet (40 mg total) by mouth daily. 90 tablet 3  . labetalol (NORMODYNE) 200 MG tablet TAKE 1 TABLET(200 MG) BY MOUTH TWICE  DAILY 180 tablet 0  . Multiple Vitamin (MULTIVITAMIN) tablet Take 1 tablet by mouth daily.    . potassium chloride SA (K-DUR,KLOR-CON) 20 MEQ tablet TAKE 1 TABLET(20 MEQ) BY MOUTH DAILY 30 tablet 0  . furosemide (LASIX) 40 MG tablet TAKE 2 TABLETS BY MOUTH EVERY MORNING AND 1 TABLET WITH LUNCH 270 tablet 0  . lisinopril (ZESTRIL) 40 MG tablet Take 40 mg by mouth daily.     No facility-administered medications prior to visit.     No Known Allergies  ROS Review of Systems  Constitutional: Negative.   HENT: Negative.   Eyes: Negative.   Respiratory: Negative.   Cardiovascular: Negative.   Gastrointestinal: Negative.   Endocrine: Negative.   Genitourinary: Negative.   Musculoskeletal: Negative.   Skin: Negative.   Allergic/Immunologic: Negative.   Neurological: Negative.   Hematological: Negative.   Psychiatric/Behavioral: Negative.   All other systems  reviewed and are negative.     Objective:    Physical Exam  Constitutional: He is oriented to person, place, and time. He appears well-developed and well-nourished. No distress.  Cardiovascular: Normal rate, regular rhythm and normal heart sounds. Exam reveals no gallop and no friction rub.  No murmur heard. Pulmonary/Chest: Effort normal and breath sounds normal. No respiratory distress. He has no wheezes. He has no rales. He exhibits no tenderness.  Musculoskeletal:        General: Edema (ble) present.  Neurological: He is alert and oriented to person, place, and time.  Skin: He is not diaphoretic.  Psychiatric: He has a normal mood and affect. His behavior is normal. Judgment and thought content normal.  Nursing note and vitals reviewed.   BP (!) 170/90   Pulse (!) 115   Temp 98.5 F (36.9 C) (Oral)   Resp 18   Wt (!) 340 lb (154.2 kg)   SpO2 94%   BMI 50.21 kg/m  Wt Readings from Last 3 Encounters:  01/18/19 (!) 340 lb (154.2 kg)  01/04/19 (!) 348 lb 12.8 oz (158.2 kg)  02/01/18 (!) 320 lb (145.2 kg)     There are no preventive care reminders to display for this patient.  There are no preventive care reminders to display for this patient.  Lab Results  Component Value Date   TSH 3.700 01/04/2019   Lab Results  Component Value Date   WBC 5.6 01/04/2019   HGB 10.2 (L) 01/04/2019   HCT 31.7 (L) 01/04/2019   MCV 90 01/04/2019   PLT 208 01/04/2019   Lab Results  Component Value Date   NA 143 01/04/2019   K 4.9 01/04/2019   CO2 20 01/04/2019   GLUCOSE 123 (H) 01/04/2019   BUN 19 01/04/2019   CREATININE 2.66 (H) 01/04/2019   BILITOT 0.2 01/04/2019   ALKPHOS 62 01/04/2019   AST 18 01/04/2019   ALT 26 01/04/2019   PROT 4.9 (L) 01/04/2019   ALBUMIN 2.8 (L) 01/04/2019   CALCIUM 8.1 (L) 01/04/2019   Lab Results  Component Value Date   CHOL 264 (H) 01/04/2019   Lab Results  Component Value Date   HDL 55 01/04/2019   Lab Results  Component Value Date    LDLCALC 191 (H) 01/04/2019   Lab Results  Component Value Date   TRIG 103 01/04/2019   Lab Results  Component Value Date   CHOLHDL 4.8 01/04/2019   Lab Results  Component Value Date   HGBA1C 5.9 (H) 01/04/2019      Assessment & Plan:   Problem List  Items Addressed This Visit      Cardiovascular and Mediastinum   HTN (hypertension) - Primary   Relevant Medications   furosemide (LASIX) 20 MG tablet   lisinopril (ZESTRIL) 10 MG tablet    Other Visit Diagnoses    Kidney function abnormal       Relevant Orders   CBC   Comprehensive metabolic panel      Meds ordered this encounter  Medications  . furosemide (LASIX) 20 MG tablet    Sig: Take 1 tablet (20 mg total) by mouth daily.    Dispense:  30 tablet    Refill:  0    Order Specific Question:   Supervising Provider    Answer:   Delia Chimes A [id number]  . lisinopril (ZESTRIL) 10 MG tablet    Sig: Take 1 tablet (10 mg total) by mouth daily.    Dispense:  30 tablet    Refill:  0    Order Specific Question:   Supervising Provider    Answer:   Forrest Moron [id number]    Follow-up: No follow-ups on file.   PLAN  Start on furosemide 20mg  PO qd, may increase after 3 days to 20mg  PO bid for relief of edema.  Start lisinopril 10mg  PO qd  Follow up with cardiology and nephrology  Labs drawn, will follow up as warranted  Patient encouraged to call clinic with any questions, comments, or concerns.    Maximiano Coss, NP

## 2019-01-19 LAB — COMPREHENSIVE METABOLIC PANEL
ALT: 27 IU/L (ref 0–44)
AST: 21 IU/L (ref 0–40)
Albumin/Globulin Ratio: 1.6 (ref 1.2–2.2)
Albumin: 3.1 g/dL — ABNORMAL LOW (ref 4.0–5.0)
Alkaline Phosphatase: 69 IU/L (ref 39–117)
BUN/Creatinine Ratio: 9 (ref 9–20)
BUN: 22 mg/dL (ref 6–24)
Bilirubin Total: 0.3 mg/dL (ref 0.0–1.2)
CO2: 24 mmol/L (ref 20–29)
Calcium: 8.3 mg/dL — ABNORMAL LOW (ref 8.7–10.2)
Chloride: 109 mmol/L — ABNORMAL HIGH (ref 96–106)
Creatinine, Ser: 2.46 mg/dL — ABNORMAL HIGH (ref 0.76–1.27)
GFR calc Af Amer: 35 mL/min/{1.73_m2} — ABNORMAL LOW (ref >59)
GFR calc non Af Amer: 30 mL/min/{1.73_m2} — ABNORMAL LOW (ref >59)
Globulin, Total: 1.9 g/dL (ref 1.5–4.5)
Glucose: 142 mg/dL — ABNORMAL HIGH (ref 65–99)
Potassium: 4.8 mmol/L (ref 3.5–5.2)
Sodium: 144 mmol/L (ref 134–144)
Total Protein: 5 g/dL — ABNORMAL LOW (ref 6.0–8.5)

## 2019-01-19 LAB — CBC
Hematocrit: 31.6 % — ABNORMAL LOW (ref 37.5–51.0)
Hemoglobin: 10.3 g/dL — ABNORMAL LOW (ref 13.0–17.7)
MCH: 29.4 pg (ref 26.6–33.0)
MCHC: 32.6 g/dL (ref 31.5–35.7)
MCV: 90 fL (ref 79–97)
Platelets: 198 10*3/uL (ref 150–450)
RBC: 3.5 x10E6/uL — ABNORMAL LOW (ref 4.14–5.80)
RDW: 12.7 % (ref 11.6–15.4)
WBC: 5 10*3/uL (ref 3.4–10.8)

## 2019-01-19 NOTE — Progress Notes (Signed)
Good morning, If someone wouldn't mind giving Hector Brooks a call to let him know his labs are holding steady and there are no changes to be made at this time, that would be great. Please remind him that he has a cardiology appointment next Tuesday at 8:45am. Thank you so much! Kathrin Ruddy, NP

## 2019-01-25 ENCOUNTER — Ambulatory Visit: Payer: Federal, State, Local not specified - PPO | Admitting: Cardiovascular Disease

## 2019-01-25 ENCOUNTER — Other Ambulatory Visit: Payer: Self-pay

## 2019-01-25 ENCOUNTER — Encounter: Payer: Self-pay | Admitting: Cardiovascular Disease

## 2019-01-25 VITALS — BP 183/123 | HR 98 | Temp 98.1°F | Ht 70.0 in | Wt 345.6 lb

## 2019-01-25 DIAGNOSIS — E7800 Pure hypercholesterolemia, unspecified: Secondary | ICD-10-CM

## 2019-01-25 DIAGNOSIS — I10 Essential (primary) hypertension: Secondary | ICD-10-CM

## 2019-01-25 DIAGNOSIS — E785 Hyperlipidemia, unspecified: Secondary | ICD-10-CM | POA: Diagnosis not present

## 2019-01-25 DIAGNOSIS — G4733 Obstructive sleep apnea (adult) (pediatric): Secondary | ICD-10-CM | POA: Diagnosis not present

## 2019-01-25 DIAGNOSIS — I509 Heart failure, unspecified: Secondary | ICD-10-CM

## 2019-01-25 MED ORDER — ATORVASTATIN CALCIUM 80 MG PO TABS: 40.0000 mg | ORAL_TABLET | Freq: Every day | ORAL | 3 refills | Status: DC

## 2019-01-25 MED ORDER — LABETALOL HCL 300 MG PO TABS: ORAL_TABLET | ORAL | 3 refills | Status: DC

## 2019-01-25 MED ORDER — AMLODIPINE BESYLATE 5 MG PO TABS: 2.5000 mg | ORAL_TABLET | Freq: Every day | ORAL | 3 refills | Status: DC

## 2019-01-25 NOTE — Assessment & Plan Note (Signed)
History of morbid obesity with a BMI of approximately 50, nocturnal snoring and daytime somnolence.  I suspect he does have sleep apnea and will need CPAP.  I suspect this also is contributing to his hypertension.  We will check an outpatient sleep study.

## 2019-01-25 NOTE — Progress Notes (Signed)
01/25/2019 Hector Brooks   10-31-1970  [id number]  Primary Physician Maximiano Coss, NP Primary Cardiologist: Lorretta Harp MD Lupe Carney, [place]  HPI:  Hector Brooks is a 48 y.o. morbidly overweight married African-American male father of 5 children, grandfather one grandchild who works as a Therapist, sports carrier and was referred by Maximiano Coss NP for evaluation treatment of resistant hypertension.  He has a history of resistant hypertension, hyperlipidemia and morbid obesity.  He also has moderate renal insufficiency with a creatinine in the mid 2 range.  He does see a nephrologist.  He has no other cardiac risk factors.  There is no family history for heart disease.  Never had a heart attack or stroke.  He denies chest pain or shortness of breath.  He does have symptoms of obstructive sleep apnea with nocturnal snoring and daytime somnolence as well as a body habitus consistent with this.   Current Meds  Medication Sig  . albuterol (VENTOLIN HFA) 108 (90 Base) MCG/ACT inhaler Inhale 2 puffs into the lungs every 4 (four) hours as needed for wheezing or shortness of breath.  . furosemide (LASIX) 20 MG tablet Take 1 tablet (20 mg total) by mouth daily.  Marland Kitchen labetalol (NORMODYNE) 200 MG tablet TAKE 1 TABLET(200 MG) BY MOUTH TWICE DAILY  . lisinopril (ZESTRIL) 10 MG tablet Take 1 tablet (10 mg total) by mouth daily.  . Multiple Vitamin (MULTIVITAMIN) tablet Take 1 tablet by mouth daily.     No Known Allergies  Social History   Socioeconomic History  . Marital status: Married    Spouse name: Not on file  . Number of children: Not on file  . Years of education: Not on file  . Highest education level: Not on file  Occupational History  . Not on file  Social Needs  . Financial resource strain: Not on file  . Food insecurity    Worry: Not on file    Inability: Not on file  . Transportation needs    Medical: Not on file    Non-medical: Not on file  Tobacco Use  .  Smoking status: Never Smoker  . Smokeless tobacco: Never Used  Substance and Sexual Activity  . Alcohol use: No    Alcohol/week: 0.0 standard drinks  . Drug use: No  . Sexual activity: Yes  Lifestyle  . Physical activity    Days per week: Not on file    Minutes per session: Not on file  . Stress: Not on file  Relationships  . Social Herbalist on phone: Not on file    Gets together: Not on file    Attends religious service: Not on file    Active member of club or organization: Not on file    Attends meetings of clubs or organizations: Not on file    Relationship status: Not on file  . Intimate partner violence    Fear of current or ex partner: Not on file    Emotionally abused: Not on file    Physically abused: Not on file    Forced sexual activity: Not on file  Other Topics Concern  . Not on file  Social History Narrative  . Not on file     Review of Systems: General: negative for chills, fever, night sweats or weight changes.  Cardiovascular: negative for chest pain, dyspnea on exertion, edema, orthopnea, palpitations, paroxysmal nocturnal dyspnea or shortness of breath Dermatological: negative for rash Respiratory: negative for  cough or wheezing Urologic: negative for hematuria Abdominal: negative for nausea, vomiting, diarrhea, bright red blood per rectum, melena, or hematemesis Neurologic: negative for visual changes, syncope, or dizziness All other systems reviewed and are otherwise negative except as noted above.    Blood pressure (!) 190/135, pulse 98, temperature 98.1 F (36.7 C), height 5\' 10"  (1.778 m), weight (!) 345 lb 9.6 oz (156.8 kg), SpO2 96 %.  General appearance: alert and no distress Neck: no adenopathy, no carotid bruit, no JVD, supple, symmetrical, trachea midline and thyroid not enlarged, symmetric, no tenderness/mass/nodules Lungs: clear to auscultation bilaterally Heart: regular rate and rhythm, S1, S2 normal, no murmur, click, rub or  gallop Extremities: extremities normal, atraumatic, no cyanosis or edema Pulses: 2+ and symmetric Skin: Skin color, texture, turgor normal. No rashes or lesions Neurologic: Alert and oriented X 3, normal strength and tone. Normal symmetric reflexes. Normal coordination and gait  EKG not performed today  ASSESSMENT AND PLAN:   HTN (hypertension) 10-year history of essential hypertension on labetalol and lisinopril with chronic renal insufficiency and a blood pressure measured today at 190/135.  I am going to increase his labetalol from 2 to 300 mg twice a day, and amlodipine 5 mg a day and have him keep a blood pressure log for next 30 days.  He will see Cyril Mourning back in the office for medicine titration.  We will also get a 2D echo looking for LVH and renal Doppler studies looking for renal artery stenosis.  Pure hypercholesterolemia History of hyperlipidemia on statin therapy with lipid profile performed 01/04/2019 revealing total cholesterol 264, LDL of 151 and HDL of 55.  I am going to increase his atorvastatin from 40 to 80 mg a day and we will recheck a lipid liver profile in 2 months  Obstructive sleep apnea History of morbid obesity with a BMI of approximately 50, nocturnal snoring and daytime somnolence.  I suspect he does have sleep apnea and will need CPAP.  I suspect this also is contributing to his hypertension.  We will check an outpatient sleep study.      Lorretta Harp MD FACP,FACC,FAHA, FSCAI 01/25/2019 10:00 AM

## 2019-01-25 NOTE — Assessment & Plan Note (Signed)
10-year history of essential hypertension on labetalol and lisinopril with chronic renal insufficiency and a blood pressure measured today at 190/135.  I am going to increase his labetalol from 2 to 300 mg twice a day, and amlodipine 5 mg a day and have him keep a blood pressure log for next 30 days.  He will see Cyril Mourning back in the office for medicine titration.  We will also get a 2D echo looking for LVH and renal Doppler studies looking for renal artery stenosis.

## 2019-01-25 NOTE — Assessment & Plan Note (Signed)
History of hyperlipidemia on statin therapy with lipid profile performed 01/04/2019 revealing total cholesterol 264, LDL of 151 and HDL of 55.  I am going to increase his atorvastatin from 40 to 80 mg a day and we will recheck a lipid liver profile in 2 months

## 2019-01-25 NOTE — Patient Instructions (Addendum)
Medication Instructions:  Start taking Amlodipine to 5mg  Daily. Increase Atorvastatin to 80mg  Daily. Increase Labetalol to 300mg  Twice Daily.   If you need a refill on your cardiac medications before your next appointment, please call your pharmacy.   Lab work: Lipids and Hepatic Function in 2 months If you have labs (blood work) drawn today and your tests are completely normal, you will receive your results only by: [place] (if you have MyChart) OR A paper copy in the mail If you have any lab test that is abnormal or we need to change your treatment, we will call you to review the results.  Testing/Procedures:  Your physician has requested that you have a renal artery duplex. During this test, an ultrasound is used to evaluate blood flow to the kidneys. Allow one hour for this exam. Do not eat after midnight the day before and avoid carbonated beverages. Take your medications as you usually do.  AND  Your physician has requested that you have an echocardiogram. Echocardiography is a painless test that uses sound waves to create images of your heart. It provides your doctor with information about the size and shape of your heart and how well your heart's chambers and valves are working. This procedure takes approximately one hour. There are no restrictions for this procedure. [place] [address]   Follow-Up: At [facility], you and your health needs are our priority.  As part of our continuing mission to provide you with exceptional heart care, we have created designated Provider Care Teams.  These Care Teams include your primary Cardiologist (physician) and Advanced Practice Providers (APPs -  Physician Assistants and Nurse Practitioners) who all work together to provide you with the care you need, when you need it. You may see Dr Gwenlyn Found or one of the following Advanced Practice Providers on your designated Care Team:    Kerin Ransom, PA-C  [place],  [place]  Coletta Memos, [place]  Your physician wants you to follow-up in: 3 months. You will receive a reminder letter in the mail two months in advance. If you don't receive a letter, please call our office to schedule the follow-up appointment.   Any Other Special Instructions Will Be Listed Below (If Applicable). Keep Blood Pressure Log for 30 days and follow-up with Erasmo Downer from Pharmacy on Dec. 17th @ 10:30am  Get a [employer] Blood Pressure Machine  You have been referred to Sleep Studies

## 2019-01-26 NOTE — Progress Notes (Signed)
Spoke wit pt, pleased to hear of his results. He is aware of his cards appt

## 2019-01-31 ENCOUNTER — Other Ambulatory Visit: Payer: Self-pay | Admitting: Cardiovascular Disease

## 2019-01-31 DIAGNOSIS — R0683 Snoring: Secondary | ICD-10-CM

## 2019-01-31 DIAGNOSIS — I10 Essential (primary) hypertension: Secondary | ICD-10-CM

## 2019-01-31 DIAGNOSIS — R400 Somnolence: Secondary | ICD-10-CM

## 2019-02-04 DIAGNOSIS — E559 Vitamin D deficiency, unspecified: Secondary | ICD-10-CM | POA: Diagnosis not present

## 2019-02-04 DIAGNOSIS — N1832 Chronic kidney disease, stage 3b: Secondary | ICD-10-CM | POA: Diagnosis not present

## 2019-02-04 DIAGNOSIS — N052 Unspecified nephritic syndrome with diffuse membranous glomerulonephritis: Secondary | ICD-10-CM | POA: Diagnosis not present

## 2019-02-04 DIAGNOSIS — I129 Hypertensive chronic kidney disease with stage 1 through stage 4 chronic kidney disease, or unspecified chronic kidney disease: Secondary | ICD-10-CM | POA: Diagnosis not present

## 2019-02-08 ENCOUNTER — Other Ambulatory Visit: Payer: Self-pay

## 2019-02-08 ENCOUNTER — Ambulatory Visit (HOSPITAL_COMMUNITY): Payer: Federal, State, Local not specified - PPO | Attending: Cardiology

## 2019-02-08 DIAGNOSIS — E7800 Pure hypercholesterolemia, unspecified: Secondary | ICD-10-CM | POA: Insufficient documentation

## 2019-02-08 DIAGNOSIS — E785 Hyperlipidemia, unspecified: Secondary | ICD-10-CM

## 2019-02-08 DIAGNOSIS — G4733 Obstructive sleep apnea (adult) (pediatric): Secondary | ICD-10-CM | POA: Insufficient documentation

## 2019-02-08 DIAGNOSIS — I10 Essential (primary) hypertension: Secondary | ICD-10-CM | POA: Diagnosis not present

## 2019-02-14 ENCOUNTER — Telehealth: Payer: Self-pay | Admitting: *Deleted

## 2019-02-14 NOTE — Telephone Encounter (Signed)
Called and asked patient to give me the "800" number from the back of his insurance card and his subscriber # that begins with "R". He doesn't have it with him. He will call back and leave it on my VM once he gets home.

## 2019-02-15 ENCOUNTER — Encounter: Payer: Self-pay | Admitting: Registered Nurse

## 2019-02-15 ENCOUNTER — Other Ambulatory Visit: Payer: Self-pay

## 2019-02-15 ENCOUNTER — Encounter (HOSPITAL_COMMUNITY): Payer: Federal, State, Local not specified - PPO

## 2019-02-15 ENCOUNTER — Ambulatory Visit: Payer: Federal, State, Local not specified - PPO | Admitting: Registered Nurse

## 2019-02-15 DIAGNOSIS — I10 Essential (primary) hypertension: Secondary | ICD-10-CM | POA: Diagnosis not present

## 2019-02-15 DIAGNOSIS — Z862 Personal history of diseases of the blood and blood-forming organs and certain disorders involving the immune mechanism: Secondary | ICD-10-CM | POA: Diagnosis not present

## 2019-02-15 DIAGNOSIS — N183 Chronic kidney disease, stage 3 unspecified: Secondary | ICD-10-CM | POA: Diagnosis not present

## 2019-02-15 MED ORDER — LISINOPRIL 10 MG PO TABS: 10.0000 mg | ORAL_TABLET | Freq: Every day | ORAL | 1 refills | Status: DC

## 2019-02-15 MED ORDER — FUROSEMIDE 40 MG PO TABS: 40.0000 mg | ORAL_TABLET | Freq: Every day | ORAL | 1 refills | Status: DC

## 2019-02-15 NOTE — Progress Notes (Signed)
Established Patient Office Visit  Subjective:  Patient ID: Hector Brooks, male    DOB: 1971/02/16  Age: 48 y.o. MRN: [id number]  CC:  Chief Complaint  Patient presents with  . Hypertension    Pt stated ----have headache with blurr vision on and off .    HPI Hector Brooks presents for 4 mo recheck on htn  Feels well overall. Bought home cuff, unfortunately still getting high readings Ran out of furosemide around 1 week ago. Noticed some dependent edema as a result.  BP very elevated upon arrival today at 187/131 but denies chest pain, visual changes, headache.  He is going to get labs drawn for his nephrologist this afternoon. We are hoping to have records sent to our office.  He was able to see Dr. Gwenlyn Found cardiology one week after our last visit. He notes the visit went well. Dr. Gwenlyn Found sent him for a sleep study which he is hoping to get done soon. He has a follow up with Dr. Gwenlyn Found in 3 months.  Past Medical History:  Diagnosis Date  . Chronic kidney disease   . 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 MENISCUS REPAIR Right 12/14    Family History  Problem Relation Age of Onset  . Cancer Father   . Diabetes Father   . Hyperlipidemia Sister   . Diabetes Maternal Grandfather   . Heart disease Maternal Grandfather     Social History   Socioeconomic History  . Marital status: Married    Spouse name: Not on file  . Number of children: Not on file  . Years of education: Not on file  . Highest education level: Not on file  Occupational History  . Not on file  Tobacco Use  . Smoking status: Never Smoker  . Smokeless tobacco: Never Used  Substance and Sexual Activity  . Alcohol use: No    Alcohol/week: 0.0 standard drinks  . Drug use: No  . Sexual activity: Yes  Other Topics Concern  . Not on file  Social History Narrative  . Not on  file   Social Determinants of Health   Financial Resource Strain:   . Difficulty of Paying Living Expenses: Not on file  Food Insecurity:   . Worried About Charity fundraiser in the Last Year: Not on file  . Ran Out of Food in the Last Year: Not on file  Transportation Needs:   . Lack of Transportation (Medical): Not on file  . Lack of Transportation (Non-Medical): Not on file  Physical Activity:   . Days of Exercise per Week: Not on file  . Minutes of Exercise per Session: Not on file  Stress:   . Feeling of Stress : Not on file  Social Connections:   . Frequency of Communication with Friends and Family: Not on file  . Frequency of Social Gatherings with Friends and Family: Not on file  . Attends Religious Services: Not on file  . Active Member of Clubs or Organizations: Not on file  . Attends Archivist Meetings: Not on file  . Marital Status: Not on file  Intimate Partner Violence:   . Fear of Current or Ex-Partner: Not on file  . Emotionally Abused: Not on file  . Physically Abused: Not on file  . Sexually Abused: Not on file    Outpatient Medications Prior to Visit  Medication Sig Dispense Refill  . albuterol (VENTOLIN HFA) 108 (90 Base) MCG/ACT inhaler Inhale 2 puffs into the lungs every 4 (four) hours as needed for wheezing or shortness of breath. 18 g 0  . amLODipine (NORVASC) 5 MG tablet Take 0.5 tablets (2.5 mg total) by mouth daily. 90 tablet 3  . atorvastatin (LIPITOR) 80 MG tablet Take 0.5 tablets (40 mg total) by mouth daily. 90 tablet 3  . labetalol (NORMODYNE) 300 MG tablet TAKE 1 TABLET(200 MG) BY MOUTH TWICE DAILY 180 tablet 3  . Multiple Vitamin (MULTIVITAMIN) tablet Take 1 tablet by mouth daily.    . furosemide (LASIX) 20 MG tablet Take 1 tablet (20 mg total) by mouth daily. 30 tablet 0  . lisinopril (ZESTRIL) 10 MG tablet Take 1 tablet (10 mg total) by mouth daily. 30 tablet 0  . potassium chloride SA (K-DUR,KLOR-CON) 20 MEQ tablet TAKE 1  TABLET(20 MEQ) BY MOUTH DAILY (Patient not taking: Reported on 02/15/2019) 30 tablet 0   No facility-administered medications prior to visit.    No Known Allergies  ROS Review of Systems  Constitutional: Negative.   HENT: Negative.   Eyes: Negative.   Respiratory: Negative.   Cardiovascular: Negative.   Gastrointestinal: Negative.   Endocrine: Negative.   Genitourinary: Negative.   Musculoskeletal: Negative.   Skin: Negative.   Allergic/Immunologic: Negative.   Neurological: Negative.   Hematological: Negative.   Psychiatric/Behavioral: Negative.   All other systems reviewed and are negative.     Objective:    Physical Exam  Constitutional: He is oriented to person, place, and time. He appears well-developed and well-nourished. No distress.  Cardiovascular: Normal rate and regular rhythm.  Pulmonary/Chest: Effort normal. No respiratory distress.  Neurological: He is alert and oriented to person, place, and time.  Skin: Skin is warm and dry. No rash noted. He is not diaphoretic. No erythema. No pallor.  Psychiatric: He has a normal mood and affect. His behavior is normal. Judgment and thought content normal.  Nursing note and vitals reviewed.   BP (!) 187/131 (BP Location: Right Arm, Patient Position: Sitting, Cuff Size: Large)   Pulse 86   Temp 98.6 F (37 C)   Resp 12   Ht 5\' 10"  (1.778 m)   Wt (!) 339 lb (153.8 kg)   SpO2 98%   BMI 48.64 kg/m  Wt Readings from Last 3 Encounters:  02/15/19 (!) 339 lb (153.8 kg)  01/25/19 (!) 345 lb 9.6 oz (156.8 kg)  01/18/19 (!) 340 lb (154.2 kg)     There are no preventive care reminders to display for this patient.  There are no preventive care reminders to display for this patient.  Lab Results  Component Value Date   TSH 3.700 01/04/2019   Lab Results  Component Value Date   WBC 5.0 01/18/2019   HGB 10.3 (L) 01/18/2019   HCT 31.6 (L) 01/18/2019   MCV 90 01/18/2019   PLT 198 01/18/2019   Lab Results    Component Value Date   NA 144 01/18/2019   K 4.8 01/18/2019   CO2 24 01/18/2019   GLUCOSE 142 (H) 01/18/2019   BUN 22 01/18/2019   CREATININE 2.46 (H) 01/18/2019   BILITOT 0.3 01/18/2019   ALKPHOS 69 01/18/2019   AST 21 01/18/2019   ALT 27 01/18/2019   PROT 5.0 (L) 01/18/2019   ALBUMIN 3.1 (L) 01/18/2019   CALCIUM 8.3 (L) 01/18/2019   Lab Results  Component Value Date   CHOL 264 (H) 01/04/2019  Lab Results  Component Value Date   HDL 55 01/04/2019   Lab Results  Component Value Date   LDLCALC 191 (H) 01/04/2019   Lab Results  Component Value Date   TRIG 103 01/04/2019   Lab Results  Component Value Date   CHOLHDL 4.8 01/04/2019   Lab Results  Component Value Date   HGBA1C 5.9 (H) 01/04/2019      Assessment & Plan:   Problem List Items Addressed This Visit      Cardiovascular and Mediastinum   HTN (hypertension)   Relevant Medications   lisinopril (ZESTRIL) 10 MG tablet   furosemide (LASIX) 40 MG tablet      Meds ordered this encounter  Medications  . lisinopril (ZESTRIL) 10 MG tablet    Sig: Take 1 tablet (10 mg total) by mouth daily.    Dispense:  90 tablet    Refill:  1  . furosemide (LASIX) 40 MG tablet    Sig: Take 1 tablet (40 mg total) by mouth daily.    Dispense:  90 tablet    Refill:  1    Order Specific Question:   Supervising Provider    Answer:   Forrest Moron [id number]    Follow-up: No follow-ups on file.   PLAN  Will increase furosemide to 40mg  PO qd. Will continue other medications at current dose.  Will plan to see him in around 6 mo - hoping to stagger visits with cardiology and nephrology. He will come sooner with any concerns  Again reviewed ED precautions  Patient encouraged to call clinic with any questions, comments, or concerns.  Maximiano Coss, NP

## 2019-02-15 NOTE — Patient Instructions (Signed)

## 2019-02-22 ENCOUNTER — Other Ambulatory Visit: Payer: Self-pay

## 2019-02-22 ENCOUNTER — Ambulatory Visit (HOSPITAL_COMMUNITY)
Admission: RE | Admit: 2019-02-22 | Discharge: 2019-02-22 | Disposition: A | Discharge status: Home / Self Care | Payer: Federal, State, Local not specified - PPO | Source: Ambulatory Visit | Attending: Cardiology | Admitting: Cardiology

## 2019-02-22 DIAGNOSIS — E7800 Pure hypercholesterolemia, unspecified: Secondary | ICD-10-CM

## 2019-02-22 DIAGNOSIS — I10 Essential (primary) hypertension: Secondary | ICD-10-CM | POA: Insufficient documentation

## 2019-02-22 DIAGNOSIS — G4733 Obstructive sleep apnea (adult) (pediatric): Secondary | ICD-10-CM | POA: Diagnosis not present

## 2019-02-22 DIAGNOSIS — E785 Hyperlipidemia, unspecified: Secondary | ICD-10-CM

## 2019-03-07 ENCOUNTER — Other Ambulatory Visit (HOSPITAL_COMMUNITY): Payer: Self-pay | Admitting: *Deleted

## 2019-03-08 ENCOUNTER — Encounter (HOSPITAL_COMMUNITY)
Admission: RE | Admit: 2019-03-08 | Discharge: 2019-03-08 | Disposition: A | Discharge status: Home / Self Care | Payer: Federal, State, Local not specified - PPO | Source: Ambulatory Visit | Attending: Nephrology | Admitting: Nephrology

## 2019-03-08 ENCOUNTER — Other Ambulatory Visit: Payer: Self-pay

## 2019-03-08 DIAGNOSIS — N052 Unspecified nephritic syndrome with diffuse membranous glomerulonephritis: Secondary | ICD-10-CM | POA: Insufficient documentation

## 2019-03-08 MED ORDER — METHYLPREDNISOLONE SODIUM SUCC 125 MG IJ SOLR
INTRAMUSCULAR | Status: AC
  Filled 2019-03-08: qty 2

## 2019-03-08 MED ORDER — DIPHENHYDRAMINE HCL 50 MG/ML IJ SOLN
25.0000 mg | INTRAMUSCULAR | Status: DC
  Administered 2019-03-08: 25 mg via INTRAVENOUS

## 2019-03-08 MED ORDER — ACETAMINOPHEN 325 MG PO TABS
ORAL_TABLET | ORAL | Status: AC
  Filled 2019-03-08: qty 2

## 2019-03-08 MED ORDER — DIPHENHYDRAMINE HCL 50 MG/ML IJ SOLN
INTRAMUSCULAR | Status: AC
  Filled 2019-03-08: qty 1

## 2019-03-08 MED ORDER — SODIUM CHLORIDE 0.9 % IV SOLN
1000.0000 mg | INTRAVENOUS | Status: DC
  Administered 2019-03-08: 1000 mg via INTRAVENOUS
  Filled 2019-03-08: qty 100

## 2019-03-08 MED ORDER — ACETAMINOPHEN 325 MG PO TABS
650.0000 mg | ORAL_TABLET | ORAL | Status: DC
  Administered 2019-03-08: 650 mg via ORAL

## 2019-03-08 MED ORDER — METHYLPREDNISOLONE SODIUM SUCC 125 MG IJ SOLR
125.0000 mg | INTRAMUSCULAR | Status: DC
  Administered 2019-03-08: 125 mg via INTRAVENOUS

## 2019-03-08 NOTE — Discharge Instructions (Signed)
Rituximab injection What is this medicine? RITUXIMAB (ri TUX i mab) is a monoclonal antibody. It is used to treat certain types of cancer like non-Hodgkin lymphoma and chronic lymphocytic leukemia. It is also used to treat rheumatoid arthritis, granulomatosis with polyangiitis (or Wegener's granulomatosis), microscopic polyangiitis, and pemphigus vulgaris. This medicine may be used for other purposes; ask your health care provider or pharmacist if you have questions. COMMON BRAND NAME(S): Rituxan, RUXIENCE What should I tell my health care provider before I take this medicine? They need to know if you have any of these conditions:  heart disease  infection (especially a virus infection such as hepatitis B, chickenpox, cold sores, or herpes)  immune system problems  irregular heartbeat  kidney disease  low blood counts, like low white cell, platelet, or red cell counts  lung or breathing disease, like asthma  recently received or scheduled to receive a vaccine  an unusual or allergic reaction to rituximab, other medicines, foods, dyes, or preservatives  pregnant or trying to get pregnant  breast-feeding How should I use this medicine? This medicine is for infusion into a vein. It is administered in a hospital or clinic by a specially trained health care professional. A special MedGuide will be given to you by the pharmacist with each prescription and refill. Be sure to read this information carefully each time. Talk to your pediatrician regarding the use of this medicine in children. This medicine is not approved for use in children. Overdosage: If you think you have taken too much of this medicine contact a poison control center or emergency room at once. NOTE: This medicine is only for you. Do not share this medicine with others. What if I miss a dose? It is important not to miss a dose. Call your doctor or health care professional if you are unable to keep an appointment. What  may interact with this medicine?  cisplatin  live virus vaccines This list may not describe all possible interactions. Give your health care provider a list of all the medicines, herbs, non-prescription drugs, or dietary supplements you use. Also tell them if you smoke, drink alcohol, or use illegal drugs. Some items may interact with your medicine. What should I watch for while using this medicine? Your condition will be monitored carefully while you are receiving this medicine. You may need blood work done while you are taking this medicine. This medicine can cause serious allergic reactions. To reduce your risk you may need to take medicine before treatment with this medicine. Take your medicine as directed. In some patients, this medicine may cause a serious brain infection that may cause death. If you have any problems seeing, thinking, speaking, walking, or standing, tell your healthcare professional right away. If you cannot reach your healthcare professional, urgently seek other source of medical care. Call your doctor or health care professional for advice if you get a fever, chills or sore throat, or other symptoms of a cold or flu. Do not treat yourself. This drug decreases your body's ability to fight infections. Try to avoid being around people who are sick. Do not become pregnant while taking this medicine or for at least 12 months after stopping it. Women should inform their doctor if they wish to become pregnant or think they might be pregnant. There is a potential for serious side effects to an unborn child. Talk to your health care professional or pharmacist for more information. Do not breast-feed an infant while taking this medicine or for at   least 6 months after stopping it. What side effects may I notice from receiving this medicine? Side effects that you should report to your doctor or health care professional as soon as possible:  allergic reactions like skin rash, itching or  hives; swelling of the face, lips, or tongue  breathing problems  chest pain  changes in vision  diarrhea  headache with fever, neck stiffness, sensitivity to light, nausea, or confusion  fast, irregular heartbeat  loss of memory  low blood counts - this medicine may decrease the number of white blood cells, red blood cells and platelets. You may be at increased risk for infections and bleeding.  mouth sores  problems with balance, talking, or walking  redness, blistering, peeling or loosening of the skin, including inside the mouth  signs of infection - fever or chills, cough, sore throat, pain or difficulty passing urine  signs and symptoms of kidney injury like trouble passing urine or change in the amount of urine  signs and symptoms of liver injury like dark yellow or brown urine; general ill feeling or flu-like symptoms; light-colored stools; loss of appetite; nausea; right upper belly pain; unusually weak or tired; yellowing of the eyes or skin  signs and symptoms of low blood pressure like dizziness; feeling faint or lightheaded, falls; unusually weak or tired  stomach pain  swelling of the ankles, feet, hands  unusual bleeding or bruising  vomiting Side effects that usually do not require medical attention (report to your doctor or health care professional if they continue or are bothersome):  headache  joint pain  muscle cramps or muscle pain  nausea  tiredness This list may not describe all possible side effects. Call your doctor for medical advice about side effects. You may report side effects to FDA at 1-800-FDA-1088. Where should I keep my medicine? This drug is given in a hospital or clinic and will not be stored at home. NOTE: This sheet is a summary. It may not cover all possible information. If you have questions about this medicine, talk to your doctor, pharmacist, or health care provider.  2020 Elsevier/Gold Standard (2018-03-31  22:01:36)  

## 2019-03-15 ENCOUNTER — Encounter (HOSPITAL_COMMUNITY): Payer: Federal, State, Local not specified - PPO

## 2019-03-15 DIAGNOSIS — I129 Hypertensive chronic kidney disease with stage 1 through stage 4 chronic kidney disease, or unspecified chronic kidney disease: Secondary | ICD-10-CM | POA: Diagnosis not present

## 2019-03-15 DIAGNOSIS — D631 Anemia in chronic kidney disease: Secondary | ICD-10-CM | POA: Diagnosis not present

## 2019-03-15 DIAGNOSIS — N189 Chronic kidney disease, unspecified: Secondary | ICD-10-CM | POA: Diagnosis not present

## 2019-03-15 DIAGNOSIS — N052 Unspecified nephritic syndrome with diffuse membranous glomerulonephritis: Secondary | ICD-10-CM | POA: Diagnosis not present

## 2019-03-15 DIAGNOSIS — N1832 Chronic kidney disease, stage 3b: Secondary | ICD-10-CM | POA: Diagnosis not present

## 2019-03-22 ENCOUNTER — Ambulatory Visit (HOSPITAL_COMMUNITY)
Admission: RE | Admit: 2019-03-22 | Discharge: 2019-03-22 | Disposition: A | Discharge status: Home / Self Care | Payer: Federal, State, Local not specified - PPO | Source: Ambulatory Visit | Attending: Nephrology | Admitting: Nephrology

## 2019-03-22 ENCOUNTER — Other Ambulatory Visit: Payer: Self-pay

## 2019-03-22 DIAGNOSIS — N052 Unspecified nephritic syndrome with diffuse membranous glomerulonephritis: Secondary | ICD-10-CM | POA: Diagnosis not present

## 2019-03-22 MED ORDER — ACETAMINOPHEN 325 MG PO TABS
650.0000 mg | ORAL_TABLET | ORAL | Status: DC
  Administered 2019-03-22: 650 mg via ORAL

## 2019-03-22 MED ORDER — METHYLPREDNISOLONE SODIUM SUCC 125 MG IJ SOLR
INTRAMUSCULAR | Status: AC
  Filled 2019-03-22: qty 2

## 2019-03-22 MED ORDER — DIPHENHYDRAMINE HCL 50 MG/ML IJ SOLN
INTRAMUSCULAR | Status: AC
  Filled 2019-03-22: qty 1

## 2019-03-22 MED ORDER — METHYLPREDNISOLONE SODIUM SUCC 125 MG IJ SOLR
125.0000 mg | INTRAMUSCULAR | Status: DC
  Administered 2019-03-22: 125 mg via INTRAVENOUS

## 2019-03-22 MED ORDER — ACETAMINOPHEN 325 MG PO TABS
ORAL_TABLET | ORAL | Status: AC
  Filled 2019-03-22: qty 2

## 2019-03-22 MED ORDER — SODIUM CHLORIDE 0.9 % IV SOLN
1000.0000 mg | INTRAVENOUS | Status: DC
  Administered 2019-03-22: 1000 mg via INTRAVENOUS
  Filled 2019-03-22 (×2): qty 100

## 2019-03-22 MED ORDER — DIPHENHYDRAMINE HCL 50 MG/ML IJ SOLN
25.0000 mg | INTRAMUSCULAR | Status: DC
  Administered 2019-03-22: 25 mg via INTRAVENOUS

## 2019-03-26 ENCOUNTER — Other Ambulatory Visit: Payer: Self-pay | Admitting: Registered Nurse

## 2019-03-26 DIAGNOSIS — I10 Essential (primary) hypertension: Secondary | ICD-10-CM

## 2019-03-26 NOTE — Telephone Encounter (Signed)
Your patient 

## 2019-04-14 DIAGNOSIS — N1832 Chronic kidney disease, stage 3b: Secondary | ICD-10-CM | POA: Diagnosis not present

## 2019-04-19 ENCOUNTER — Ambulatory Visit: Payer: Federal, State, Local not specified - PPO | Admitting: Cardiovascular Disease

## 2019-04-19 DIAGNOSIS — I129 Hypertensive chronic kidney disease with stage 1 through stage 4 chronic kidney disease, or unspecified chronic kidney disease: Secondary | ICD-10-CM | POA: Diagnosis not present

## 2019-04-19 DIAGNOSIS — N2581 Secondary hyperparathyroidism of renal origin: Secondary | ICD-10-CM | POA: Diagnosis not present

## 2019-04-19 DIAGNOSIS — D631 Anemia in chronic kidney disease: Secondary | ICD-10-CM | POA: Diagnosis not present

## 2019-04-19 DIAGNOSIS — N184 Chronic kidney disease, stage 4 (severe): Secondary | ICD-10-CM | POA: Diagnosis not present

## 2019-05-03 ENCOUNTER — Encounter: Payer: Self-pay | Admitting: Cardiovascular Disease

## 2019-05-03 ENCOUNTER — Ambulatory Visit: Payer: Federal, State, Local not specified - PPO | Admitting: Cardiovascular Disease

## 2019-05-03 ENCOUNTER — Other Ambulatory Visit: Payer: Self-pay

## 2019-05-03 DIAGNOSIS — E7800 Pure hypercholesterolemia, unspecified: Secondary | ICD-10-CM | POA: Diagnosis not present

## 2019-05-03 DIAGNOSIS — I10 Essential (primary) hypertension: Secondary | ICD-10-CM | POA: Diagnosis not present

## 2019-05-03 DIAGNOSIS — G4733 Obstructive sleep apnea (adult) (pediatric): Secondary | ICD-10-CM | POA: Diagnosis not present

## 2019-05-03 LAB — HEPATIC FUNCTION PANEL
ALT: 25 IU/L (ref 0–44)
AST: 21 IU/L (ref 0–40)
Albumin: 3 g/dL — ABNORMAL LOW (ref 4.0–5.0)
Alkaline Phosphatase: 68 IU/L (ref 39–117)
Bilirubin Total: 0.3 mg/dL (ref 0.0–1.2)
Bilirubin, Direct: 0.08 mg/dL (ref 0.00–0.40)
Total Protein: 5.1 g/dL — ABNORMAL LOW (ref 6.0–8.5)

## 2019-05-03 LAB — LIPID PANEL
Chol/HDL Ratio: 3.5 ratio (ref 0.0–5.0)
Cholesterol, Total: 182 mg/dL (ref 100–199)
HDL: 52 mg/dL (ref >39)
LDL Chol Calc (NIH): 110 mg/dL — ABNORMAL HIGH (ref 0–99)
Triglycerides: 109 mg/dL (ref 0–149)
VLDL Cholesterol Cal: 20 mg/dL (ref 5–40)

## 2019-05-03 MED ORDER — AMLODIPINE BESYLATE 5 MG PO TABS: 5.0000 mg | ORAL_TABLET | Freq: Every day | ORAL | 3 refills | Status: DC

## 2019-05-03 MED ORDER — HYDRALAZINE HCL 50 MG PO TABS: 50.0000 mg | ORAL_TABLET | Freq: Three times a day (TID) | ORAL | 3 refills | Status: DC

## 2019-05-03 NOTE — Assessment & Plan Note (Signed)
History of difficult to control hypertension on multiple antihypertensive medications.  His renal Doppler study did not suggest that he had renal vascular hypertension.  He does avoid salt.  Blood pressure today is 160/120.  He is on amlodipine, labetalol and hydralazine.  He has chronic renal insufficiency therefore precluding him from being on an ACE/ARB.  I am going to increase his amlodipine from 2.5 mg to 5 mg a day and his hydralazine from 25 mg 3 times daily to 50 mg p.o. 3 times daily.  I am going to refer him to Dr. Blenda Mounts hypertension clinic for further evaluation and treatment.

## 2019-05-03 NOTE — Assessment & Plan Note (Signed)
History of hyperlipidemia previously on atorvastatin 40 mg a day subsequently increased to 80 mg a day at his last office visit.  We will recheck a lipid liver profile today.

## 2019-05-03 NOTE — Assessment & Plan Note (Signed)
Symptoms compatible with obstructive sleep apnea including daytime somnolence, nocturnal snoring and body habitus.  We will arrange for outpatient sleep study to see if he indeed has this and can be treated with CPAP which may also improve blood pressure control.

## 2019-05-03 NOTE — Patient Instructions (Signed)
Medication Instructions:  Increase Amlodipine to 5mg  Daily Increase Hydralazine to 50mg  Three times daily   If you need a refill on your cardiac medications before your next appointment, please call your pharmacy.   Lab work: Lipid and Hepatic Function If you have labs (blood work) drawn today and your tests are completely normal, you will receive your results only by: MyChart Message (if you have MyChart) OR A paper copy in the mail If you have any lab test that is abnormal or we need to change your treatment, we will call you to review the results.  Testing/Procedures: Your physician has recommended that you have a sleep study. This test records several body functions during sleep, including: brain activity, eye movement, oxygen and carbon dioxide blood levels, heart rate and rhythm, breathing rate and rhythm, the flow of air through your mouth and nose, snoring, body muscle movements, and chest and belly movement.  Follow-Up: At [facility], you and your health needs are our priority.  As part of our continuing mission to provide you with exceptional heart care, we have created designated Provider Care Teams.  These Care Teams include your primary Cardiologist (physician) and Advanced Practice Providers (APPs -  Physician Assistants and Nurse Practitioners) who all work together to provide you with the care you need, when you need it. You may see Dr. Gwenlyn Found or one of the following Advanced Practice Providers on your designated Care Team:    Kerin Ransom, PA-C  [place], [place]  Coletta Memos, [place]  Your physician wants you to follow-up in: 6 months with Dr. Gwenlyn Found. You will receive a reminder letter in the mail two months in advance. If you don't receive a letter, please call our office to schedule the follow-up appointment.  Any Other Special Instructions Will Be Listed Below (If Applicable). You have been referred to Dr. Blenda Mounts Hypertension clinic. Please make an appointment  to see her.

## 2019-05-03 NOTE — Progress Notes (Signed)
05/03/2019 OAKLEY KOSSMAN   10/06/1970  [id number]  Primary Physician Maximiano Coss, NP Primary Cardiologist: Lorretta Harp MD Lupe Carney, [place]  HPI:  Hector Brooks is a 49 y.o.  morbidly overweight married African-American male father of 5 children, grandfather one grandchild who works as a Therapist, sports carrier and was referred by Maximiano Coss NP for evaluation treatment of resistant hypertension.  I last saw him in the office 01/25/2019. He has a history of resistant hypertension, hyperlipidemia and morbid obesity.  He also has moderate renal insufficiency with a creatinine in the mid 2 range.  He does see a nephrologist.  He has no other cardiac risk factors.  There is no family history for heart disease.  Never had a heart attack or stroke.  He denies chest pain or shortness of breath.  He does have symptoms of obstructive sleep apnea with nocturnal snoring and daytime somnolence as well as a body habitus consistent with this.  Since I saw him last he did have renal Doppler studies that did not show evidence of renal artery stenosis.  2D echo showed normal LV systolic function with grade 2 diastolic dysfunction and moderately increased left ventricular hypertrophy.  He does check his blood pressure at home and is consistently elevated despite being on multiple medications.  He is otherwise asymptomatic.   Current Meds  Medication Sig  . albuterol (VENTOLIN HFA) 108 (90 Base) MCG/ACT inhaler Inhale 2 puffs into the lungs every 4 (four) hours as needed for wheezing or shortness of breath.  Marland Kitchen amLODipine (NORVASC) 5 MG tablet Take 0.5 tablets (2.5 mg total) by mouth daily.  Marland Kitchen atorvastatin (LIPITOR) 80 MG tablet Take 0.5 tablets (40 mg total) by mouth daily.  . FEROSUL 325 (65 Fe) MG tablet Take 325 mg by mouth daily.  . furosemide (LASIX) 40 MG tablet Take 1 tablet (40 mg total) by mouth daily.  . hydrALAZINE (APRESOLINE) 25 MG tablet Take 25 mg by mouth 3 (three) times  daily.  Marland Kitchen labetalol (NORMODYNE) 300 MG tablet TAKE 1 TABLET(200 MG) BY MOUTH TWICE DAILY (Patient taking differently: Take 300 mg by mouth 2 (two) times daily. )  . Multiple Vitamin (MULTIVITAMIN) tablet Take 1 tablet by mouth daily.  . [DISCONTINUED] labetalol (NORMODYNE) 200 MG tablet TAKE 1 TABLET BY MOUTH TWICE DAILY  . [DISCONTINUED] lisinopril (ZESTRIL) 10 MG tablet Take 1 tablet (10 mg total) by mouth daily.  . [DISCONTINUED] potassium chloride SA (K-DUR,KLOR-CON) 20 MEQ tablet TAKE 1 TABLET(20 MEQ) BY MOUTH DAILY     No Known Allergies  Social History   Socioeconomic History  . Marital status: Married    Spouse name: Not on file  . Number of children: Not on file  . Years of education: Not on file  . Highest education level: Not on file  Occupational History  . Not on file  Tobacco Use  . Smoking status: Never Smoker  . Smokeless tobacco: Never Used  Substance and Sexual Activity  . Alcohol use: No    Alcohol/week: 0.0 standard drinks  . Drug use: No  . Sexual activity: Yes  Other Topics Concern  . Not on file  Social History Narrative  . Not on file   Social Determinants of Health   Financial Resource Strain:   . Difficulty of Paying Living Expenses: Not on file  Food Insecurity:   . Worried About Charity fundraiser in the Last Year: Not on file  . Ran Out of  Food in the Last Year: Not on file  Transportation Needs:   . Lack of Transportation (Medical): Not on file  . Lack of Transportation (Non-Medical): Not on file  Physical Activity:   . Days of Exercise per Week: Not on file  . Minutes of Exercise per Session: Not on file  Stress:   . Feeling of Stress : Not on file  Social Connections:   . Frequency of Communication with Friends and Family: Not on file  . Frequency of Social Gatherings with Friends and Family: Not on file  . Attends Religious Services: Not on file  . Active Member of Clubs or Organizations: Not on file  . Attends Theatre manager Meetings: Not on file  . Marital Status: Not on file  Intimate Partner Violence:   . Fear of Current or Ex-Partner: Not on file  . Emotionally Abused: Not on file  . Physically Abused: Not on file  . Sexually Abused: Not on file     Review of Systems: General: negative for chills, fever, night sweats or weight changes.  Cardiovascular: negative for chest pain, dyspnea on exertion, edema, orthopnea, palpitations, paroxysmal nocturnal dyspnea or shortness of breath Dermatological: negative for rash Respiratory: negative for cough or wheezing Urologic: negative for hematuria Abdominal: negative for nausea, vomiting, diarrhea, bright red blood per rectum, melena, or hematemesis Neurologic: negative for visual changes, syncope, or dizziness All other systems reviewed and are otherwise negative except as noted above.    Blood pressure (!) 160/120, pulse 88, temperature 98.2 F (36.8 C), height 5\' 10"  (1.778 m), weight (!) 345 lb (156.5 kg).  General appearance: alert and no distress Neck: no adenopathy, no carotid bruit, no JVD, supple, symmetrical, trachea midline and thyroid not enlarged, symmetric, no tenderness/mass/nodules Lungs: clear to auscultation bilaterally Heart: regular rate and rhythm, S1, S2 normal, no murmur, click, rub or gallop Extremities: extremities normal, atraumatic, no cyanosis or edema Pulses: 2+ and symmetric Skin: Skin color, texture, turgor normal. No rashes or lesions Neurologic: Alert and oriented X 3, normal strength and tone. Normal symmetric reflexes. Normal coordination and gait  EKG not performed today  ASSESSMENT AND PLAN:   HTN (hypertension) History of difficult to control hypertension on multiple antihypertensive medications.  His renal Doppler study did not suggest that he had renal vascular hypertension.  He does avoid salt.  Blood pressure today is 160/120.  He is on amlodipine, labetalol and hydralazine.  He has chronic renal  insufficiency therefore precluding him from being on an ACE/ARB.  I am going to increase his amlodipine from 2.5 mg to 5 mg a day and his hydralazine from 25 mg 3 times daily to 50 mg p.o. 3 times daily.  I am going to refer him to Dr. Blenda Mounts hypertension clinic for further evaluation and treatment.  Pure hypercholesterolemia History of hyperlipidemia previously on atorvastatin 40 mg a day subsequently increased to 80 mg a day at his last office visit.  We will recheck a lipid liver profile today.  Obstructive sleep apnea Symptoms compatible with obstructive sleep apnea including daytime somnolence, nocturnal snoring and body habitus.  We will arrange for outpatient sleep study to see if he indeed has this and can be treated with CPAP which may also improve blood pressure control.      Lorretta Harp MD FACP,FACC,FAHA, [facility] 05/03/2019 8:19 AM

## 2019-05-04 ENCOUNTER — Telehealth: Payer: Self-pay

## 2019-05-04 NOTE — Telephone Encounter (Signed)
LM2CB for lab results

## 2019-05-04 NOTE — Telephone Encounter (Signed)
-----   Message from Lorretta Harp, MD sent at 05/04/2019  7:02 AM EST ----- FLP not at goal for primary prevention on Atorva 80. Add Zetia 10 and re check FLP 2 months. Send heart healthy diet as well

## 2019-05-06 ENCOUNTER — Telehealth: Payer: Self-pay | Admitting: *Deleted

## 2019-05-06 ENCOUNTER — Telehealth: Payer: Self-pay

## 2019-05-06 NOTE — Telephone Encounter (Signed)
-----   Message from Lorretta Harp, MD sent at 05/04/2019  7:02 AM EST ----- FLP not at goal for primary prevention on Atorva 80. Add Zetia 10 and re check FLP 2 months. Send heart healthy diet as well

## 2019-05-06 NOTE — Telephone Encounter (Signed)
LVM for pt to call back regarding lab results 

## 2019-05-06 NOTE — Telephone Encounter (Signed)
PA for split night sleep study faxed to [facility].

## 2019-05-10 DIAGNOSIS — E785 Hyperlipidemia, unspecified: Secondary | ICD-10-CM

## 2019-05-10 MED ORDER — EZETIMIBE 10 MG PO TABS: 10.0000 mg | ORAL_TABLET | Freq: Every day | ORAL | 3 refills | Status: DC

## 2019-05-16 ENCOUNTER — Telehealth: Payer: Self-pay | Admitting: *Deleted

## 2019-05-16 NOTE — Telephone Encounter (Signed)
Left message to return a call to discuss sleep study appointment details. 

## 2019-06-01 ENCOUNTER — Telehealth: Payer: Self-pay | Admitting: *Deleted

## 2019-06-01 NOTE — Telephone Encounter (Signed)
Left message to return a call to discuss sleep study appointment details. 

## 2019-06-04 ENCOUNTER — Inpatient Hospital Stay (HOSPITAL_COMMUNITY): Admission: RE | Admit: 2019-06-04 | Payer: Federal, State, Local not specified - PPO | Source: Ambulatory Visit

## 2019-06-07 ENCOUNTER — Encounter (HOSPITAL_BASED_OUTPATIENT_CLINIC_OR_DEPARTMENT_OTHER): Payer: Federal, State, Local not specified - PPO | Admitting: Cardiovascular Disease

## 2019-07-01 DIAGNOSIS — N2581 Secondary hyperparathyroidism of renal origin: Secondary | ICD-10-CM | POA: Diagnosis not present

## 2019-07-01 DIAGNOSIS — N189 Chronic kidney disease, unspecified: Secondary | ICD-10-CM | POA: Diagnosis not present

## 2019-07-01 DIAGNOSIS — D631 Anemia in chronic kidney disease: Secondary | ICD-10-CM | POA: Diagnosis not present

## 2019-07-01 DIAGNOSIS — N052 Unspecified nephritic syndrome with diffuse membranous glomerulonephritis: Secondary | ICD-10-CM | POA: Diagnosis not present

## 2019-07-01 DIAGNOSIS — I129 Hypertensive chronic kidney disease with stage 1 through stage 4 chronic kidney disease, or unspecified chronic kidney disease: Secondary | ICD-10-CM | POA: Diagnosis not present

## 2019-07-01 DIAGNOSIS — N184 Chronic kidney disease, stage 4 (severe): Secondary | ICD-10-CM | POA: Diagnosis not present

## 2019-07-02 ENCOUNTER — Other Ambulatory Visit (HOSPITAL_COMMUNITY)
Admission: RE | Admit: 2019-07-02 | Discharge: 2019-07-02 | Disposition: A | Discharge status: Home / Self Care | Payer: Federal, State, Local not specified - PPO | Source: Ambulatory Visit | Attending: Cardiovascular Disease | Admitting: Cardiovascular Disease

## 2019-07-02 DIAGNOSIS — Z01812 Encounter for preprocedural laboratory examination: Secondary | ICD-10-CM | POA: Diagnosis not present

## 2019-07-02 DIAGNOSIS — Z20822 Contact with and (suspected) exposure to covid-19: Secondary | ICD-10-CM | POA: Diagnosis not present

## 2019-07-02 LAB — SARS CORONAVIRUS 2 (TAT 6-24 HRS): SARS Coronavirus 2: NEGATIVE

## 2019-07-04 ENCOUNTER — Ambulatory Visit (HOSPITAL_BASED_OUTPATIENT_CLINIC_OR_DEPARTMENT_OTHER): Payer: Federal, State, Local not specified - PPO | Attending: Cardiovascular Disease | Admitting: Cardiovascular Disease

## 2019-07-04 ENCOUNTER — Other Ambulatory Visit: Payer: Self-pay

## 2019-07-04 DIAGNOSIS — G4733 Obstructive sleep apnea (adult) (pediatric): Secondary | ICD-10-CM | POA: Diagnosis not present

## 2019-07-04 DIAGNOSIS — Z6841 Body Mass Index (BMI) 40.0 and over, adult: Secondary | ICD-10-CM | POA: Diagnosis not present

## 2019-07-04 DIAGNOSIS — R0902 Hypoxemia: Secondary | ICD-10-CM | POA: Insufficient documentation

## 2019-07-04 DIAGNOSIS — R0683 Snoring: Secondary | ICD-10-CM | POA: Diagnosis not present

## 2019-07-04 DIAGNOSIS — R400 Somnolence: Secondary | ICD-10-CM

## 2019-07-04 DIAGNOSIS — Z79899 Other long term (current) drug therapy: Secondary | ICD-10-CM | POA: Diagnosis not present

## 2019-07-04 DIAGNOSIS — G4731 Primary central sleep apnea: Secondary | ICD-10-CM | POA: Diagnosis not present

## 2019-07-04 DIAGNOSIS — I10 Essential (primary) hypertension: Secondary | ICD-10-CM

## 2019-07-06 DIAGNOSIS — M1711 Unilateral primary osteoarthritis, right knee: Secondary | ICD-10-CM | POA: Diagnosis not present

## 2019-07-12 ENCOUNTER — Encounter (HOSPITAL_BASED_OUTPATIENT_CLINIC_OR_DEPARTMENT_OTHER): Payer: Self-pay | Admitting: Cardiovascular Disease

## 2019-07-12 NOTE — Procedures (Signed)
Patient Name: Hector Brooks, Hector Brooks Date: 07/04/2019 Gender: Male D.O.B: 05-20-1970 Age (years): 49 Referring Provider: Lorretta Harp Height (inches): 22 Interpreting Physician: Shelva Majestic MD, ABSM Weight (lbs): 340 RPSGT: Laren Everts BMI: 50 MRN: [id number] Neck Size: 20.50  CLINICAL INFORMATION Sleep Study Type: NPSG  Indication for sleep study: Fatigue, Hypertension, Obesity, OSA, Snoring, Witnessed Apneas  Epworth Sleepiness Score: 12  SLEEP STUDY TECHNIQUE As per the AASM Manual for the Scoring of Sleep and Associated Events v2.3 (April 2016) with a hypopnea requiring 4% desaturations.  The channels recorded and monitored were frontal, central and occipital EEG, electrooculogram (EOG), submentalis EMG (chin), nasal and oral airflow, thoracic and abdominal wall motion, anterior tibialis EMG, snore microphone, electrocardiogram, and pulse oximetry.  MEDICATIONS albuterol (VENTOLIN HFA) 108 (90 Base) MCG/ACT inhaler  amLODipine (NORVASC) 5 MG tablet  atorvastatin (LIPITOR) 80 MG tablet  ezetimibe (ZETIA) 10 MG tablet  FEROSUL 325 (65 Fe) MG tablet  furosemide (LASIX) 40 MG tablet  hydrALAZINE (APRESOLINE) 50 MG tablet  labetalol (NORMODYNE) 300 MG tablet  Multiple Vitamin (MULTIVITAMIN) tablet   Medications self-administered by patient taken the night of the study : N/A  SLEEP ARCHITECTURE The study was initiated at 9:50:47 PM and ended at 4:06:21 AM.  Sleep onset time was 9.5 minutes and the sleep efficiency was 28.4%%. The total sleep time was 106.5 minutes.  Stage REM latency was N/A minutes.  The patient spent 54.9%% of the night in stage N1 sleep, 45.1%% in stage N2 sleep, 0.0%% in stage N3 and 0% in REM.  Alpha intrusion was absent.  Supine sleep was 33.33%.  RESPIRATORY PARAMETERS The overall apnea/hypopnea index (AHI) was 116.1 per hour.  The respiratory disturbance index (RDI) was 116.6/h. There were 201 total apneas, including 187  obstructive, 14 central and 0 mixed apneas. There were 5 hypopneas and 1 RERAs.  Absence of REM sleep.  AHI while supine was 116.6 per hour.  The mean oxygen saturation was 93.5%. The minimum SpO2 during sleep was 81.0%.  Loud snoring was noted during this study.  CARDIAC DATA The 2 lead EKG demonstrated sinus rhythm. The mean heart rate was 80.4 beats per minute. Other EKG findings include: PACs.  LEG MOVEMENT DATA The total PLMS were 0 with a resulting PLMS index of 0.0. Associated arousal with leg movement index was 0.0 .  IMPRESSIONS - Severe obstructive sleep apnea occurred during this study (AHI 116.1/h) with absence of REM sleep.  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 - Moderate oxygen desaturation to a nadir of 81.0%. - The patient snored with loud snoring volume. - Reduced sleep efficiency at only 28.4%. - PACs were noted during this study. - Clinically significant periodic limb movements did not occur during sleep. No significant associated arousals.  DIAGNOSIS - Obstructive Sleep Apnea (327.23 [G47.33 ICD-10]) - Central Sleep Apnea (327.27 [G47.37 ICD-10]) - Nocturnal Hypoxemia (327.26 [G47.36 ICD-10])  RECOMMENDATIONS - CPAP titration to determine optimal pressure required to alleviate sleep disordered breathing. BiPAP or ASV titration may be required to eliminate central sleep apnea. - Effort should be made to optimize nasal and oropharyngeal patency. - Avoid alcohol, sedatives and other CNS depressants that may worsen sleep apnea and disrupt normal sleep architecture. - Sleep hygiene should be reviewed to assess factors that may improve sleep quality. - Weight management (BMI 50) and regular exercise should be initiated or continued if appropriate.  [Electronically signed] 07/12/2019 09:23 AM  Shelva Majestic MD, [facility], ABSM  Diplomate, Tax adviser of Sleep Medicine   NPI: [id number] [place] PH: [id number]   FX: [phone] [place]

## 2019-07-26 ENCOUNTER — Other Ambulatory Visit (HOSPITAL_COMMUNITY): Payer: Self-pay

## 2019-07-26 NOTE — Discharge Instructions (Signed)
  Epoetin Alfa injection What is this medicine? EPOETIN ALFA (e POE e tin AL fa) helps your body make more red blood cells. This medicine is used to treat anemia caused by chronic kidney disease, cancer chemotherapy, or HIV-therapy. It may also be used before surgery if you have anemia. This medicine may be used for other purposes; ask your health care provider or pharmacist if you have questions. COMMON BRAND NAME(S): Epogen, Procrit, Retacrit What should I tell my health care provider before I take this medicine? They need to know if you have any of these conditions:  cancer  heart disease  high blood pressure  history of blood clots  history of stroke  low levels of folate, iron, or vitamin B12 in the blood  seizures  an unusual or allergic reaction to erythropoietin, albumin, benzyl alcohol, hamster proteins, other medicines, foods, dyes, or preservatives  pregnant or trying to get pregnant  breast-feeding How should I use this medicine? This medicine is for injection into a vein or under the skin. It is usually given by a health care professional in a hospital or clinic setting. If you get this medicine at home, you will be taught how to prepare and give this medicine. Use exactly as directed. Take your medicine at regular intervals. Do not take your medicine more often than directed. It is important that you put your used needles and syringes in a special sharps container. Do not put them in a trash can. If you do not have a sharps container, call your pharmacist or healthcare provider to get one. A special MedGuide will be given to you by the pharmacist with each prescription and refill. Be sure to read this information carefully each time. Talk to your pediatrician regarding the use of this medicine in children. While this drug may be prescribed for selected conditions, precautions do apply. Overdosage: If you think you have taken too much of this medicine contact a  poison control center or emergency room at once. NOTE: This medicine is only for you. Do not share this medicine with others. What if I miss a dose? If you miss a dose, take it as soon as you can. If it is almost time for your next dose, take only that dose. Do not take double or extra doses. What may interact with this medicine? Interactions have not been studied. This list may not describe all possible interactions. Give your health care provider a list of all the medicines, herbs, non-prescription drugs, or dietary supplements you use. Also tell them if you smoke, drink alcohol, or use illegal drugs. Some items may interact with your medicine. What should I watch for while using this medicine? Your condition will be monitored carefully while you are receiving this medicine. You may need blood work done while you are taking this medicine. This medicine may cause a decrease in vitamin B6. You should make sure that you get enough vitamin B6 while you are taking this medicine. Discuss the foods you eat and the vitamins you take with your health care professional. What side effects may I notice from receiving this medicine? Side effects that you should report to your doctor or health care professional as soon as possible:  allergic reactions like skin rash, itching or hives, swelling of the face, lips, or tongue  seizures  signs and symptoms of a blood clot such as breathing problems; changes in vision; chest pain; severe, sudden headache; pain, swelling, warmth in the leg; trouble speaking; sudden   numbness or weakness of the face, arm or leg  signs and symptoms of a stroke like changes in vision; confusion; trouble speaking or understanding; severe headaches; sudden numbness or weakness of the face, arm or leg; trouble walking; dizziness; loss of balance or coordination Side effects that usually do not require medical attention (report to your doctor or health care professional if they continue  or are bothersome):  chills  cough  dizziness  fever  headaches  joint pain  muscle cramps  muscle pain  nausea, vomiting  pain, redness, or irritation at site where injected This list may not describe all possible side effects. Call your doctor for medical advice about side effects. You may report side effects to FDA at 1-800-FDA-1088. Where should I keep my medicine? Keep out of the reach of children. Store in a refrigerator between 2 and 8 degrees C (36 and 46 degrees F). Do not freeze or shake. Throw away any unused portion if using a single-dose vial. Multi-dose vials can be kept in the refrigerator for up to 21 days after the initial dose. Throw away unused medicine. NOTE: This sheet is a summary. It may not cover all possible information. If you have questions about this medicine, talk to your doctor, pharmacist, or health care provider.  2020 Elsevier/Gold Standard (2016-09-26 08:35:19) Ferumoxytol injection What is this medicine? FERUMOXYTOL is an iron complex. Iron is used to make healthy red blood cells, which carry oxygen and nutrients throughout the body. This medicine is used to treat iron deficiency anemia. This medicine may be used for other purposes; ask your health care provider or pharmacist if you have questions. COMMON BRAND NAME(S): Feraheme What should I tell my health care provider before I take this medicine? They need to know if you have any of these conditions:  anemia not caused by low iron levels  high levels of iron in the blood  magnetic resonance imaging (MRI) test scheduled  an unusual or allergic reaction to iron, other medicines, foods, dyes, or preservatives  pregnant or trying to get pregnant  breast-feeding How should I use this medicine? This medicine is for injection into a vein. It is given by a health care professional in a hospital or clinic setting. Talk to your pediatrician regarding the use of this medicine in children.  Special care may be needed. Overdosage: If you think you have taken too much of this medicine contact a poison control center or emergency room at once. NOTE: This medicine is only for you. Do not share this medicine with others. What if I miss a dose? It is important not to miss your dose. Call your doctor or health care professional if you are unable to keep an appointment. What may interact with this medicine? This medicine may interact with the following medications:  other iron products This list may not describe all possible interactions. Give your health care provider a list of all the medicines, herbs, non-prescription drugs, or dietary supplements you use. Also tell them if you smoke, drink alcohol, or use illegal drugs. Some items may interact with your medicine. What should I watch for while using this medicine? Visit your doctor or healthcare professional regularly. Tell your doctor or healthcare professional if your symptoms do not start to get better or if they get worse. You may need blood work done while you are taking this medicine. You may need to follow a special diet. Talk to your doctor. Foods that contain iron include: whole grains/cereals, dried fruits,   beans, or peas, leafy green vegetables, and organ meats (liver, kidney). What side effects may I notice from receiving this medicine? Side effects that you should report to your doctor or health care professional as soon as possible:  allergic reactions like skin rash, itching or hives, swelling of the face, lips, or tongue  breathing problems  changes in blood pressure  feeling faint or lightheaded, falls  fever or chills  flushing, sweating, or hot feelings  swelling of the ankles or feet Side effects that usually do not require medical attention (report to your doctor or health care professional if they continue or are bothersome):  diarrhea  headache  nausea, vomiting  stomach pain This list may not  describe all possible side effects. Call your doctor for medical advice about side effects. You may report side effects to FDA at 1-800-FDA-1088. Where should I keep my medicine? This drug is given in a hospital or clinic and will not be stored at home. NOTE: This sheet is a summary. It may not cover all possible information. If you have questions about this medicine, talk to your doctor, pharmacist, or health care provider.  2020 Elsevier/Gold Standard (2016-04-07 20:21:10)  

## 2019-07-27 ENCOUNTER — Ambulatory Visit (HOSPITAL_COMMUNITY)
Admission: RE | Admit: 2019-07-27 | Discharge: 2019-07-27 | Disposition: A | Discharge status: Home / Self Care | Payer: Federal, State, Local not specified - PPO | Source: Ambulatory Visit | Attending: Nephrology | Admitting: Nephrology

## 2019-07-27 ENCOUNTER — Other Ambulatory Visit: Payer: Self-pay

## 2019-07-27 DIAGNOSIS — D631 Anemia in chronic kidney disease: Secondary | ICD-10-CM | POA: Insufficient documentation

## 2019-07-27 DIAGNOSIS — N183 Chronic kidney disease, stage 3 unspecified: Secondary | ICD-10-CM | POA: Diagnosis not present

## 2019-07-27 LAB — POCT HEMOGLOBIN-HEMACUE: Hemoglobin: 9.6 g/dL — ABNORMAL LOW (ref 13.0–17.0)

## 2019-07-27 MED ORDER — EPOETIN ALFA-EPBX 10000 UNIT/ML IJ SOLN
INTRAMUSCULAR | Status: AC
  Filled 2019-07-27: qty 2

## 2019-07-27 MED ORDER — SODIUM CHLORIDE 0.9 % IV SOLN
510.0000 mg | Freq: Once | INTRAVENOUS | Status: AC
  Administered 2019-07-27: 510 mg via INTRAVENOUS
  Filled 2019-07-27: qty 17

## 2019-07-27 MED ORDER — EPOETIN ALFA-EPBX 10000 UNIT/ML IJ SOLN
20000.0000 [IU] | Freq: Once | INTRAMUSCULAR | Status: AC
  Administered 2019-07-27: 20000 [IU] via SUBCUTANEOUS

## 2019-07-29 ENCOUNTER — Other Ambulatory Visit: Payer: Self-pay | Admitting: Registered Nurse

## 2019-07-29 DIAGNOSIS — I10 Essential (primary) hypertension: Secondary | ICD-10-CM

## 2019-08-08 DIAGNOSIS — N184 Chronic kidney disease, stage 4 (severe): Secondary | ICD-10-CM | POA: Diagnosis not present

## 2019-08-08 DIAGNOSIS — D631 Anemia in chronic kidney disease: Secondary | ICD-10-CM | POA: Diagnosis not present

## 2019-08-08 DIAGNOSIS — N2581 Secondary hyperparathyroidism of renal origin: Secondary | ICD-10-CM | POA: Diagnosis not present

## 2019-08-08 DIAGNOSIS — R809 Proteinuria, unspecified: Secondary | ICD-10-CM | POA: Diagnosis not present

## 2019-08-08 DIAGNOSIS — N189 Chronic kidney disease, unspecified: Secondary | ICD-10-CM | POA: Diagnosis not present

## 2019-08-08 DIAGNOSIS — I129 Hypertensive chronic kidney disease with stage 1 through stage 4 chronic kidney disease, or unspecified chronic kidney disease: Secondary | ICD-10-CM | POA: Diagnosis not present

## 2019-08-12 ENCOUNTER — Telehealth: Payer: Self-pay | Admitting: *Deleted

## 2019-08-12 ENCOUNTER — Encounter: Payer: Self-pay | Admitting: *Deleted

## 2019-08-12 NOTE — Telephone Encounter (Signed)
-----   Message from Troy Sine, MD sent at 07/12/2019  9:30 AM EDT ----- [name],  Please notify pt of very severe sleep apnea and schedule for expeditious CPAP titration study.

## 2019-08-12 NOTE — Telephone Encounter (Signed)
-----   Message from Troy Sine, MD sent at 07/12/2019  9:30 AM EDT ----- Hector Brooks,  Please notify pt of very severe sleep apnea and schedule for expeditious CPAP titration study.

## 2019-08-12 NOTE — Telephone Encounter (Signed)
Called results lmtcb. 

## 2019-08-12 NOTE — Telephone Encounter (Signed)
This encounter was created in error - please disregard.

## 2019-08-16 ENCOUNTER — Encounter: Payer: Self-pay | Admitting: Registered Nurse

## 2019-08-16 ENCOUNTER — Ambulatory Visit: Payer: Federal, State, Local not specified - PPO | Admitting: Registered Nurse

## 2019-08-16 ENCOUNTER — Other Ambulatory Visit: Payer: Self-pay

## 2019-08-16 VITALS — BP 111/78 | HR 93 | Temp 98.0°F | Ht 70.0 in | Wt 340.0 lb

## 2019-08-16 DIAGNOSIS — E785 Hyperlipidemia, unspecified: Secondary | ICD-10-CM

## 2019-08-16 DIAGNOSIS — N289 Disorder of kidney and ureter, unspecified: Secondary | ICD-10-CM | POA: Diagnosis not present

## 2019-08-16 DIAGNOSIS — M25561 Pain in right knee: Secondary | ICD-10-CM

## 2019-08-16 DIAGNOSIS — Z13228 Encounter for screening for other metabolic disorders: Secondary | ICD-10-CM | POA: Diagnosis not present

## 2019-08-16 DIAGNOSIS — Z1329 Encounter for screening for other suspected endocrine disorder: Secondary | ICD-10-CM | POA: Diagnosis not present

## 2019-08-16 DIAGNOSIS — Z130 Encounter for screening for diseases of the blood and blood-forming organs and certain disorders involving the immune mechanism: Secondary | ICD-10-CM | POA: Diagnosis not present

## 2019-08-16 DIAGNOSIS — I10 Essential (primary) hypertension: Secondary | ICD-10-CM | POA: Diagnosis not present

## 2019-08-16 MED ORDER — LABETALOL HCL 300 MG PO TABS: 300.0000 mg | ORAL_TABLET | Freq: Two times a day (BID) | ORAL | 3 refills | Status: DC

## 2019-08-16 MED ORDER — ATORVASTATIN CALCIUM 80 MG PO TABS: 40.0000 mg | ORAL_TABLET | Freq: Every day | ORAL | 3 refills | Status: DC

## 2019-08-16 MED ORDER — FUROSEMIDE 40 MG PO TABS: 40.0000 mg | ORAL_TABLET | Freq: Every day | ORAL | 1 refills | Status: DC

## 2019-08-16 MED ORDER — HYDRALAZINE HCL 50 MG PO TABS: 50.0000 mg | ORAL_TABLET | Freq: Three times a day (TID) | ORAL | 3 refills | Status: DC

## 2019-08-16 MED ORDER — AMLODIPINE BESYLATE 5 MG PO TABS: 5.0000 mg | ORAL_TABLET | Freq: Every day | ORAL | 3 refills | Status: DC

## 2019-08-16 NOTE — Progress Notes (Signed)
Established Patient Office Visit  Subjective:  Patient ID: Hector Brooks, male    DOB: Dec 12, 1970  Age: 49 y.o. MRN: [id number]  CC:  Chief Complaint  Patient presents with  . Follow-up    x45mo on medical conditions    HPI Hector STRICKLINGpresents for 6 mo follow up  Originally seen with hypertension with headaches. Unfortunately resistant to treatment - was sent to Dr. BGwenlyn Found cardiology, who titrated medications to a point of control  Pt presents today feeling well. BP wnl. No headaches, chest pain, shob, doe, visual changes, or claudication. Has had baseline swelling in ble, some due to history of injury, but also has diminished kidney function that is factoring into this. Controlled well with compression stockings.   Notes that he has been having knee pain that has worsened for a few months. Has received a steroid injection that provided some relief, but pain persists. Aching pain on medial aspect of R knee. No acute injury. No redness or swelling. No warmth. Mildly ttp. Hx of meniscal tear on this knee.   Otherwise he is without complaint, he is "in good spirits today".  Past Medical History:  Diagnosis Date  . Chronic kidney disease   . 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 MENISCUS REPAIR Right 12/14    Family History  Problem Relation Age of Onset  . Cancer Father   . Diabetes Father   . Hyperlipidemia Sister   . Diabetes Maternal Grandfather   . Heart disease Maternal Grandfather     Social History   Socioeconomic History  . Marital status: Married    Spouse name: Not on file  . Number of children: Not on file  . Years of education: Not on file  . Highest education level: Not on file  Occupational History  . Not on file  Tobacco Use  . Smoking status: Never Smoker  . Smokeless tobacco: Never Used  Substance and Sexual  Activity  . Alcohol use: No    Alcohol/week: 0.0 standard drinks  . Drug use: No  . Sexual activity: Yes  Other Topics Concern  . Not on file  Social History Narrative  . Not on file   Social Determinants of Health   Financial Resource Strain:   . Difficulty of Paying Living Expenses:   Food Insecurity:   . Worried About RCharity fundraiserin the Last Year:   . RArboriculturistin the Last Year:   Transportation Needs:   . LFilm/video editor(Medical):   .Marland KitchenLack of Transportation (Non-Medical):   Physical Activity:   . Days of Exercise per Week:   . Minutes of Exercise per Session:   Stress:   . Feeling of Stress :   Social Connections:   . Frequency of Communication with Friends and Family:   . Frequency of Social Gatherings with Friends and Family:   . Attends Religious Services:   . Active Member of Clubs or Organizations:   . Attends CArchivistMeetings:   .Marland KitchenMarital Status:   Intimate Partner Violence:   . Fear of Current or Ex-Partner:   . Emotionally Abused:   .Marland KitchenPhysically Abused:   . Sexually Abused:     Outpatient Medications Prior to Visit  Medication Sig Dispense Refill  . albuterol (VENTOLIN HFA) 108 (90 Base) MCG/ACT inhaler Inhale 2 puffs  into the lungs every 4 (four) hours as needed for wheezing or shortness of breath. 18 g 0  . FEROSUL 325 (65 Fe) MG tablet Take 325 mg by mouth daily.    . Multiple Vitamin (MULTIVITAMIN) tablet Take 1 tablet by mouth daily.    Marland Kitchen atorvastatin (LIPITOR) 80 MG tablet Take 0.5 tablets (40 mg total) by mouth daily. 90 tablet 3  . furosemide (LASIX) 40 MG tablet Take 1 tablet (40 mg total) by mouth daily. 90 tablet 1  . hydrALAZINE (APRESOLINE) 50 MG tablet Take 1 tablet (50 mg total) by mouth 3 (three) times daily. 270 tablet 3  . labetalol (NORMODYNE) 300 MG tablet TAKE 1 TABLET(200 MG) BY MOUTH TWICE DAILY (Patient taking differently: Take 300 mg by mouth 2 (two) times daily. ) 180 tablet 3  . amLODipine  (NORVASC) 5 MG tablet Take 1 tablet (5 mg total) by mouth daily. 90 tablet 3  . ezetimibe (ZETIA) 10 MG tablet Take 1 tablet (10 mg total) by mouth daily. 90 tablet 3  . lisinopril (ZESTRIL) 10 MG tablet TAKE 1 TABLET(10 MG) BY MOUTH DAILY 90 tablet 1   No facility-administered medications prior to visit.    No Known Allergies  ROS Review of Systems  Constitutional: Negative.   HENT: Negative.   Eyes: Negative.   Respiratory: Negative.   Cardiovascular: Negative.   Gastrointestinal: Negative.   Endocrine: Negative.   Genitourinary: Negative.   Musculoskeletal: Positive for arthralgias. Negative for back pain, gait problem, joint swelling, myalgias, neck pain and neck stiffness.  Skin: Negative.   Allergic/Immunologic: Negative.   Neurological: Negative.   Hematological: Negative.   Psychiatric/Behavioral: Negative.   All other systems reviewed and are negative.     Objective:    Physical Exam Vitals and nursing note reviewed.  Constitutional:      General: He is not in acute distress.    Appearance: Normal appearance. He is obese. He is not ill-appearing, toxic-appearing or diaphoretic.  Cardiovascular:     Rate and Rhythm: Normal rate and regular rhythm.     Pulses: Normal pulses.     Heart sounds: Normal heart sounds. No murmur heard.  No friction rub. No gallop.   Pulmonary:     Effort: Pulmonary effort is normal. No respiratory distress.     Breath sounds: Normal breath sounds. No stridor. No wheezing, rhonchi or rales.  Chest:     Chest wall: No tenderness.  Skin:    Capillary Refill: Capillary refill takes less than 2 seconds.  Neurological:     General: No focal deficit present.     Mental Status: He is alert and oriented to person, place, and time. Mental status is at baseline.     Cranial Nerves: No cranial nerve deficit.  Psychiatric:        Mood and Affect: Mood normal.        Behavior: Behavior normal.        Thought Content: Thought content normal.         Judgment: Judgment normal.     BP 111/78 (BP Location: Right Arm, Patient Position: Sitting, Cuff Size: Large)   Pulse 93   Temp 98 F (36.7 C) (Temporal)   Ht _0  (1.778 m)   Wt (!) 340 lb (154.2 kg)   SpO2 94%   BMI 48.78 kg/m  Wt Readings from Last 3 Encounters:  08/16/19 (!) 340 lb (154.2 kg)  07/04/19 (!) 340 lb (154.2 kg)  05/03/19 (!) 345 lb (156.5  kg)     Health Maintenance Due  Topic Date Due  . COVID-19 Vaccine (1) Never done    There are no preventive care reminders to display for this patient.  Lab Results  Component Value Date   TSH 3.700 01/04/2019   Lab Results  Component Value Date   WBC 5.0 01/18/2019   HGB 9.6 (L) 07/27/2019   HCT 31.6 (L) 01/18/2019   MCV 90 01/18/2019   PLT 198 01/18/2019   Lab Results  Component Value Date   NA 144 01/18/2019   K 4.8 01/18/2019   CO2 24 01/18/2019   GLUCOSE 142 (H) 01/18/2019   BUN 22 01/18/2019   CREATININE 2.46 (H) 01/18/2019   BILITOT 0.3 05/03/2019   ALKPHOS 68 05/03/2019   AST 21 05/03/2019   ALT 25 05/03/2019   PROT 5.1 (L) 05/03/2019   ALBUMIN 3.0 (L) 05/03/2019   CALCIUM 8.3 (L) 01/18/2019   Lab Results  Component Value Date   CHOL 182 05/03/2019   Lab Results  Component Value Date   HDL 52 05/03/2019   Lab Results  Component Value Date   LDLCALC 110 (H) 05/03/2019   Lab Results  Component Value Date   TRIG 109 05/03/2019   Lab Results  Component Value Date   CHOLHDL 3.5 05/03/2019   Lab Results  Component Value Date   HGBA1C 5.9 (H) 01/04/2019      Assessment & Plan:   Problem List Items Addressed This Visit      Cardiovascular and Mediastinum   HTN (hypertension) - Primary   Relevant Medications   amLODipine (NORVASC) 5 MG tablet   atorvastatin (LIPITOR) 80 MG tablet   furosemide (LASIX) 40 MG tablet   hydrALAZINE (APRESOLINE) 50 MG tablet   labetalol (NORMODYNE) 300 MG tablet   Other Relevant Orders   CBC   CMP14+EGFR   Thyroid Panel With TSH    Hemoglobin A1c   Lipid Panel    Other Visit Diagnoses    Kidney function abnormal       Relevant Orders   CMP14+EGFR   Screening for endocrine, metabolic and immunity disorder       Relevant Orders   CBC   CMP14+EGFR   Thyroid Panel With TSH   Hemoglobin A1c   Hyperlipidemia, unspecified hyperlipidemia type       Relevant Medications   amLODipine (NORVASC) 5 MG tablet   atorvastatin (LIPITOR) 80 MG tablet   furosemide (LASIX) 40 MG tablet   hydrALAZINE (APRESOLINE) 50 MG tablet   labetalol (NORMODYNE) 300 MG tablet   Other Relevant Orders   Lipid Panel      Meds ordered this encounter  Medications  . amLODipine (NORVASC) 5 MG tablet    Sig: Take 1 tablet (5 mg total) by mouth daily.    Dispense:  90 tablet    Refill:  3    D/c previous Rx    Order Specific Question:   Supervising Provider    Answer:   [place], Lilia Argue [[id number]]  . atorvastatin (LIPITOR) 80 MG tablet    Sig: Take 0.5 tablets (40 mg total) by mouth daily.    Dispense:  90 tablet    Refill:  3    Order Specific Question:   Supervising Provider    AnswerPamella Pert, Lilia Argue [[id number]]  . furosemide (LASIX) 40 MG tablet    Sig: Take 1 tablet (40 mg total) by mouth daily.    Dispense:  90 tablet  Refill:  1    Order Specific Question:   Supervising Provider    AnswerPamella Pert, Lilia Argue [[id number]]  . hydrALAZINE (APRESOLINE) 50 MG tablet    Sig: Take 1 tablet (50 mg total) by mouth 3 (three) times daily.    Dispense:  270 tablet    Refill:  3    Order Specific Question:   Supervising Provider    AnswerPamella Pert, Lilia Argue [[id number]]  . labetalol (NORMODYNE) 300 MG tablet    Sig: Take 1 tablet (300 mg total) by mouth 2 (two) times daily.    Dispense:  180 tablet    Refill:  3    Order Specific Question:   Supervising Provider    AnswerRutherford Guys [[id number]]    Follow-up: No follow-ups on file.   PLAN  Refill all meds for six mos  Refer to ortho for knee pain  Labs collected, will  follow up as warranted  Patient encouraged to call clinic with any questions, comments, or concerns.  Maximiano Coss, NP

## 2019-08-16 NOTE — Patient Instructions (Signed)
° ° ° °  If you have lab work done today you will be contacted with your lab results within the next 2 weeks.  If you have not heard from us then please contact us. The fastest way to get your results is to register for My Chart. ° ° °IF you received an x-ray today, you will receive an invoice from [facility]. Please contact [facility] at [phone] with questions or concerns regarding your invoice.  ° °IF you received labwork today, you will receive an invoice from [facility]. Please contact [facility] at [phone] with questions or concerns regarding your invoice.  ° °Our billing staff will not be able to assist you with questions regarding bills from these companies. ° °You will be contacted with the lab results as soon as they are available. The fastest way to get your results is to activate your My Chart account. Instructions are located on the last page of this paperwork. If you have not heard from us regarding the results in 2 weeks, please contact this office. °  ° ° ° °

## 2019-08-17 ENCOUNTER — Telehealth: Payer: Self-pay | Admitting: *Deleted

## 2019-08-17 ENCOUNTER — Other Ambulatory Visit (HOSPITAL_COMMUNITY): Payer: Self-pay | Admitting: *Deleted

## 2019-08-17 LAB — CMP14+EGFR
ALT: 18 IU/L (ref 0–44)
AST: 13 IU/L (ref 0–40)
Albumin/Globulin Ratio: 1.4 (ref 1.2–2.2)
Albumin: 3.1 g/dL — ABNORMAL LOW (ref 4.0–5.0)
Alkaline Phosphatase: 70 IU/L (ref 48–121)
BUN/Creatinine Ratio: 9 (ref 9–20)
BUN: 41 mg/dL — ABNORMAL HIGH (ref 6–24)
Bilirubin Total: <0.2 mg/dL (ref 0.0–1.2)
CO2: 19 mmol/L — ABNORMAL LOW (ref 20–29)
Calcium: 8.1 mg/dL — ABNORMAL LOW (ref 8.7–10.2)
Chloride: 114 mmol/L — ABNORMAL HIGH (ref 96–106)
Creatinine, Ser: 4.7 mg/dL (ref 0.76–1.27)
GFR calc Af Amer: 16 mL/min/{1.73_m2} — ABNORMAL LOW (ref >59)
GFR calc non Af Amer: 14 mL/min/{1.73_m2} — ABNORMAL LOW (ref >59)
Globulin, Total: 2.2 g/dL (ref 1.5–4.5)
Glucose: 104 mg/dL — ABNORMAL HIGH (ref 65–99)
Potassium: 5.2 mmol/L (ref 3.5–5.2)
Sodium: 144 mmol/L (ref 134–144)
Total Protein: 5.3 g/dL — ABNORMAL LOW (ref 6.0–8.5)

## 2019-08-17 LAB — LIPID PANEL
Chol/HDL Ratio: 3 ratio (ref 0.0–5.0)
Cholesterol, Total: 162 mg/dL (ref 100–199)
HDL: 54 mg/dL (ref >39)
LDL Chol Calc (NIH): 93 mg/dL (ref 0–99)
Triglycerides: 78 mg/dL (ref 0–149)
VLDL Cholesterol Cal: 15 mg/dL (ref 5–40)

## 2019-08-17 LAB — CBC
Hematocrit: 27.8 % — ABNORMAL LOW (ref 37.5–51.0)
Hemoglobin: 8.8 g/dL — ABNORMAL LOW (ref 13.0–17.7)
MCH: 29.2 pg (ref 26.6–33.0)
MCHC: 31.7 g/dL (ref 31.5–35.7)
MCV: 92 fL (ref 79–97)
Platelets: 226 10*3/uL (ref 150–450)
RBC: 3.01 x10E6/uL — ABNORMAL LOW (ref 4.14–5.80)
RDW: 13.6 % (ref 11.6–15.4)
WBC: 5.1 10*3/uL (ref 3.4–10.8)

## 2019-08-17 LAB — THYROID PANEL WITH TSH
Free Thyroxine Index: 1.3 (ref 1.2–4.9)
T3 Uptake Ratio: 25 % (ref 24–39)
T4, Total: 5 ug/dL (ref 4.5–12.0)
TSH: 3.28 u[IU]/mL (ref 0.450–4.500)

## 2019-08-17 LAB — HEMOGLOBIN A1C
Est. average glucose Bld gHb Est-mCnc: 108 mg/dL
Hgb A1c MFr Bld: 5.4 % (ref 4.8–5.6)

## 2019-08-17 NOTE — Telephone Encounter (Signed)
Contacted patient earlier today with lab results about abnormal kidney function, he will need a Nephrology referral.  After speaking to [place] a referral was made to [facility]. Patient will be contacted soon to setup appointment, per Rich NP. Patient understood.

## 2019-08-17 NOTE — Progress Notes (Signed)
Call please -   Mr Abdon has worsening kidney function. He needs to be seen by Nephrology as soon as possible. I am hesitant to adjust his bp meds at this time, as he has been so resistant to treatment in the past, but he should immediately stop taking any ibuprofen, advil, NSAIDs. He an use tylenol if needed.   It seems that he was seen by [facility] in the past.  Thank you  Kathrin Ruddy, NP

## 2019-08-18 ENCOUNTER — Telehealth: Payer: Self-pay | Admitting: Registered Nurse

## 2019-08-18 NOTE — Telephone Encounter (Signed)
Patient was informed and will schedule an appt to see specialist. Do you think the patient need a referral to go back to the specialist

## 2019-08-18 NOTE — Telephone Encounter (Signed)
'  Hector Brooks' with [facility] calling with critical value: Creatinine 4.70 Practice currently closed for lunch. Will CB to ensure encounter received.

## 2019-08-18 NOTE — Telephone Encounter (Signed)
My apologies - Pt had been seen by nephrology recently, they are aware. No further concerns at this time.  Thank you  Kathrin Ruddy, NP

## 2019-08-19 ENCOUNTER — Other Ambulatory Visit: Payer: Self-pay

## 2019-08-19 ENCOUNTER — Ambulatory Visit (HOSPITAL_COMMUNITY)
Admission: RE | Admit: 2019-08-19 | Discharge: 2019-08-19 | Disposition: A | Discharge status: Home / Self Care | Payer: Federal, State, Local not specified - PPO | Source: Ambulatory Visit | Attending: Nephrology | Admitting: Nephrology

## 2019-08-19 DIAGNOSIS — N052 Unspecified nephritic syndrome with diffuse membranous glomerulonephritis: Secondary | ICD-10-CM | POA: Diagnosis not present

## 2019-08-19 DIAGNOSIS — N183 Chronic kidney disease, stage 3 unspecified: Secondary | ICD-10-CM | POA: Diagnosis not present

## 2019-08-19 MED ORDER — METHYLPREDNISOLONE SODIUM SUCC 125 MG IJ SOLR
INTRAMUSCULAR | Status: AC
  Administered 2019-08-19: 125 mg via INTRAVENOUS
  Filled 2019-08-19: qty 2

## 2019-08-19 MED ORDER — DIPHENHYDRAMINE HCL 50 MG/ML IJ SOLN
INTRAMUSCULAR | Status: AC
  Filled 2019-08-19: qty 1

## 2019-08-19 MED ORDER — SODIUM CHLORIDE 0.9 % IV SOLN
1000.0000 mg | Freq: Once | INTRAVENOUS | Status: AC
  Administered 2019-08-19: 1000 mg via INTRAVENOUS
  Filled 2019-08-19: qty 100

## 2019-08-19 MED ORDER — METHYLPREDNISOLONE SODIUM SUCC 125 MG IJ SOLR: 125.0000 mg | Freq: Once | INTRAMUSCULAR | Status: AC

## 2019-08-19 MED ORDER — DIPHENHYDRAMINE HCL 50 MG/ML IJ SOLN
25.0000 mg | Freq: Once | INTRAMUSCULAR | Status: AC
  Administered 2019-08-19: 25 mg via INTRAVENOUS

## 2019-08-19 MED ORDER — ACETAMINOPHEN 325 MG PO TABS
ORAL_TABLET | ORAL | Status: AC
  Filled 2019-08-19: qty 2

## 2019-08-19 MED ORDER — ACETAMINOPHEN 325 MG PO TABS
650.0000 mg | ORAL_TABLET | Freq: Once | ORAL | Status: AC
  Administered 2019-08-19: 650 mg via ORAL

## 2019-08-22 ENCOUNTER — Telehealth: Payer: Self-pay | Admitting: *Deleted

## 2019-08-22 NOTE — Telephone Encounter (Signed)
-----   Message from Lauralee Evener, [place] sent at 08/09/2019  5:11 PM EDT ----- CPAP titration

## 2019-08-23 ENCOUNTER — Ambulatory Visit (INDEPENDENT_AMBULATORY_CARE_PROVIDER_SITE_OTHER): Payer: Federal, State, Local not specified - PPO | Admitting: Orthopaedic Surgery

## 2019-08-23 ENCOUNTER — Telehealth: Payer: Self-pay

## 2019-08-23 ENCOUNTER — Ambulatory Visit: Payer: Self-pay

## 2019-08-23 VITALS — Ht 70.0 in | Wt 330.0 lb

## 2019-08-23 DIAGNOSIS — M1711 Unilateral primary osteoarthritis, right knee: Secondary | ICD-10-CM

## 2019-08-23 DIAGNOSIS — Z6841 Body Mass Index (BMI) 40.0 and over, adult: Secondary | ICD-10-CM | POA: Insufficient documentation

## 2019-08-23 NOTE — Telephone Encounter (Signed)
Please submit for gel injection right knee-Dr. Erlinda Hong.

## 2019-08-23 NOTE — Telephone Encounter (Signed)
Noted  

## 2019-08-23 NOTE — Progress Notes (Signed)
Office Visit Note   Patient: Hector Brooks           Date of Birth: Jul 25, 1970           MRN: [id number] Visit Date: 08/23/2019              Requested by: Maximiano Coss, NP [place],   [id number] PCP: Maximiano Coss, NP   Assessment & Plan: Visit Diagnoses:  1. Primary osteoarthritis of right knee   2. Body mass index 45.0-49.9, adult ([place])   3. Morbid obesity ([place])     Plan: Impression is end-stage tricompartmental right knee DJD.  Unfortunately his DJD is severe and will need a knee replacement for meaningful relief but unfortunately given his increased BMI he is not a candidate.  I have recommended Voltaren gel, weight loss and referral to medical weight management.  We did not repeat cortisone injections today as these have not been effective recently.  Follow-up as needed.  Follow-Up Instructions: Return if symptoms worsen or fail to improve.   Orders:  Orders Placed This Encounter  Procedures  . XR KNEE 3 VIEW RIGHT  . Amb Ref to Medical Weight Management   No orders of the defined types were placed in this encounter.     Procedures: No procedures performed   Clinical Data: No additional findings.   Subjective: Chief Complaint  Patient presents with  . Right Knee - Pain    Hector Brooks is a 49 year old gentleman comes in for chronic right knee pain for many years.  He has had a right knee scope in the past.  The right knee flares up quite a bit and it wakes him up at night.  He has chronic pain.  He has had cortisone injections before without significant relief.  He has had a history of DVT currently not on anticoagulation.  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  Denies any mechanical symptoms.   Review of Systems  Constitutional: Negative.   All other systems reviewed and are negative.    Objective: Vital Signs: Ht 5\' 10"  (1.778 m)   Wt (!) 330 lb (149.7 kg)   BMI 47.35 kg/m   Physical  Exam Vitals and nursing note reviewed.  Constitutional:      Appearance: He is well-developed.  HENT:     Head: Normocephalic and atraumatic.  Eyes:     Pupils: Pupils are equal, round, and reactive to light.  Pulmonary:     Effort: Pulmonary effort is normal.  Abdominal:     Palpations: Abdomen is soft.  Musculoskeletal:        General: Normal range of motion.     Cervical back: Neck supple.  Skin:    General: Skin is warm.  Neurological:     Mental Status: He is alert and oriented to person, place, and time.  Psychiatric:        Behavior: Behavior normal.        Thought Content: Thought content normal.        Judgment: Judgment normal.     Ortho Exam Right knee shows no joint effusion.  He has 2+ pitting edema of the right lower extremity.  Painful range of motion with mild varus deformity.  Collaterals and cruciates are stable.  Range of motion is slightly restricted. Specialty Comments:  No specialty comments available.  Imaging: XR KNEE 3 VIEW RIGHT  Result Date: 08/23/2019 Severe tricompartmental DJD with varus deformity  PMFS History: Patient Active Problem List   Diagnosis Date Noted  . Primary osteoarthritis of right knee 08/23/2019  . Body mass index 45.0-49.9, adult ([place]) 08/23/2019  . Obstructive sleep apnea 01/25/2019  . 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 Generalized edema 07/04/2017  . Hypervolemia 07/04/2017  . History of DVT of lower extremity 07/04/2017  . Morbid obesity ([place]) 07/04/2017  . Anemia 07/04/2017  . Pure hypercholesterolemia 07/04/2017  . Prediabetes 07/04/2017  . HTN (hypertension) 05/21/2016  . DVT, femoral, chronic ([place]) 09/29/2014   Past Medical History:  Diagnosis Date  . Chronic kidney disease   . 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 Cancer Father   .  Diabetes Father   . Hyperlipidemia Sister   . Diabetes Maternal Grandfather   . Heart disease Maternal Grandfather     Past Surgical History:  Procedure Laterality Date  . MENISCUS REPAIR Right 12/14   Social History   Occupational History  . Not on file  Tobacco Use  . Smoking status: Never Smoker  . Smokeless tobacco: Never Used  Substance and Sexual Activity  . Alcohol use: No    Alcohol/week: 0.0 standard drinks  . Drug use: No  . Sexual activity: Yes

## 2019-08-26 ENCOUNTER — Other Ambulatory Visit: Payer: Self-pay

## 2019-08-26 ENCOUNTER — Ambulatory Visit (HOSPITAL_COMMUNITY)
Admission: RE | Admit: 2019-08-26 | Discharge: 2019-08-26 | Disposition: A | Discharge status: Home / Self Care | Payer: Federal, State, Local not specified - PPO | Source: Ambulatory Visit | Attending: Nephrology | Admitting: Nephrology

## 2019-08-26 DIAGNOSIS — N189 Chronic kidney disease, unspecified: Secondary | ICD-10-CM | POA: Diagnosis not present

## 2019-08-26 DIAGNOSIS — D631 Anemia in chronic kidney disease: Secondary | ICD-10-CM | POA: Diagnosis not present

## 2019-08-26 LAB — POCT HEMOGLOBIN-HEMACUE: Hemoglobin: 9 g/dL — ABNORMAL LOW (ref 13.0–17.0)

## 2019-08-26 MED ORDER — SODIUM CHLORIDE 0.9 % IV SOLN
510.0000 mg | Freq: Once | INTRAVENOUS | Status: AC
  Administered 2019-08-26: 510 mg via INTRAVENOUS
  Filled 2019-08-26: qty 510

## 2019-08-26 MED ORDER — EPOETIN ALFA-EPBX 10000 UNIT/ML IJ SOLN
20000.0000 [IU] | INTRAMUSCULAR | Status: DC
  Administered 2019-08-26: 20000 [IU] via SUBCUTANEOUS

## 2019-08-26 MED ORDER — EPOETIN ALFA-EPBX 10000 UNIT/ML IJ SOLN
INTRAMUSCULAR | Status: AC
  Filled 2019-08-26: qty 2

## 2019-08-26 NOTE — Discharge Instructions (Signed)
Ferumoxytol injection What is this medicine? FERUMOXYTOL is an iron complex. Iron is used to make healthy red blood cells, which carry oxygen and nutrients throughout the body. This medicine is used to treat iron deficiency anemia. This medicine may be used for other purposes; ask your health care provider or pharmacist if you have questions. COMMON BRAND NAME(S): Feraheme What should I tell my health care provider before I take this medicine? They need to know if you have any of these conditions:  anemia not caused by low iron levels  high levels of iron in the blood  magnetic resonance imaging (MRI) test scheduled  an unusual or allergic reaction to iron, other medicines, foods, dyes, or preservatives  pregnant or trying to get pregnant  breast-feeding How should I use this medicine? This medicine is for injection into a vein. It is given by a health care professional in a hospital or clinic setting. Talk to your pediatrician regarding the use of this medicine in children. Special care may be needed. Overdosage: If you think you have taken too much of this medicine contact a poison control center or emergency room at once. NOTE: This medicine is only for you. Do not share this medicine with others. What if I miss a dose? It is important not to miss your dose. Call your doctor or health care professional if you are unable to keep an appointment. What may interact with this medicine? This medicine may interact with the following medications:  other iron products This list may not describe all possible interactions. Give your health care provider a list of all the medicines, herbs, non-prescription drugs, or dietary supplements you use. Also tell them if you smoke, drink alcohol, or use illegal drugs. Some items may interact with your medicine. What should I watch for while using this medicine? Visit your doctor or healthcare professional regularly. Tell your doctor or healthcare  professional if your symptoms do not start to get better or if they get worse. You may need blood work done while you are taking this medicine. You may need to follow a special diet. Talk to your doctor. Foods that contain iron include: whole grains/cereals, dried fruits, beans, or peas, leafy green vegetables, and organ meats (liver, kidney). What side effects may I notice from receiving this medicine? Side effects that you should report to your doctor or health care professional as soon as possible:  allergic reactions like skin rash, itching or hives, swelling of the face, lips, or tongue  breathing problems  changes in blood pressure  feeling faint or lightheaded, falls  fever or chills  flushing, sweating, or hot feelings  swelling of the ankles or feet Side effects that usually do not require medical attention (report to your doctor or health care professional if they continue or are bothersome):  diarrhea  headache  nausea, vomiting  stomach pain This list may not describe all possible side effects. Call your doctor for medical advice about side effects. You may report side effects to FDA at 1-800-FDA-1088. Where should I keep my medicine? This drug is given in a hospital or clinic and will not be stored at home. NOTE: This sheet is a summary. It may not cover all possible information. If you have questions about this medicine, talk to your doctor, pharmacist, or health care provider.  2020 Elsevier/Gold Standard (2016-04-07 20:21:10) Epoetin Alfa injection What is this medicine? EPOETIN ALFA (e POE e tin AL fa) helps your body make more red blood cells. This   medicine is used to treat anemia caused by chronic kidney disease, cancer chemotherapy, or HIV-therapy. It may also be used before surgery if you have anemia. This medicine may be used for other purposes; ask your health care provider or pharmacist if you have questions. COMMON BRAND NAME(S): Epogen, Procrit,  Retacrit What should I tell my health care provider before I take this medicine? They need to know if you have any of these conditions:  cancer  heart disease  high blood pressure  history of blood clots  history of stroke  low levels of folate, iron, or vitamin B12 in the blood  seizures  an unusual or allergic reaction to erythropoietin, albumin, benzyl alcohol, hamster proteins, other medicines, foods, dyes, or preservatives  pregnant or trying to get pregnant  breast-feeding How should I use this medicine? This medicine is for injection into a vein or under the skin. It is usually given by a health care professional in a hospital or clinic setting. If you get this medicine at home, you will be taught how to prepare and give this medicine. Use exactly as directed. Take your medicine at regular intervals. Do not take your medicine more often than directed. It is important that you put your used needles and syringes in a special sharps container. Do not put them in a trash can. If you do not have a sharps container, call your pharmacist or healthcare provider to get one. A special MedGuide will be given to you by the pharmacist with each prescription and refill. Be sure to read this information carefully each time. Talk to your pediatrician regarding the use of this medicine in children. While this drug may be prescribed for selected conditions, precautions do apply. Overdosage: If you think you have taken too much of this medicine contact a poison control center or emergency room at once. NOTE: This medicine is only for you. Do not share this medicine with others. What if I miss a dose? If you miss a dose, take it as soon as you can. If it is almost time for your next dose, take only that dose. Do not take double or extra doses. What may interact with this medicine? Interactions have not been studied. This list may not describe all possible interactions. Give your health care  provider a list of all the medicines, herbs, non-prescription drugs, or dietary supplements you use. Also tell them if you smoke, drink alcohol, or use illegal drugs. Some items may interact with your medicine. What should I watch for while using this medicine? Your condition will be monitored carefully while you are receiving this medicine. You may need blood work done while you are taking this medicine. This medicine may cause a decrease in vitamin B6. You should make sure that you get enough vitamin B6 while you are taking this medicine. Discuss the foods you eat and the vitamins you take with your health care professional. What side effects may I notice from receiving this medicine? Side effects that you should report to your doctor or health care professional as soon as possible:  allergic reactions like skin rash, itching or hives, swelling of the face, lips, or tongue  seizures  signs and symptoms of a blood clot such as breathing problems; changes in vision; chest pain; severe, sudden headache; pain, swelling, warmth in the leg; trouble speaking; sudden numbness or weakness of the face, arm or leg  signs and symptoms of a stroke like changes in vision; confusion; trouble speaking or understanding;   severe headaches; sudden numbness or weakness of the face, arm or leg; trouble walking; dizziness; loss of balance or coordination Side effects that usually do not require medical attention (report to your doctor or health care professional if they continue or are bothersome):  chills  cough  dizziness  fever  headaches  joint pain  muscle cramps  muscle pain  nausea, vomiting  pain, redness, or irritation at site where injected This list may not describe all possible side effects. Call your doctor for medical advice about side effects. You may report side effects to FDA at 1-800-FDA-1088. Where should I keep my medicine? Keep out of the reach of children. Store in a  refrigerator between 2 and 8 degrees C (36 and 46 degrees F). Do not freeze or shake. Throw away any unused portion if using a single-dose vial. Multi-dose vials can be kept in the refrigerator for up to 21 days after the initial dose. Throw away unused medicine. NOTE: This sheet is a summary. It may not cover all possible information. If you have questions about this medicine, talk to your doctor, pharmacist, or health care provider.  2020 Elsevier/Gold Standard (2016-09-26 08:35:19)  

## 2019-09-02 ENCOUNTER — Inpatient Hospital Stay (HOSPITAL_COMMUNITY)
Admission: RE | Admit: 2019-09-02 | Discharge: 2019-09-02 | Disposition: A | Discharge status: Home / Self Care | Payer: Federal, State, Local not specified - PPO | Source: Ambulatory Visit | Attending: Nephrology | Admitting: Nephrology

## 2019-09-07 ENCOUNTER — Telehealth: Payer: Self-pay

## 2019-09-07 NOTE — Telephone Encounter (Signed)
Submitted VOB for SynviscOne, right knee. 

## 2019-09-09 ENCOUNTER — Ambulatory Visit: Payer: Federal, State, Local not specified - PPO | Admitting: Registered Nurse

## 2019-09-09 ENCOUNTER — Other Ambulatory Visit: Payer: Self-pay

## 2019-09-09 ENCOUNTER — Ambulatory Visit (HOSPITAL_COMMUNITY)
Admission: RE | Admit: 2019-09-09 | Discharge: 2019-09-09 | Disposition: A | Discharge status: Home / Self Care | Payer: Federal, State, Local not specified - PPO | Source: Ambulatory Visit | Attending: Nephrology | Admitting: Nephrology

## 2019-09-09 ENCOUNTER — Telehealth: Payer: Self-pay

## 2019-09-09 DIAGNOSIS — D631 Anemia in chronic kidney disease: Secondary | ICD-10-CM | POA: Insufficient documentation

## 2019-09-09 DIAGNOSIS — N183 Chronic kidney disease, stage 3 unspecified: Secondary | ICD-10-CM | POA: Diagnosis not present

## 2019-09-09 LAB — POCT HEMOGLOBIN-HEMACUE: Hemoglobin: 9.8 g/dL — ABNORMAL LOW (ref 13.0–17.0)

## 2019-09-09 MED ORDER — EPOETIN ALFA-EPBX 10000 UNIT/ML IJ SOLN
20000.0000 [IU] | INTRAMUSCULAR | Status: DC
  Administered 2019-09-09: 20000 [IU] via SUBCUTANEOUS

## 2019-09-09 MED ORDER — EPOETIN ALFA-EPBX 10000 UNIT/ML IJ SOLN
INTRAMUSCULAR | Status: AC
  Filled 2019-09-09: qty 2

## 2019-09-09 NOTE — Telephone Encounter (Signed)
Called and left a VM for patient to return call to schedule gel injection appt.with Dr. Erlinda Hong.  Approved, SynviscOne, right knee. [place] Patient will be responsible for 30% OOP. No Co-pay No PA required

## 2019-09-13 ENCOUNTER — Telehealth: Payer: Self-pay | Admitting: *Deleted

## 2019-09-13 ENCOUNTER — Ambulatory Visit: Payer: Federal, State, Local not specified - PPO | Admitting: Registered Nurse

## 2019-09-13 ENCOUNTER — Other Ambulatory Visit: Payer: Self-pay

## 2019-09-13 ENCOUNTER — Encounter: Payer: Self-pay | Admitting: Registered Nurse

## 2019-09-13 ENCOUNTER — Encounter (HOSPITAL_COMMUNITY): Payer: Federal, State, Local not specified - PPO

## 2019-09-13 VITALS — BP 111/56 | HR 93 | Temp 97.2°F | Resp 18 | Ht 70.0 in | Wt 327.6 lb

## 2019-09-13 DIAGNOSIS — R8290 Unspecified abnormal findings in urine: Secondary | ICD-10-CM | POA: Diagnosis not present

## 2019-09-13 DIAGNOSIS — G4733 Obstructive sleep apnea (adult) (pediatric): Secondary | ICD-10-CM

## 2019-09-13 DIAGNOSIS — R300 Dysuria: Secondary | ICD-10-CM

## 2019-09-13 LAB — COMPREHENSIVE METABOLIC PANEL
ALT: 18 IU/L (ref 0–44)
AST: 12 IU/L (ref 0–40)
Albumin/Globulin Ratio: 1.4 (ref 1.2–2.2)
Albumin: 2.9 g/dL — ABNORMAL LOW (ref 4.0–5.0)
Alkaline Phosphatase: 59 IU/L (ref 48–121)
BUN/Creatinine Ratio: 8 — ABNORMAL LOW (ref 9–20)
BUN: 45 mg/dL — ABNORMAL HIGH (ref 6–24)
Bilirubin Total: <0.2 mg/dL (ref 0.0–1.2)
CO2: 21 mmol/L (ref 20–29)
Calcium: 8.2 mg/dL — ABNORMAL LOW (ref 8.7–10.2)
Chloride: 113 mmol/L — ABNORMAL HIGH (ref 96–106)
Creatinine, Ser: 5.49 mg/dL (ref 0.76–1.27)
GFR calc Af Amer: 13 mL/min/{1.73_m2} — ABNORMAL LOW (ref >59)
GFR calc non Af Amer: 11 mL/min/{1.73_m2} — ABNORMAL LOW (ref >59)
Globulin, Total: 2.1 g/dL (ref 1.5–4.5)
Glucose: 104 mg/dL — ABNORMAL HIGH (ref 65–99)
Potassium: 5.1 mmol/L (ref 3.5–5.2)
Sodium: 145 mmol/L — ABNORMAL HIGH (ref 134–144)
Total Protein: 5 g/dL — ABNORMAL LOW (ref 6.0–8.5)

## 2019-09-13 LAB — CBC WITH DIFFERENTIAL
Basophils Absolute: 0 10*3/uL (ref 0.0–0.2)
Basos: 1 %
EOS (ABSOLUTE): 0.2 10*3/uL (ref 0.0–0.4)
Eos: 3 %
Hematocrit: 28.8 % — ABNORMAL LOW (ref 37.5–51.0)
Hemoglobin: 9.4 g/dL — ABNORMAL LOW (ref 13.0–17.7)
Immature Grans (Abs): 0.1 10*3/uL (ref 0.0–0.1)
Immature Granulocytes: 1 %
Lymphocytes Absolute: 0.8 10*3/uL (ref 0.7–3.1)
Lymphs: 13 %
MCH: 29.4 pg (ref 26.6–33.0)
MCHC: 32.6 g/dL (ref 31.5–35.7)
MCV: 90 fL (ref 79–97)
Monocytes Absolute: 0.6 10*3/uL (ref 0.1–0.9)
Monocytes: 9 %
NRBC: 1 % — ABNORMAL HIGH (ref 0–0)
Neutrophils Absolute: 4.7 10*3/uL (ref 1.4–7.0)
Neutrophils: 73 %
RBC: 3.2 x10E6/uL — ABNORMAL LOW (ref 4.14–5.80)
RDW: 14.1 % (ref 11.6–15.4)
WBC: 6.3 10*3/uL (ref 3.4–10.8)

## 2019-09-13 LAB — POCT URINALYSIS DIP (MANUAL ENTRY)
Bilirubin, UA: NEGATIVE
Blood, UA: NEGATIVE
Glucose, UA: 100 mg/dL — AB
Ketones, POC UA: NEGATIVE mg/dL
Nitrite, UA: NEGATIVE
Protein Ur, POC: >=300 mg/dL — AB
Spec Grav, UA: 1.025 (ref 1.010–1.025)
Urobilinogen, UA: 0.2 E.U./dL
pH, UA: 5.5 (ref 5.0–8.0)

## 2019-09-13 NOTE — Patient Instructions (Signed)
° ° ° °  If you have lab work done today you will be contacted with your lab results within the next 2 weeks.  If you have not heard from us then please contact us. The fastest way to get your results is to register for My Chart. ° ° °IF you received an x-ray today, you will receive an invoice from [facility]. Please contact [facility] at [phone] with questions or concerns regarding your invoice.  ° °IF you received labwork today, you will receive an invoice from [facility]. Please contact [facility] at [phone] with questions or concerns regarding your invoice.  ° °Our billing staff will not be able to assist you with questions regarding bills from these companies. ° °You will be contacted with the lab results as soon as they are available. The fastest way to get your results is to activate your My Chart account. Instructions are located on the last page of this paperwork. If you have not heard from us regarding the results in 2 weeks, please contact this office. °  ° ° ° °

## 2019-09-13 NOTE — Telephone Encounter (Signed)
-----   Message from Troy Sine, MD sent at 07/12/2019  9:30 AM EDT ----- [name],  Please notify pt of very severe sleep apnea and schedule for expeditious CPAP titration study.

## 2019-09-13 NOTE — Telephone Encounter (Signed)
Informed patient of sleep study results and patient understanding was verbalized. Patient understands his sleep study showed:  IMPRESSIONS - Severe obstructive sleep apnea occurred during this study (AHI 116.1/h) with absence of REM sleep.  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 - Moderate oxygen desaturation to a nadir of 81.0%. - The patient snored with loud snoring volume. Marland Kitchen PACs were noted during this study. - Clinically significant periodic limb movements did not occur during sleep. No significant associated arousals.  DIAGNOSIS - Obstructive Sleep Apnea (327.23 [G47.33 ICD-10]) - Central Sleep Apnea (327.27 [G47.37 ICD-10]) - Nocturnal Hypoxemia (327.26 [G47.36 ICD-10])  RECOMMENDATIONS - CPAP titration to determine optimal pressure required to alleviate sleep disordered breathing. BiPAP or ASV titration may be required to eliminate central sleep apnea.

## 2019-09-14 LAB — URINE CULTURE

## 2019-09-14 NOTE — Progress Notes (Signed)
If we could give Hector Brooks a call - we're seeing his Cr jump about three quarters of a point. It's very important that he continue to follow with nephrology.   We're seeing some mild anemia which is to be expected in kidney disease. Same with the electrolyte abnormalities  I'd like to see him back in 2-3 months  Thank you  Kathrin Ruddy, NP

## 2019-09-15 DIAGNOSIS — I129 Hypertensive chronic kidney disease with stage 1 through stage 4 chronic kidney disease, or unspecified chronic kidney disease: Secondary | ICD-10-CM | POA: Diagnosis not present

## 2019-09-15 DIAGNOSIS — N184 Chronic kidney disease, stage 4 (severe): Secondary | ICD-10-CM | POA: Diagnosis not present

## 2019-09-15 DIAGNOSIS — N052 Unspecified nephritic syndrome with diffuse membranous glomerulonephritis: Secondary | ICD-10-CM | POA: Diagnosis not present

## 2019-09-15 DIAGNOSIS — N189 Chronic kidney disease, unspecified: Secondary | ICD-10-CM | POA: Diagnosis not present

## 2019-09-15 DIAGNOSIS — D631 Anemia in chronic kidney disease: Secondary | ICD-10-CM | POA: Diagnosis not present

## 2019-09-15 DIAGNOSIS — N2581 Secondary hyperparathyroidism of renal origin: Secondary | ICD-10-CM | POA: Diagnosis not present

## 2019-09-16 DIAGNOSIS — R809 Proteinuria, unspecified: Secondary | ICD-10-CM | POA: Diagnosis not present

## 2019-09-22 ENCOUNTER — Other Ambulatory Visit (HOSPITAL_COMMUNITY): Payer: Self-pay

## 2019-09-23 ENCOUNTER — Encounter (HOSPITAL_COMMUNITY)
Admission: RE | Admit: 2019-09-23 | Discharge: 2019-09-23 | Disposition: A | Discharge status: Home / Self Care | Payer: Federal, State, Local not specified - PPO | Source: Ambulatory Visit | Attending: Nephrology | Admitting: Nephrology

## 2019-09-23 ENCOUNTER — Other Ambulatory Visit: Payer: Self-pay

## 2019-09-23 DIAGNOSIS — D631 Anemia in chronic kidney disease: Secondary | ICD-10-CM | POA: Diagnosis not present

## 2019-09-23 DIAGNOSIS — N183 Chronic kidney disease, stage 3 unspecified: Secondary | ICD-10-CM | POA: Insufficient documentation

## 2019-09-23 LAB — IRON AND TIBC
Iron: 71 ug/dL (ref 45–182)
Saturation Ratios: 29 % (ref 17.9–39.5)
TIBC: 246 ug/dL — ABNORMAL LOW (ref 250–450)
UIBC: 175 ug/dL

## 2019-09-23 LAB — POCT HEMOGLOBIN-HEMACUE: Hemoglobin: 10 g/dL — ABNORMAL LOW (ref 13.0–17.0)

## 2019-09-23 LAB — FERRITIN: Ferritin: 182 ng/mL (ref 24–336)

## 2019-09-23 MED ORDER — EPOETIN ALFA-EPBX 10000 UNIT/ML IJ SOLN
20000.0000 [IU] | INTRAMUSCULAR | Status: DC
  Administered 2019-09-23: 20000 [IU] via SUBCUTANEOUS

## 2019-09-23 MED ORDER — EPOETIN ALFA-EPBX 10000 UNIT/ML IJ SOLN
INTRAMUSCULAR | Status: AC
  Filled 2019-09-23: qty 2

## 2019-09-26 ENCOUNTER — Encounter: Payer: Self-pay | Admitting: Nephrology

## 2019-09-26 DIAGNOSIS — D631 Anemia in chronic kidney disease: Secondary | ICD-10-CM | POA: Insufficient documentation

## 2019-09-27 ENCOUNTER — Ambulatory Visit (INDEPENDENT_AMBULATORY_CARE_PROVIDER_SITE_OTHER): Payer: Federal, State, Local not specified - PPO | Admitting: Family Medicine

## 2019-10-07 ENCOUNTER — Other Ambulatory Visit: Payer: Self-pay

## 2019-10-07 ENCOUNTER — Encounter (HOSPITAL_COMMUNITY)
Admission: RE | Admit: 2019-10-07 | Discharge: 2019-10-07 | Disposition: A | Discharge status: Home / Self Care | Payer: Federal, State, Local not specified - PPO | Source: Ambulatory Visit | Attending: Nephrology | Admitting: Nephrology

## 2019-10-07 VITALS — BP 163/101 | HR 92 | Temp 97.9°F | Resp 18

## 2019-10-07 DIAGNOSIS — N184 Chronic kidney disease, stage 4 (severe): Secondary | ICD-10-CM | POA: Diagnosis not present

## 2019-10-07 DIAGNOSIS — D631 Anemia in chronic kidney disease: Secondary | ICD-10-CM | POA: Insufficient documentation

## 2019-10-07 DIAGNOSIS — N183 Chronic kidney disease, stage 3 unspecified: Secondary | ICD-10-CM | POA: Diagnosis not present

## 2019-10-07 LAB — POCT HEMOGLOBIN-HEMACUE: Hemoglobin: 10.1 g/dL — ABNORMAL LOW (ref 13.0–17.0)

## 2019-10-07 MED ORDER — EPOETIN ALFA-EPBX 10000 UNIT/ML IJ SOLN
20000.0000 [IU] | INTRAMUSCULAR | Status: DC
  Administered 2019-10-07: 20000 [IU] via SUBCUTANEOUS

## 2019-10-07 MED ORDER — EPOETIN ALFA-EPBX 10000 UNIT/ML IJ SOLN
INTRAMUSCULAR | Status: AC
  Filled 2019-10-07: qty 2

## 2019-10-10 ENCOUNTER — Telehealth: Payer: Self-pay | Admitting: *Deleted

## 2019-10-10 NOTE — Telephone Encounter (Signed)
Faxed clinicals to [facility] for CPAP titration PA. [phone].

## 2019-10-11 ENCOUNTER — Ambulatory Visit (INDEPENDENT_AMBULATORY_CARE_PROVIDER_SITE_OTHER): Payer: Federal, State, Local not specified - PPO | Admitting: Family Medicine

## 2019-10-11 ENCOUNTER — Encounter (INDEPENDENT_AMBULATORY_CARE_PROVIDER_SITE_OTHER): Payer: Self-pay | Admitting: Family Medicine

## 2019-10-11 ENCOUNTER — Other Ambulatory Visit: Payer: Self-pay

## 2019-10-11 VITALS — BP 127/83 | HR 72 | Temp 98.3°F | Ht 69.0 in | Wt 317.0 lb

## 2019-10-11 DIAGNOSIS — R0602 Shortness of breath: Secondary | ICD-10-CM

## 2019-10-11 DIAGNOSIS — N184 Chronic kidney disease, stage 4 (severe): Secondary | ICD-10-CM

## 2019-10-11 DIAGNOSIS — R7303 Prediabetes: Secondary | ICD-10-CM

## 2019-10-11 DIAGNOSIS — F3289 Other specified depressive episodes: Secondary | ICD-10-CM

## 2019-10-11 DIAGNOSIS — I10 Essential (primary) hypertension: Secondary | ICD-10-CM

## 2019-10-11 DIAGNOSIS — Z9189 Other specified personal risk factors, not elsewhere classified: Secondary | ICD-10-CM | POA: Diagnosis not present

## 2019-10-11 DIAGNOSIS — R948 Abnormal results of function studies of other organs and systems: Secondary | ICD-10-CM

## 2019-10-11 DIAGNOSIS — R5383 Other fatigue: Secondary | ICD-10-CM

## 2019-10-11 DIAGNOSIS — Z6841 Body Mass Index (BMI) 40.0 and over, adult: Secondary | ICD-10-CM

## 2019-10-11 DIAGNOSIS — E559 Vitamin D deficiency, unspecified: Secondary | ICD-10-CM

## 2019-10-11 DIAGNOSIS — Z0289 Encounter for other administrative examinations: Secondary | ICD-10-CM

## 2019-10-11 DIAGNOSIS — E7849 Other hyperlipidemia: Secondary | ICD-10-CM | POA: Diagnosis not present

## 2019-10-11 DIAGNOSIS — M1711 Unilateral primary osteoarthritis, right knee: Secondary | ICD-10-CM

## 2019-10-11 DIAGNOSIS — D631 Anemia in chronic kidney disease: Secondary | ICD-10-CM

## 2019-10-11 DIAGNOSIS — G4733 Obstructive sleep apnea (adult) (pediatric): Secondary | ICD-10-CM

## 2019-10-11 NOTE — Progress Notes (Signed)
Dear Dr. Erlinda Brooks,   Thank you for referring Hector Brooks to our clinic. The following note includes my evaluation and treatment recommendations.  Chief Complaint:   Hector Brooks (MR# [id number]) is a 49 y.o. male who presents for evaluation and treatment of Hector and related comorbidities. Current BMI is Body mass index is 46.81 kg/m. Hector Brooks has been struggling with his weight for many years and has been unsuccessful in either losing weight, maintaining weight loss, or reaching his healthy weight goal.  Hector Brooks is currently in the action stage of change and ready to dedicate time achieving and maintaining a healthier weight. Hector Brooks is interested in becoming our patient and working on intensive lifestyle modifications including (but not limited to) diet and exercise for weight loss.  Hector Brooks is interested in bariatric surgery.  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  Hector Brooks is married and lives with his wife and 3 children (54, 80, and 43).  Hector Brooks has 6 children and 1 grandchild.   Hector Brooks's habits were reviewed today and are as follows: his desired weight loss is 75 pounds, Hector Brooks started gaining weight after college, his heaviest weight ever was 340 pounds, Hector Brooks craves cheeseburgers, pizza, [place] food, chicken wings, and rice with gravy, Hector Brooks skips breakfast frequently, Hector Brooks frequently makes poor food choices, Hector Brooks frequently eats larger portions than normal and Hector Brooks struggles with emotional eating.  Depression Screen Hector Brooks's Food and Mood (modified PHQ-9) score was 2.  Depression screen [facility] 2/9 10/11/2019  Decreased Interest 0  Down, Depressed, Hopeless 0  PHQ - 2 Score 0  Altered sleeping 1  Tired, decreased energy 1  Change in appetite 0  Feeling bad or failure about yourself  0  Trouble concentrating 0  Moving slowly or fidgety/restless 0  Suicidal thoughts 0  PHQ-9 Score 2  Difficult doing work/chores Not difficult at all   Subjective:   1. Other  fatigue Hector Brooks admits to daytime somnolence and reports waking up still tired. Patent has a history of symptoms of daytime fatigue, morning fatigue and snoring. Hector Brooks generally gets 6 or 7 hours of sleep per night, and states that Hector Brooks has generally restful sleep. Snoring is present. Apneic episodes are not present. Epworth Sleepiness Score is 8.  2. SOB (shortness of breath) on exertion Hector Brooks notes increasing shortness of breath with exercising and seems to be worsening over time with weight gain. Hector Brooks notes getting out of breath sooner with activity than Hector Brooks used to. This has gotten worse recently. Hector Brooks denies shortness of breath at rest or orthopnea.  3. Essential hypertension Review: taking medications as instructed, no medication side effects noted, no chest pain on exertion, no dyspnea on exertion, no swelling of ankles.   BP Readings from Last 3 Encounters:  10/11/19 127/83  10/07/19 (!) 163/101  09/23/19 (!) 146/93   4. Other hyperlipidemia Hector Brooks denies any chest pain, claudication or myalgias.  Hector Brooks is taking atorvastatin.  Lab Results  Component Value Date   ALT 18 09/13/2019   AST 12 09/13/2019   ALKPHOS 59 09/13/2019   BILITOT <0.2 09/13/2019   Lab Results  Component Value Date   CHOL 162 08/16/2019   HDL 54 08/16/2019   LDLCALC 93 08/16/2019   TRIG 78 08/16/2019   CHOLHDL 3.0 08/16/2019   5. Abnormal metabolism Hector Brooks's metabolism is lower than it should be. See vitals.   6. Stage 4 chronic kidney disease (Hector Brooks) Hector Brooks is followed by Hector Brooks.  Hector Brooks  sees Hector Brooks.  Hector Brooks was diagnosed with AKI 10 years ago, and 2-3 years ago, it came back.  Lab Results  Component Value Date   CREATININE 5.49 (HH) 09/13/2019   CREATININE 4.70 (HH) 08/16/2019   CREATININE 2.46 (H) 01/18/2019   Lab Results  Component Value Date   CREATININE 5.49 ([name]) 09/13/2019   BUN 45 (H) 09/13/2019   NA 145 (H) 09/13/2019   K 5.1 09/13/2019   CL 113 (H) 09/13/2019   CO2 21  09/13/2019   7. Anemia in stage 4 chronic kidney disease (Hector Brooks) Hector Brooks is followed by Hector Brooks.  CBC Latest Ref Rng & Units 10/07/2019 09/23/2019 09/13/2019  WBC 3.4 - 10.8 x10E3/uL - - 6.3  Hemoglobin 13.0 - 17.0 g/dL 10.1(L) 10.0(L) 9.4(L)  Hematocrit 37.5 - 51.0 % - - 28.8(L)  Platelets 150 - 450 x10E3/uL - - -   Lab Results  Component Value Date   IRON 71 09/23/2019   TIBC 246 (L) 09/23/2019   FERRITIN 182 09/23/2019   8. Prediabetes A1c was 5.9 at it's highest.  Lab Results  Component Value Date   HGBA1C 5.4 08/16/2019   9. Vitamin D deficiency Hector Brooks is currently taking OTC vitamin D. Hector Brooks denies nausea, vomiting or muscle weakness.  10. OSA (obstructive sleep apnea) Hector Brooks has a diagnosis of sleep apnea. Hector Brooks reports that Hector Brooks is not using a CPAP regularly.  Hector Brooks says Hector Brooks was never told to wear a CPAP.  Hector Brooks had a sleep study a few weeks ago.  Epworth score is 8.   11. Osteoarthritis of right knee, unspecified osteoarthritis type Hector Brooks needs a BMI of less than 40 so Hector Brooks can get TKR.  12. Other depression, with emotional eating Hector Brooks is struggling with emotional eating and using food for comfort to the extent that it is negatively impacting his health. Hector Brooks has been working on behavior modification techniques to help reduce his emotional eating and has been unsuccessful. Hector Brooks shows no sign of suicidal or homicidal ideations.  PHQ-9 is 2.  Assessment/Plan:   1. Other fatigue Hector Brooks does feel that his weight is causing his energy to be lower than it should be. Fatigue may be related to Hector, depression or many other causes. Labs will be ordered, and in the meanwhile, Hector Brooks will focus on self care including making healthy food choices, increasing physical activity and focusing on stress reduction.  - EKG 12-Lead  2. SOB (shortness of breath) on exertion Hector Brooks does feel that Hector Brooks gets out of breath more easily that Hector Brooks used to when Hector Brooks exercises. Damarkus's shortness of breath appears to  be Hector related and exercise induced. Hector Brooks has agreed to work on weight loss and gradually increase exercise to treat his exercise induced shortness of breath. Will continue to monitor closely.  3. Essential hypertension Hector Brooks is working on healthy weight loss and exercise to improve blood pressure control. We will watch for signs of hypotension as Hector Brooks continues his lifestyle modifications.  4. Other hyperlipidemia Cardiovascular risk and specific lipid/LDL goals reviewed.  We discussed several lifestyle modifications today and Hector Brooks will continue to work on diet, exercise and weight loss efforts.   5. Abnormal metabolism Will continue to monitor.  6. Stage 4 chronic kidney disease (Hector Brooks) Lab results and trends reviewed. Hector Brooks is followed by Hector Brooks for this problem and has already been told that Hector Brooks may to start dialysis soon. We discussed several lifestyle modifications today and Hector Brooks will continue to work on diet, exercise and weight loss efforts. Avoid  nephrotoxic medications. Avoid buying foods that are: processed, frozen, or prepackaged to avoid excess salt.  7. Anemia in stage 4 chronic kidney disease (Hector Brooks) Followed by Hector Brooks for this problem. Those encounter notes were reviewed.  8. Prediabetes Not at goal. Goal is HgbA1c < 5.7 and insulin level closer to 5. Hector Brooks will continue to work on weight loss, exercise, and decreasing simple carbohydrates to help decrease the risk of diabetes.   9. Vitamin D deficiency Optimal goal > 50 ng/dL. There is also evidence to support a goal of >70 ng/dL in patients with cancer and heart disease. Low Vitamin D level contributes to fatigue and are associated with Hector, breast, and colon cancer.   10. OSA (obstructive sleep apnea) Intensive lifestyle modifications are the first line treatment for this issue. We discussed several lifestyle modifications today and Hector Brooks will continue to work on diet, exercise and weight loss efforts. We will continue to  monitor. Orders and follow up as documented in patient record.   11. Osteoarthritis of right knee Goal BMI < 40 for right TKA.   12. Other depression, with emotional eating Behavior modification techniques were discussed today to help Hector Brooks deal with his emotional/non-hunger eating behaviors.    13. At risk for heart disease Hector Brooks was given approximately 15 minutes of coronary artery disease prevention counseling today. Hector Brooks is 49 y.o. male and has risk factors for heart disease including Hector, metabolic syndrome, and CKD. We discussed intensive lifestyle modifications today with an emphasis on specific weight loss instructions and strategies.   14. 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 I recommend Hector Brooks begin the structured treatment plan as follows:  Hector Brooks has agreed to the Category 2 Plan.  Exercise goals: No exercise has been prescribed at this time.   Behavioral modification strategies: increasing lean protein intake, decreasing simple carbohydrates, increasing vegetables, decreasing liquid calories, decreasing sodium intake and increasing high fiber foods.  Hector Brooks was informed of the importance of frequent follow-up visits to maximize his success with intensive lifestyle modifications for his multiple health conditions. Hector Brooks was informed we would discuss his lab results at his next visit unless there is a critical issue that needs to be addressed sooner. Hector Brooks agreed to keep his next visit at the agreed upon time to discuss these results.  Objective:   Blood pressure 127/83, pulse 72, temperature 98.3 F (36.8 C), temperature source Oral, height 5\' 9"  (1.753 m), weight (!) 317 lb (143.8 kg), SpO2 97 %. Body mass index is 46.81 kg/m.  EKG: Normal sinus rhythm, rate 90 bpm.  Indirect  Calorimeter completed today shows a VO2 of 269 and a REE of 1871.  His calculated basal metabolic rate is 1093 thus his basal metabolic rate is worse than expected.  General: Cooperative, alert, well developed, in no acute distress. HEENT: Conjunctivae and lids unremarkable. Cardiovascular: Regular rhythm.  Lungs: Normal work of breathing. Neurologic: No focal deficits.   Lab Results  Component Value Date   CREATININE 5.49 (HH) 09/13/2019   BUN 45 (H) 09/13/2019   NA 145 (H) 09/13/2019   K 5.1 09/13/2019   CL 113 (H) 09/13/2019   CO2 21 09/13/2019   Lab Results  Component Value Date   ALT 18 09/13/2019   AST 12 09/13/2019   ALKPHOS 59 09/13/2019  BILITOT <0.2 09/13/2019   Lab Results  Component Value Date   HGBA1C 5.4 08/16/2019   HGBA1C 5.9 (H) 01/04/2019   HGBA1C 5.9 07/02/2017   Lab Results  Component Value Date   TSH 3.280 08/16/2019   Lab Results  Component Value Date   CHOL 162 08/16/2019   HDL 54 08/16/2019   LDLCALC 93 08/16/2019   TRIG 78 08/16/2019   CHOLHDL 3.0 08/16/2019   Lab Results  Component Value Date   WBC 6.3 09/13/2019   HGB 10.1 (L) 10/07/2019   HCT 28.8 (L) 09/13/2019   MCV 90 09/13/2019   PLT 226 08/16/2019   Lab Results  Component Value Date   IRON 71 09/23/2019   TIBC 246 (L) 09/23/2019   FERRITIN 182 09/23/2019   Attestation Statements:   This is the patient's first visit at [facility]. The patient's NEW PATIENT PACKET was reviewed at length. Included in the packet: current and past health history, medications, allergies, ROS, gynecologic history (women only), surgical history, family history, social history, weight history, weight loss surgery history (for those that have had weight loss surgery), nutritional evaluation, mood and food questionnaire, PHQ9, Epworth questionnaire, sleep habits questionnaire, patient life and health improvement goals questionnaire. These will all be scanned into the patient's chart  under media.   During the visit, I independently reviewed the patient's EKG, bioimpedance scale results, and indirect calorimeter results. I used this information to tailor a meal plan for the patient that will help him to lose weight and will improve his Hector-related conditions going forward. I performed a medically necessary appropriate examination and/or evaluation. I discussed the assessment and treatment plan with the patient. The patient was provided an opportunity to ask questions and all were answered. The patient agreed with the plan and demonstrated an understanding of the instructions. Labs were ordered at this visit and will be reviewed at the next visit unless more critical results need to be addressed immediately. Clinical information was updated and documented in the EMR.   I, Water quality scientist, CMA, am acting as transcriptionist for Briscoe Deutscher, DO  I have reviewed the above documentation for accuracy and completeness, and I agree with the above. Briscoe Deutscher, DO

## 2019-10-21 ENCOUNTER — Other Ambulatory Visit: Payer: Self-pay

## 2019-10-21 ENCOUNTER — Encounter (HOSPITAL_COMMUNITY)
Admission: RE | Admit: 2019-10-21 | Discharge: 2019-10-21 | Disposition: A | Discharge status: Home / Self Care | Payer: Federal, State, Local not specified - PPO | Source: Ambulatory Visit | Attending: Nephrology | Admitting: Nephrology

## 2019-10-21 VITALS — BP 160/94 | HR 90 | Temp 97.5°F | Resp 18

## 2019-10-21 DIAGNOSIS — N183 Chronic kidney disease, stage 3 unspecified: Secondary | ICD-10-CM | POA: Diagnosis not present

## 2019-10-21 DIAGNOSIS — N184 Chronic kidney disease, stage 4 (severe): Secondary | ICD-10-CM | POA: Diagnosis not present

## 2019-10-21 DIAGNOSIS — D631 Anemia in chronic kidney disease: Secondary | ICD-10-CM

## 2019-10-21 LAB — FERRITIN: Ferritin: 157 ng/mL (ref 24–336)

## 2019-10-21 LAB — IRON AND TIBC
Iron: 71 ug/dL (ref 45–182)
Saturation Ratios: 32 % (ref 17.9–39.5)
TIBC: 221 ug/dL — ABNORMAL LOW (ref 250–450)
UIBC: 150 ug/dL

## 2019-10-21 LAB — POCT HEMOGLOBIN-HEMACUE: Hemoglobin: 10.3 g/dL — ABNORMAL LOW (ref 13.0–17.0)

## 2019-10-21 MED ORDER — EPOETIN ALFA-EPBX 10000 UNIT/ML IJ SOLN: 20000.0000 [IU] | INTRAMUSCULAR | Status: DC

## 2019-10-21 MED ORDER — EPOETIN ALFA-EPBX 10000 UNIT/ML IJ SOLN
INTRAMUSCULAR | Status: AC
  Administered 2019-10-21: 20000 [IU]
  Filled 2019-10-21: qty 2

## 2019-11-01 ENCOUNTER — Encounter (INDEPENDENT_AMBULATORY_CARE_PROVIDER_SITE_OTHER): Payer: Self-pay | Admitting: Family Medicine

## 2019-11-01 ENCOUNTER — Ambulatory Visit (INDEPENDENT_AMBULATORY_CARE_PROVIDER_SITE_OTHER): Payer: Federal, State, Local not specified - PPO | Admitting: Family Medicine

## 2019-11-01 ENCOUNTER — Other Ambulatory Visit: Payer: Self-pay

## 2019-11-01 VITALS — BP 165/110 | HR 93 | Temp 98.3°F | Ht 69.0 in | Wt 315.0 lb

## 2019-11-01 DIAGNOSIS — I10 Essential (primary) hypertension: Secondary | ICD-10-CM

## 2019-11-01 DIAGNOSIS — N189 Chronic kidney disease, unspecified: Secondary | ICD-10-CM | POA: Diagnosis not present

## 2019-11-01 DIAGNOSIS — M1711 Unilateral primary osteoarthritis, right knee: Secondary | ICD-10-CM | POA: Diagnosis not present

## 2019-11-01 DIAGNOSIS — Z6841 Body Mass Index (BMI) 40.0 and over, adult: Secondary | ICD-10-CM

## 2019-11-01 DIAGNOSIS — E8881 Metabolic syndrome: Secondary | ICD-10-CM

## 2019-11-01 DIAGNOSIS — Z9189 Other specified personal risk factors, not elsewhere classified: Secondary | ICD-10-CM

## 2019-11-01 DIAGNOSIS — R7303 Prediabetes: Secondary | ICD-10-CM

## 2019-11-02 NOTE — Progress Notes (Signed)
Chief Complaint:   OBESITY Hector Brooks is here to discuss his progress with his obesity treatment plan along with follow-up of his obesity related diagnoses. [name] is on the Category 2 Plan and states he is following his eating plan approximately 80-90% of the time. [name] states he is doing cardio and strength training for 30 minutes 2-3 times per week.  Today's visit was #: 2 Starting weight: 317 lbs Starting date: 10/11/2019 Today's weight: 315 lbs Today's date: 11/01/2019 Total lbs lost to date: 2 lbs Total lbs lost since last in-office visit: 2 lbs  Interim History: Hector Brooks says that the summer heat makes his appetite decrease.  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  Skipping breakfast. For lunch is is having a [place] sandwich.  He admits that he is not getting enough protein.  He is making sure to maintain a low salt diet.  Blood pressure is elevated today.  He says he just took his medication.  Denies symptoms.  Will see Hector Brooks in 2 weeks.  Assessment/Plan:   1. Osteoarthritis of right knee Followed by Orthopedics. Hector Brooks requires a BMI of <40. The current medical regimen is effective;  continue present plan and medications.  2. Prediabetes Current treatment plan is effective, no change in therapy. We will continue to monitor. Recommended sodium restriction. Cardiovascular risk and specific lipid goals reviewed.  3. Essential hypertension Blood pressure is elevated today. He denies HA, dizziness, CP, SOB, edema. Will continue to monitor symptoms as they relate to his weight loss journey.  4. Chronic Brooks disease Lab results and trends reviewed. We discussed several lifestyle modifications today and he will continue to work on diet, exercise and weight loss efforts. Avoid nephrotoxic medications. He has an appointment with [place] Brooks next week.    5. Metabolic syndrome Goal: Lose 7-10% of starting weight. We discussed several  lifestyle modifications today and he will continue to work on diet, exercise and weight loss efforts.   6. At risk for heart disease [name] was given approximately 15 minutes of coronary artery disease prevention counseling today. He is 49 y.o. male and has risk factors for heart disease including obesity and metabolic syndrome. We again discussed intensive lifestyle modifications today with an emphasis on specific weight loss instructions and strategies.   7. Class 3 severe obesity with serious comorbidity and body mass index (BMI) of 45.0 to 49.9 in adult, unspecified obesity type (HCC) Hector Brooks is currently in the action stage of change. As such, his goal is to continue with weight loss efforts. He has agreed to the Category 2 Plan.   Exercise goals: As is.  Behavioral modification strategies: increasing lean protein intake, increasing water intake and decreasing sodium intake.  Hector Brooks has agreed to follow-up with our clinic in 3 weeks. He was informed of the importance of frequent follow-up visits to maximize his success with intensive lifestyle modifications for his multiple health conditions.   Objective:   Blood pressure (!) 165/110, pulse 93, temperature 98.3 F (36.8 C), temperature source Oral, height 5\' 9"  (1.753 m), weight (!) 315 lb (142.9 kg), SpO2 95 %. Body mass index is 46.52 kg/m.  General: Cooperative, alert, well developed, in no acute distress. HEENT: Conjunctivae and lids unremarkable. Cardiovascular: Regular rhythm.  Lungs: Normal work of breathing. Neurologic: No focal deficits.   Lab Results  Component Value Date   CREATININE 5.49 (HH) 09/13/2019   BUN 45 (H) 09/13/2019   NA 145 (H) 09/13/2019  K 5.1 09/13/2019   CL 113 (H) 09/13/2019   CO2 21 09/13/2019   Lab Results  Component Value Date   ALT 18 09/13/2019   AST 12 09/13/2019   ALKPHOS 59 09/13/2019   BILITOT <0.2 09/13/2019   Lab Results  Component Value Date   HGBA1C 5.4 08/16/2019    HGBA1C 5.9 (H) 01/04/2019   HGBA1C 5.9 07/02/2017   Lab Results  Component Value Date   TSH 3.280 08/16/2019   Lab Results  Component Value Date   CHOL 162 08/16/2019   HDL 54 08/16/2019   LDLCALC 93 08/16/2019   TRIG 78 08/16/2019   CHOLHDL 3.0 08/16/2019   Lab Results  Component Value Date   WBC 6.3 09/13/2019   HGB 10.3 (L) 10/21/2019   HCT 28.8 (L) 09/13/2019   MCV 90 09/13/2019   PLT 226 08/16/2019   Lab Results  Component Value Date   IRON 71 10/21/2019   TIBC 221 (L) 10/21/2019   FERRITIN 157 10/21/2019   Attestation Statements:   Reviewed by clinician on day of visit: allergies, medications, problem list, medical history, surgical history, family history, social history, and previous encounter notes.  I, Water quality scientist, CMA, am acting as transcriptionist for Hector Deutscher, DO  I have reviewed the above documentation for accuracy and completeness, and I agree with the above. Hector Deutscher, DO

## 2019-11-04 ENCOUNTER — Encounter (HOSPITAL_COMMUNITY)
Admission: RE | Admit: 2019-11-04 | Discharge: 2019-11-04 | Disposition: A | Discharge status: Home / Self Care | Payer: Federal, State, Local not specified - PPO | Source: Ambulatory Visit | Attending: Nephrology | Admitting: Nephrology

## 2019-11-04 ENCOUNTER — Other Ambulatory Visit: Payer: Self-pay

## 2019-11-04 VITALS — BP 148/90 | HR 82 | Resp 16

## 2019-11-04 DIAGNOSIS — N184 Chronic kidney disease, stage 4 (severe): Secondary | ICD-10-CM | POA: Diagnosis not present

## 2019-11-04 DIAGNOSIS — D631 Anemia in chronic kidney disease: Secondary | ICD-10-CM | POA: Diagnosis not present

## 2019-11-04 LAB — POCT HEMOGLOBIN-HEMACUE: Hemoglobin: 10.7 g/dL — ABNORMAL LOW (ref 13.0–17.0)

## 2019-11-04 MED ORDER — EPOETIN ALFA-EPBX 10000 UNIT/ML IJ SOLN
INTRAMUSCULAR | Status: AC
  Administered 2019-11-04: 20000 [IU] via SUBCUTANEOUS
  Filled 2019-11-04: qty 2

## 2019-11-04 MED ORDER — EPOETIN ALFA-EPBX 10000 UNIT/ML IJ SOLN: 20000.0000 [IU] | INTRAMUSCULAR | Status: DC

## 2019-11-16 ENCOUNTER — Telehealth: Payer: Self-pay | Admitting: *Deleted

## 2019-11-16 DIAGNOSIS — N2581 Secondary hyperparathyroidism of renal origin: Secondary | ICD-10-CM | POA: Diagnosis not present

## 2019-11-16 DIAGNOSIS — I129 Hypertensive chronic kidney disease with stage 1 through stage 4 chronic kidney disease, or unspecified chronic kidney disease: Secondary | ICD-10-CM | POA: Diagnosis not present

## 2019-11-16 DIAGNOSIS — D631 Anemia in chronic kidney disease: Secondary | ICD-10-CM | POA: Diagnosis not present

## 2019-11-16 DIAGNOSIS — N189 Chronic kidney disease, unspecified: Secondary | ICD-10-CM | POA: Diagnosis not present

## 2019-11-16 DIAGNOSIS — N185 Chronic kidney disease, stage 5: Secondary | ICD-10-CM | POA: Diagnosis not present

## 2019-11-16 DIAGNOSIS — N052 Unspecified nephritic syndrome with diffuse membranous glomerulonephritis: Secondary | ICD-10-CM | POA: Diagnosis not present

## 2019-11-16 NOTE — Telephone Encounter (Signed)
[name] states she is returning a call to [name] regarding fax correct fax number. She states the number [name] provided verbally did not match the number on the cover sheet. She states their fax number is [phone] if [name] would like to fax a request to them.

## 2019-11-17 ENCOUNTER — Encounter (INDEPENDENT_AMBULATORY_CARE_PROVIDER_SITE_OTHER): Payer: Self-pay | Admitting: Family Medicine

## 2019-11-17 ENCOUNTER — Other Ambulatory Visit: Payer: Self-pay

## 2019-11-17 ENCOUNTER — Ambulatory Visit (INDEPENDENT_AMBULATORY_CARE_PROVIDER_SITE_OTHER): Payer: Federal, State, Local not specified - PPO | Admitting: Family Medicine

## 2019-11-17 VITALS — BP 124/77 | HR 88 | Temp 98.3°F | Ht 69.0 in | Wt 301.0 lb

## 2019-11-17 DIAGNOSIS — N184 Chronic kidney disease, stage 4 (severe): Secondary | ICD-10-CM | POA: Diagnosis not present

## 2019-11-17 DIAGNOSIS — D631 Anemia in chronic kidney disease: Secondary | ICD-10-CM

## 2019-11-17 DIAGNOSIS — I129 Hypertensive chronic kidney disease with stage 1 through stage 4 chronic kidney disease, or unspecified chronic kidney disease: Secondary | ICD-10-CM

## 2019-11-17 DIAGNOSIS — M1711 Unilateral primary osteoarthritis, right knee: Secondary | ICD-10-CM

## 2019-11-17 DIAGNOSIS — Z6841 Body Mass Index (BMI) 40.0 and over, adult: Secondary | ICD-10-CM

## 2019-11-18 ENCOUNTER — Encounter (HOSPITAL_COMMUNITY)
Admission: RE | Admit: 2019-11-18 | Discharge: 2019-11-18 | Disposition: A | Discharge status: Home / Self Care | Payer: Federal, State, Local not specified - PPO | Source: Ambulatory Visit | Attending: Nephrology | Admitting: Nephrology

## 2019-11-18 VITALS — BP 169/109 | HR 76 | Temp 97.3°F | Resp 18

## 2019-11-18 DIAGNOSIS — D631 Anemia in chronic kidney disease: Secondary | ICD-10-CM | POA: Diagnosis not present

## 2019-11-18 DIAGNOSIS — N184 Chronic kidney disease, stage 4 (severe): Secondary | ICD-10-CM | POA: Diagnosis not present

## 2019-11-18 LAB — IRON AND TIBC
Iron: 70 ug/dL (ref 45–182)
Saturation Ratios: 29 % (ref 17.9–39.5)
TIBC: 245 ug/dL — ABNORMAL LOW (ref 250–450)
UIBC: 175 ug/dL

## 2019-11-18 LAB — FERRITIN: Ferritin: 130 ng/mL (ref 24–336)

## 2019-11-18 LAB — POCT HEMOGLOBIN-HEMACUE: Hemoglobin: 11.1 g/dL — ABNORMAL LOW (ref 13.0–17.0)

## 2019-11-18 MED ORDER — EPOETIN ALFA-EPBX 10000 UNIT/ML IJ SOLN: 20000.0000 [IU] | INTRAMUSCULAR | Status: DC

## 2019-11-18 MED ORDER — EPOETIN ALFA-EPBX 10000 UNIT/ML IJ SOLN
INTRAMUSCULAR | Status: AC
  Administered 2019-11-18: 20000 [IU] via SUBCUTANEOUS
  Filled 2019-11-18: qty 2

## 2019-11-21 NOTE — Progress Notes (Signed)
Chief Complaint:   OBESITY Hector Brooks is here to discuss his progress with his obesity treatment plan along with follow-up of his obesity related diagnoses. Hector Brooks is on the Category 2 Plan and states he is following his eating plan approximately 90% of the time. Hector Brooks states he is walking for 30-60 minutes 3-4 times per week.  Today's visit was #: 3 Starting weight: 317 lbs Starting date: 10/11/2019 Today's weight: 301 lbs Today's date: 11/17/2019 Total lbs lost to date: 16 lbs Total lbs lost since last in-office visit: 14 lbs Total weight loss percentage to date: -5.05%  Interim History: Hector Brooks says he has stopped sodas and sweets.  He is focused on protein and vegetables.  He has increased his intake of water and Gatorade Zero.  He has also increased his exercise.  He has been going to the gym every other day for 30 minutes.  He saw Nephrology yesterday, Dr. Carolin Sicks.  He will see them again in 2 months.  He said they are pleased with his weight loss.  Mentioned possible renal transplant if continued weight loss.    Assessment/Plan:   1. Renal hypertension At goal today.  Continue Norvasc, hydralazine, Lasix, labetalol.  Watch for low blood pressures.  Low salt diet.  Increase water intake.  BP Readings from Last 3 Encounters:  11/18/19 (!) 169/109  11/17/19 124/77  11/04/19 (!) 148/90   2. Stage 4 chronic kidney disease ([place]) Followed by Dr. Carolin Sicks.  Due to membranous GN and HTN. Will continue to monitor symptoms as they relate to his weight loss journey.  Lab Results  Component Value Date   CREATININE 5.49 (HH) 09/13/2019   CREATININE 4.70 (HH) 08/16/2019   CREATININE 2.46 (H) 01/18/2019   Lab Results  Component Value Date   CREATININE 5.49 ([place]) 09/13/2019   BUN 45 (H) 09/13/2019   NA 145 (H) 09/13/2019   K 5.1 09/13/2019   CL 113 (H) 09/13/2019   CO2 21 09/13/2019   3. Primary osteoarthritis of right knee Looking at replacement around early 2022.  Needs  BMI <40.  4. Anemia in stage 4 chronic kidney disease ([place]) Followed by Dr. Carolin Sicks.  Stable.  CBC Latest Ref Rng & Units 11/18/2019 11/04/2019 10/21/2019  WBC 3.4 - 10.8 x10E3/uL - - -  Hemoglobin 13.0 - 17.0 g/dL 11.1(L) 10.7(L) 10.3(L)  Hematocrit 37.5 - 51.0 % - - -  Platelets 150 - 450 x10E3/uL - - -   Lab Results  Component Value Date   IRON 70 11/18/2019   TIBC 245 (L) 11/18/2019   FERRITIN 130 11/18/2019   5. Class 3 severe obesity with serious comorbidity and body mass index (BMI) of 40.0 to 44.9 in adult, unspecified obesity type (HCC) Hector Brooks is currently in the action stage of change. As such, his goal is to continue with weight loss efforts. He has agreed to the Category 2 Plan.   Exercise goals: For substantial health benefits, adults should do at least 150 minutes (2 hours and 30 minutes) a week of moderate-intensity, or 75 minutes (1 hour and 15 minutes) a week of vigorous-intensity aerobic physical activity, or an equivalent combination of moderate- and vigorous-intensity aerobic activity. Aerobic activity should be performed in episodes of at least 10 minutes, and preferably, it should be spread throughout the week.  Behavioral modification strategies: decreasing simple carbohydrates, increasing vegetables, increasing water intake and decreasing sodium intake.  Drew has agreed to follow-up with our clinic in 3 weeks. He was informed of the  importance of frequent follow-up visits to maximize his success with intensive lifestyle modifications for his multiple health conditions.   Objective:   Blood pressure 124/77, pulse 88, temperature 98.3 F (36.8 C), temperature source Oral, height 5\' 9"  (1.753 m), weight (!) 301 lb (136.5 kg), SpO2 97 %. Body mass index is 44.45 kg/m.  General: Cooperative, alert, well developed, in no acute distress. HEENT: Conjunctivae and lids unremarkable. Cardiovascular: Regular rhythm.  Lungs: Normal work of breathing. Neurologic: No  focal deficits.   Lab Results  Component Value Date   CREATININE 5.49 (HH) 09/13/2019   BUN 45 (H) 09/13/2019   NA 145 (H) 09/13/2019   K 5.1 09/13/2019   CL 113 (H) 09/13/2019   CO2 21 09/13/2019   Lab Results  Component Value Date   ALT 18 09/13/2019   AST 12 09/13/2019   ALKPHOS 59 09/13/2019   BILITOT <0.2 09/13/2019   Lab Results  Component Value Date   HGBA1C 5.4 08/16/2019   HGBA1C 5.9 (H) 01/04/2019   HGBA1C 5.9 07/02/2017   Lab Results  Component Value Date   TSH 3.280 08/16/2019   Lab Results  Component Value Date   CHOL 162 08/16/2019   HDL 54 08/16/2019   LDLCALC 93 08/16/2019   TRIG 78 08/16/2019   CHOLHDL 3.0 08/16/2019   Lab Results  Component Value Date   WBC 6.3 09/13/2019   HGB 11.1 (L) 11/18/2019   HCT 28.8 (L) 09/13/2019   MCV 90 09/13/2019   PLT 226 08/16/2019   Lab Results  Component Value Date   IRON 70 11/18/2019   TIBC 245 (L) 11/18/2019   FERRITIN 130 11/18/2019   Attestation Statements:   Reviewed by clinician on day of visit: allergies, medications, problem list, medical history, surgical history, family history, social history, and previous encounter notes.  Time spent on visit including pre-visit chart review and post-visit care and charting was 40 minutes.   I, Water quality scientist, CMA, am acting as transcriptionist for Briscoe Deutscher, DO  I have reviewed the above documentation for accuracy and completeness, and I agree with the above. Briscoe Deutscher, DO

## 2019-11-29 ENCOUNTER — Encounter: Payer: Self-pay | Admitting: Registered Nurse

## 2019-11-29 NOTE — Progress Notes (Signed)
Acute Office Visit  Subjective:    Patient ID: Hector Brooks, male    DOB: 15-Aug-1970, 49 y.o.   MRN: [id number]  Chief Complaint  Patient presents with   Urinary Tract Infection    Patient states over the last two weeks he feels like he has bladder but when he goes to urinate little to none comes out. Per patient he has been getting a little better and that he also already has kidney disease and made an appointment with [facility] as well.    HPI Patient is in today for urinary symptoms Has had trouble voiding. Notes that he feels like he needs to go but only passes scant amounts of urine Some discomfort going on as well.  No flank pain, fevers, chills, fatigue No discharge from penis, no testicular pain  No other acute changes.   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   Sleep apnea    Vitamin D deficiency     Past Surgical History:  Procedure Laterality Date   MENISCUS REPAIR Right 12/14    Family History  Problem Relation Age of Onset   Cancer Father    Diabetes Father    Drug abuse Father    Hyperlipidemia Sister    Diabetes Maternal Grandfather    Heart disease Maternal Grandfather     Social History   Socioeconomic History   Marital status: Married    Spouse name: Not on file   Number of children: Not on file   Years of education: Not on file   Highest education level: Not on file  Occupational History   Not on file  Tobacco Use   Smoking status: Never Smoker   Smokeless tobacco: Never Used  Substance and Sexual Activity   Alcohol use: No    Alcohol/week: 0.0 standard drinks   Drug use: No   Sexual activity: Yes  Other Topics Concern    Not on file  Social History Narrative   Not on file   Social Determinants of Health   Financial Resource Strain:    Difficulty of Paying Living Expenses: Not on file  Food Insecurity:    Worried About Charity fundraiser in the Last Year: Not on file   [employer] of Food in the Last Year: Not on file  Transportation Needs:    Lack of Transportation (Medical): Not on file   Lack of Transportation (Non-Medical): Not on file  Physical Activity:    Days of Exercise per Week: Not on file   Minutes of Exercise per Session: Not on file  Stress:    Feeling of Stress : Not on file  Social Connections:    Frequency of Communication with Friends and Family: Not on file   Frequency of Social Gatherings with Friends and Family: Not on file   Attends Religious Services: Not on file   Active Member of Clubs or Organizations: Not on file   Attends Archivist Meetings: Not on file   Marital Status: Not on file  Intimate Partner Violence:    Fear of Current or Ex-Partner: Not on file   Emotionally Abused: Not on file  Physically Abused: Not on file   Sexually Abused: Not on file    Outpatient Medications Prior to Visit  Medication Sig Dispense Refill   atorvastatin (LIPITOR) 80 MG tablet Take 0.5 tablets (40 mg total) by mouth daily. 90 tablet 3   FEROSUL 325 (65 Fe) MG tablet Take 325 mg by mouth daily.     furosemide (LASIX) 40 MG tablet Take 1 tablet (40 mg total) by mouth daily. 90 tablet 1   hydrALAZINE (APRESOLINE) 50 MG tablet Take 1 tablet (50 mg total) by mouth 3 (three) times daily. 270 tablet 3   labetalol (NORMODYNE) 300 MG tablet Take 1 tablet (300 mg total) by mouth 2 (two) times daily. 180 tablet 3   albuterol (VENTOLIN HFA) 108 (90 Base) MCG/ACT inhaler Inhale 2 puffs into the lungs every 4 (four) hours as needed for wheezing or shortness of breath. 18 g 0   amLODipine (NORVASC) 5 MG tablet Take 1 tablet (5 mg total) by mouth daily. 90 tablet 3    Multiple Vitamin (MULTIVITAMIN) tablet Take 1 tablet by mouth daily.     No facility-administered medications prior to visit.    No Known Allergies  Review of Systems  Constitutional: Negative.   HENT: Negative.   Eyes: Negative.   Respiratory: Negative.   Cardiovascular: Negative.   Gastrointestinal: Negative.   Genitourinary: Positive for decreased urine volume, difficulty urinating and dysuria.  Musculoskeletal: Negative.   Skin: Negative.   Neurological: Negative.   Psychiatric/Behavioral: Negative.        Objective:    Physical Exam Vitals and nursing note reviewed.  Constitutional:      General: He is not in acute distress.    Appearance: Normal appearance. He is obese. He is not ill-appearing, toxic-appearing or diaphoretic.  Cardiovascular:     Rate and Rhythm: Normal rate and regular rhythm.  Abdominal:     Tenderness: There is no right CVA tenderness or left CVA tenderness.  Skin:    General: Skin is warm and dry.  Neurological:     General: No focal deficit present.     Mental Status: He is alert and oriented to person, place, and time. Mental status is at baseline.  Psychiatric:        Mood and Affect: Mood normal.        Behavior: Behavior normal.        Thought Content: Thought content normal.        Judgment: Judgment normal.     BP (!) 111/56    Pulse 93    Temp (!) 97.2 F (36.2 C) (Temporal)    Resp 18    Ht 5\' 10"  (1.778 m)    Wt (!) 327 lb 9.6 oz (148.6 kg)    SpO2 96%    BMI 47.01 kg/m  Wt Readings from Last 3 Encounters:  11/17/19 (!) 301 lb (136.5 kg)  11/01/19 (!) 315 lb (142.9 kg)  10/11/19 (!) 317 lb (143.8 kg)    Health Maintenance Due  Topic Date Due   COVID-19 Vaccine (2 - Pfizer 2-dose series) 09/16/2019   INFLUENZA VACCINE  10/02/2019    There are no preventive care reminders to display for this patient.   Lab Results  Component Value Date   TSH 3.280 08/16/2019   Lab Results  Component Value Date   WBC 6.3  09/13/2019   HGB 11.1 (L) 11/18/2019   HCT 28.8 (L) 09/13/2019   MCV 90 09/13/2019   PLT 226 08/16/2019  Lab Results  Component Value Date   NA 145 (H) 09/13/2019   K 5.1 09/13/2019   CO2 21 09/13/2019   GLUCOSE 104 (H) 09/13/2019   BUN 45 (H) 09/13/2019   CREATININE 5.49 (HH) 09/13/2019   BILITOT <0.2 09/13/2019   ALKPHOS 59 09/13/2019   AST 12 09/13/2019   ALT 18 09/13/2019   PROT 5.0 (L) 09/13/2019   ALBUMIN 2.9 (L) 09/13/2019   CALCIUM 8.2 (L) 09/13/2019   Lab Results  Component Value Date   CHOL 162 08/16/2019   Lab Results  Component Value Date   HDL 54 08/16/2019   Lab Results  Component Value Date   LDLCALC 93 08/16/2019   Lab Results  Component Value Date   TRIG 78 08/16/2019   Lab Results  Component Value Date   CHOLHDL 3.0 08/16/2019   Lab Results  Component Value Date   HGBA1C 5.4 08/16/2019       Assessment & Plan:   Problem List Items Addressed This Visit    None    Visit Diagnoses    Urine abnormality    -  Primary   Relevant Orders   POCT urinalysis dipstick (Completed)   Comprehensive metabolic panel (Completed)   CBC With Differential (Completed)   Urine Culture (Completed)   Dysuria       Relevant Orders   Comprehensive metabolic panel (Completed)   CBC With Differential (Completed)   Urine Culture (Completed)       No orders of the defined types were placed in this encounter.  PLAN  poct ua unremarkable. Will send culture  Bigger concern is for worsening CKD. Encourage close follow up with nephrology. Will draw cbc and cmp to monitor  Patient encouraged to call clinic with any questions, comments, or concerns.  Maximiano Coss, NP

## 2019-12-02 ENCOUNTER — Other Ambulatory Visit: Payer: Self-pay

## 2019-12-02 ENCOUNTER — Encounter (HOSPITAL_COMMUNITY)
Admission: RE | Admit: 2019-12-02 | Discharge: 2019-12-02 | Disposition: A | Discharge status: Home / Self Care | Payer: Federal, State, Local not specified - PPO | Source: Ambulatory Visit | Attending: Nephrology | Admitting: Nephrology

## 2019-12-02 VITALS — BP 150/92 | HR 86 | Temp 97.6°F | Resp 18

## 2019-12-02 DIAGNOSIS — N184 Chronic kidney disease, stage 4 (severe): Secondary | ICD-10-CM | POA: Diagnosis not present

## 2019-12-02 DIAGNOSIS — D631 Anemia in chronic kidney disease: Secondary | ICD-10-CM

## 2019-12-02 MED ORDER — EPOETIN ALFA-EPBX 10000 UNIT/ML IJ SOLN
INTRAMUSCULAR | Status: AC
  Filled 2019-12-02: qty 1

## 2019-12-02 MED ORDER — EPOETIN ALFA-EPBX 10000 UNIT/ML IJ SOLN
20000.0000 [IU] | INTRAMUSCULAR | Status: DC
  Administered 2019-12-02: 20000 [IU] via SUBCUTANEOUS

## 2019-12-05 LAB — POCT HEMOGLOBIN-HEMACUE: Hemoglobin: 10.8 g/dL — ABNORMAL LOW (ref 13.0–17.0)

## 2019-12-08 ENCOUNTER — Encounter (HOSPITAL_BASED_OUTPATIENT_CLINIC_OR_DEPARTMENT_OTHER): Payer: Federal, State, Local not specified - PPO | Admitting: Cardiovascular Disease

## 2019-12-12 ENCOUNTER — Ambulatory Visit (INDEPENDENT_AMBULATORY_CARE_PROVIDER_SITE_OTHER): Payer: Federal, State, Local not specified - PPO | Admitting: Family Medicine

## 2019-12-12 ENCOUNTER — Other Ambulatory Visit: Payer: Self-pay

## 2019-12-12 ENCOUNTER — Encounter (INDEPENDENT_AMBULATORY_CARE_PROVIDER_SITE_OTHER): Payer: Self-pay | Admitting: Family Medicine

## 2019-12-12 VITALS — BP 139/80 | HR 89 | Temp 98.0°F | Ht 69.0 in | Wt 295.0 lb

## 2019-12-12 DIAGNOSIS — E8881 Metabolic syndrome: Secondary | ICD-10-CM

## 2019-12-12 DIAGNOSIS — R112 Nausea with vomiting, unspecified: Secondary | ICD-10-CM

## 2019-12-12 DIAGNOSIS — E66813 Obesity, class 3: Secondary | ICD-10-CM

## 2019-12-12 DIAGNOSIS — N184 Chronic kidney disease, stage 4 (severe): Secondary | ICD-10-CM | POA: Diagnosis not present

## 2019-12-12 DIAGNOSIS — Z6841 Body Mass Index (BMI) 40.0 and over, adult: Secondary | ICD-10-CM

## 2019-12-13 LAB — COMPREHENSIVE METABOLIC PANEL
ALT: 13 IU/L (ref 0–44)
AST: 10 IU/L (ref 0–40)
Albumin/Globulin Ratio: 1.3 (ref 1.2–2.2)
Albumin: 2.9 g/dL — ABNORMAL LOW (ref 4.0–5.0)
Alkaline Phosphatase: 60 IU/L (ref 44–121)
BUN/Creatinine Ratio: 9 (ref 9–20)
BUN: 59 mg/dL — ABNORMAL HIGH (ref 6–24)
Bilirubin Total: <0.2 mg/dL (ref 0.0–1.2)
CO2: 17 mmol/L — ABNORMAL LOW (ref 20–29)
Calcium: 7.9 mg/dL — ABNORMAL LOW (ref 8.7–10.2)
Chloride: 115 mmol/L — ABNORMAL HIGH (ref 96–106)
Creatinine, Ser: 6.31 mg/dL — ABNORMAL HIGH (ref 0.76–1.27)
GFR calc Af Amer: 11 mL/min/{1.73_m2} — ABNORMAL LOW (ref >59)
GFR calc non Af Amer: 10 mL/min/{1.73_m2} — ABNORMAL LOW (ref >59)
Globulin, Total: 2.3 g/dL (ref 1.5–4.5)
Glucose: 133 mg/dL — ABNORMAL HIGH (ref 65–99)
Potassium: 4.9 mmol/L (ref 3.5–5.2)
Sodium: 143 mmol/L (ref 134–144)
Total Protein: 5.2 g/dL — ABNORMAL LOW (ref 6.0–8.5)

## 2019-12-13 LAB — CBC WITH DIFFERENTIAL/PLATELET
Basophils Absolute: 0 10*3/uL (ref 0.0–0.2)
Basos: 1 %
EOS (ABSOLUTE): 0.2 10*3/uL (ref 0.0–0.4)
Eos: 7 %
Hematocrit: 32.8 % — ABNORMAL LOW (ref 37.5–51.0)
Hemoglobin: 10.3 g/dL — ABNORMAL LOW (ref 13.0–17.7)
Immature Grans (Abs): 0 10*3/uL (ref 0.0–0.1)
Immature Granulocytes: 0 %
Lymphocytes Absolute: 0.6 10*3/uL — ABNORMAL LOW (ref 0.7–3.1)
Lymphs: 19 %
MCH: 28.8 pg (ref 26.6–33.0)
MCHC: 31.4 g/dL — ABNORMAL LOW (ref 31.5–35.7)
MCV: 92 fL (ref 79–97)
Monocytes Absolute: 0.2 10*3/uL (ref 0.1–0.9)
Monocytes: 7 %
Neutrophils Absolute: 2 10*3/uL (ref 1.4–7.0)
Neutrophils: 66 %
Platelets: 211 10*3/uL (ref 150–450)
RBC: 3.58 x10E6/uL — ABNORMAL LOW (ref 4.14–5.80)
RDW: 14.8 % (ref 11.6–15.4)
WBC: 3.1 10*3/uL — ABNORMAL LOW (ref 3.4–10.8)

## 2019-12-13 LAB — MAGNESIUM: Magnesium: 2.2 mg/dL (ref 1.6–2.3)

## 2019-12-13 LAB — PHOSPHORUS: Phosphorus: 5.4 mg/dL — ABNORMAL HIGH (ref 2.8–4.1)

## 2019-12-13 NOTE — Progress Notes (Signed)
Chief Complaint:   OBESITY Hector Brooks is here to discuss his progress with his obesity treatment plan along with follow-up of his obesity related diagnoses. Hector Brooks is on the Category 2 Plan and states he is following his eating plan approximately 100% of the time. Hector Brooks states he is walking for 30 minutes 3-4 times per week.  Today's visit was #: 4 Starting weight: 317 lbs Starting date: 10/11/2019 Today's weight: 295 lbs Today's date: 12/12/2019 Total lbs lost to date: 22 lbs Total lbs lost since last in-office visit: 6 lbs Total weight loss percentage to date: -6.94%  Interim History: Hector Brooks is down 22 pounds today.  He has a sleep study scheduled in a few weeks.  He says he has been paying attention to his body cues.  He will have a port placed in early November.   Assessment/Plan:   1. Stage 4 chronic kidney disease ([place]) Followed by Dr. Carolin Sicks.  Assessment: CKD stage 4 - GFR 15-29.  Goal: Stable to improved.  Due to membranous glomerulonephritis, HTN, in early November he will have a port placed for possible HD.  Plan: Continue to monitor BMP and avoid nephrotoxic agents. Diet: Avoid buying foods that are: processed, frozen, or prepackaged to avoid excess salt.  Overall goal is a kidney transplant.  Lab Results  Component Value Date   CREATININE 6.31 (H) 12/12/2019   CREATININE 5.49 (HH) 09/13/2019   CREATININE 4.70 (HH) 08/16/2019   Lab Results  Component Value Date   CREATININE 6.31 (H) 12/12/2019   BUN 59 (H) 12/12/2019   NA 143 12/12/2019   K 4.9 12/12/2019   CL 115 (H) 12/12/2019   CO2 17 (L) 12/12/2019   2. Non-intractable vomiting with nausea In the office this morning.  No fever.  No known sick contacts.  He did not eat this morning, but he took his supplement and chased it with mustard. Plan:  Will check labs today.  - CBC with Differential/Platelet - Comprehensive metabolic panel - Magnesium - Phosphorus  3. Metabolic syndrome Starting goal:  Lose 7-10% of starting weight. He will continue to focus on protein-rich, low simple carbohydrate foods. We reviewed the importance of hydration, regular exercise for stress reduction, and restorative sleep.  We will continue to check lab work every 3 months, with 10% weight loss, or should any other concerns arise.  4. Class 3 severe obesity with serious comorbidity and body mass index (BMI) of 40.0 to 44.9 in adult, unspecified obesity type (HCC)  Hector Brooks is currently in the action stage of change. As such, his goal is to continue with weight loss efforts. He has agreed to the Category 2 Plan.   Exercise goals: For substantial health benefits, adults should do at least 150 minutes (2 hours and 30 minutes) a week of moderate-intensity, or 75 minutes (1 hour and 15 minutes) a week of vigorous-intensity aerobic physical activity, or an equivalent combination of moderate- and vigorous-intensity aerobic activity. Aerobic activity should be performed in episodes of at least 10 minutes, and preferably, it should be spread throughout the week.  Behavioral modification strategies: decreasing simple carbohydrates, increasing vegetables, decreasing sodium intake, increasing high fiber foods and emotional eating strategies.  Hector Brooks has agreed to follow-up with our clinic in 3 weeks. He was informed of the importance of frequent follow-up visits to maximize his success with intensive lifestyle modifications for his multiple health conditions.   Hector Brooks was informed we would discuss his lab results at his next visit unless there is  a critical issue that needs to be addressed sooner. Hector Brooks agreed to keep his next visit at the agreed upon time to discuss these results.  Objective:   Blood pressure 139/80, pulse 89, temperature 98 F (36.7 C), temperature source Oral, height 5\' 9"  (1.753 m), weight 295 lb (133.8 kg), SpO2 96 %. Body mass index is 43.56 kg/m.  General: Cooperative, alert, well developed, in  no acute distress. HEENT: Conjunctivae and lids unremarkable. Cardiovascular: Regular rhythm.  Lungs: Normal work of breathing. Neurologic: No focal deficits.   Lab Results  Component Value Date   CREATININE 6.31 (H) 12/12/2019   BUN 59 (H) 12/12/2019   NA 143 12/12/2019   K 4.9 12/12/2019   CL 115 (H) 12/12/2019   CO2 17 (L) 12/12/2019   Lab Results  Component Value Date   ALT 13 12/12/2019   AST 10 12/12/2019   ALKPHOS 60 12/12/2019   BILITOT <0.2 12/12/2019   Lab Results  Component Value Date   HGBA1C 5.4 08/16/2019   HGBA1C 5.9 (H) 01/04/2019   HGBA1C 5.9 07/02/2017   Lab Results  Component Value Date   TSH 3.280 08/16/2019   Lab Results  Component Value Date   CHOL 162 08/16/2019   HDL 54 08/16/2019   LDLCALC 93 08/16/2019   TRIG 78 08/16/2019   CHOLHDL 3.0 08/16/2019   Lab Results  Component Value Date   WBC 3.1 (L) 12/12/2019   HGB 10.3 (L) 12/12/2019   HCT 32.8 (L) 12/12/2019   MCV 92 12/12/2019   PLT 211 12/12/2019   Lab Results  Component Value Date   IRON 70 11/18/2019   TIBC 245 (L) 11/18/2019   FERRITIN 130 11/18/2019   Attestation Statements:   Reviewed by clinician on day of visit: allergies, medications, problem list, medical history, surgical history, family history, social history, and previous encounter notes.  I, Water quality scientist, CMA, am acting as transcriptionist for Briscoe Deutscher, DO  I have reviewed the above documentation for accuracy and completeness, and I agree with the above. Briscoe Deutscher, DO

## 2019-12-16 ENCOUNTER — Encounter (HOSPITAL_COMMUNITY)
Admission: RE | Admit: 2019-12-16 | Discharge: 2019-12-16 | Disposition: A | Discharge status: Home / Self Care | Payer: Federal, State, Local not specified - PPO | Source: Ambulatory Visit | Attending: Nephrology | Admitting: Nephrology

## 2019-12-16 ENCOUNTER — Other Ambulatory Visit: Payer: Self-pay

## 2019-12-16 VITALS — BP 136/88 | HR 81 | Temp 97.7°F | Resp 18

## 2019-12-16 DIAGNOSIS — N184 Chronic kidney disease, stage 4 (severe): Secondary | ICD-10-CM | POA: Diagnosis not present

## 2019-12-16 DIAGNOSIS — D631 Anemia in chronic kidney disease: Secondary | ICD-10-CM | POA: Diagnosis not present

## 2019-12-16 LAB — IRON AND TIBC
Iron: 68 ug/dL (ref 45–182)
Saturation Ratios: 32 % (ref 17.9–39.5)
TIBC: 213 ug/dL — ABNORMAL LOW (ref 250–450)
UIBC: 145 ug/dL

## 2019-12-16 LAB — FERRITIN: Ferritin: 116 ng/mL (ref 24–336)

## 2019-12-16 LAB — POCT HEMOGLOBIN-HEMACUE: Hemoglobin: 10.2 g/dL — ABNORMAL LOW (ref 13.0–17.0)

## 2019-12-16 MED ORDER — EPOETIN ALFA-EPBX 10000 UNIT/ML IJ SOLN: 20000.0000 [IU] | INTRAMUSCULAR | Status: DC

## 2019-12-16 MED ORDER — EPOETIN ALFA-EPBX 10000 UNIT/ML IJ SOLN
INTRAMUSCULAR | Status: AC
  Administered 2019-12-16: 20000 [IU] via SUBCUTANEOUS
  Filled 2019-12-16: qty 2

## 2019-12-23 ENCOUNTER — Other Ambulatory Visit: Payer: Self-pay

## 2019-12-23 DIAGNOSIS — N184 Chronic kidney disease, stage 4 (severe): Secondary | ICD-10-CM

## 2019-12-23 DIAGNOSIS — N189 Chronic kidney disease, unspecified: Secondary | ICD-10-CM

## 2019-12-30 ENCOUNTER — Other Ambulatory Visit: Payer: Self-pay

## 2019-12-30 ENCOUNTER — Encounter (HOSPITAL_COMMUNITY)
Admission: RE | Admit: 2019-12-30 | Discharge: 2019-12-30 | Disposition: A | Discharge status: Home / Self Care | Payer: Federal, State, Local not specified - PPO | Source: Ambulatory Visit | Attending: Nephrology | Admitting: Nephrology

## 2019-12-30 VITALS — BP 141/90 | HR 85 | Temp 98.1°F | Resp 18

## 2019-12-30 DIAGNOSIS — D631 Anemia in chronic kidney disease: Secondary | ICD-10-CM | POA: Diagnosis not present

## 2019-12-30 DIAGNOSIS — N184 Chronic kidney disease, stage 4 (severe): Secondary | ICD-10-CM | POA: Diagnosis not present

## 2019-12-30 LAB — POCT HEMOGLOBIN-HEMACUE: Hemoglobin: 10.6 g/dL — ABNORMAL LOW (ref 13.0–17.0)

## 2019-12-30 MED ORDER — EPOETIN ALFA-EPBX 10000 UNIT/ML IJ SOLN: 20000.0000 [IU] | INTRAMUSCULAR | Status: DC

## 2019-12-30 MED ORDER — EPOETIN ALFA-EPBX 10000 UNIT/ML IJ SOLN
INTRAMUSCULAR | Status: AC
  Administered 2019-12-30: 20000 [IU] via SUBCUTANEOUS
  Filled 2019-12-30: qty 2

## 2020-01-02 ENCOUNTER — Ambulatory Visit (INDEPENDENT_AMBULATORY_CARE_PROVIDER_SITE_OTHER): Payer: Federal, State, Local not specified - PPO | Admitting: Family Medicine

## 2020-01-02 ENCOUNTER — Encounter (INDEPENDENT_AMBULATORY_CARE_PROVIDER_SITE_OTHER): Payer: Self-pay | Admitting: Family Medicine

## 2020-01-02 ENCOUNTER — Other Ambulatory Visit: Payer: Self-pay

## 2020-01-02 VITALS — BP 153/82 | HR 91 | Temp 97.8°F | Ht 69.0 in | Wt 293.0 lb

## 2020-01-02 DIAGNOSIS — E8881 Metabolic syndrome: Secondary | ICD-10-CM | POA: Diagnosis not present

## 2020-01-02 DIAGNOSIS — N184 Chronic kidney disease, stage 4 (severe): Secondary | ICD-10-CM

## 2020-01-02 DIAGNOSIS — I129 Hypertensive chronic kidney disease with stage 1 through stage 4 chronic kidney disease, or unspecified chronic kidney disease: Secondary | ICD-10-CM | POA: Diagnosis not present

## 2020-01-02 DIAGNOSIS — N189 Chronic kidney disease, unspecified: Secondary | ICD-10-CM

## 2020-01-02 DIAGNOSIS — Z6841 Body Mass Index (BMI) 40.0 and over, adult: Secondary | ICD-10-CM

## 2020-01-02 HISTORY — DX: Chronic kidney disease, unspecified: N18.9

## 2020-01-02 NOTE — Patient Instructions (Signed)
Kidney-friendly meal planning involves: balancing needed macronutrients, maintaining a safe calorie deficit, and limiting fluids and certain minerals (sodium, potassium, and phosphorous).   Resources: My favorite website is: [employer]. You can download free kidney-friendly cookbooks, along with other great resources.   Other free resources: Kidney Cooking: A Family Friendly Recipe Book for Kidney Patients National Kidney Diet: Dish Up a Dialysis-Friendly [employer] Kidney Diet: Dish Up a Kidney-Friendly Meal  Tips for Limiting sodium, potassium, and phosphorous Choose:  . Foods with 200 mg or less sodium per serving . Frozen or packaged meals with 600 mg or less sodium per serving . Foods that do not list "potassium chloride" ingredients . Lower sodium condiments, such as horseradish and yellow mustard Limit: . Any foods with added phosphorous (any words with "phos," such as calcium phosphate, in the ingredients) . Pickled foods, such as olives, sauerkraut, pickles, and kimchi . Soy sauce, barbeque sauce, ketchup, teriyaki sauce, salsa, tomato sauce or paste, and other high-sodium sauces . Salt added to foods when cooking or at the table . Canned soups or soup mixes, packaged foods, and processed foods (such as box mixes, fast food, frozen meals, gas station foods, processed meats and cheeses, vending machine foods, and other convenience foods Tips: . Eat home-cooked meals made from fresh ingredients . Use no salt added stocks or broths instead of regular soups, canned soups, or bouillon

## 2020-01-03 NOTE — Progress Notes (Signed)
Chief Complaint:   OBESITY Hector Brooks is here to discuss his progress with his obesity treatment plan along with follow-up of his obesity related diagnoses.   Today's visit was #: 5 Starting weight: 317 lbs Starting date: 10/11/2019 Today's weight: 293 lbs Today's date: 01/02/2020 Total lbs lost to date: 24 lbs Body mass index is 43.27 kg/m.  Total weight loss percentage to date: -7.57%  Interim History: Hector Brooks says he went to the [employer] last Saturday.  He walked a lot and ate junk food.  He will see Nephrology at the end of the month.  He is scheduled for a port placement next week.  Nutrition Plan: the Category 2 Plan for 50% of the time.  Hunger is moderately controlled controlled. Cravings are moderately controlled controlled.  Activity: Cardio for 30-45 minutes 2-3 times per week. Sleep: Sleep is restful.   Assessment/Plan:   1. Metabolic syndrome Improving. Starting goal: Lose 7-10% of starting weight. He will continue to focus on protein-rich, low simple carbohydrate foods. We reviewed the importance of hydration, regular exercise for stress reduction, and restorative sleep.  We will continue to check lab work every 3 months, with 10% weight loss, or should any other concerns arise.  2. Stage 4 chronic kidney disease ([place]) Followed by Dr. Carolin Sicks.  Assessment: CKD stage 4 - GFR 15-29.  Due to membranous glomerulonephritis, HTN, in early November he will have a port placed for possible HD.  Plan: Continue to monitor BMP and avoid nephrotoxic agents. Diet: Avoid buying foods that are: processed, frozen, or prepackaged to avoid excess salt.  Overall goal is a kidney transplant.  Lab Results  Component Value Date   CREATININE 6.31 (H) 12/12/2019   CREATININE 5.49 (HH) 09/13/2019   CREATININE 4.70 (HH) 08/16/2019   Lab Results  Component Value Date   CREATININE 6.31 (H) 12/12/2019   BUN 59 (H) 12/12/2019   NA 143 12/12/2019   K 4.9 12/12/2019   CL 115 (H)  12/12/2019   CO2 17 (L) 12/12/2019   3. Hypertension, renal disease, stage 1-4 or unspecified chronic kidney disease Stable.  Medications: Norvasc and labetalol. Plan: Avoid buying foods that are: processed, frozen, or prepackaged to avoid excess salt. We will continue to monitor symptoms as they relate to his weight loss journey.  BP Readings from Last 3 Encounters:  01/02/20 (!) 153/82  12/30/19 (!) 141/90  12/16/19 136/88   Lab Results  Component Value Date   CREATININE 6.31 (H) 12/12/2019   4. Class 3 severe obesity with serious comorbidity and body mass index (BMI) of 40.0 to 44.9 in adult, unspecified obesity type (HCC)  Hector Brooks is currently in the action stage of change. As such, his goal is to continue with weight loss efforts.   Nutrition goals: He has agreed to the Category 2 Plan.   Exercise goals: For substantial health benefits, adults should do at least 150 minutes (2 hours and 30 minutes) a week of moderate-intensity, or 75 minutes (1 hour and 15 minutes) a week of vigorous-intensity aerobic physical activity, or an equivalent combination of moderate- and vigorous-intensity aerobic activity. Aerobic activity should be performed in episodes of at least 10 minutes, and preferably, it should be spread throughout the week.  Behavioral modification strategies: increasing water intake, decreasing sodium intake and celebration eating strategies.  Hector Brooks has agreed to follow-up with our clinic in 3 weeks. He was informed of the importance of frequent follow-up visits to maximize his success with intensive lifestyle modifications  for his multiple health conditions.   Objective:   Blood pressure (!) 153/82, pulse 91, temperature 97.8 F (36.6 C), temperature source Oral, height 5\' 9"  (1.753 m), weight 293 lb (132.9 kg), SpO2 98 %. Body mass index is 43.27 kg/m.  General: Cooperative, alert, well developed, in no acute distress. HEENT: Conjunctivae and lids  unremarkable. Cardiovascular: Regular rhythm.  Lungs: Normal work of breathing. Neurologic: No focal deficits.   Lab Results  Component Value Date   CREATININE 6.31 (H) 12/12/2019   BUN 59 (H) 12/12/2019   NA 143 12/12/2019   K 4.9 12/12/2019   CL 115 (H) 12/12/2019   CO2 17 (L) 12/12/2019   Lab Results  Component Value Date   ALT 13 12/12/2019   AST 10 12/12/2019   ALKPHOS 60 12/12/2019   BILITOT <0.2 12/12/2019   Lab Results  Component Value Date   HGBA1C 5.4 08/16/2019   HGBA1C 5.9 (H) 01/04/2019   HGBA1C 5.9 07/02/2017   Lab Results  Component Value Date   TSH 3.280 08/16/2019   Lab Results  Component Value Date   CHOL 162 08/16/2019   HDL 54 08/16/2019   LDLCALC 93 08/16/2019   TRIG 78 08/16/2019   CHOLHDL 3.0 08/16/2019   Lab Results  Component Value Date   WBC 3.1 (L) 12/12/2019   HGB 10.6 (L) 12/30/2019   HCT 32.8 (L) 12/12/2019   MCV 92 12/12/2019   PLT 211 12/12/2019   Lab Results  Component Value Date   IRON 68 12/16/2019   TIBC 213 (L) 12/16/2019   FERRITIN 116 12/16/2019   Attestation Statements:   Reviewed by clinician on day of visit: allergies, medications, problem list, medical history, surgical history, family history, social history, and previous encounter notes.  Time spent on visit including pre-visit chart review and post-visit care and charting was 30 minutes.   I, Water quality scientist, CMA, am acting as transcriptionist for Briscoe Deutscher, DO  I have reviewed the above documentation for accuracy and completeness, and I agree with the above. Briscoe Deutscher, DO

## 2020-01-09 ENCOUNTER — Ambulatory Visit (INDEPENDENT_AMBULATORY_CARE_PROVIDER_SITE_OTHER)
Admission: RE | Admit: 2020-01-09 | Discharge: 2020-01-09 | Disposition: A | Discharge status: Home / Self Care | Payer: Federal, State, Local not specified - PPO | Source: Ambulatory Visit | Attending: Surgery | Admitting: Surgery

## 2020-01-09 ENCOUNTER — Ambulatory Visit (HOSPITAL_COMMUNITY)
Admission: RE | Admit: 2020-01-09 | Discharge: 2020-01-09 | Disposition: A | Discharge status: Home / Self Care | Payer: Federal, State, Local not specified - PPO | Source: Ambulatory Visit | Attending: Surgery | Admitting: Surgery

## 2020-01-09 ENCOUNTER — Encounter: Payer: Self-pay | Admitting: Surgery

## 2020-01-09 ENCOUNTER — Other Ambulatory Visit: Payer: Self-pay

## 2020-01-09 ENCOUNTER — Ambulatory Visit: Payer: Federal, State, Local not specified - PPO | Admitting: Surgery

## 2020-01-09 VITALS — BP 161/114 | HR 78 | Temp 98.3°F | Resp 20 | Ht 69.0 in | Wt 301.0 lb

## 2020-01-09 DIAGNOSIS — N184 Chronic kidney disease, stage 4 (severe): Secondary | ICD-10-CM | POA: Diagnosis not present

## 2020-01-09 DIAGNOSIS — N189 Chronic kidney disease, unspecified: Secondary | ICD-10-CM

## 2020-01-09 NOTE — Progress Notes (Signed)
[facility] [place]  Patient name: Hector Brooks MRN: [id number] DOB: May 27, 1970 Sex: male   REQUESTING PROVIDER:    Dr. Carolin Sicks   REASON FOR CONSULT:    Dialysis access  HISTORY OF PRESENT ILLNESS:   Hector Brooks is a 49 y.o. male, who is referred for dialysis access.  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  He is right-handed.  He has not had a pacemaker or defibrillator.  He has a history of provoked DVT in the right leg.  He is no longer on anticoagulation.  He is medically managed for hypertension.  He is a non-smoker.  PAST MEDICAL HISTORY    Past Medical History:  Diagnosis Date  . Anemia   . Chronic kidney disease   . 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 History of torn meniscus of right knee   . HLD (hyperlipidemia)   . HTN (hypertension)   . Hx of blood clots   . Lower extremity edema   . 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 Sleep apnea   . Vitamin D deficiency      FAMILY HISTORY   Family History  Problem Relation Age of Onset  . Cancer Father   . Diabetes Father   . Drug abuse Father   . Hyperlipidemia Sister   . Diabetes Maternal Grandfather   . Heart disease Maternal Grandfather     SOCIAL HISTORY:   Social History   Socioeconomic History  . Marital status: Married    Spouse name: Not on file  . Number of children: Not on file  . Years of education: Not on file  . Highest education level: Not on file  Occupational History  . Not on file  Tobacco Use  . Smoking status: Never Smoker  . Smokeless tobacco: Never Used  Vaping Use  . Vaping Use: Never used  Substance and Sexual Activity  . Alcohol use: No    Alcohol/week: 0.0 standard drinks  . Drug use: No  . Sexual activity: Yes  Other Topics Concern  . Not on file  Social History Narrative  . Not on file   Social Determinants of Health    Financial Resource Strain:   . Difficulty of Paying Living Expenses: Not on file  Food Insecurity:   . Worried About Charity fundraiser in the Last Year: Not on file  . Ran Out of Food in the Last Year: Not on file  Transportation Needs:   . Lack of Transportation (Medical): Not on file  . Lack of Transportation (Non-Medical): Not on file  Physical Activity:   . Days of Exercise per Week: Not on file  . Minutes of Exercise per Session: Not on file  Stress:   . Feeling of Stress : Not on file  Social Connections:   . Frequency of Communication with Friends and Family: Not on file  . Frequency of Social Gatherings with Friends and Family: Not on file  . Attends Religious Services: Not on file  . Active Member of Clubs or Organizations: Not on file  . Attends Archivist Meetings: Not on file  . Marital Status: Not on file  Intimate Partner Violence:   . Fear of Current or Ex-Partner: Not on file  . Emotionally Abused: Not on file  . Physically Abused: Not on file  . Sexually Abused: Not on file    ALLERGIES:    No Known Allergies  CURRENT  MEDICATIONS:    Current Outpatient Medications  Medication Sig Dispense Refill  . amLODipine (NORVASC) 10 MG tablet Take 10 mg by mouth daily.    Marland Kitchen atorvastatin (LIPITOR) 80 MG tablet Take 0.5 tablets (40 mg total) by mouth daily. 90 tablet 3  . calcitRIOL (ROCALTROL) 0.25 MCG capsule Take 0.25 mcg by mouth daily.    . FEROSUL 325 (65 Fe) MG tablet Take 325 mg by mouth daily.    . furosemide (LASIX) 40 MG tablet Take 1 tablet (40 mg total) by mouth daily. 90 tablet 1  . hydrALAZINE (APRESOLINE) 50 MG tablet Take 1 tablet (50 mg total) by mouth 3 (three) times daily. 270 tablet 3  . labetalol (NORMODYNE) 300 MG tablet Take 1 tablet (300 mg total) by mouth 2 (two) times daily. 180 tablet 3   No current facility-administered medications for this visit.    REVIEW OF SYSTEMS:   [X]  denotes positive finding, [ ]  denotes  negative finding Cardiac  Comments:  Chest pain or chest pressure:    Shortness of breath upon exertion:    Short of breath when lying flat:    Irregular heart rhythm:        Vascular    Pain in calf, thigh, or hip brought on by ambulation:    Pain in feet at night that wakes you up from your sleep:     Blood clot in your veins:    Leg swelling:  x       Pulmonary    Oxygen at home:    Productive cough:     Wheezing:         Neurologic    Sudden weakness in arms or legs:     Sudden numbness in arms or legs:     Sudden onset of difficulty speaking or slurred speech:    Temporary loss of vision in one eye:     Problems with dizziness:         Gastrointestinal    Blood in stool:      Vomited blood:         Genitourinary    Burning when urinating:     Blood in urine:        Psychiatric    Major depression:         Hematologic    Bleeding problems:    Problems with blood clotting too easily:        Skin    Rashes or ulcers:        Constitutional    Fever or chills:     PHYSICAL EXAM:   Vitals:   01/09/20 1118  BP: (!) 161/114  Pulse: 78  Resp: 20  Temp: 98.3 F (36.8 C)  SpO2: 98%  Weight: (!) 301 lb (136.5 kg)  Height: 5\' 9"  (1.753 m)    GENERAL: The patient is a well-nourished male, in no acute distress. The vital signs are documented above. CARDIAC: There is a regular rate and rhythm.  VASCULAR: Palpable left brachial and radial pulse PULMONARY: Nonlabored respirations MUSCULOSKELETAL: There are no major deformities or cyanosis. NEUROLOGIC: No focal weakness or paresthesias are detected. SKIN: There are no ulcers or rashes noted. PSYCHIATRIC: The patient has a normal affect.  STUDIES:   I have reviewed the following: Arterial: Right: No obstruction visualized in the right upper extremity.  Left: No obstruction visualized in the left upper extremity.   +-----------------+-------------+----------+--------------+  Right Cephalic  Diameter  (cm)Depth (cm)  Findings    +-----------------+-------------+----------+--------------+  Shoulder       0.38                 +-----------------+-------------+----------+--------------+  Mid upper arm    0.27          tortuous    +-----------------+-------------+----------+--------------+  Dist upper arm              not visualized  +-----------------+-------------+----------+--------------+  Antecubital fossa  0.31                 +-----------------+-------------+----------+--------------+  Prox forearm     0.26                 +-----------------+-------------+----------+--------------+  Mid forearm     0.33                 +-----------------+-------------+----------+--------------+  Wrist        0.31                 +-----------------+-------------+----------+--------------+   +-----------------+-------------+----------+---------+  Right Basilic  Diameter (cm)Depth (cm)Findings   +-----------------+-------------+----------+---------+  Mid upper arm    0.49               +-----------------+-------------+----------+---------+  Dist upper arm    0.46        branching  +-----------------+-------------+----------+---------+  Antecubital fossa  0.49               +-----------------+-------------+----------+---------+  Prox forearm     0.51               +-----------------+-------------+----------+---------+   +-----------------+-------------+----------+---------+  Left Cephalic  Diameter (cm)Depth (cm)Findings   +-----------------+-------------+----------+---------+  Shoulder       0.42               +-----------------+-------------+----------+---------+  Mid upper arm    0.44        tortuous    +-----------------+-------------+----------+---------+  Dist upper arm    0.27               +-----------------+-------------+----------+---------+  Antecubital fossa  0.38               +-----------------+-------------+----------+---------+  Prox forearm     0.32        branching  +-----------------+-------------+----------+---------+  Mid forearm     0.26               +-----------------+-------------+----------+---------+  Wrist        0.34               +-----------------+-------------+----------+---------+   +-----------------+-------------+----------+---------+  Left Basilic   Diameter (cm)Depth (cm)Findings   +-----------------+-------------+----------+---------+  Mid upper arm    0.28               +-----------------+-------------+----------+---------+  Dist upper arm    0.33               +-----------------+-------------+----------+---------+  Antecubital fossa  0.26        branching  +-----------------+-------------+----------+---------+ ASSESSMENT and PLAN   CKD4: We discussed proceeding with a left arm fistula.  This could either be a left radiocephalic or brachiocephalic fistula versus a for staged basilic vein.  Most likely, cephalic vein fistula.  I told him I would determine this in the operating room.  I did discuss the possibility of second procedure should the vein need to be elevated.  We also discussed the risk of steal syndrome and the risk of not maturity.  All of his questions were answered.  He will contact me in the near  future to get this scheduled.   Leia Alf, MD, FACS [facility] [phone] Pager [phone]

## 2020-01-10 ENCOUNTER — Encounter (HOSPITAL_BASED_OUTPATIENT_CLINIC_OR_DEPARTMENT_OTHER): Payer: Federal, State, Local not specified - PPO | Admitting: Cardiovascular Disease

## 2020-01-13 ENCOUNTER — Encounter (HOSPITAL_COMMUNITY)
Admission: RE | Admit: 2020-01-13 | Discharge: 2020-01-13 | Disposition: A | Discharge status: Home / Self Care | Payer: Federal, State, Local not specified - PPO | Source: Ambulatory Visit | Attending: Nephrology | Admitting: Nephrology

## 2020-01-13 ENCOUNTER — Other Ambulatory Visit: Payer: Self-pay

## 2020-01-13 VITALS — BP 172/108 | HR 84 | Resp 20

## 2020-01-13 DIAGNOSIS — N184 Chronic kidney disease, stage 4 (severe): Secondary | ICD-10-CM | POA: Insufficient documentation

## 2020-01-13 DIAGNOSIS — D631 Anemia in chronic kidney disease: Secondary | ICD-10-CM | POA: Diagnosis not present

## 2020-01-13 LAB — POCT HEMOGLOBIN-HEMACUE: Hemoglobin: 11.2 g/dL — ABNORMAL LOW (ref 13.0–17.0)

## 2020-01-13 LAB — IRON AND TIBC
Iron: 70 ug/dL (ref 45–182)
Saturation Ratios: 31 % (ref 17.9–39.5)
TIBC: 225 ug/dL — ABNORMAL LOW (ref 250–450)
UIBC: 155 ug/dL

## 2020-01-13 LAB — FERRITIN: Ferritin: 134 ng/mL (ref 24–336)

## 2020-01-13 MED ORDER — EPOETIN ALFA-EPBX 10000 UNIT/ML IJ SOLN: 20000.0000 [IU] | INTRAMUSCULAR | Status: DC

## 2020-01-13 MED ORDER — EPOETIN ALFA-EPBX 10000 UNIT/ML IJ SOLN
INTRAMUSCULAR | Status: AC
  Administered 2020-01-13: 20000 [IU] via SUBCUTANEOUS
  Filled 2020-01-13: qty 2

## 2020-01-16 DIAGNOSIS — N185 Chronic kidney disease, stage 5: Secondary | ICD-10-CM | POA: Diagnosis not present

## 2020-01-16 DIAGNOSIS — N2581 Secondary hyperparathyroidism of renal origin: Secondary | ICD-10-CM | POA: Diagnosis not present

## 2020-01-16 DIAGNOSIS — R809 Proteinuria, unspecified: Secondary | ICD-10-CM | POA: Diagnosis not present

## 2020-01-16 DIAGNOSIS — D631 Anemia in chronic kidney disease: Secondary | ICD-10-CM | POA: Diagnosis not present

## 2020-01-16 DIAGNOSIS — I120 Hypertensive chronic kidney disease with stage 5 chronic kidney disease or end stage renal disease: Secondary | ICD-10-CM | POA: Diagnosis not present

## 2020-01-25 ENCOUNTER — Other Ambulatory Visit: Payer: Self-pay

## 2020-01-25 ENCOUNTER — Other Ambulatory Visit: Payer: Self-pay | Admitting: Cardiovascular Disease

## 2020-01-25 DIAGNOSIS — E785 Hyperlipidemia, unspecified: Secondary | ICD-10-CM

## 2020-01-27 ENCOUNTER — Encounter (HOSPITAL_COMMUNITY)
Admission: RE | Admit: 2020-01-27 | Discharge: 2020-01-27 | Disposition: A | Discharge status: Home / Self Care | Payer: Federal, State, Local not specified - PPO | Source: Ambulatory Visit | Attending: Nephrology | Admitting: Nephrology

## 2020-01-27 ENCOUNTER — Other Ambulatory Visit: Payer: Self-pay

## 2020-01-27 VITALS — BP 162/103 | HR 86 | Temp 97.9°F | Resp 20

## 2020-01-27 DIAGNOSIS — D631 Anemia in chronic kidney disease: Secondary | ICD-10-CM

## 2020-01-27 DIAGNOSIS — N184 Chronic kidney disease, stage 4 (severe): Secondary | ICD-10-CM | POA: Diagnosis not present

## 2020-01-27 LAB — POCT HEMOGLOBIN-HEMACUE: Hemoglobin: 10.5 g/dL — ABNORMAL LOW (ref 13.0–17.0)

## 2020-01-27 MED ORDER — EPOETIN ALFA-EPBX 10000 UNIT/ML IJ SOLN
20000.0000 [IU] | INTRAMUSCULAR | Status: DC
  Administered 2020-01-27: 20000 [IU] via SUBCUTANEOUS

## 2020-01-27 MED ORDER — EPOETIN ALFA-EPBX 10000 UNIT/ML IJ SOLN
INTRAMUSCULAR | Status: AC
  Filled 2020-01-27: qty 2

## 2020-01-30 ENCOUNTER — Ambulatory Visit (INDEPENDENT_AMBULATORY_CARE_PROVIDER_SITE_OTHER): Payer: Federal, State, Local not specified - PPO | Admitting: Family Medicine

## 2020-02-08 ENCOUNTER — Other Ambulatory Visit: Payer: Self-pay

## 2020-02-08 ENCOUNTER — Telehealth: Payer: Self-pay | Admitting: Cardiovascular Disease

## 2020-02-08 ENCOUNTER — Other Ambulatory Visit
Admission: RE | Admit: 2020-02-08 | Discharge: 2020-02-08 | Disposition: A | Discharge status: Home / Self Care | Payer: Federal, State, Local not specified - PPO | Source: Ambulatory Visit | Attending: Surgery | Admitting: Surgery

## 2020-02-08 ENCOUNTER — Encounter (HOSPITAL_COMMUNITY): Payer: Self-pay | Admitting: Surgery

## 2020-02-08 DIAGNOSIS — Z20822 Contact with and (suspected) exposure to covid-19: Secondary | ICD-10-CM | POA: Insufficient documentation

## 2020-02-08 DIAGNOSIS — Z01812 Encounter for preprocedural laboratory examination: Secondary | ICD-10-CM | POA: Diagnosis not present

## 2020-02-08 MED ORDER — DEXTROSE 5 % IV SOLN
3.0000 g | INTRAVENOUS | Status: DC
  Filled 2020-02-08: qty 3000

## 2020-02-08 NOTE — Telephone Encounter (Signed)
Returned call to [place] (PA with anesthesia)- she states she was reviewing chart for patients upcoming procedure tomorrow (fistula placement).  She reviewed echocardiogram and wanted to confirm echo did not need to be repeated or any other concerns due to findings prior to scheduled procedure.    RN aware, will have Dr. Gwenlyn Found review in office and return call.

## 2020-02-08 NOTE — Telephone Encounter (Signed)
Per Dr. Gwenlyn Found and RN:  Brought findings of Dec. 2020 Echo to Dr. Kennon Holter attention. Dr. Gwenlyn Found reviewed images himself and consulted with 2 colleagues to insure that there was no left atrial thrombus. No thrombus was visualized on images. No need to repeat ECHO at this time    [facility] with anesthesia made aware.

## 2020-02-08 NOTE — Anesthesia Preprocedure Evaluation (Addendum)
Anesthesia Evaluation  Patient identified by MRN, date of birth, ID band Patient awake    Reviewed: Allergy & Precautions, NPO status , Patient's Chart, lab work & pertinent test results, reviewed documented beta blocker date and time   Airway Mallampati: II  TM Distance: >3 FB Neck ROM: Full    Dental  (+) Teeth Intact, Dental Advisory Given   Pulmonary sleep apnea ,    Pulmonary exam normal breath sounds clear to auscultation       Cardiovascular hypertension, Pt. on medications and Pt. on home beta blockers + Peripheral Vascular Disease  Normal cardiovascular exam Rhythm:Regular Rate:Normal  Echo 02/08/19: IMPRESSIONS  1. Left ventricular ejection fraction, by visual estimation, is 60 to  65%. The left ventricle has normal function. There is moderately increased  left ventricular hypertrophy.  2. Elevated left atrial and left ventricular end-diastolic pressures.  3. Left ventricular diastolic parameters are consistent with Grade II  diastolic dysfunction (pseudonormalization).  4. Global right ventricle has normal systolic function.The right  ventricular size is normal. No increase in right ventricular wall  thickness.  5. Left atrial size was normal.  6. Moderate sized left atrial thrombus.  7. Right atrial size was normal.  8. The mitral valve is normal in structure. No evidence of mitral valve  regurgitation. No evidence of mitral stenosis.  9. The tricuspid valve is normal in structure. Tricuspid valve  regurgitation is not demonstrated.  10. The aortic valve is normal in structure. Aortic valve regurgitation is  not visualized. No evidence of aortic valve sclerosis or stenosis.  11. The pulmonic valve was normal in structure. Pulmonic valve  regurgitation is not visualized.  12. The inferior vena cava is normal in size with greater than 50%  respiratory variability, suggesting right atrial pressure of 3 mmHg.   13. There is no evidence for a hypertrophic cardiomyopathy but rather  hypertensive heart disease. - 02/08/20: Dr. Gwenlyn Found and two other colleagues reviewed images and did not visualize any thrombus. (See note by Patria Mane, RN 02/08/20.)    Neuro/Psych negative neurological ROS     GI/Hepatic negative GI ROS, Neg liver ROS,   Endo/Other  negative endocrine ROSMorbid obesity  Renal/GU CRFRenal disease     Musculoskeletal negative musculoskeletal ROS (+)   Abdominal   Peds  Hematology  (+) Blood dyscrasia, anemia ,   Anesthesia Other Findings   Reproductive/Obstetrics                          Anesthesia Physical Anesthesia Plan  ASA: II  Anesthesia Plan: MAC   Post-op Pain Management:  Regional for Post-op pain   Induction: Intravenous  PONV Risk Score and Plan: 1 and Propofol infusion and Treatment may vary due to age or medical condition  Airway Management Planned: Nasal Cannula and Natural Airway  Additional Equipment:   Intra-op Plan:   Post-operative Plan:   Informed Consent: I have reviewed the patients History and Physical, chart, labs and discussed the procedure including the risks, benefits and alternatives for the proposed anesthesia with the patient or authorized representative who has indicated his/her understanding and acceptance.       Plan Discussed with: CRNA  Anesthesia Plan Comments: (PAT note written 02/08/2020 by Myra Gianotti, PA-C. )      Anesthesia Quick Evaluation

## 2020-02-08 NOTE — Telephone Encounter (Signed)
New message:     Ebony Hail from [facility] calling to get a report and results to discuss a ECHO from last year. Please call back [phone]

## 2020-02-08 NOTE — Progress Notes (Signed)
PCP - Briscoe Deutscher, DO Cardiologist - Dr Quay Burow  Chest x-ray - n/a EKG - 10/11/19 Stress Test - n/a ECHO - 02/08/19 Cardiac Cath - n/a  Sleep Study -  Yes CPAP - Does not use cpap   Anesthesia review: Yes  STOP now taking any Aspirin (unless otherwise instructed by your surgeon), Aleve, Naproxen, Ibuprofen, Motrin, Advil, Goody's, BC's, all herbal medications, fish oil, and all vitamins.   Coronavirus Screening Covid test is scheduled on 02/08/20 - pending results. Do you have any of the following symptoms:  Cough yes/no: No Fever (>100.12F)  yes/no: No Runny nose yes/no: No Sore throat yes/no: No Difficulty breathing/shortness of breath  yes/no: No  Have you traveled in the last 14 days and where? yes/no: No  Patient verbalized understanding of instructions that were given via phone.

## 2020-02-08 NOTE — Progress Notes (Addendum)
Anesthesia Chart Review:  Case: [id number] Date/Time: 02/09/20 0715   Procedure: LEFT ARM FISTULA CREATION (Left )   Anesthesia type: Monitor Anesthesia Care   Pre-op diagnosis: CHRONIC RENAL INSUFFICIENCY STAGE IV   Location: [facility] OR ROOM 12 / [place] [facility]   Surgeons: Serafina Mitchell, MD      DISCUSSION: Patient is a 49 year old male scheduled for the above procedure.  History includes HTN, CKD (stage IV; biopsy-proven membranous glomerulonephritis s/p Cytoxan/prednisone 06/2010; started rituximab 03/2019), OSA, anemia (last Retacrit 01/27/20), HLD, edema, DVT (right CFV, femoral, popliteal, PT veins DVT 03/10/13 following right knee meniscus repair 02/11/13; post-phlebitic syndrome), obesity.  Last visit with cardiologist Dr. Gwenlyn Found 05/03/19 for follow-up difficult to control HTN despite multiple medications.  Not on ACEi/ARB due to CKD. He was referred to Skeet Latch, MD at the [facility]. He was also scheduled for sleep study (done 07/04/19 and showed severe OSA). I don't see that he has been seen in the [facility] Clinic and cancelled his sleep follow-up with Shelva Majestic, MD.   02/28/19 echo report documented moderate sized left atrial thrombus. He had not been on anticoagulation. I contacted Dr. Kennon Holter office to clarify. Dr. Gwenlyn Found and two other colleagues reviewed images and did not see any thrombus on visualized images, so no repeat ECHO planned at this time. (See note by Patria Mane, RN 02/08/20.)    02/08/2020 presurgical COVID-19 test in process.  He is a same-day work-up so labs and anesthesia team evaluation on the day of surgery.   VS: As of 01/27/20 BP 162/103 and HR 86   PROVIDERS: Maximiano Coss, NP is PCP  Briscoe Deutscher, DO is provider at [facility] Lawson Radar, MD is nephrologist Quay Burow, MD is cardiologist   LABS: He is for labs on the day of surgery. HGB 10.5 on 01/27/20.    Sleep Study 07/04/19: IMPRESSIONS - Severe obstructive  sleep apnea occurred during this study (AHI 116.1/h) with absence of REM sleep.  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 - Moderate oxygen desaturation to a nadir of 81.0%. - The patient snored with loud snoring volume. - Reduced sleep efficiency at only 28.4%. - PACs were noted during this study. - Clinically significant periodic limb movements did not occur during sleep. No significant associated arousals. RECOMMENDATIONS - CPAP titration to determine optimal pressure required to alleviate sleep disordered breathing. BiPAP or ASV titration may be required to eliminate central sleep apnea. - Effort should be made to optimize nasal and oropharyngeal patency. - Avoid alcohol, sedatives and other CNS depressants that may worsen sleep apnea and disrupt normal sleep architecture. - Sleep hygiene should be reviewed to assess factors that may improve sleep quality. - Weight management (BMI 50) and regular exercise should be initiated or continued if appropriate.    EKG: EKG 10/11/19 (done at [facility]): Sinus  Rhythm  Negative T-waves  -Possible  Inferior  ischemia  -consider inferior apical infarct (age undetermined).  - Negative T wave in III and V6. There is some baseline motion in limb leads, but I don't observer definite consistent T wave inversion in inferior leads II or aVF.    CV: Echo 02/08/19: IMPRESSIONS  1. Left ventricular ejection fraction, by visual estimation, is 60 to  65%. The left ventricle has normal function. There is moderately increased  left ventricular hypertrophy.  2. Elevated left atrial and left ventricular end-diastolic pressures.  3. Left ventricular diastolic parameters are consistent  with Grade II  diastolic dysfunction (pseudonormalization).  4. Global right ventricle has normal systolic function.The right  ventricular size is normal. No increase in right ventricular wall  thickness.  5. Left atrial size was  normal.  6. Moderate sized left atrial thrombus.  7. Right atrial size was normal.  8. The mitral valve is normal in structure. No evidence of mitral valve  regurgitation. No evidence of mitral stenosis.  9. The tricuspid valve is normal in structure. Tricuspid valve  regurgitation is not demonstrated.  10. The aortic valve is normal in structure. Aortic valve regurgitation is  not visualized. No evidence of aortic valve sclerosis or stenosis.  11. The pulmonic valve was normal in structure. Pulmonic valve  regurgitation is not visualized.  12. The inferior vena cava is normal in size with greater than 50%  respiratory variability, suggesting right atrial pressure of 3 mmHg.  13. There is no evidence for a hypertrophic cardiomyopathy but rather  hypertensive heart disease. - 02/08/20: Dr. Gwenlyn Found and two other colleagues reviewed images and did not visualize any thrombus. (See note by Patria Mane, RN 02/08/20.)   Past Medical History:  Diagnosis Date  . Anemia   . Chronic kidney disease 01/2020   stage 4  . 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 History of torn meniscus of right knee   . HLD (hyperlipidemia)   . HTN (hypertension)   . Hx of blood clots   . Lower extremity edema   . 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 Sleep apnea    uses cpap  . Vitamin D deficiency     Past Surgical History:  Procedure Laterality Date  . MENISCUS REPAIR Right 12/14    MEDICATIONS: No current facility-administered medications for this encounter.   Marland Kitchen amLODipine (NORVASC) 10 MG tablet  . atorvastatin (LIPITOR) 80 MG tablet  . calcitRIOL (ROCALTROL) 0.25 MCG capsule  . FEROSUL 325 (65 Fe) MG tablet  . furosemide (LASIX) 40 MG tablet  . hydrALAZINE (APRESOLINE) 50 MG tablet  . labetalol (NORMODYNE) 300 MG tablet    Myra Gianotti, PA-C Surgical Short Stay/Anesthesiology [facility] Phone [phone] [facility] Phone [phone] 02/08/2020 2:11  PM

## 2020-02-09 ENCOUNTER — Ambulatory Visit (HOSPITAL_COMMUNITY): Payer: Federal, State, Local not specified - PPO | Admitting: Vascular Surgery

## 2020-02-09 ENCOUNTER — Encounter (HOSPITAL_COMMUNITY)
Admission: RE | Disposition: A | Discharge status: Home / Self Care | Payer: Self-pay | Source: Home / Self Care | Attending: Surgery

## 2020-02-09 ENCOUNTER — Other Ambulatory Visit: Payer: Self-pay

## 2020-02-09 ENCOUNTER — Encounter (HOSPITAL_COMMUNITY): Payer: Self-pay | Admitting: Surgery

## 2020-02-09 ENCOUNTER — Ambulatory Visit (HOSPITAL_COMMUNITY)
Admission: RE | Admit: 2020-02-09 | Discharge: 2020-02-09 | Disposition: A | Discharge status: Home / Self Care | Payer: Federal, State, Local not specified - PPO | Attending: Surgery | Admitting: Surgery

## 2020-02-09 DIAGNOSIS — D631 Anemia in chronic kidney disease: Secondary | ICD-10-CM | POA: Diagnosis not present

## 2020-02-09 DIAGNOSIS — Z809 Family history of malignant neoplasm, unspecified: Secondary | ICD-10-CM | POA: Diagnosis not present

## 2020-02-09 DIAGNOSIS — I513 Intracardiac thrombosis, not elsewhere classified: Secondary | ICD-10-CM | POA: Diagnosis not present

## 2020-02-09 DIAGNOSIS — I1310 Hypertensive heart and chronic kidney disease without heart failure, with stage 1 through stage 4 chronic kidney disease, or unspecified chronic kidney disease: Secondary | ICD-10-CM | POA: Insufficient documentation

## 2020-02-09 DIAGNOSIS — N184 Chronic kidney disease, stage 4 (severe): Secondary | ICD-10-CM | POA: Insufficient documentation

## 2020-02-09 DIAGNOSIS — E7800 Pure hypercholesterolemia, unspecified: Secondary | ICD-10-CM | POA: Diagnosis not present

## 2020-02-09 DIAGNOSIS — Z86718 Personal history of other venous thrombosis and embolism: Secondary | ICD-10-CM | POA: Diagnosis not present

## 2020-02-09 DIAGNOSIS — I129 Hypertensive chronic kidney disease with stage 1 through stage 4 chronic kidney disease, or unspecified chronic kidney disease: Secondary | ICD-10-CM | POA: Diagnosis not present

## 2020-02-09 DIAGNOSIS — Z833 Family history of diabetes mellitus: Secondary | ICD-10-CM | POA: Insufficient documentation

## 2020-02-09 DIAGNOSIS — N185 Chronic kidney disease, stage 5: Secondary | ICD-10-CM | POA: Diagnosis not present

## 2020-02-09 DIAGNOSIS — N052 Unspecified nephritic syndrome with diffuse membranous glomerulonephritis: Secondary | ICD-10-CM | POA: Diagnosis not present

## 2020-02-09 DIAGNOSIS — Z79899 Other long term (current) drug therapy: Secondary | ICD-10-CM | POA: Diagnosis not present

## 2020-02-09 DIAGNOSIS — Z8249 Family history of ischemic heart disease and other diseases of the circulatory system: Secondary | ICD-10-CM | POA: Diagnosis not present

## 2020-02-09 HISTORY — PX: AV FISTULA PLACEMENT: SHX1204

## 2020-02-09 LAB — POCT I-STAT, CHEM 8
BUN: 96 mg/dL — ABNORMAL HIGH (ref 6–20)
Calcium, Ion: 1.12 mmol/L — ABNORMAL LOW (ref 1.15–1.40)
Chloride: 115 mmol/L — ABNORMAL HIGH (ref 98–111)
Creatinine, Ser: 8.6 mg/dL — ABNORMAL HIGH (ref 0.61–1.24)
Glucose, Bld: 99 mg/dL (ref 70–99)
HCT: 31 % — ABNORMAL LOW (ref 39.0–52.0)
Hemoglobin: 10.5 g/dL — ABNORMAL LOW (ref 13.0–17.0)
Potassium: 4.8 mmol/L (ref 3.5–5.1)
Sodium: 143 mmol/L (ref 135–145)
TCO2: 20 mmol/L — ABNORMAL LOW (ref 22–32)

## 2020-02-09 LAB — SARS CORONAVIRUS 2 (TAT 6-24 HRS): SARS Coronavirus 2: NEGATIVE

## 2020-02-09 LAB — GLUCOSE, CAPILLARY: Glucose-Capillary: 96 mg/dL (ref 70–99)

## 2020-02-09 SURGERY — ARTERIOVENOUS (AV) FISTULA CREATION
Anesthesia: Monitor Anesthesia Care | Laterality: Left

## 2020-02-09 MED ORDER — PROPOFOL 10 MG/ML IV BOLUS
INTRAVENOUS | Status: AC
  Filled 2020-02-09: qty 20

## 2020-02-09 MED ORDER — ONDANSETRON HCL 4 MG/2ML IJ SOLN
INTRAMUSCULAR | Status: DC | PRN
  Administered 2020-02-09: 4 mg via INTRAVENOUS

## 2020-02-09 MED ORDER — DEXAMETHASONE SODIUM PHOSPHATE 10 MG/ML IJ SOLN
INTRAMUSCULAR | Status: AC
  Filled 2020-02-09: qty 1

## 2020-02-09 MED ORDER — LIDOCAINE-EPINEPHRINE (PF) 1 %-1:200000 IJ SOLN
INTRAMUSCULAR | Status: AC
  Filled 2020-02-09: qty 30

## 2020-02-09 MED ORDER — 0.9 % SODIUM CHLORIDE (POUR BTL) OPTIME
TOPICAL | Status: DC | PRN
  Administered 2020-02-09: 1000 mL

## 2020-02-09 MED ORDER — LIDOCAINE HCL (PF) 2 % IJ SOLN
INTRAMUSCULAR | Status: AC
  Filled 2020-02-09: qty 5

## 2020-02-09 MED ORDER — OXYCODONE-ACETAMINOPHEN 5-325 MG PO TABS
1.0000 | ORAL_TABLET | Freq: Four times a day (QID) | ORAL | 0 refills | Status: DC | PRN
Start: 2020-02-09 — End: 2020-03-21

## 2020-02-09 MED ORDER — SODIUM CHLORIDE 0.9 % IV SOLN: INTRAVENOUS | Status: DC

## 2020-02-09 MED ORDER — SODIUM CHLORIDE 0.9 % IV SOLN
INTRAVENOUS | Status: AC
  Filled 2020-02-09: qty 1.2

## 2020-02-09 MED ORDER — SODIUM CHLORIDE 0.9 % IV SOLN
INTRAVENOUS | Status: DC | PRN
  Administered 2020-02-09: 500 mL

## 2020-02-09 MED ORDER — ACETAMINOPHEN 500 MG PO TABS
1000.0000 mg | ORAL_TABLET | Freq: Once | ORAL | Status: AC
  Administered 2020-02-09: 1000 mg via ORAL
  Filled 2020-02-09: qty 2

## 2020-02-09 MED ORDER — FENTANYL CITRATE (PF) 100 MCG/2ML IJ SOLN
INTRAMUSCULAR | Status: DC | PRN
  Administered 2020-02-09 (×2): 25 ug via INTRAVENOUS

## 2020-02-09 MED ORDER — MIDAZOLAM HCL 2 MG/2ML IJ SOLN
INTRAMUSCULAR | Status: AC
  Filled 2020-02-09: qty 2

## 2020-02-09 MED ORDER — FENTANYL CITRATE (PF) 250 MCG/5ML IJ SOLN
INTRAMUSCULAR | Status: AC
  Filled 2020-02-09: qty 5

## 2020-02-09 MED ORDER — DEXTROSE 5 % IV SOLN
INTRAVENOUS | Status: DC | PRN
  Administered 2020-02-09: 3 g via INTRAVENOUS

## 2020-02-09 MED ORDER — CHLORHEXIDINE GLUCONATE 0.12 % MT SOLN
OROMUCOSAL | Status: AC
  Administered 2020-02-09: 15 mL
  Filled 2020-02-09: qty 15

## 2020-02-09 MED ORDER — CHLORHEXIDINE GLUCONATE 4 % EX LIQD: 60.0000 mL | Freq: Once | CUTANEOUS | Status: DC

## 2020-02-09 MED ORDER — PROPOFOL 500 MG/50ML IV EMUL
INTRAVENOUS | Status: DC | PRN
  Administered 2020-02-09: 25 ug/kg/min via INTRAVENOUS

## 2020-02-09 MED ORDER — SODIUM CHLORIDE 0.9 % IV SOLN: INTRAVENOUS | Status: DC | PRN

## 2020-02-09 MED ORDER — MIDAZOLAM HCL 5 MG/5ML IJ SOLN
INTRAMUSCULAR | Status: DC | PRN
  Administered 2020-02-09 (×2): 1 mg via INTRAVENOUS

## 2020-02-09 MED ORDER — ONDANSETRON HCL 4 MG/2ML IJ SOLN
INTRAMUSCULAR | Status: AC
  Filled 2020-02-09: qty 2

## 2020-02-09 MED ORDER — LIDOCAINE-EPINEPHRINE (PF) 1 %-1:200000 IJ SOLN: INTRAMUSCULAR | Status: DC | PRN

## 2020-02-09 MED ORDER — BUPIVACAINE-EPINEPHRINE (PF) 0.5% -1:200000 IJ SOLN
INTRAMUSCULAR | Status: DC | PRN
  Administered 2020-02-09: 30 mL via PERINEURAL

## 2020-02-09 SURGICAL SUPPLY — 35 items
ADH SKN CLS APL DERMABOND .7 (GAUZE/BANDAGES/DRESSINGS) ×1
ARMBAND PINK RESTRICT EXTREMIT (MISCELLANEOUS) ×6 IMPLANT
CANISTER SUCT 3000ML PPV (MISCELLANEOUS) ×3 IMPLANT
CLIP VESOCCLUDE MED 6/CT (CLIP) ×3 IMPLANT
CLIP VESOCCLUDE SM WIDE 6/CT (CLIP) ×3 IMPLANT
COVER PROBE W GEL 5X96 (DRAPES) ×3 IMPLANT
COVER WAND RF STERILE (DRAPES) ×3 IMPLANT
DERMABOND ADVANCED (GAUZE/BANDAGES/DRESSINGS) ×2
DERMABOND ADVANCED .7 DNX12 (GAUZE/BANDAGES/DRESSINGS) ×1 IMPLANT
ELECT REM PT RETURN 9FT ADLT (ELECTROSURGICAL) ×3
ELECTRODE REM PT RTRN 9FT ADLT (ELECTROSURGICAL) ×1 IMPLANT
GLOVE BIO SURGEON STRL SZ 6.5 (GLOVE) ×6 IMPLANT
GLOVE BIO SURGEONS STRL SZ 6.5 (GLOVE) ×3
GLOVE BIOGEL PI IND STRL 7.5 (GLOVE) ×1 IMPLANT
GLOVE BIOGEL PI INDICATOR 7.5 (GLOVE) ×2
GLOVE SURG SS PI 7.5 STRL IVOR (GLOVE) ×9 IMPLANT
GLOVE SURG UNDER POLY LF SZ6.5 (GLOVE) ×9 IMPLANT
GOWN STRL REUS W/ TWL LRG LVL3 (GOWN DISPOSABLE) ×2 IMPLANT
GOWN STRL REUS W/ TWL XL LVL3 (GOWN DISPOSABLE) ×1 IMPLANT
GOWN STRL REUS W/TWL LRG LVL3 (GOWN DISPOSABLE) ×6
GOWN STRL REUS W/TWL XL LVL3 (GOWN DISPOSABLE) ×3
HEMOSTAT SNOW SURGICEL 2X4 (HEMOSTASIS) IMPLANT
KIT BASIN OR (CUSTOM PROCEDURE TRAY) ×3 IMPLANT
KIT TURNOVER KIT B (KITS) ×3 IMPLANT
NS IRRIG 1000ML POUR BTL (IV SOLUTION) ×3 IMPLANT
PACK CV ACCESS (CUSTOM PROCEDURE TRAY) ×3 IMPLANT
PAD ARMBOARD 7.5X6 YLW CONV (MISCELLANEOUS) ×6 IMPLANT
SPONGE LAP 18X18 X RAY DECT (DISPOSABLE) ×3 IMPLANT
SUT PROLENE 6 0 CC (SUTURE) ×3 IMPLANT
SUT VIC AB 3-0 SH 27 (SUTURE) ×3
SUT VIC AB 3-0 SH 27X BRD (SUTURE) ×1 IMPLANT
SUT VICRYL 4-0 PS2 18IN ABS (SUTURE) ×3 IMPLANT
TOWEL GREEN STERILE (TOWEL DISPOSABLE) ×3 IMPLANT
UNDERPAD 30X36 HEAVY ABSORB (UNDERPADS AND DIAPERS) ×3 IMPLANT
WATER STERILE IRR 1000ML POUR (IV SOLUTION) ×3 IMPLANT

## 2020-02-09 NOTE — Discharge Instructions (Signed)
   [facility]  Discharge Instructions  AV Fistula or Graft Surgery for Dialysis Access  Please refer to the following instructions for your post-procedure care. Your surgeon or physician assistant will discuss any changes with you.  Activity  You may drive the day following your surgery, if you are comfortable and no longer taking prescription pain medication. Resume full activity as the soreness in your incision resolves.  Bathing/Showering  You may shower after you go home. Keep your incision dry for 48 hours. Do not soak in a bathtub, hot tub, or swim until the incision heals completely. You may not shower if you have a hemodialysis catheter.  Incision Care  Clean your incision with mild soap and water after 48 hours. Pat the area dry with a clean towel. You do not need a bandage unless otherwise instructed. Do not apply any ointments or creams to your incision. You may have skin glue on your incision. Do not peel it off. It will come off on its own in about one week. Your arm may swell a bit after surgery. To reduce swelling use pillows to elevate your arm so it is above your heart. Your doctor will tell you if you need to lightly wrap your arm with an ACE bandage.  Diet  Resume your normal diet. There are not special food restrictions following this procedure. In order to heal from your surgery, it is CRITICAL to get adequate nutrition. Your body requires vitamins, minerals, and protein. Vegetables are the best source of vitamins and minerals. Vegetables also provide the perfect balance of protein. Processed food has little nutritional value, so try to avoid this.  Medications  Resume taking all of your medications. If your incision is causing pain, you may take over-the counter pain relievers such as acetaminophen (Tylenol). If you were prescribed a stronger pain medication, please be aware these medications can cause nausea and constipation. Prevent  nausea by taking the medication with a snack or meal. Avoid constipation by drinking plenty of fluids and eating foods with high amount of fiber, such as fruits, vegetables, and grains.  Do not take Tylenol if you are taking prescription pain medications.  Follow up Your surgeon may want to see you in the office following your access surgery. If so, this will be arranged at the time of your surgery.  Please call us immediately for any of the following conditions:  . Increased pain, redness, drainage (pus) from your incision site . Fever of 101 degrees or higher . Severe or worsening pain at your incision site . Hand pain or numbness. .  Reduce your risk of vascular disease:  . Stop smoking. If you would like help, call [facility] at 1-800-QUIT-NOW 906-841-4611) or [place] at (337)269-3633  . Manage your cholesterol . Maintain a desired weight . Control your diabetes . Keep your blood pressure down  Dialysis  It will take several weeks to several months for your new dialysis access to be ready for use. Your surgeon will determine when it is okay to use it. Your nephrologist will continue to direct your dialysis. You can continue to use your Permcath until your new access is ready for use.   02/09/2020 AZEL GUMINA [id number] [birth year]  Surgeon(s): Serafina Mitchell, MD  Procedure(s): Creation of left radial cephalic AV fistula  x Do not stick fistula for 12 weeks    If you have any questions, please call the office at [phone].

## 2020-02-09 NOTE — Anesthesia Procedure Notes (Signed)
Anesthesia Regional Block: Supraclavicular block   Pre-Anesthetic Checklist: ,, timeout performed, Correct Patient, Correct Site, Correct Laterality, Correct Procedure, Correct Position, site marked, Risks and benefits discussed,  Surgical consent,  Pre-op evaluation,  At surgeon's request and post-op pain management  Laterality: Left  Prep: chloraprep       Needles:  Injection technique: Single-shot  Needle Type: Echogenic Needle     Needle Length: 9cm  Needle Gauge: 21     Additional Needles:   Procedures:,,,, ultrasound used (permanent image in chart),,,,  Narrative:  Start time: 02/09/2020 7:05 AM End time: 02/09/2020 7:15 AM Injection made incrementally with aspirations every 5 mL.  Performed by: Personally  Anesthesiologist: Catalina Gravel, MD  Additional Notes: No pain on injection. No increased resistance to injection. Injection made in 5cc increments.  Good needle visualization.  Patient tolerated procedure well.

## 2020-02-09 NOTE — Op Note (Signed)
    Patient name: KOAH CHISENHALL MRN: [id number] DOB: [birth year] Sex: male  02/09/2020 Pre-operative Diagnosis: CKD Post-operative diagnosis:  Same Surgeon:  Annamarie Major Assistants: Aldona Bar Ryne Procedure:   Left radiocephalic fistula Anesthesia: Axillary block Blood Loss: Minimal Specimens: None  Findings: Healthy 2.5 mm radial artery and 3 mm cephalic vein at the wrist  Indications: The patient is here for today for dialysis access  Procedure:  The patient was identified in the holding area and taken to [place] [facility]  The patient was then placed supine on the table. regional anesthesia was administered.  The patient was prepped and draped in the usual sterile fashion.  A time out was called and antibiotics were administered.  A PA was necessary to expedite the procedure and assist with the technical details.  Ultrasound was used to evaluate the cephalic vein of the upper arm.  This appeared to be of uniform caliber all the way down to the wrist.  The radial artery at the wrist appeared to be disease-free.  Therefore I elected to proceed with a radiocephalic fistula.  A longitudinal incision was made just proximal to the radial head.  Cautery was used about the subcutaneous tissue.  I first exposed the cephalic vein.  This is a healthy 3 mm vein.  Multiple side branches were ligated between silk ties and the vein was marked for orientation.  I then dissected out the radial artery.  This was a disease-free 2.5 mm artery.  The cephalic vein was then ligated distally.  I distended nicely with heparin saline.  Next the radial artery was occluded with Serafin clamps and a #11 blade was used to make an arteriotomy.  This was extended longitudinally with Potts scissors.  The vein was cut to the appropriate length and spatulated to fit the size the arteriotomy.  A running anastomosis was created with 6-0 Prolene.  Prior to completion the appropriate flushing maneuvers were performed and the  anastomosis was completed.  I inspected the course of the vein to make sure there were no kinks or identifiable branches.  There was a good thrill within the fistula and a brisk radial artery Doppler signal distal to the anastomosis.  The wound was irrigated.  Hemostasis was achieved.  The incision was closed with 2 layers of 3-0 Vicryl followed by Dermabond.  There were no immediate complications.   Disposition: To PACU stable.   Theotis Burrow, M.D., [facility] [place] Office: [phone] Pager:  [phone]

## 2020-02-09 NOTE — H&P (Signed)
[facility] [place]  Patient name: Hector Brooks        MRN: [id number]        DOB: 08/06/1970          Sex: male   REQUESTING PROVIDER:    Dr. Carolin Sicks   REASON FOR CONSULT:    Dialysis access  HISTORY OF PRESENT ILLNESS:   Hector Brooks is a 49 y.o. male, who is referred for dialysis access.  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  He is right-handed.  He has not had a pacemaker or defibrillator.  He has a history of provoked DVT in the right leg.  He is no longer on anticoagulation.  He is medically managed for hypertension.  He is a non-smoker.  PAST MEDICAL HISTORY    Past Medical History:  Diagnosis Date  . Anemia   . Chronic kidney disease   . 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 History of torn meniscus of right knee   . HLD (hyperlipidemia)   . HTN (hypertension)   . Hx of blood clots   . Lower extremity edema   . 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 Sleep apnea   . Vitamin D deficiency      FAMILY HISTORY        Family History  Problem Relation Age of Onset  . Cancer Father   . Diabetes Father   . Drug abuse Father   . Hyperlipidemia Sister   . Diabetes Maternal Grandfather   . Heart disease Maternal Grandfather     SOCIAL HISTORY:   Social History        Socioeconomic History  . Marital status: Married    Spouse name: Not on file  . Number of children: Not on file  . Years of education: Not on file  . Highest education level: Not on file  Occupational History  . Not on file  Tobacco Use  . Smoking status: Never Smoker  . Smokeless tobacco: Never Used  Vaping Use  . Vaping Use: Never used  Substance and Sexual Activity  . Alcohol use: No    Alcohol/week: 0.0 standard drinks  . Drug use: No  . Sexual activity: Yes  Other Topics Concern  . Not on file  Social History Narrative  . Not  on file   Social Determinants of Health   Financial Resource Strain:   . Difficulty of Paying Living Expenses: Not on file  Food Insecurity:   . Worried About Charity fundraiser in the Last Year: Not on file  . Ran Out of Food in the Last Year: Not on file  Transportation Needs:   . Lack of Transportation (Medical): Not on file  . Lack of Transportation (Non-Medical): Not on file  Physical Activity:   . Days of Exercise per Week: Not on file  . Minutes of Exercise per Session: Not on file  Stress:   . Feeling of Stress : Not on file  Social Connections:   . Frequency of Communication with Friends and Family: Not on file  . Frequency of Social Gatherings with Friends and Family: Not on file  . Attends Religious Services: Not on file  . Active Member of Clubs or Organizations: Not on file  . Attends Archivist Meetings: Not on file  . Marital Status: Not on file  Intimate Partner Violence:   . Fear of Current or Ex-Partner: Not on file  .  Emotionally Abused: Not on file  . Physically Abused: Not on file  . Sexually Abused: Not on file    ALLERGIES:    No Known Allergies  CURRENT MEDICATIONS:          Current Outpatient Medications  Medication Sig Dispense Refill  . amLODipine (NORVASC) 10 MG tablet Take 10 mg by mouth daily.    Marland Kitchen atorvastatin (LIPITOR) 80 MG tablet Take 0.5 tablets (40 mg total) by mouth daily. 90 tablet 3  . calcitRIOL (ROCALTROL) 0.25 MCG capsule Take 0.25 mcg by mouth daily.    . FEROSUL 325 (65 Fe) MG tablet Take 325 mg by mouth daily.    . furosemide (LASIX) 40 MG tablet Take 1 tablet (40 mg total) by mouth daily. 90 tablet 1  . hydrALAZINE (APRESOLINE) 50 MG tablet Take 1 tablet (50 mg total) by mouth 3 (three) times daily. 270 tablet 3  . labetalol (NORMODYNE) 300 MG tablet Take 1 tablet (300 mg total) by mouth 2 (two) times daily. 180 tablet 3   No current facility-administered medications for this visit.     REVIEW OF SYSTEMS:   [X]  denotes positive finding, [ ]  denotes negative finding Cardiac  Comments:  Chest pain or chest pressure:    Shortness of breath upon exertion:    Short of breath when lying flat:    Irregular heart rhythm:        Vascular    Pain in calf, thigh, or hip brought on by ambulation:    Pain in feet at night that wakes you up from your sleep:     Blood clot in your veins:    Leg swelling:  x       Pulmonary    Oxygen at home:    Productive cough:     Wheezing:         Neurologic    Sudden weakness in arms or legs:     Sudden numbness in arms or legs:     Sudden onset of difficulty speaking or slurred speech:    Temporary loss of vision in one eye:     Problems with dizziness:         Gastrointestinal    Blood in stool:      Vomited blood:         Genitourinary    Burning when urinating:     Blood in urine:        Psychiatric    Major depression:         Hematologic    Bleeding problems:    Problems with blood clotting too easily:        Skin    Rashes or ulcers:        Constitutional    Fever or chills:     PHYSICAL EXAM:      Vitals:   01/09/20 1118  BP: (!) 161/114  Pulse: 78  Resp: 20  Temp: 98.3 F (36.8 C)  SpO2: 98%  Weight: (!) 301 lb (136.5 kg)  Height: 5\' 9"  (1.753 m)    GENERAL: The patient is a well-nourished male, in no acute distress. The vital signs are documented above. CARDIAC: There is a regular rate and rhythm.  VASCULAR: Palpable left brachial and radial pulse PULMONARY: Nonlabored respirations MUSCULOSKELETAL: There are no major deformities or cyanosis. NEUROLOGIC: No focal weakness or paresthesias are detected. SKIN: There are no ulcers or rashes noted. PSYCHIATRIC: The patient has a normal affect.  STUDIES:   I have  reviewed the following: Arterial: Right: No obstruction visualized in the  right upper extremity.  Left: No obstruction visualized in the left upper extremity.   +-----------------+-------------+----------+--------------+  Right Cephalic  Diameter (cm)Depth (cm)  Findings    +-----------------+-------------+----------+--------------+  Shoulder       0.38                 +-----------------+-------------+----------+--------------+  Mid upper arm    0.27          tortuous    +-----------------+-------------+----------+--------------+  Dist upper arm              not visualized  +-----------------+-------------+----------+--------------+  Antecubital fossa  0.31                 +-----------------+-------------+----------+--------------+  Prox forearm     0.26                 +-----------------+-------------+----------+--------------+  Mid forearm     0.33                 +-----------------+-------------+----------+--------------+  Wrist        0.31                 +-----------------+-------------+----------+--------------+   +-----------------+-------------+----------+---------+  Right Basilic  Diameter (cm)Depth (cm)Findings   +-----------------+-------------+----------+---------+  Mid upper arm    0.49               +-----------------+-------------+----------+---------+  Dist upper arm    0.46        branching  +-----------------+-------------+----------+---------+  Antecubital fossa  0.49               +-----------------+-------------+----------+---------+  Prox forearm     0.51               +-----------------+-------------+----------+---------+   +-----------------+-------------+----------+---------+  Left Cephalic  Diameter (cm)Depth (cm)Findings   +-----------------+-------------+----------+---------+   Shoulder       0.42               +-----------------+-------------+----------+---------+  Mid upper arm    0.44        tortuous   +-----------------+-------------+----------+---------+  Dist upper arm    0.27               +-----------------+-------------+----------+---------+  Antecubital fossa  0.38               +-----------------+-------------+----------+---------+  Prox forearm     0.32        branching  +-----------------+-------------+----------+---------+  Mid forearm     0.26               +-----------------+-------------+----------+---------+  Wrist        0.34               +-----------------+-------------+----------+---------+   +-----------------+-------------+----------+---------+  Left Basilic   Diameter (cm)Depth (cm)Findings   +-----------------+-------------+----------+---------+  Mid upper arm    0.28               +-----------------+-------------+----------+---------+  Dist upper arm    0.33               +-----------------+-------------+----------+---------+  Antecubital fossa  0.26        branching  +-----------------+-------------+----------+---------+ ASSESSMENT and PLAN   CKD4: We discussed proceeding with a left arm fistula.  This could either be a left radiocephalic or brachiocephalic fistula versus a for staged basilic vein.  Most likely, cephalic vein fistula.  I told him I would determine this in the operating room.  I did discuss the  possibility of second procedure should the vein need to be elevated.  We also discussed the risk of steal syndrome and the risk of not maturity.  All of his questions were answered.  He will contact me in the near future to get this scheduled.   Leia Alf, MD, FACS [facility] [phone] Pager [phone]  He has not had any changes since I saw him on 01-09-2020.  AFVSS PE:RRR PULM:CTABN NEURO:  No focal deficits  PLAN:  We again discussed proceeding with a left arm fistula either at the wrist or arm crease.  I discussed the details and risks and benefits.  All questions answered  Annamarie Major

## 2020-02-09 NOTE — Anesthesia Procedure Notes (Signed)
Procedure Name: MAC Date/Time: 02/09/2020 7:40 AM Performed by: Harden Mo, CRNA Pre-anesthesia Checklist: Patient identified, Emergency Drugs available, Suction available and Patient being monitored Patient Re-evaluated:Patient Re-evaluated prior to induction Oxygen Delivery Method: Simple face mask Preoxygenation: Pre-oxygenation with 100% oxygen Induction Type: IV induction Placement Confirmation: positive ETCO2 and breath sounds checked- equal and bilateral Dental Injury: Teeth and Oropharynx as per pre-operative assessment

## 2020-02-09 NOTE — Progress Notes (Signed)
Orthopedic Tech Progress Note Patient Details:  Hector Brooks December 11, 1970 [id number]  Ortho Devices Type of Ortho Device: Arm sling Ortho Device/Splint Location: LUE Ortho Device/Splint Interventions: Ordered,Application,Adjustment   Post Interventions Patient Tolerated: Well Instructions Provided: Poper ambulation with device   Elton Catalano A [name] 02/09/2020, 9:33 AM

## 2020-02-09 NOTE — Anesthesia Postprocedure Evaluation (Signed)
Anesthesia Post Note  Patient: Hector Brooks  Procedure(s) Performed: LEFT ARM FISTULA CREATION (Left )     Patient location during evaluation: PACU Anesthesia Type: MAC and Regional Level of consciousness: awake and alert, awake and oriented Pain management: pain level controlled Vital Signs Assessment: post-procedure vital signs reviewed and stable Respiratory status: spontaneous breathing, nonlabored ventilation and respiratory function stable Cardiovascular status: stable and blood pressure returned to baseline Postop Assessment: no apparent nausea or vomiting Anesthetic complications: no   No complications documented.  Last Vitals:  Vitals:   02/09/20 0919 02/09/20 0930  BP: 126/87 133/83  Pulse: 70 69  Resp: 15 16  Temp:  (!) 36.3 C  SpO2: 99% 96%    Last Pain:  Vitals:   02/09/20 0930  TempSrc:   PainSc: 0-No pain                 Catalina Gravel

## 2020-02-09 NOTE — Transfer of Care (Signed)
Immediate Anesthesia Transfer of Care Note  Patient: Hector Brooks  Procedure(s) Performed: LEFT ARM FISTULA CREATION (Left )  Patient Location: PACU  Anesthesia Type:MAC and Regional  Level of Consciousness: awake, alert  and oriented  Airway & Oxygen Therapy: Patient Spontanous Breathing  Post-op Assessment: Report given to RN and Post -op Vital signs reviewed and stable  Post vital signs: Reviewed and stable  Last Vitals:  Vitals Value Taken Time  BP    Temp    Pulse    Resp 16 02/09/20 0904  SpO2    Vitals shown include unvalidated device data.  Last Pain:  Vitals:   02/09/20 0651  TempSrc:   PainSc: 0-No pain      Patients Stated Pain Goal: 3 (97/67/34 1937)  Complications: No complications documented.

## 2020-02-10 ENCOUNTER — Encounter (HOSPITAL_COMMUNITY): Payer: Self-pay | Admitting: Surgery

## 2020-02-10 ENCOUNTER — Encounter (HOSPITAL_COMMUNITY)
Admission: RE | Admit: 2020-02-10 | Discharge: 2020-02-10 | Disposition: A | Discharge status: Home / Self Care | Payer: Federal, State, Local not specified - PPO | Source: Ambulatory Visit | Attending: Nephrology | Admitting: Nephrology

## 2020-02-10 VITALS — BP 134/78 | HR 80 | Temp 98.3°F | Resp 18

## 2020-02-10 DIAGNOSIS — N184 Chronic kidney disease, stage 4 (severe): Secondary | ICD-10-CM | POA: Diagnosis not present

## 2020-02-10 DIAGNOSIS — D631 Anemia in chronic kidney disease: Secondary | ICD-10-CM | POA: Insufficient documentation

## 2020-02-10 LAB — FERRITIN: Ferritin: 168 ng/mL (ref 24–336)

## 2020-02-10 LAB — IRON AND TIBC
Iron: 75 ug/dL (ref 45–182)
Saturation Ratios: 34 % (ref 17.9–39.5)
TIBC: 220 ug/dL — ABNORMAL LOW (ref 250–450)
UIBC: 145 ug/dL

## 2020-02-10 LAB — POCT HEMOGLOBIN-HEMACUE: Hemoglobin: 9.6 g/dL — ABNORMAL LOW (ref 13.0–17.0)

## 2020-02-10 MED ORDER — EPOETIN ALFA-EPBX 10000 UNIT/ML IJ SOLN
INTRAMUSCULAR | Status: AC
  Filled 2020-02-10: qty 2

## 2020-02-10 MED ORDER — EPOETIN ALFA-EPBX 10000 UNIT/ML IJ SOLN
20000.0000 [IU] | INTRAMUSCULAR | Status: DC
  Administered 2020-02-10: 20000 [IU] via SUBCUTANEOUS

## 2020-02-23 ENCOUNTER — Encounter (HOSPITAL_COMMUNITY)
Admission: RE | Admit: 2020-02-23 | Discharge: 2020-02-23 | Disposition: A | Discharge status: Home / Self Care | Payer: Federal, State, Local not specified - PPO | Source: Ambulatory Visit | Attending: Nephrology | Admitting: Nephrology

## 2020-02-23 ENCOUNTER — Other Ambulatory Visit: Payer: Self-pay

## 2020-02-23 VITALS — BP 168/104 | HR 89 | Temp 97.5°F | Resp 18

## 2020-02-23 DIAGNOSIS — N184 Chronic kidney disease, stage 4 (severe): Secondary | ICD-10-CM

## 2020-02-23 DIAGNOSIS — D631 Anemia in chronic kidney disease: Secondary | ICD-10-CM

## 2020-02-23 LAB — POCT HEMOGLOBIN-HEMACUE: Hemoglobin: 9.8 g/dL — ABNORMAL LOW (ref 13.0–17.0)

## 2020-02-23 MED ORDER — EPOETIN ALFA-EPBX 10000 UNIT/ML IJ SOLN
INTRAMUSCULAR | Status: AC
  Administered 2020-02-23: 20000 [IU] via SUBCUTANEOUS
  Filled 2020-02-23: qty 2

## 2020-02-23 MED ORDER — EPOETIN ALFA-EPBX 10000 UNIT/ML IJ SOLN: 20000.0000 [IU] | INTRAMUSCULAR | Status: DC

## 2020-03-05 DIAGNOSIS — I120 Hypertensive chronic kidney disease with stage 5 chronic kidney disease or end stage renal disease: Secondary | ICD-10-CM | POA: Diagnosis not present

## 2020-03-05 DIAGNOSIS — D631 Anemia in chronic kidney disease: Secondary | ICD-10-CM | POA: Diagnosis not present

## 2020-03-05 DIAGNOSIS — R809 Proteinuria, unspecified: Secondary | ICD-10-CM | POA: Diagnosis not present

## 2020-03-05 DIAGNOSIS — N2581 Secondary hyperparathyroidism of renal origin: Secondary | ICD-10-CM | POA: Diagnosis not present

## 2020-03-05 DIAGNOSIS — N185 Chronic kidney disease, stage 5: Secondary | ICD-10-CM | POA: Diagnosis not present

## 2020-03-09 ENCOUNTER — Other Ambulatory Visit: Payer: Self-pay

## 2020-03-09 ENCOUNTER — Encounter (HOSPITAL_COMMUNITY)
Admission: RE | Admit: 2020-03-09 | Discharge: 2020-03-09 | Disposition: A | Discharge status: Home / Self Care | Payer: Federal, State, Local not specified - PPO | Source: Ambulatory Visit | Attending: Nephrology | Admitting: Nephrology

## 2020-03-09 VITALS — BP 158/102 | HR 93 | Temp 97.2°F | Resp 18

## 2020-03-09 DIAGNOSIS — D631 Anemia in chronic kidney disease: Secondary | ICD-10-CM | POA: Diagnosis not present

## 2020-03-09 DIAGNOSIS — N184 Chronic kidney disease, stage 4 (severe): Secondary | ICD-10-CM | POA: Diagnosis not present

## 2020-03-09 LAB — FERRITIN: Ferritin: 310 ng/mL (ref 24–336)

## 2020-03-09 LAB — POCT HEMOGLOBIN-HEMACUE: Hemoglobin: 9.1 g/dL — ABNORMAL LOW (ref 13.0–17.0)

## 2020-03-09 LAB — IRON AND TIBC
Iron: 64 ug/dL (ref 45–182)
Saturation Ratios: 30 % (ref 17.9–39.5)
TIBC: 214 ug/dL — ABNORMAL LOW (ref 250–450)
UIBC: 150 ug/dL

## 2020-03-09 MED ORDER — EPOETIN ALFA-EPBX 40000 UNIT/ML IJ SOLN: 25000.0000 [IU] | INTRAMUSCULAR | Status: DC

## 2020-03-09 MED ORDER — EPOETIN ALFA-EPBX 10000 UNIT/ML IJ SOLN
INTRAMUSCULAR | Status: AC
  Administered 2020-03-09: 20000 [IU] via SUBCUTANEOUS
  Filled 2020-03-09: qty 2

## 2020-03-09 MED ORDER — EPOETIN ALFA-EPBX 3000 UNIT/ML IJ SOLN
INTRAMUSCULAR | Status: AC
  Administered 2020-03-09: 3000 [IU] via SUBCUTANEOUS
  Filled 2020-03-09: qty 1

## 2020-03-09 MED ORDER — EPOETIN ALFA-EPBX 2000 UNIT/ML IJ SOLN
INTRAMUSCULAR | Status: AC
  Administered 2020-03-09: 2000 [IU] via SUBCUTANEOUS
  Filled 2020-03-09: qty 1

## 2020-03-14 ENCOUNTER — Other Ambulatory Visit: Payer: Self-pay | Admitting: *Deleted

## 2020-03-14 DIAGNOSIS — N184 Chronic kidney disease, stage 4 (severe): Secondary | ICD-10-CM

## 2020-03-21 ENCOUNTER — Ambulatory Visit: Payer: Federal, State, Local not specified - PPO | Admitting: Registered Nurse

## 2020-03-21 ENCOUNTER — Other Ambulatory Visit: Payer: Self-pay

## 2020-03-21 ENCOUNTER — Encounter: Payer: Self-pay | Admitting: Registered Nurse

## 2020-03-21 VITALS — BP 201/116 | HR 100 | Temp 98.7°F | Resp 18 | Ht 69.0 in | Wt 291.4 lb

## 2020-03-21 DIAGNOSIS — M10372 Gout due to renal impairment, left ankle and foot: Secondary | ICD-10-CM

## 2020-03-21 DIAGNOSIS — R600 Localized edema: Secondary | ICD-10-CM

## 2020-03-21 MED ORDER — OXYCODONE-ACETAMINOPHEN 5-325 MG PO TABS: 1.0000 | ORAL_TABLET | Freq: Three times a day (TID) | ORAL | 0 refills | Status: DC | PRN

## 2020-03-21 NOTE — Patient Instructions (Addendum)
If you have lab work done today you will be contacted with your lab results within the next 2 weeks.  If you have not heard from [place] then please contact us. The fastest way to get your results is to register for My Chart.   IF you received an x-ray today, you will receive an invoice from [facility]. Please contact [facility] at [phone] with questions or concerns regarding your invoice.   IF you received labwork today, you will receive an invoice from [facility]. Please contact [facility] at [phone] with questions or concerns regarding your invoice.   Our billing staff will not be able to assist you with questions regarding bills from these companies.  You will be contacted with the lab results as soon as they are available. The fastest way to get your results is to activate your My Chart account. Instructions are located on the last page of this paperwork. If you have not heard from [place] regarding the results in 2 weeks, please contact this office.      Gout  Gout is a condition that causes painful swelling of the joints. Gout is a type of inflammation of the joints (arthritis). This condition is caused by having too much uric acid in the body. Uric acid is a chemical that forms when the body breaks down substances called purines. Purines are important for building body proteins. When the body has too much uric acid, sharp crystals can form and build up inside the joints. This causes pain and swelling. Gout attacks can happen quickly and may be very painful (acute gout). Over time, the attacks can affect more joints and become more frequent (chronic gout). Gout can also cause uric acid to build up under the skin and inside the kidneys. What are the causes? This condition is caused by too much uric acid in your blood. This can happen because:  Your kidneys do not remove enough uric acid from your blood. This is the most common cause.  Your body makes too much uric  acid. This can happen with some cancers and cancer treatments. It can also occur if your body is breaking down too many red blood cells (hemolytic anemia).  You eat too many foods that are high in purines. These foods include organ meats and some seafood. Alcohol, especially beer, is also high in purines. A gout attack may be triggered by trauma or stress. What increases the risk? You are more likely to develop this condition if you:  Have a family history of gout.  Are male and middle-aged.  Are male and have gone through menopause.  Are obese.  Frequently drink alcohol, especially beer.  Are dehydrated.  Lose weight too quickly.  Have an organ transplant.  Have lead poisoning.  Take certain medicines, including aspirin, cyclosporine, diuretics, levodopa, and niacin.  Have kidney disease.  Have a skin condition called psoriasis. What are the signs or symptoms? An attack of acute gout happens quickly. It usually occurs in just one joint. The most common place is the big toe. Attacks often start at night. Other joints that may be affected include joints of the feet, ankle, knee, fingers, wrist, or elbow. Symptoms of this condition may include:  Severe pain.  Warmth.  Swelling.  Stiffness.  Tenderness. The affected joint may be very painful to touch.  Shiny, red, or purple skin.  Chills and fever. Chronic gout may cause symptoms more frequently. More joints may be involved. You may also have  white or yellow lumps (tophi) on your hands or feet or in other areas near your joints.   How is this diagnosed? This condition is diagnosed based on your symptoms, medical history, and physical exam. You may have tests, such as:  Blood tests to measure uric acid levels.  Removal of joint fluid with a thin needle (aspiration) to look for uric acid crystals.  X-rays to look for joint damage. How is this treated? Treatment for this condition has two phases: treating an acute  attack and preventing future attacks. Acute gout treatment may include medicines to reduce pain and swelling, including:  NSAIDs.  Steroids. These are strong anti-inflammatory medicines that can be taken by mouth (orally) or injected into a joint.  Colchicine. This medicine relieves pain and swelling when it is taken soon after an attack. It can be given by mouth or through an IV. Preventive treatment may include:  Daily use of smaller doses of NSAIDs or colchicine.  Use of a medicine that reduces uric acid levels in your blood.  Changes to your diet. You may need to see a dietitian about what to eat and drink to prevent gout. Follow these instructions at home: During a gout attack  If directed, put ice on the affected area: ? Put ice in a plastic bag. ? Place a towel between your skin and the bag. ? Leave the ice on for 20 minutes, 2-3 times a day.  Raise (elevate) the affected joint above the level of your heart as often as possible.  Rest the joint as much as possible. If the affected joint is in your leg, you may be given crutches to use.  Follow instructions from your health care provider about eating or drinking restrictions.   Avoiding future gout attacks  Follow a low-purine diet as told by your dietitian or health care provider. Avoid foods and drinks that are high in purines, including liver, kidney, anchovies, asparagus, herring, mushrooms, mussels, and beer.  Maintain a healthy weight or lose weight if you are overweight. If you want to lose weight, talk with your health care provider. It is important that you do not lose weight too quickly.  Start or maintain an exercise program as told by your health care provider. Eating and drinking  Drink enough fluids to keep your urine pale yellow.  If you drink alcohol: ? Limit how much you use to:  0-1 drink a day for women.  0-2 drinks a day for men. ? Be aware of how much alcohol is in your drink. In the [place] one  drink equals one 12 oz bottle of beer (355 mL) one 5 oz glass of wine (148 mL), or one 1 oz glass of hard liquor (44 mL). General instructions  Take over-the-counter and prescription medicines only as told by your health care provider.  Do not drive or use heavy machinery while taking prescription pain medicine.  Return to your normal activities as told by your health care provider. Ask your health care provider what activities are safe for you.  Keep all follow-up visits as told by your health care provider. This is important. Contact a health care provider if you have:  Another gout attack.  Continuing symptoms of a gout attack after 10 days of treatment.  Side effects from your medicines.  Chills or a fever.  Burning pain when you urinate.  Pain in your lower back or belly. Get help right away if you:  Have severe or uncontrolled pain.  Cannot urinate. Summary  Gout is painful swelling of the joints caused by inflammation.  The most common site of pain is the big toe, but it can affect other joints in the body.  Medicines and dietary changes can help to prevent and treat gout attacks. This information is not intended to replace advice given to you by your health care provider. Make sure you discuss any questions you have with your health care provider. Document Revised: 09/09/2017 Document Reviewed: 09/09/2017 Elsevier Patient Education  2021 [place] A low-purine eating plan involves making food choices to limit your intake of purine. Purine is a kind of uric acid. Too much uric acid in your blood can cause certain conditions, such as gout and kidney stones. Eating a low-purine diet can help control these conditions. What are tips for following this plan? Reading food labels  Avoid foods with saturated or Trans fat.  Check the ingredient list of grains-based foods, such as bread and cereal, to make sure that they contain whole  grains.  Check the ingredient list of sauces or soups to make sure they do not contain meat or fish.  When choosing soft drinks, check the ingredient list to make sure they do not contain high-fructose corn syrup. Shopping  Buy plenty of fresh fruits and vegetables.  Avoid buying canned or fresh fish.  Buy dairy products labeled as low-fat or nonfat.  Avoid buying premade or processed foods. These foods are often high in fat, salt (sodium), and added sugar.   Cooking  Use olive oil instead of butter when cooking. Oils like olive oil, canola oil, and sunflower oil contain healthy fats. Meal planning  Learn which foods do or do not affect you. If you find out that a food tends to cause your gout symptoms to flare up, avoid eating that food. You can enjoy foods that do not cause problems. If you have any questions about a food item, talk with your dietitian or health care provider.  Limit foods high in fat, especially saturated fat. Fat makes it harder for your body to get rid of uric acid.  Choose foods that are lower in fat and are lean sources of protein. General guidelines  Limit alcohol intake to no more than 1 drink a day for nonpregnant women and 2 drinks a day for men. One drink equals 12 oz of beer, 5 oz of wine, or 1 oz of hard liquor. Alcohol can affect the way your body gets rid of uric acid.  Drink plenty of water to keep your urine clear or pale yellow. Fluids can help remove uric acid from your body.  If directed by your health care provider, take a vitamin C supplement.  Work with your health care provider and dietitian to develop a plan to achieve or maintain a healthy weight. Losing weight can help reduce uric acid in your blood. What foods are recommended? The items listed may not be a complete list. Talk with your dietitian about what dietary choices are best for you. Foods low in purines Foods low in purines do not need to be limited. These include:  All  fruits.  All low-purine vegetables, pickles, and olives.  Breads, pasta, rice, cornbread, and popcorn. Cake and other baked goods.  All dairy foods.  Eggs, nuts, and nut butters.  Spices and condiments, such as salt, herbs, and vinegar.  Plant oils, butter, and margarine.  Water, sugar-free soft drinks, tea, coffee, and cocoa.  Vegetable-based soups,  broths, sauces, and gravies. Foods moderate in purines Foods moderate in purines should be limited to the amounts listed.   cup of asparagus, cauliflower, spinach, mushrooms, or green peas, each day.  2/3 cup uncooked oatmeal, each day.   cup dry wheat bran or wheat germ, each day.  2-3 ounces of meat or poultry, each day.  4-6 ounces of shellfish, such as crab, lobster, oysters, or shrimp, each day.  1 cup cooked beans, peas, or lentils, each day.  Soup, broths, or bouillon made from meat or fish. Limit these foods as much as possible. What foods are not recommended? The items listed may not be a complete list. Talk with your dietitian about what dietary choices are best for you. Limit your intake of foods high in purines, including:  Beer and other alcohol.  Meat-based gravy or sauce.  Canned or fresh fish, such as: ? Anchovies, sardines, herring, and tuna. ? Mussels and scallops. ? Codfish, trout, and haddock.  Berniece Salines.  Organ meats, such as: ? Liver or kidney. ? Tripe. ? Sweetbreads (thymus gland or pancreas).  [employer] biochemist.  Yeast or yeast extract supplements.  Drinks sweetened with high-fructose corn syrup. Summary  Eating a low-purine diet can help control conditions caused by too much uric acid in the body, such as gout or kidney stones.  Choose low-purine foods, limit alcohol, and limit foods high in fat.  You will learn over time which foods do or do not affect you. If you find out that a food tends to cause your gout symptoms to flare up, avoid eating that food. This information is not  intended to replace advice given to you by your health care provider. Make sure you discuss any questions you have with your health care provider. Document Revised: 06/02/2019 Document Reviewed: 06/02/2019 Elsevier Patient Education  2021 [place].  Uric Acid Nephropathy  Uric acid is a chemical compound that is made when your body digests some kinds of food and also when your body breaks down dead cells. It is a waste product that is normally removed from your body by your kidneys. If you have too much uric acid in your blood, it can build up in your kidneys and cause damage (nephropathy). There are two types of uric acid nephropathy:  Sudden (acute) uric acid nephropathy results from a sudden buildup of uric acid.  Long-term (chronic) uric acid nephropathy results from a slow buildup of uric acid over a long period of time. What are the causes? The exact cause of this condition may depend on the type of uric acid nephropathy:  Acute uric acid nephropathy may be caused by: ? Receiving medicines for the treatment of cancer (chemotherapy). Use of these medicines causes rapid breakdown of cells. The cell breakdown produces excess uric acid. As uric acid builds up in your kidneys, it causes an increase of pressure and a loss of blood supply. This makes your kidneys less able to filter blood and make urine. ? A tumor (cancer) in the body. ? Seizures. ? Taking medicines that can cause excess uric acid. Examples are aspirin, water pills (diuretics), and medicines that are prescribed after an organ transplant. ? Severe diarrhea, which causes fluid loss (dehydration).  Chronic uric acid nephropathy may happen if you have high levels of uric acid in your body on a regular basis. One reason you may have high levels of uric acid is gout. With gout, excess uric acid forms into crystals. These crystals can get stuck  inside joints and cause painful swelling. They may also build up in your kidneys and  cause long-term damage. What increases the risk? You may be more likely to develop this condition if you:  Are male.  Are 33 years old or older.  Have gout.  Eat a lot of foods that are high in certain natural chemical compounds (purines). Shellfish and red meat contain a lot of purines.  Drink alcohol.  Have recently had heart surgery. What are the signs or symptoms? Signs and symptoms depend on the type of nephropathy that you have. They may include:  Decreased urine output.  Nausea and vomiting.  Lack of energy.  Seizures.  Blood-tinged urine.  Pain when passing urine.  Pain in the sides of the lower back (flank pain). In some cases, there are no symptoms. How is this diagnosed? Your health care provider may suspect uric acid nephropathy from your signs and symptoms, especially if you have gout. He or she may:  Do a physical exam.  Order tests to confirm the diagnosis. Tests may include: ? Blood and urine tests. This is the best way to measure high levels of uric acid. ? Imaging studies to check for kidney stones or kidney damage. These may include:  X-rays.  Ultrasound.  CT scan.  MRI. How is this treated? The goal of treatment is to lower the level of uric acid in your body and prevent kidney damage. This can be done by:  Taking medicines that block the production of uric acid. The most commonly used medicine is allopurinol. If you are starting chemotherapy, ask your health care provider if you should start taking a medicine to prevent high uric acid.  Starting a diet plan that lowers your intake of purines. Work with a diet and nutrition specialist (dietitian) to limit your intake of foods and drinks that increase uric acid.  Preventing uric acid buildup. Drink plenty of water to maintain a good flow of urine and to lower the acidity of your urine. You may also need to take a medicine called bicarbonate.  Resting the kidneys. This can be done by using a  machine to clean your blood (hemodialysis), if necessary. In hemodialysis, your blood is removed, passed through a filtering machine, and then returned to your body. Several sessions of hemodialysis usually improve kidney function by removing uric acid. Follow these instructions at home: Eating and drinking  Drink enough fluid to keep your urine pale yellow.  Do not drink alcohol.  Do not drink beverages that contain a type of sugar called fructose.  Limit how much red meat and shellfish you eat.  Include plenty of low-fat dairy foods in your diet.      General instructions  Take over-the-counter and prescription medicines only as told by your health care provider.  Maintain a healthy weight. Lose weight as directed by your health care provider.  Keep all follow-up visits as told by your health care provider. This is important. Contact a health care provider if you:  Feel tired and have low energy, even when you get enough sleep.  Have pain when passing urine.  Have nausea or vomiting. Get help right away if you:  Produce very little urine, even when you drink enough fluids.  Have blood in your urine.  Have a seizure. Summary  Uric acid is a waste product that is normally removed from your body by your kidneys. If you have too much uric acid in your blood, it can build up  in your kidneys and cause damage (nephropathy).  Sudden (acute) uric acid nephropathy results from a sudden buildup of uric acid. Long-term (chronic) uric acid nephropathy results from a slow buildup of uric acid over a long period of time. This may happen if you have gout.  The goal of treatment is to lower the level of uric acid in your body and prevent kidney damage. This information is not intended to replace advice given to you by your health care provider. Make sure you discuss any questions you have with your health care provider. Document Revised: 06/08/2018 Document Reviewed: 03/10/2017 Elsevier  Patient Education  2021 [employer].

## 2020-03-22 ENCOUNTER — Telehealth: Payer: Self-pay | Admitting: Registered Nurse

## 2020-03-22 LAB — URIC ACID: Uric Acid: 8 mg/dL (ref 3.8–8.4)

## 2020-03-22 LAB — BASIC METABOLIC PANEL
BUN/Creatinine Ratio: 7 — ABNORMAL LOW (ref 9–20)
BUN: 104 mg/dL (ref 6–24)
CO2: 17 mmol/L — ABNORMAL LOW (ref 20–29)
Calcium: 6.5 mg/dL — CL (ref 8.7–10.2)
Chloride: 113 mmol/L — ABNORMAL HIGH (ref 96–106)
Creatinine, Ser: 14.74 mg/dL (ref 0.76–1.27)
GFR calc Af Amer: 4 mL/min/{1.73_m2} — ABNORMAL LOW (ref >59)
GFR calc non Af Amer: 3 mL/min/{1.73_m2} — ABNORMAL LOW (ref >59)
Glucose: 74 mg/dL (ref 65–99)
Potassium: 5.5 mmol/L — ABNORMAL HIGH (ref 3.5–5.2)
Sodium: 148 mmol/L — ABNORMAL HIGH (ref 134–144)

## 2020-03-22 LAB — D-DIMER, QUANTITATIVE: D-DIMER: 3.59 mg/L FEU — ABNORMAL HIGH (ref 0.00–0.49)

## 2020-03-22 NOTE — Telephone Encounter (Signed)
KAREN FROM [facility] CALLING TO REPORT [facility] LAB VALUES ON THIS PATIENT    CALCIUM 6.5 CH  CREATININE 14.75 [place]

## 2020-03-22 NOTE — Telephone Encounter (Signed)
Hector Brooks FROM [facility] CALLING TO REPORT [facility] LAB VALUES ON THIS PATIENT     CALCIUM 6.5 CH  CREATININE 14.75 [place]

## 2020-03-23 ENCOUNTER — Inpatient Hospital Stay (HOSPITAL_COMMUNITY): Admission: RE | Admit: 2020-03-23 | Payer: Federal, State, Local not specified - PPO | Source: Ambulatory Visit

## 2020-03-26 ENCOUNTER — Encounter (HOSPITAL_COMMUNITY): Payer: Federal, State, Local not specified - PPO

## 2020-03-27 ENCOUNTER — Telehealth: Payer: Self-pay | Admitting: Registered Nurse

## 2020-03-27 NOTE — Telephone Encounter (Signed)
03/27/2020 - PATIENT SAW RICH MORROW ON 03/21/2020 FOR GOUT IN HIS (L) FOOT. RICH PUT HIM OUT OF WORK LAST WEEK AND HE TRIED TO RETURN ON Monday (03/26/2020). HE WAS ABLE TO STAY ALL DAY BUT THE PAIN WAS REALLY BAD. HE NEEDS TO GET A NEW NOTE BEING OUT OF WORK FROM Tuesday 03/27/2020 AND RETURNING ON MONDAY 04/02/2020. PLEASE CALL WHEN IT IS READY TO BE PICKED UP.  BEST PHONE [phone] (CELL) [place]

## 2020-03-28 ENCOUNTER — Other Ambulatory Visit: Payer: Self-pay | Admitting: Registered Nurse

## 2020-03-28 DIAGNOSIS — R7989 Other specified abnormal findings of blood chemistry: Secondary | ICD-10-CM

## 2020-03-28 NOTE — Telephone Encounter (Signed)
Fine by me to write that note Thank you  Denice Paradise

## 2020-03-28 NOTE — Telephone Encounter (Signed)
Pt called back and told pt message below pt stated he will be up today at some point.

## 2020-03-28 NOTE — Telephone Encounter (Signed)
Pt was here on 03/21/20 for Gout problem and he states that he is still needing to be out of work for at least another week. Please advise if that is appropriate.

## 2020-03-28 NOTE — Telephone Encounter (Signed)
I have written the letter and the provider has signed it. I have placed it up front for pt pick up.   I have attempted to call pt and there was no answer. Left message to call back.

## 2020-03-28 NOTE — Progress Notes (Signed)
If we could call patient - D-dimer came back a little higher than I hoped. I will put an order in for an ultrasound of that leg. He will get a call hopefully today  Thanks,  Denice Paradise

## 2020-04-02 ENCOUNTER — Other Ambulatory Visit: Payer: Self-pay | Admitting: Registered Nurse

## 2020-04-02 ENCOUNTER — Ambulatory Visit (HOSPITAL_COMMUNITY): Payer: Federal, State, Local not specified - PPO

## 2020-04-02 DIAGNOSIS — R600 Localized edema: Secondary | ICD-10-CM

## 2020-04-02 DIAGNOSIS — R7989 Other specified abnormal findings of blood chemistry: Secondary | ICD-10-CM

## 2020-04-02 NOTE — Progress Notes (Signed)
I have gotten a appointment for the pt for today at the Heart and Vascular center at [facility] at 2 pm today. I have called pt and relayed the message and the pt stated understanding, however he says he is at work and unable to make that appt. He stated that he will call them to get it rescheduled and I have given him the phone number to do so. [phone].

## 2020-04-03 ENCOUNTER — Telehealth: Payer: Self-pay | Admitting: Emergency Medicine

## 2020-04-03 ENCOUNTER — Other Ambulatory Visit: Payer: Self-pay

## 2020-04-03 ENCOUNTER — Other Ambulatory Visit: Payer: Self-pay | Admitting: Registered Nurse

## 2020-04-03 ENCOUNTER — Ambulatory Visit (HOSPITAL_COMMUNITY)
Admission: RE | Admit: 2020-04-03 | Discharge: 2020-04-03 | Disposition: A | Discharge status: Home / Self Care | Payer: Federal, State, Local not specified - PPO | Source: Ambulatory Visit | Attending: Registered Nurse | Admitting: Registered Nurse

## 2020-04-03 DIAGNOSIS — I825Y1 Chronic embolism and thrombosis of unspecified deep veins of right proximal lower extremity: Secondary | ICD-10-CM

## 2020-04-03 DIAGNOSIS — R600 Localized edema: Secondary | ICD-10-CM | POA: Insufficient documentation

## 2020-04-03 DIAGNOSIS — R7989 Other specified abnormal findings of blood chemistry: Secondary | ICD-10-CM | POA: Insufficient documentation

## 2020-04-03 NOTE — Telephone Encounter (Signed)
Vascular center call with the results of patients venus doppler US. 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 Vascular center was informed that Hector Brooks will call the patient with further instruction.

## 2020-04-03 NOTE — Progress Notes (Signed)
Lower extremity venous bilateral study completed.  Preliminary results relayed to Solmon Ice, nurse for Orland Mustard, NP.   See CV Proc for preliminary results report.   Darlin Coco, RDMS

## 2020-04-06 ENCOUNTER — Other Ambulatory Visit: Payer: Self-pay

## 2020-04-06 ENCOUNTER — Encounter (HOSPITAL_COMMUNITY)
Admission: RE | Admit: 2020-04-06 | Discharge: 2020-04-06 | Disposition: A | Discharge status: Home / Self Care | Payer: Federal, State, Local not specified - PPO | Source: Ambulatory Visit | Attending: Nephrology | Admitting: Nephrology

## 2020-04-06 ENCOUNTER — Encounter (HOSPITAL_COMMUNITY): Payer: Federal, State, Local not specified - PPO

## 2020-04-06 VITALS — BP 156/101 | HR 101

## 2020-04-06 DIAGNOSIS — D631 Anemia in chronic kidney disease: Secondary | ICD-10-CM | POA: Insufficient documentation

## 2020-04-06 DIAGNOSIS — N184 Chronic kidney disease, stage 4 (severe): Secondary | ICD-10-CM | POA: Diagnosis not present

## 2020-04-06 LAB — FERRITIN: Ferritin: 235 ng/mL (ref 24–336)

## 2020-04-06 LAB — POCT HEMOGLOBIN-HEMACUE: Hemoglobin: 7.6 g/dL — ABNORMAL LOW (ref 13.0–17.0)

## 2020-04-06 LAB — IRON AND TIBC
Iron: 63 ug/dL (ref 45–182)
Saturation Ratios: 30 % (ref 17.9–39.5)
TIBC: 209 ug/dL — ABNORMAL LOW (ref 250–450)
UIBC: 146 ug/dL

## 2020-04-06 MED ORDER — EPOETIN ALFA-EPBX 40000 UNIT/ML IJ SOLN
30000.0000 [IU] | Freq: Once | INTRAMUSCULAR | Status: AC
  Administered 2020-04-06: 30000 [IU] via SUBCUTANEOUS

## 2020-04-06 MED ORDER — EPOETIN ALFA-EPBX 40000 UNIT/ML IJ SOLN
INTRAMUSCULAR | Status: AC
  Filled 2020-04-06: qty 1

## 2020-04-06 MED ORDER — EPOETIN ALFA-EPBX 40000 UNIT/ML IJ SOLN: 25000.0000 [IU] | INTRAMUSCULAR | Status: DC

## 2020-04-06 NOTE — Progress Notes (Signed)
Hemocue today 7.6, verified result twice.  Pt missed last appointment because he had gout and couldn't walk.  Pt denies seeing any bleeding, denies chest pain, denies being short of breath.  Reported the above to Angie at [facility] and awaiting return call.

## 2020-04-06 NOTE — Progress Notes (Signed)
Orders received to increase retacrit to 30000 units per amber at [facility]

## 2020-04-10 ENCOUNTER — Encounter: Payer: Self-pay | Admitting: Registered Nurse

## 2020-04-10 NOTE — Progress Notes (Signed)
Acute Office Visit  Subjective:    Patient ID: Hector Brooks, male    DOB: [birth year], 50 y.o.   MRN: [id number]  Chief Complaint  Patient presents with  . Foot Swelling    Patient states since Saturday he has had some left foot swelling    HPI Patient is in today for L foot swelling  Ongoing since Saturday Feels like gout flare - hot swollen first mtp joint, pain mostly limited to this joint Pt does have CKD stage 4 - looks like progressing ESRD, pt would be more likely than most to have gout Bilateral lower extremity edema largely baseline at this time. No chest pain, shob, doe, headaches, or other symptoms.  Past Medical History:  Diagnosis Date  . Anemia   . Chronic kidney disease 01/2020   stage 4  . 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 History of torn meniscus of right knee   . HLD (hyperlipidemia)   . HTN (hypertension)   . Hx of blood clots   . Lower extremity edema   . 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 Sleep apnea    does not use cpap  . Vitamin D deficiency     Past Surgical History:  Procedure Laterality Date  . AV FISTULA PLACEMENT Left 02/09/2020   Procedure: LEFT ARM FISTULA CREATION;  Surgeon: Serafina Mitchell, MD;  Location: [place];  Service: Vascular;  Laterality: Left;  . MENISCUS REPAIR Right 12/14    Family History  Problem Relation Age of Onset  . Cancer Father   . Diabetes Father   . Drug abuse Father   . Hyperlipidemia Sister   . Diabetes Maternal Grandfather   . Heart disease Maternal Grandfather     Social History   Socioeconomic History  . Marital status: Married    Spouse name: Not on file  . Number of children: Not on file  . Years of education: Not on file  . Highest education level: Not on file  Occupational History  . Not on file  Tobacco Use  . Smoking status: Never Smoker  . Smokeless tobacco: Never Used  Vaping Use  . Vaping Use: Never used  Substance and Sexual Activity   . Alcohol use: No    Alcohol/week: 0.0 standard drinks  . Drug use: No  . Sexual activity: Yes  Other Topics Concern  . Not on file  Social History Narrative  . Not on file   Social Determinants of Health   Financial Resource Strain: Not on file  Food Insecurity: Not on file  Transportation Needs: Not on file  Physical Activity: Not on file  Stress: Not on file  Social Connections: Not on file  Intimate Partner Violence: Not on file    Outpatient Medications Prior to Visit  Medication Sig Dispense Refill  . amLODipine (NORVASC) 10 MG tablet Take 10 mg by mouth daily.    Marland Kitchen atorvastatin (LIPITOR) 80 MG tablet Take 0.5 tablets (40 mg total) by mouth daily. 90 tablet 3  . calcitRIOL (ROCALTROL) 0.25 MCG capsule Take 0.25 mcg by mouth daily.    . Calcium Acetate 667 MG TABS Take 2 tablets by mouth in the morning, at noon, and at bedtime.    . FEROSUL 325 (65 Fe) MG tablet Take 325 mg by mouth daily.    . furosemide (LASIX) 40 MG tablet Take 1 tablet (40 mg total) by mouth daily. 90 tablet 1  . hydrALAZINE (APRESOLINE) 50 MG  tablet Take 1 tablet (50 mg total) by mouth 3 (three) times daily. 270 tablet 3  . labetalol (NORMODYNE) 300 MG tablet Take 1 tablet (300 mg total) by mouth 2 (two) times daily. 180 tablet 3  . sodium bicarbonate 650 MG tablet Take 650 mg by mouth 2 (two) times daily.    Marland Kitchen oxyCODONE-acetaminophen (PERCOCET) 5-325 MG tablet Take 1 tablet by mouth every 6 (six) hours as needed. (Patient not taking: Reported on 03/21/2020) 8 tablet 0   No facility-administered medications prior to visit.    No Known Allergies  Review of Systems Per hpi      Objective:    Physical Exam Vitals and nursing note reviewed.  Constitutional:      Appearance: Normal appearance.  Cardiovascular:     Rate and Rhythm: Normal rate and regular rhythm.     Pulses: Normal pulses.     Heart sounds: Normal heart sounds. No murmur heard. No friction rub. No gallop.   Pulmonary:      Effort: Pulmonary effort is normal. No respiratory distress.     Breath sounds: Normal breath sounds. No stridor. No wheezing, rhonchi or rales.  Musculoskeletal:        General: Swelling and tenderness present. No deformity or signs of injury. Normal range of motion.     Right lower leg: Edema present.     Left lower leg: Edema present.  Skin:    General: Skin is warm and dry.     Findings: Erythema present.  Neurological:     General: No focal deficit present.     Mental Status: He is alert. Mental status is at baseline.  Psychiatric:        Mood and Affect: Mood normal.        Behavior: Behavior normal.        Thought Content: Thought content normal.        Judgment: Judgment normal.     BP (!) 201/116   Pulse 100   Temp 98.7 F (37.1 C) (Temporal)   Resp 18   Ht 5\' 9"  (1.753 m)   Wt 291 lb 7.2 oz (132.2 kg)   SpO2 97%   BMI 43.04 kg/m  Wt Readings from Last 3 Encounters:  03/21/20 291 lb 7.2 oz (132.2 kg)  02/09/20 291 lb (132 kg)  01/09/20 (!) 301 lb (136.5 kg)    Health Maintenance Due  Topic Date Due  . COVID-19 Vaccine (2 - Pfizer 3-dose series) 09/16/2019    There are no preventive care reminders to display for this patient.   Lab Results  Component Value Date   TSH 3.280 08/16/2019   Lab Results  Component Value Date   WBC 3.1 (L) 12/12/2019   HGB 7.6 (L) 04/06/2020   HCT 31.0 (L) 02/09/2020   MCV 92 12/12/2019   PLT 211 12/12/2019   Lab Results  Component Value Date   NA 148 (H) 03/21/2020   K 5.5 (H) 03/21/2020   CO2 17 (L) 03/21/2020   GLUCOSE 74 03/21/2020   BUN 104 (HH) 03/21/2020   CREATININE 14.74 (HH) 03/21/2020   BILITOT <0.2 12/12/2019   ALKPHOS 60 12/12/2019   AST 10 12/12/2019   ALT 13 12/12/2019   PROT 5.2 (L) 12/12/2019   ALBUMIN 2.9 (L) 12/12/2019   CALCIUM 6.5 (LL) 03/21/2020   Lab Results  Component Value Date   CHOL 162 08/16/2019   Lab Results  Component Value Date   HDL 54 08/16/2019   Lab Results  Component Value Date   LDLCALC 93 08/16/2019   Lab Results  Component Value Date   TRIG 78 08/16/2019   Lab Results  Component Value Date   CHOLHDL 3.0 08/16/2019   Lab Results  Component Value Date   HGBA1C 5.4 08/16/2019       Assessment & Plan:   Problem List Items Addressed This Visit   None   Visit Diagnoses    Acute gout due to renal impairment involving toe of left foot    -  Primary   Relevant Medications   oxyCODONE-acetaminophen (PERCOCET) 5-325 MG tablet   Other Relevant Orders   Basic Metabolic Panel (Completed)   Uric Acid (Completed)   D-dimer, quantitative (not at [facility]) (Completed)   Leg edema       Relevant Orders   Basic Metabolic Panel (Completed)   Uric Acid (Completed)   D-dimer, quantitative (not at [facility]) (Completed)       Meds ordered this encounter  Medications  . oxyCODONE-acetaminophen (PERCOCET) 5-325 MG tablet    Sig: Take 1 tablet by mouth every 8 (eight) hours as needed.    Dispense:  15 tablet    Refill:  0    Order Specific Question:   Supervising Provider    Answer:   Carlota Raspberry, Cayle R [[id number]]   PLAN  Suspect gouty arthritis but will draw D-dimer to check elevation  Oxycodone-acetominophen for pain relief. Options limited due to his renal function  Return prn. Continue to follow with vascular and nephrology  Patient encouraged to call clinic with any questions, comments, or concerns.   Maximiano Coss, NP

## 2020-04-16 ENCOUNTER — Other Ambulatory Visit: Payer: Self-pay | Admitting: Registered Nurse

## 2020-04-16 DIAGNOSIS — I10 Essential (primary) hypertension: Secondary | ICD-10-CM

## 2020-04-17 ENCOUNTER — Encounter (HOSPITAL_COMMUNITY)
Admission: RE | Admit: 2020-04-17 | Discharge: 2020-04-17 | Disposition: A | Discharge status: Home / Self Care | Payer: Federal, State, Local not specified - PPO | Source: Ambulatory Visit | Attending: Nephrology | Admitting: Nephrology

## 2020-04-17 ENCOUNTER — Other Ambulatory Visit: Payer: Self-pay

## 2020-04-17 VITALS — BP 150/95 | HR 86 | Temp 98.0°F | Resp 18

## 2020-04-17 DIAGNOSIS — N184 Chronic kidney disease, stage 4 (severe): Secondary | ICD-10-CM | POA: Diagnosis not present

## 2020-04-17 DIAGNOSIS — D631 Anemia in chronic kidney disease: Secondary | ICD-10-CM | POA: Diagnosis not present

## 2020-04-17 LAB — POCT HEMOGLOBIN-HEMACUE: Hemoglobin: 7.5 g/dL — ABNORMAL LOW (ref 13.0–17.0)

## 2020-04-17 MED ORDER — EPOETIN ALFA-EPBX 40000 UNIT/ML IJ SOLN: 30000.0000 [IU] | INTRAMUSCULAR | Status: DC

## 2020-04-17 MED ORDER — EPOETIN ALFA-EPBX 40000 UNIT/ML IJ SOLN
INTRAMUSCULAR | Status: AC
  Administered 2020-04-17: 30000 [IU] via SUBCUTANEOUS
  Filled 2020-04-17: qty 1

## 2020-04-17 NOTE — Progress Notes (Signed)
hemocue today 7.5, no symptoms per patient, called and reported hemocue result to amber at [facility].  Orders received for one time dose of IV iron. Pt unable to stay today to receive it but is coming in this Friday

## 2020-04-19 ENCOUNTER — Other Ambulatory Visit (HOSPITAL_COMMUNITY): Payer: Self-pay | Admitting: *Deleted

## 2020-04-20 ENCOUNTER — Other Ambulatory Visit: Payer: Self-pay

## 2020-04-20 ENCOUNTER — Encounter (HOSPITAL_COMMUNITY)
Admission: RE | Admit: 2020-04-20 | Discharge: 2020-04-20 | Disposition: A | Discharge status: Home / Self Care | Payer: Federal, State, Local not specified - PPO | Source: Ambulatory Visit | Attending: Nephrology | Admitting: Nephrology

## 2020-04-20 DIAGNOSIS — N184 Chronic kidney disease, stage 4 (severe): Secondary | ICD-10-CM | POA: Diagnosis not present

## 2020-04-20 DIAGNOSIS — D631 Anemia in chronic kidney disease: Secondary | ICD-10-CM | POA: Diagnosis not present

## 2020-04-20 MED ORDER — SODIUM CHLORIDE 0.9 % IV SOLN
510.0000 mg | Freq: Once | INTRAVENOUS | Status: AC
  Administered 2020-04-20: 510 mg via INTRAVENOUS
  Filled 2020-04-20: qty 510

## 2020-05-01 ENCOUNTER — Other Ambulatory Visit: Payer: Self-pay

## 2020-05-01 ENCOUNTER — Encounter (HOSPITAL_COMMUNITY)
Admission: RE | Admit: 2020-05-01 | Discharge: 2020-05-01 | Disposition: A | Discharge status: Home / Self Care | Payer: Federal, State, Local not specified - PPO | Source: Ambulatory Visit | Attending: Nephrology | Admitting: Nephrology

## 2020-05-01 VITALS — BP 169/109 | HR 98 | Resp 20

## 2020-05-01 DIAGNOSIS — D631 Anemia in chronic kidney disease: Secondary | ICD-10-CM

## 2020-05-01 DIAGNOSIS — N184 Chronic kidney disease, stage 4 (severe): Secondary | ICD-10-CM | POA: Insufficient documentation

## 2020-05-01 LAB — FERRITIN: Ferritin: 245 ng/mL (ref 24–336)

## 2020-05-01 LAB — IRON AND TIBC
Iron: 90 ug/dL (ref 45–182)
Saturation Ratios: 39 % (ref 17.9–39.5)
TIBC: 228 ug/dL — ABNORMAL LOW (ref 250–450)
UIBC: 138 ug/dL

## 2020-05-01 MED ORDER — EPOETIN ALFA-EPBX 40000 UNIT/ML IJ SOLN
30000.0000 [IU] | INTRAMUSCULAR | Status: DC
  Administered 2020-05-01: 30000 [IU] via SUBCUTANEOUS

## 2020-05-01 MED ORDER — EPOETIN ALFA-EPBX 40000 UNIT/ML IJ SOLN
INTRAMUSCULAR | Status: AC
  Filled 2020-05-01: qty 1

## 2020-05-02 LAB — POCT HEMOGLOBIN-HEMACUE: Hemoglobin: 8.6 g/dL — ABNORMAL LOW (ref 13.0–17.0)

## 2020-05-04 ENCOUNTER — Encounter (HOSPITAL_COMMUNITY): Payer: Federal, State, Local not specified - PPO

## 2020-05-08 DIAGNOSIS — N185 Chronic kidney disease, stage 5: Secondary | ICD-10-CM | POA: Diagnosis not present

## 2020-05-08 DIAGNOSIS — R809 Proteinuria, unspecified: Secondary | ICD-10-CM | POA: Diagnosis not present

## 2020-05-08 DIAGNOSIS — D631 Anemia in chronic kidney disease: Secondary | ICD-10-CM | POA: Diagnosis not present

## 2020-05-08 DIAGNOSIS — N2581 Secondary hyperparathyroidism of renal origin: Secondary | ICD-10-CM | POA: Diagnosis not present

## 2020-05-08 DIAGNOSIS — I120 Hypertensive chronic kidney disease with stage 5 chronic kidney disease or end stage renal disease: Secondary | ICD-10-CM | POA: Diagnosis not present

## 2020-05-15 ENCOUNTER — Other Ambulatory Visit: Payer: Self-pay

## 2020-05-15 ENCOUNTER — Encounter (HOSPITAL_COMMUNITY)
Admission: RE | Admit: 2020-05-15 | Discharge: 2020-05-15 | Disposition: A | Discharge status: Home / Self Care | Payer: Federal, State, Local not specified - PPO | Source: Ambulatory Visit | Attending: Nephrology | Admitting: Nephrology

## 2020-05-15 VITALS — BP 175/110 | HR 100 | Temp 98.2°F | Resp 180

## 2020-05-15 DIAGNOSIS — D631 Anemia in chronic kidney disease: Secondary | ICD-10-CM | POA: Diagnosis not present

## 2020-05-15 DIAGNOSIS — N184 Chronic kidney disease, stage 4 (severe): Secondary | ICD-10-CM

## 2020-05-15 MED ORDER — EPOETIN ALFA 40000 UNIT/ML IJ SOLN
INTRAMUSCULAR | Status: AC
  Administered 2020-05-15: 40000 [IU] via SUBCUTANEOUS
  Filled 2020-05-15: qty 1

## 2020-05-15 MED ORDER — EPOETIN ALFA-EPBX 40000 UNIT/ML IJ SOLN: 30000.0000 [IU] | INTRAMUSCULAR | Status: DC

## 2020-05-16 LAB — POCT HEMOGLOBIN-HEMACUE: Hemoglobin: 8.9 g/dL — ABNORMAL LOW (ref 13.0–17.0)

## 2020-05-18 ENCOUNTER — Ambulatory Visit: Payer: Federal, State, Local not specified - PPO | Admitting: Registered Nurse

## 2020-05-18 ENCOUNTER — Encounter: Payer: Self-pay | Admitting: Registered Nurse

## 2020-05-18 ENCOUNTER — Other Ambulatory Visit: Payer: Self-pay

## 2020-05-18 VITALS — BP 152/84 | HR 98 | Temp 98.1°F | Resp 17 | Ht 69.0 in | Wt 285.2 lb

## 2020-05-18 DIAGNOSIS — M7989 Other specified soft tissue disorders: Secondary | ICD-10-CM | POA: Diagnosis not present

## 2020-05-18 DIAGNOSIS — I825Y1 Chronic embolism and thrombosis of unspecified deep veins of right proximal lower extremity: Secondary | ICD-10-CM

## 2020-05-18 NOTE — Progress Notes (Signed)
Established Patient Office Visit  Subjective:  Patient ID: Hector Brooks, male    DOB: 08/08/1970  Age: 49 y.o. MRN: [id number]  CC:  Chief Complaint  Patient presents with  . Edema    Pt notes has had swelling in his rt leg, started about 2-3 weeks ago. Thought he had been bitten but has not improved and swelled up about 2 weeks ago and warm to the touch, does have small knot on the back of his Rt calf. History of blood clots wants to get this evaluated     HPI Hector Brooks presents for leg swelling  Ongoing 2-3 weeks. Hx of chronic dvt, but this has been worse. Area of a "knot" that is more firm, tender, and swollen appearing than other side or previously. Notes that his wife is a Marine scientist and at her behest presents today.  Notes that his renal function has continued to decline, at this time is facing dialysis over the coming weeks. He does have some concern about this and impact on ADLs, does not want to stop working.   Past Medical History:  Diagnosis Date  . Anemia   . Chronic kidney disease 01/2020   stage 4  . 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 History of torn meniscus of right knee   . HLD (hyperlipidemia)   . HTN (hypertension)   . Hx of blood clots   . Lower extremity edema   . 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 Sleep apnea    does not use cpap  . Vitamin D deficiency     Past Surgical History:  Procedure Laterality Date  . AV FISTULA PLACEMENT Left 02/09/2020   Procedure: LEFT ARM FISTULA CREATION;  Surgeon: Serafina Mitchell, MD;  Location: [place];  Service: Vascular;  Laterality: Left;  . MENISCUS REPAIR Right 12/14    Family History  Problem Relation Age of Onset  . Cancer Father   . Diabetes Father   . Drug abuse Father   . Hyperlipidemia Sister   . Diabetes Maternal Grandfather   . Heart disease Maternal Grandfather     Social History   Socioeconomic History  . Marital status: Married    Spouse  name: Not on file  . Number of children: Not on file  . Years of education: Not on file  . Highest education level: Not on file  Occupational History  . Not on file  Tobacco Use  . Smoking status: Never Smoker  . Smokeless tobacco: Never Used  Vaping Use  . Vaping Use: Never used  Substance and Sexual Activity  . Alcohol use: No    Alcohol/week: 0.0 standard drinks  . Drug use: No  . Sexual activity: Yes  Other Topics Concern  . Not on file  Social History Narrative  . Not on file   Social Determinants of Health   Financial Resource Strain: Not on file  Food Insecurity: Not on file  Transportation Needs: Not on file  Physical Activity: Not on file  Stress: Not on file  Social Connections: Not on file  Intimate Partner Violence: Not on file    Outpatient Medications Prior to Visit  Medication Sig Dispense Refill  . amLODipine (NORVASC) 10 MG tablet Take 10 mg by mouth daily.    Marland Kitchen atorvastatin (LIPITOR) 80 MG tablet Take 0.5 tablets (40 mg total) by mouth daily. 90 tablet 3  . calcitRIOL (ROCALTROL) 0.25 MCG capsule Take 0.25 mcg by mouth  daily.    . Calcium Acetate 667 MG TABS Take 2 tablets by mouth in the morning, at noon, and at bedtime.    . FEROSUL 325 (65 Fe) MG tablet Take 325 mg by mouth daily.    . furosemide (LASIX) 40 MG tablet Take 1 tablet (40 mg total) by mouth daily. 90 tablet 1  . hydrALAZINE (APRESOLINE) 50 MG tablet Take 1 tablet (50 mg total) by mouth 3 (three) times daily. 270 tablet 3  . labetalol (NORMODYNE) 300 MG tablet Take 1 tablet (300 mg total) by mouth 2 (two) times daily. 180 tablet 3  . sodium bicarbonate 650 MG tablet Take 650 mg by mouth 2 (two) times daily.    Marland Kitchen oxyCODONE-acetaminophen (PERCOCET) 5-325 MG tablet Take 1 tablet by mouth every 8 (eight) hours as needed. (Patient not taking: Reported on 05/18/2020) 15 tablet 0   No facility-administered medications prior to visit.    No Known Allergies  ROS Review of Systems   Constitutional: Negative.   HENT: Negative.   Eyes: Negative.   Respiratory: Negative.   Cardiovascular: Negative.   Gastrointestinal: Negative.   Genitourinary: Negative.   Musculoskeletal: Negative.   Skin: Negative.   Neurological: Negative.   Psychiatric/Behavioral: Negative.   All other systems reviewed and are negative.     Objective:    Physical Exam Constitutional:      General: He is not in acute distress.    Appearance: Normal appearance. He is normal weight. He is not ill-appearing, toxic-appearing or diaphoretic.  Cardiovascular:     Rate and Rhythm: Normal rate and regular rhythm.     Heart sounds: Normal heart sounds. No murmur heard. No friction rub. No gallop.   Pulmonary:     Effort: Pulmonary effort is normal. No respiratory distress.     Breath sounds: Normal breath sounds. No stridor. No wheezing, rhonchi or rales.  Chest:     Chest wall: No tenderness.  Musculoskeletal:     Right lower leg: Edema (firm lump on posterior R calf. no changes to skin but notably warm RLE compared to LLE.) present.     Left lower leg: Edema present.  Skin:    General: Skin is warm and dry.  Neurological:     General: No focal deficit present.     Mental Status: He is alert and oriented to person, place, and time. Mental status is at baseline.  Psychiatric:        Mood and Affect: Mood normal.        Behavior: Behavior normal.        Thought Content: Thought content normal.        Judgment: Judgment normal.     BP (!) 152/84   Pulse 98   Temp 98.1 F (36.7 C) (Temporal)   Resp 17   Ht 5\' 9"  (1.753 m)   Wt 285 lb 3.2 oz (129.4 kg)   SpO2 99%   BMI 42.12 kg/m  Wt Readings from Last 3 Encounters:  05/18/20 285 lb 3.2 oz (129.4 kg)  03/21/20 291 lb 7.2 oz (132.2 kg)  02/09/20 291 lb (132 kg)     There are no preventive care reminders to display for this patient.  There are no preventive care reminders to display for this patient.  Lab Results  Component  Value Date   TSH 3.280 08/16/2019   Lab Results  Component Value Date   WBC 3.1 (L) 12/12/2019   HGB 8.9 (L) 05/15/2020   HCT 31.0 (  L) 02/09/2020   MCV 92 12/12/2019   PLT 211 12/12/2019   Lab Results  Component Value Date   NA 148 (H) 03/21/2020   K 5.5 (H) 03/21/2020   CO2 17 (L) 03/21/2020   GLUCOSE 74 03/21/2020   BUN 104 (HH) 03/21/2020   CREATININE 14.74 (HH) 03/21/2020   BILITOT <0.2 12/12/2019   ALKPHOS 60 12/12/2019   AST 10 12/12/2019   ALT 13 12/12/2019   PROT 5.2 (L) 12/12/2019   ALBUMIN 2.9 (L) 12/12/2019   CALCIUM 6.5 (LL) 03/21/2020   Lab Results  Component Value Date   CHOL 162 08/16/2019   Lab Results  Component Value Date   HDL 54 08/16/2019   Lab Results  Component Value Date   LDLCALC 93 08/16/2019   Lab Results  Component Value Date   TRIG 78 08/16/2019   Lab Results  Component Value Date   CHOLHDL 3.0 08/16/2019   Lab Results  Component Value Date   HGBA1C 5.4 08/16/2019      Assessment & Plan:   Problem List Items Addressed This Visit   None   Visit Diagnoses    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   Follow-up: No follow-ups on file.   PLAN  Concern for progression of chronic DVT  Will have him go for [place] to check status  Refer to hematology for chronic DVT  Patient encouraged to call clinic with any questions, comments, or concerns.   Maximiano Coss, NP

## 2020-05-18 NOTE — Patient Instructions (Signed)
° ° ° °  If you have lab work done today you will be contacted with your lab results within the next 2 weeks.  If you have not heard from us then please contact us. The fastest way to get your results is to register for My Chart. ° ° °IF you received an x-ray today, you will receive an invoice from [facility]. Please contact [facility] at [phone] with questions or concerns regarding your invoice.  ° °IF you received labwork today, you will receive an invoice from [facility]. Please contact [facility] at [phone] with questions or concerns regarding your invoice.  ° °Our billing staff will not be able to assist you with questions regarding bills from these companies. ° °You will be contacted with the lab results as soon as they are available. The fastest way to get your results is to activate your My Chart account. Instructions are located on the last page of this paperwork. If you have not heard from us regarding the results in 2 weeks, please contact this office. °  ° ° ° °

## 2020-05-21 ENCOUNTER — Encounter (HOSPITAL_COMMUNITY): Payer: Self-pay | Admitting: Vascular Surgery

## 2020-05-21 ENCOUNTER — Other Ambulatory Visit (HOSPITAL_COMMUNITY)
Admission: RE | Admit: 2020-05-21 | Discharge: 2020-05-21 | Disposition: A | Discharge status: Home / Self Care | Payer: Federal, State, Local not specified - PPO | Source: Ambulatory Visit | Attending: Vascular Surgery | Admitting: Vascular Surgery

## 2020-05-21 ENCOUNTER — Other Ambulatory Visit: Payer: Self-pay

## 2020-05-21 DIAGNOSIS — N186 End stage renal disease: Secondary | ICD-10-CM | POA: Diagnosis not present

## 2020-05-21 DIAGNOSIS — I129 Hypertensive chronic kidney disease with stage 1 through stage 4 chronic kidney disease, or unspecified chronic kidney disease: Secondary | ICD-10-CM | POA: Diagnosis not present

## 2020-05-21 DIAGNOSIS — Z992 Dependence on renal dialysis: Secondary | ICD-10-CM | POA: Diagnosis not present

## 2020-05-21 DIAGNOSIS — Z20822 Contact with and (suspected) exposure to covid-19: Secondary | ICD-10-CM | POA: Insufficient documentation

## 2020-05-21 DIAGNOSIS — Z01812 Encounter for preprocedural laboratory examination: Secondary | ICD-10-CM | POA: Insufficient documentation

## 2020-05-21 DIAGNOSIS — N2581 Secondary hyperparathyroidism of renal origin: Secondary | ICD-10-CM | POA: Diagnosis not present

## 2020-05-21 NOTE — Progress Notes (Signed)
Received call from HD center - patient had access placed on 02/09/20 by [facility]. He did not show for post op appt and has not been seen by [facility] since placement. HD MD sent message that access could be cannulated. Nurse called, reported that immediately when stuck, "access blew up." It cannot currently be used. He is very swollen and in need of HD. Discussed with [facility] and added patient on to OR schedule tomorrow for [facility] placement.

## 2020-05-21 NOTE — Progress Notes (Shared)
Hector Brooks denies chest pain or shortness of breath. Patient was tested for Covid and has been in quarantine since that time. Hector Brooks has a history

## 2020-05-22 ENCOUNTER — Ambulatory Visit (HOSPITAL_COMMUNITY): Payer: Federal, State, Local not specified - PPO | Admitting: Anesthesiology

## 2020-05-22 ENCOUNTER — Encounter (HOSPITAL_COMMUNITY): Payer: Self-pay | Admitting: Vascular Surgery

## 2020-05-22 ENCOUNTER — Ambulatory Visit (HOSPITAL_COMMUNITY)
Admission: RE | Admit: 2020-05-22 | Discharge: 2020-05-22 | Disposition: A | Discharge status: Home / Self Care | Payer: Federal, State, Local not specified - PPO | Attending: Vascular Surgery | Admitting: Vascular Surgery

## 2020-05-22 ENCOUNTER — Ambulatory Visit (HOSPITAL_COMMUNITY): Payer: Federal, State, Local not specified - PPO

## 2020-05-22 ENCOUNTER — Other Ambulatory Visit: Payer: Self-pay

## 2020-05-22 ENCOUNTER — Encounter (HOSPITAL_COMMUNITY)
Admission: RE | Disposition: A | Discharge status: Home / Self Care | Payer: Self-pay | Source: Home / Self Care | Attending: Vascular Surgery

## 2020-05-22 DIAGNOSIS — I517 Cardiomegaly: Secondary | ICD-10-CM | POA: Diagnosis not present

## 2020-05-22 DIAGNOSIS — N185 Chronic kidney disease, stage 5: Secondary | ICD-10-CM | POA: Diagnosis not present

## 2020-05-22 DIAGNOSIS — N186 End stage renal disease: Secondary | ICD-10-CM

## 2020-05-22 DIAGNOSIS — Z20822 Contact with and (suspected) exposure to covid-19: Secondary | ICD-10-CM | POA: Diagnosis not present

## 2020-05-22 DIAGNOSIS — Z992 Dependence on renal dialysis: Secondary | ICD-10-CM | POA: Insufficient documentation

## 2020-05-22 DIAGNOSIS — Z419 Encounter for procedure for purposes other than remedying health state, unspecified: Secondary | ICD-10-CM

## 2020-05-22 DIAGNOSIS — D631 Anemia in chronic kidney disease: Secondary | ICD-10-CM | POA: Diagnosis not present

## 2020-05-22 DIAGNOSIS — I120 Hypertensive chronic kidney disease with stage 5 chronic kidney disease or end stage renal disease: Secondary | ICD-10-CM | POA: Diagnosis not present

## 2020-05-22 HISTORY — PX: INSERTION OF DIALYSIS CATHETER: SHX1324

## 2020-05-22 LAB — POCT I-STAT, CHEM 8
BUN: >130 mg/dL — ABNORMAL HIGH (ref 6–20)
Calcium, Ion: 0.89 mmol/L — CL (ref 1.15–1.40)
Chloride: 120 mmol/L — ABNORMAL HIGH (ref 98–111)
Creatinine, Ser: 14.6 mg/dL — ABNORMAL HIGH (ref 0.61–1.24)
Glucose, Bld: 87 mg/dL (ref 70–99)
HCT: 26 % — ABNORMAL LOW (ref 39.0–52.0)
Hemoglobin: 8.8 g/dL — ABNORMAL LOW (ref 13.0–17.0)
Potassium: 5.2 mmol/L — ABNORMAL HIGH (ref 3.5–5.1)
Sodium: 147 mmol/L — ABNORMAL HIGH (ref 135–145)
TCO2: 16 mmol/L — ABNORMAL LOW (ref 22–32)

## 2020-05-22 LAB — SARS CORONAVIRUS 2 (TAT 6-24 HRS): SARS Coronavirus 2: NEGATIVE

## 2020-05-22 LAB — BASIC METABOLIC PANEL
Anion gap: 11 (ref 5–15)
BUN: 129 mg/dL — ABNORMAL HIGH (ref 6–20)
CO2: 16 mmol/L — ABNORMAL LOW (ref 22–32)
Calcium: 6.8 mg/dL — ABNORMAL LOW (ref 8.9–10.3)
Chloride: 119 mmol/L — ABNORMAL HIGH (ref 98–111)
Creatinine, Ser: 12.98 mg/dL — ABNORMAL HIGH (ref 0.61–1.24)
GFR, Estimated: 4 mL/min — ABNORMAL LOW (ref >60)
Glucose, Bld: 83 mg/dL (ref 70–99)
Potassium: 5.2 mmol/L — ABNORMAL HIGH (ref 3.5–5.1)
Sodium: 146 mmol/L — ABNORMAL HIGH (ref 135–145)

## 2020-05-22 SURGERY — INSERTION OF DIALYSIS CATHETER
Anesthesia: Monitor Anesthesia Care | Site: Chest | Laterality: Right

## 2020-05-22 MED ORDER — DEXTROSE 5 % IV SOLN
3.0000 g | INTRAVENOUS | Status: AC
  Administered 2020-05-22: 3 g via INTRAVENOUS
  Filled 2020-05-22: qty 3000

## 2020-05-22 MED ORDER — PROPOFOL 10 MG/ML IV BOLUS
INTRAVENOUS | Status: DC | PRN
  Administered 2020-05-22: 40 mg via INTRAVENOUS

## 2020-05-22 MED ORDER — CHLORHEXIDINE GLUCONATE 0.12 % MT SOLN: 15.0000 mL | Freq: Once | OROMUCOSAL | Status: AC

## 2020-05-22 MED ORDER — ORAL CARE MOUTH RINSE: 15.0000 mL | Freq: Once | OROMUCOSAL | Status: AC

## 2020-05-22 MED ORDER — LIDOCAINE 2% (20 MG/ML) 5 ML SYRINGE
INTRAMUSCULAR | Status: DC | PRN
  Administered 2020-05-22: 40 mg via INTRAVENOUS

## 2020-05-22 MED ORDER — OXYCODONE HCL 5 MG PO TABS: 5.0000 mg | ORAL_TABLET | ORAL | 0 refills | Status: DC | PRN

## 2020-05-22 MED ORDER — SODIUM CHLORIDE 0.9 % IV SOLN
INTRAVENOUS | Status: DC | PRN
  Administered 2020-05-22: 500 mL

## 2020-05-22 MED ORDER — LIDOCAINE-EPINEPHRINE (PF) 1 %-1:200000 IJ SOLN
INTRAMUSCULAR | Status: DC | PRN
  Administered 2020-05-22: 15 mL

## 2020-05-22 MED ORDER — SODIUM CHLORIDE 0.9 % IV SOLN
INTRAVENOUS | Status: AC
  Filled 2020-05-22: qty 1.2

## 2020-05-22 MED ORDER — PROPOFOL 500 MG/50ML IV EMUL
INTRAVENOUS | Status: DC | PRN
  Administered 2020-05-22: 100 ug/kg/min via INTRAVENOUS

## 2020-05-22 MED ORDER — CHLORHEXIDINE GLUCONATE 4 % EX LIQD: 60.0000 mL | Freq: Once | CUTANEOUS | Status: DC

## 2020-05-22 MED ORDER — SODIUM CHLORIDE 0.9 % IV SOLN: INTRAVENOUS | Status: DC

## 2020-05-22 MED ORDER — ONDANSETRON HCL 4 MG/2ML IJ SOLN
INTRAMUSCULAR | Status: AC
  Filled 2020-05-22: qty 2

## 2020-05-22 MED ORDER — LIDOCAINE 2% (20 MG/ML) 5 ML SYRINGE
INTRAMUSCULAR | Status: AC
  Filled 2020-05-22: qty 5

## 2020-05-22 MED ORDER — ONDANSETRON HCL 4 MG/2ML IJ SOLN
INTRAMUSCULAR | Status: DC | PRN
  Administered 2020-05-22: 4 mg via INTRAVENOUS

## 2020-05-22 MED ORDER — LIDOCAINE HCL (PF) 1 % IJ SOLN
INTRAMUSCULAR | Status: AC
  Filled 2020-05-22: qty 30

## 2020-05-22 MED ORDER — HEPARIN SODIUM (PORCINE) 1000 UNIT/ML IJ SOLN
INTRAMUSCULAR | Status: DC | PRN
  Administered 2020-05-22: 3200 [IU]

## 2020-05-22 MED ORDER — HEPARIN SODIUM (PORCINE) 1000 UNIT/ML IJ SOLN
INTRAMUSCULAR | Status: AC
  Filled 2020-05-22: qty 1

## 2020-05-22 MED ORDER — CHLORHEXIDINE GLUCONATE 0.12 % MT SOLN
OROMUCOSAL | Status: AC
  Administered 2020-05-22: 15 mL via OROMUCOSAL
  Filled 2020-05-22: qty 15

## 2020-05-22 MED ORDER — 0.9 % SODIUM CHLORIDE (POUR BTL) OPTIME
TOPICAL | Status: DC | PRN
  Administered 2020-05-22: 1000 mL

## 2020-05-22 SURGICAL SUPPLY — 40 items
BAG DECANTER FOR FLEXI CONT (MISCELLANEOUS) ×2 IMPLANT
BIOPATCH RED 1 DISK 7.0 (GAUZE/BANDAGES/DRESSINGS) ×2 IMPLANT
CATH PALINDROME-P 19CM W/VT (CATHETERS) ×2 IMPLANT
CATH PALINDROME-P 23CM W/VT (CATHETERS) IMPLANT
CATH PALINDROME-P 28CM W/VT (CATHETERS) IMPLANT
CHLORAPREP W/TINT 26 (MISCELLANEOUS) ×2 IMPLANT
COVER PROBE W GEL 5X96 (DRAPES) IMPLANT
COVER SURGICAL LIGHT HANDLE (MISCELLANEOUS) ×2 IMPLANT
COVER WAND RF STERILE (DRAPES) ×2 IMPLANT
DERMABOND ADHESIVE PROPEN (GAUZE/BANDAGES/DRESSINGS) ×1
DERMABOND ADVANCED .7 DNX6 (GAUZE/BANDAGES/DRESSINGS) ×1 IMPLANT
DRAPE C-ARM 42X72 X-RAY (DRAPES) ×2 IMPLANT
DRAPE CHEST BREAST 15X10 FENES (DRAPES) ×2 IMPLANT
GAUZE 4X4 16PLY RFD (DISPOSABLE) ×2 IMPLANT
GAUZE SPONGE 4X4 12PLY STRL LF (GAUZE/BANDAGES/DRESSINGS) ×2 IMPLANT
GLOVE BIO SURGEON STRL SZ7.5 (GLOVE) ×2 IMPLANT
GLOVE SRG 8 PF TXTR STRL LF DI (GLOVE) ×1 IMPLANT
GLOVE SURG UNDER POLY LF SZ8 (GLOVE) ×2
GOWN STRL REUS W/ TWL LRG LVL3 (GOWN DISPOSABLE) ×2 IMPLANT
GOWN STRL REUS W/TWL LRG LVL3 (GOWN DISPOSABLE) ×4
KIT BASIN OR (CUSTOM PROCEDURE TRAY) ×2 IMPLANT
KIT PALINDROME-P 55CM (CATHETERS) IMPLANT
KIT TURNOVER KIT B (KITS) ×2 IMPLANT
NEEDLE 18GX1X1/2 (RX/OR ONLY) (NEEDLE) ×2 IMPLANT
NEEDLE HYPO 25GX1X1/2 BEV (NEEDLE) ×2 IMPLANT
NS IRRIG 1000ML POUR BTL (IV SOLUTION) ×2 IMPLANT
PACK SURGICAL SETUP 50X90 (CUSTOM PROCEDURE TRAY) ×2 IMPLANT
PAD ARMBOARD 7.5X6 YLW CONV (MISCELLANEOUS) ×4 IMPLANT
SET MICROPUNCTURE 5F STIFF (MISCELLANEOUS) ×2 IMPLANT
SUT ETHILON 3 0 PS 1 (SUTURE) ×2 IMPLANT
SUT VICRYL 4-0 PS2 18IN ABS (SUTURE) ×2 IMPLANT
SYR 10ML LL (SYRINGE) ×2 IMPLANT
SYR 20ML LL LF (SYRINGE) ×4 IMPLANT
SYR 5ML LL (SYRINGE) ×4 IMPLANT
SYR CONTROL 10ML LL (SYRINGE) ×2 IMPLANT
TAPE CLOTH SURG 4X10 WHT LF (GAUZE/BANDAGES/DRESSINGS) ×2 IMPLANT
TOWEL GREEN STERILE (TOWEL DISPOSABLE) ×4 IMPLANT
TOWEL GREEN STERILE FF (TOWEL DISPOSABLE) ×2 IMPLANT
WATER STERILE IRR 1000ML POUR (IV SOLUTION) ×2 IMPLANT
WIRE TORQFLEX AUST .018X40CM (WIRE) ×2 IMPLANT

## 2020-05-22 NOTE — Anesthesia Procedure Notes (Signed)
Procedure Name: MAC Date/Time: 05/22/2020 2:08 PM Performed by: Reece Agar, CRNA Pre-anesthesia Checklist: Patient identified, Emergency Drugs available, Suction available and Patient being monitored Patient Re-evaluated:Patient Re-evaluated prior to induction Oxygen Delivery Method: Simple face mask

## 2020-05-22 NOTE — Progress Notes (Signed)
Dr. Gloris Manchester notified of patient's elevated BP upon arrival to short stay.  Patient stated he took BP medications as instructed PTA.  No new orders received.  Will continue to monitor patient.

## 2020-05-22 NOTE — Progress Notes (Signed)
Dr. Tedd Sias notified of critical lab. Pt. Placed on monitor.

## 2020-05-22 NOTE — Progress Notes (Signed)
CRITICAL VALUE STICKER  CRITICAL VALUE: ica  RECEIVER (on-site recipient of call): michelle fergueson,rn  DATE & TIME NOTIFIED: 05/22/20 113  MESSENGER (representative from lab):istat done on unit  MD NOTIFIED: Dr. Ignatius Specking  TIME OF NOTIFICATION:1219  RESPONSE:  Draw bmet

## 2020-05-22 NOTE — Transfer of Care (Signed)
Immediate Anesthesia Transfer of Care Note  Patient: Hector Brooks  Procedure(s) Performed: INSERTION OF TUNNELED DIALYSIS CATHETER (Right Chest)  Patient Location: PACU  Anesthesia Type:MAC  Level of Consciousness: awake and alert   Airway & Oxygen Therapy: Patient Spontanous Breathing and Patient connected to face mask oxygen  Post-op Assessment: Report given to RN and Post -op Vital signs reviewed and stable  Post vital signs: Reviewed and stable  Last Vitals:  Vitals Value Taken Time  BP 147/75 05/22/20 1506  Temp    Pulse 85 05/22/20 1508  Resp 13 05/22/20 1508  SpO2 100 % 05/22/20 1508  Vitals shown include unvalidated device data.  Last Pain:  Vitals:   05/22/20 1210  TempSrc:   PainSc: 0-No pain      Patients Stated Pain Goal: 3 (83/15/17 6160)  Complications: No complications documented.

## 2020-05-22 NOTE — Op Note (Signed)
° ° °  NAME: Hector Brooks    MRN: [id number] DOB: May 20, 1970    DATE OF OPERATION: 05/22/2020  PREOP DIAGNOSIS:    End-stage renal disease  POSTOP DIAGNOSIS:    Same  PROCEDURE:    Ultrasound-guided placement of right IJ tunneled dialysis catheter  SURGEON: Judeth Cornfield. Scot Dock, MD  ASSIST: None  ANESTHESIA: Local with sedation  EBL: Minimal  INDICATIONS:    [name] is a 50 y.o. male who has a left radiocephalic AV fistula.  This was not functioning we were asked to place a tunneled dialysis catheter so that he can begin dialysis.  FINDINGS:   Patent right IJ  TECHNIQUE:   The patient was brought to the operating room and sedated by anesthesia.  The neck and upper chest were prepped and draped in usual sterile fashion.  Under ultrasound guidance, after the skin was anesthetized, I cannulated the right IJ with a micropuncture needle and a micropuncture sheath was introduced over a wire.  I then advanced the J-wire through the micropuncture sheath.  This was done under fluoroscopic guidance.  The exit site for the catheter was selected and the skin anesthetized between the 2 areas.  A 19 cm catheter was brought through the tunnel.  Next I dilated the tract over the wire sequentially and then placed the dilator and peel-away sheath.  The wire and dilator were removed.  The catheter was passed through the peel-away sheath and positioned in the superior vena cava.  The peel-away sheath was removed.  both ports withdrew easily with and flushed with heparinized saline and filled with concentrated heparin.  The catheter was secured at its exit site with 3-0 nylon suture.  The IJ cannulation site was closed with a 4-0 subcuticular stitch.  Patient tolerated the procedure well was transferred to the recovery room in stable condition.  All needle and sponge counts were correct.    Deitra Mayo, MD, FACS [facility]  DATE OF DICTATION:    05/22/2020

## 2020-05-22 NOTE — Anesthesia Preprocedure Evaluation (Signed)
Anesthesia Evaluation  Patient identified by MRN, date of birth, ID band Patient awake    Reviewed: Allergy & Precautions, NPO status , Patient's Chart, lab work & pertinent test results  Airway Mallampati: II  TM Distance: >3 FB Neck ROM: Full    Dental  (+) Teeth Intact   Pulmonary sleep apnea ,    Pulmonary exam normal        Cardiovascular hypertension, Pt. on medications and Pt. on home beta blockers + Peripheral Vascular Disease   Rhythm:Regular Rate:Normal     Neuro/Psych negative neurological ROS  negative psych ROS   GI/Hepatic negative GI ROS, Neg liver ROS,   Endo/Other  negative endocrine ROS  Renal/GU CRF and DialysisRenal disease  negative genitourinary   Musculoskeletal  (+) Arthritis ,   Abdominal (+)  Abdomen: soft. Bowel sounds: normal.  Peds  Hematology  (+) anemia ,   Anesthesia Other Findings   Reproductive/Obstetrics                             Anesthesia Physical Anesthesia Plan  ASA: III  Anesthesia Plan: MAC   Post-op Pain Management:    Induction: Intravenous  PONV Risk Score and Plan: 1 and Ondansetron, Dexamethasone, Midazolam and Treatment may vary due to age or medical condition  Airway Management Planned: Simple Face Mask, Natural Airway and Nasal Cannula  Additional Equipment: None  Intra-op Plan:   Post-operative Plan:   Informed Consent: I have reviewed the patients History and Physical, chart, labs and discussed the procedure including the risks, benefits and alternatives for the proposed anesthesia with the patient or authorized representative who has indicated his/her understanding and acceptance.     Dental advisory given  Plan Discussed with: CRNA  Anesthesia Plan Comments: (Lab Results      Component                Value               Date                      WBC                      3.1 (L)             12/12/2019                 HGB                      8.9 (L)             05/15/2020                HCT                      31.0 (L)            02/09/2020                MCV                      92                  12/12/2019                PLT  211                 12/12/2019           Lab Results      Component                Value               Date                      NA                       148 (H)             03/21/2020                K                        5.5 (H)             03/21/2020                CO2                      17 (L)              03/21/2020                GLUCOSE                  74                  03/21/2020                BUN                      104 (HH)            03/21/2020                CREATININE               14.74 (HH)          03/21/2020                CALCIUM                  6.5 (LL)            03/21/2020                GFRNONAA                 3 (L)               03/21/2020                GFRAA                    4 (L)               03/21/2020          )        Anesthesia Quick Evaluation

## 2020-05-22 NOTE — H&P (Signed)
   Patient name: Hector Brooks MRN: [id number] DOB: Aug 05, 1970 Sex: male  REASON FOR VISIT:   For placement of tunneled dialysis catheter  HPI:   Hector Brooks is a pleasant 50 y.o. male who just began hemodialysis yesterday according to the patient.  He had a left radiocephalic fistula placed in December.  He reportedly dialyzes on Monday Tuesdays Thursdays and Fridays.  The fistula did not work and therefore he was placed on the schedule for placement of a tunneled dialysis catheter.  Current Facility-Administered Medications  Medication Dose Route Frequency Provider Last Rate Last Admin  . 0.9 %  sodium chloride infusion   Intravenous Continuous Angelia Mould, MD 10 mL/hr at 05/22/20 1223 New Bag at 05/22/20 1223  . ceFAZolin (ANCEF) 3 g in dextrose 5 % 50 mL IVPB  3 g Intravenous 30 min Pre-Op Angelia Mould, MD      . chlorhexidine (HIBICLENS) 4 % liquid 4 application  60 mL Topical Once Angelia Mould, MD       And  . Derrill Memo ON 05/23/2020] chlorhexidine (HIBICLENS) 4 % liquid 4 application  60 mL Topical Once Angelia Mould, MD        REVIEW OF SYSTEMS:  [X]  denotes positive finding, [ ]  denotes negative finding Vascular    Leg swelling    Cardiac    Chest pain or chest pressure:    Shortness of breath upon exertion:    Short of breath when lying flat:    Irregular heart rhythm:    Constitutional    Fever or chills:     PHYSICAL EXAM:   Vitals:   05/22/20 1131  BP: (!) 162/104  Pulse: 91  Resp: 20  Temp: 98.1 F (36.7 C)  TempSrc: Oral  SpO2: 99%  Weight: 127 kg  Height: 5\' 9"  (1.753 m)    GENERAL: The patient is a well-nourished male, in no acute distress. The vital signs are documented above. CARDIOVASCULAR: There is a regular rate and rhythm. PULMONARY: There is good air exchange bilaterally without wheezing or rales.  DATA:   Potassium is 5.2.  MEDICAL ISSUES:   END-STAGE RENAL DISEASE: Patient presents for  placement of a tunneled dialysis catheter.  He will dialyze tomorrow.  We will send his pain prescription to [facility] on [address].  I have reviewed the indications for the procedure and the potential complications and he is agreeable to proceed.  Deitra Mayo [facility] [place] [phone]

## 2020-05-23 ENCOUNTER — Encounter (HOSPITAL_COMMUNITY): Payer: Self-pay | Admitting: Vascular Surgery

## 2020-05-23 DIAGNOSIS — N2581 Secondary hyperparathyroidism of renal origin: Secondary | ICD-10-CM | POA: Diagnosis not present

## 2020-05-23 DIAGNOSIS — N186 End stage renal disease: Secondary | ICD-10-CM | POA: Diagnosis not present

## 2020-05-23 DIAGNOSIS — Z992 Dependence on renal dialysis: Secondary | ICD-10-CM | POA: Diagnosis not present

## 2020-05-23 DIAGNOSIS — I129 Hypertensive chronic kidney disease with stage 1 through stage 4 chronic kidney disease, or unspecified chronic kidney disease: Secondary | ICD-10-CM | POA: Diagnosis not present

## 2020-05-23 NOTE — Anesthesia Postprocedure Evaluation (Signed)
Anesthesia Post Note  Patient: Hector Brooks  Procedure(s) Performed: INSERTION OF TUNNELED DIALYSIS CATHETER (Right Chest)     Patient location during evaluation: PACU Anesthesia Type: MAC Level of consciousness: awake and alert Pain management: pain level controlled Vital Signs Assessment: post-procedure vital signs reviewed and stable Respiratory status: spontaneous breathing, nonlabored ventilation, respiratory function stable and patient connected to nasal cannula oxygen Cardiovascular status: stable and blood pressure returned to baseline Postop Assessment: no apparent nausea or vomiting Anesthetic complications: no   No complications documented.  Last Vitals:  Vitals:   05/22/20 1536 05/22/20 1551  BP: (!) 165/103 (!) 155/94  Pulse: 78 77  Resp: 14 15  Temp:  (!) 36.1 C  SpO2: 95% 94%    Last Pain:  Vitals:   05/22/20 1551  TempSrc:   PainSc: 0-No pain                 Belenda Cruise [place]

## 2020-05-24 DIAGNOSIS — N2581 Secondary hyperparathyroidism of renal origin: Secondary | ICD-10-CM | POA: Diagnosis not present

## 2020-05-24 DIAGNOSIS — I129 Hypertensive chronic kidney disease with stage 1 through stage 4 chronic kidney disease, or unspecified chronic kidney disease: Secondary | ICD-10-CM | POA: Diagnosis not present

## 2020-05-24 DIAGNOSIS — Z992 Dependence on renal dialysis: Secondary | ICD-10-CM | POA: Diagnosis not present

## 2020-05-24 DIAGNOSIS — N186 End stage renal disease: Secondary | ICD-10-CM | POA: Diagnosis not present

## 2020-05-25 DIAGNOSIS — N186 End stage renal disease: Secondary | ICD-10-CM | POA: Diagnosis not present

## 2020-05-25 DIAGNOSIS — Z992 Dependence on renal dialysis: Secondary | ICD-10-CM | POA: Diagnosis not present

## 2020-05-25 DIAGNOSIS — I129 Hypertensive chronic kidney disease with stage 1 through stage 4 chronic kidney disease, or unspecified chronic kidney disease: Secondary | ICD-10-CM | POA: Diagnosis not present

## 2020-05-25 DIAGNOSIS — N2581 Secondary hyperparathyroidism of renal origin: Secondary | ICD-10-CM | POA: Diagnosis not present

## 2020-05-28 DIAGNOSIS — Z992 Dependence on renal dialysis: Secondary | ICD-10-CM | POA: Diagnosis not present

## 2020-05-28 DIAGNOSIS — N186 End stage renal disease: Secondary | ICD-10-CM | POA: Diagnosis not present

## 2020-05-28 DIAGNOSIS — N2581 Secondary hyperparathyroidism of renal origin: Secondary | ICD-10-CM | POA: Diagnosis not present

## 2020-05-28 DIAGNOSIS — Z23 Encounter for immunization: Secondary | ICD-10-CM | POA: Diagnosis not present

## 2020-05-29 ENCOUNTER — Encounter (HOSPITAL_COMMUNITY): Payer: Federal, State, Local not specified - PPO

## 2020-05-29 DIAGNOSIS — Z23 Encounter for immunization: Secondary | ICD-10-CM | POA: Diagnosis not present

## 2020-05-29 DIAGNOSIS — N186 End stage renal disease: Secondary | ICD-10-CM | POA: Diagnosis not present

## 2020-05-29 DIAGNOSIS — N2581 Secondary hyperparathyroidism of renal origin: Secondary | ICD-10-CM | POA: Diagnosis not present

## 2020-05-29 DIAGNOSIS — Z992 Dependence on renal dialysis: Secondary | ICD-10-CM | POA: Diagnosis not present

## 2020-05-31 ENCOUNTER — Other Ambulatory Visit (HOSPITAL_COMMUNITY): Payer: Self-pay | Admitting: Nephrology

## 2020-05-31 DIAGNOSIS — Z992 Dependence on renal dialysis: Secondary | ICD-10-CM | POA: Diagnosis not present

## 2020-05-31 DIAGNOSIS — Z23 Encounter for immunization: Secondary | ICD-10-CM | POA: Diagnosis not present

## 2020-05-31 DIAGNOSIS — Z952 Presence of prosthetic heart valve: Secondary | ICD-10-CM

## 2020-05-31 DIAGNOSIS — N186 End stage renal disease: Secondary | ICD-10-CM | POA: Diagnosis not present

## 2020-05-31 DIAGNOSIS — N049 Nephrotic syndrome with unspecified morphologic changes: Secondary | ICD-10-CM | POA: Diagnosis not present

## 2020-05-31 DIAGNOSIS — N2581 Secondary hyperparathyroidism of renal origin: Secondary | ICD-10-CM | POA: Diagnosis not present

## 2020-06-01 ENCOUNTER — Other Ambulatory Visit: Payer: Self-pay

## 2020-06-01 ENCOUNTER — Ambulatory Visit (HOSPITAL_COMMUNITY)
Admission: RE | Admit: 2020-06-01 | Discharge: 2020-06-01 | Disposition: A | Discharge status: Home / Self Care | Payer: Federal, State, Local not specified - PPO | Source: Ambulatory Visit | Attending: Registered Nurse | Admitting: Registered Nurse

## 2020-06-01 ENCOUNTER — Ambulatory Visit (INDEPENDENT_AMBULATORY_CARE_PROVIDER_SITE_OTHER)
Admission: RE | Admit: 2020-06-01 | Discharge: 2020-06-01 | Disposition: A | Discharge status: Home / Self Care | Payer: Federal, State, Local not specified - PPO | Source: Ambulatory Visit | Attending: Nephrology | Admitting: Nephrology

## 2020-06-01 DIAGNOSIS — E8770 Fluid overload, unspecified: Secondary | ICD-10-CM | POA: Diagnosis not present

## 2020-06-01 DIAGNOSIS — Z992 Dependence on renal dialysis: Secondary | ICD-10-CM | POA: Diagnosis not present

## 2020-06-01 DIAGNOSIS — Z952 Presence of prosthetic heart valve: Secondary | ICD-10-CM

## 2020-06-01 DIAGNOSIS — E875 Hyperkalemia: Secondary | ICD-10-CM | POA: Diagnosis not present

## 2020-06-01 DIAGNOSIS — N2581 Secondary hyperparathyroidism of renal origin: Secondary | ICD-10-CM | POA: Diagnosis not present

## 2020-06-01 DIAGNOSIS — I825Y1 Chronic embolism and thrombosis of unspecified deep veins of right proximal lower extremity: Secondary | ICD-10-CM | POA: Insufficient documentation

## 2020-06-01 DIAGNOSIS — N186 End stage renal disease: Secondary | ICD-10-CM | POA: Diagnosis not present

## 2020-06-01 NOTE — Progress Notes (Signed)
Bilateral lower extremity venous study completed.      Please see CV Proc for preliminary results.   Rushi Chasen, RVT  

## 2020-06-04 DIAGNOSIS — Z992 Dependence on renal dialysis: Secondary | ICD-10-CM | POA: Diagnosis not present

## 2020-06-04 DIAGNOSIS — Z23 Encounter for immunization: Secondary | ICD-10-CM | POA: Diagnosis not present

## 2020-06-04 DIAGNOSIS — N2581 Secondary hyperparathyroidism of renal origin: Secondary | ICD-10-CM | POA: Diagnosis not present

## 2020-06-04 DIAGNOSIS — E8770 Fluid overload, unspecified: Secondary | ICD-10-CM | POA: Diagnosis not present

## 2020-06-04 DIAGNOSIS — E875 Hyperkalemia: Secondary | ICD-10-CM | POA: Diagnosis not present

## 2020-06-04 DIAGNOSIS — N186 End stage renal disease: Secondary | ICD-10-CM | POA: Diagnosis not present

## 2020-06-05 ENCOUNTER — Inpatient Hospital Stay: Payer: Federal, State, Local not specified - PPO

## 2020-06-05 ENCOUNTER — Inpatient Hospital Stay: Payer: Federal, State, Local not specified - PPO | Attending: Oncology | Admitting: Oncology

## 2020-06-05 DIAGNOSIS — N186 End stage renal disease: Secondary | ICD-10-CM | POA: Diagnosis not present

## 2020-06-05 DIAGNOSIS — E875 Hyperkalemia: Secondary | ICD-10-CM | POA: Diagnosis not present

## 2020-06-05 DIAGNOSIS — N2581 Secondary hyperparathyroidism of renal origin: Secondary | ICD-10-CM | POA: Diagnosis not present

## 2020-06-05 DIAGNOSIS — Z992 Dependence on renal dialysis: Secondary | ICD-10-CM | POA: Diagnosis not present

## 2020-06-05 DIAGNOSIS — Z23 Encounter for immunization: Secondary | ICD-10-CM | POA: Diagnosis not present

## 2020-06-05 DIAGNOSIS — E8770 Fluid overload, unspecified: Secondary | ICD-10-CM | POA: Diagnosis not present

## 2020-06-07 ENCOUNTER — Encounter: Payer: Self-pay | Admitting: Registered Nurse

## 2020-06-07 DIAGNOSIS — E875 Hyperkalemia: Secondary | ICD-10-CM | POA: Diagnosis not present

## 2020-06-07 DIAGNOSIS — E8770 Fluid overload, unspecified: Secondary | ICD-10-CM | POA: Diagnosis not present

## 2020-06-07 DIAGNOSIS — Z23 Encounter for immunization: Secondary | ICD-10-CM | POA: Diagnosis not present

## 2020-06-07 DIAGNOSIS — Z992 Dependence on renal dialysis: Secondary | ICD-10-CM | POA: Diagnosis not present

## 2020-06-07 DIAGNOSIS — N2581 Secondary hyperparathyroidism of renal origin: Secondary | ICD-10-CM | POA: Diagnosis not present

## 2020-06-07 DIAGNOSIS — N186 End stage renal disease: Secondary | ICD-10-CM | POA: Diagnosis not present

## 2020-06-08 ENCOUNTER — Ambulatory Visit: Payer: Federal, State, Local not specified - PPO | Admitting: Physician Assistant

## 2020-06-08 ENCOUNTER — Encounter: Payer: Self-pay | Admitting: Physician Assistant

## 2020-06-08 ENCOUNTER — Other Ambulatory Visit: Payer: Self-pay

## 2020-06-08 VITALS — BP 109/66 | HR 97 | Temp 98.7°F | Resp 20 | Ht 69.0 in | Wt 254.7 lb

## 2020-06-08 DIAGNOSIS — N2581 Secondary hyperparathyroidism of renal origin: Secondary | ICD-10-CM | POA: Diagnosis not present

## 2020-06-08 DIAGNOSIS — Z992 Dependence on renal dialysis: Secondary | ICD-10-CM | POA: Diagnosis not present

## 2020-06-08 DIAGNOSIS — E8770 Fluid overload, unspecified: Secondary | ICD-10-CM | POA: Diagnosis not present

## 2020-06-08 DIAGNOSIS — Z23 Encounter for immunization: Secondary | ICD-10-CM | POA: Diagnosis not present

## 2020-06-08 DIAGNOSIS — N186 End stage renal disease: Secondary | ICD-10-CM

## 2020-06-08 DIAGNOSIS — E875 Hyperkalemia: Secondary | ICD-10-CM | POA: Diagnosis not present

## 2020-06-08 NOTE — Progress Notes (Signed)
Established Dialysis Access   History of Present Illness   Hector Brooks is a 50 y.o. ([birth year]) male who presents for follow up of left radiocephalic AV fistula by Dr. Trula Slade on 02/09/20. On 05/21/20 our office was contacted by the dialysis center to report that the access was infiltrated and could not be used and patient needed dialysis. On 05/22/20 he had a right IJ TDC placed by Dr. Scot Dock so that he could begin dialysis.   He presents today to see if the fistula is now ready for cannulation. He reports that the right IJ [facility] has been working well. His left upper arm swelling and pain has resolved. He denies any other signs of symptoms of steal. He dialyzes on Mon/Tues/ Thurs/Friday at the [facility] Location. Dr. Justin Mend is his Nephrologist  Current Outpatient Medications  Medication Sig Dispense Refill  . amLODipine (NORVASC) 10 MG tablet Take 10 mg by mouth daily.    Marland Kitchen atorvastatin (LIPITOR) 80 MG tablet Take 0.5 tablets (40 mg total) by mouth daily. 90 tablet 3  . calcitRIOL (ROCALTROL) 0.25 MCG capsule Take 0.25 mcg by mouth daily.    . calcium acetate (PHOSLO) 667 MG capsule Take 1,334 mg by mouth 3 (three) times daily. With a meal    . FEROSUL 325 (65 Fe) MG tablet Take 325 mg by mouth daily.    . furosemide (LASIX) 40 MG tablet Take 1 tablet (40 mg total) by mouth daily. 90 tablet 1  . hydrALAZINE (APRESOLINE) 50 MG tablet Take 1 tablet (50 mg total) by mouth 3 (three) times daily. 270 tablet 3  . labetalol (NORMODYNE) 300 MG tablet Take 1 tablet (300 mg total) by mouth 2 (two) times daily. 180 tablet 3  . oxyCODONE (ROXICODONE) 5 MG immediate release tablet Take 1 tablet (5 mg total) by mouth every 4 (four) hours as needed. 15 tablet 0  . sodium bicarbonate 650 MG tablet Take 650 mg by mouth 2 (two) times daily.     No current facility-administered medications for this visit.    On ROS today: negative unless stated in HPI   Physical Examination    Vitals:   06/08/20 1547  BP: 109/66  Pulse: 97  Resp: 20  Temp: 98.7 F (37.1 C)  TempSrc: Temporal  SpO2: 95%  Weight: 254 lb 11.2 oz (115.5 kg)  Height: 5\' 9"  (1.753 m)   Body mass index is 37.61 kg/m.  Left arm  2+ left radial pulse, good thrill, audible bruit, 5/5 grip strength, no upper extremity swelling  Non-invasive Vascular Imaging   VAS US Duplex Dialysis Access: 06/01/20 Findings:  +--------------------+----------+-----------------+--------+  AVF         PSV (cm/s)Flow Vol (mL/min)Comments  +--------------------+----------+-----------------+--------+  Native artery inflow  124     1007          +--------------------+----------+-----------------+--------+  AVF Anastomosis     629                 +--------------------+----------+-----------------+--------+     +------------+----------+-------------+----------+--------+  OUTFLOW VEINPSV (cm/s)Diameter (cm)Depth (cm)Describe  +------------+----------+-------------+----------+--------+  Prox Forearm  233    0.53     0.46        +------------+----------+-------------+----------+--------+  Mid Forearm   403    0.53     0.62        +------------+----------+-------------+----------+--------+  Dist Forearm  575    0.39     0.37        +------------+----------+-------------+----------+--------+    Medical Decision  Making    Hector Brooks is a 50 y.o. male who presents for follow up of  left radiocephalic AV fistula by Dr. Trula Slade on 02/09/20. This was infiltrated on the first attempt at cannulation on 05/21/20. He needed urgent dialysis so a right IJ TDC was placed. He has been receiving dialysis on Monday/ Tues/ Thurs/ and Friday via the catheter without difficulty. The left upper extremity swelling and pain has resolved following infiltration. His duplex today shows patent left radiocephalic AV  fistula. His fistula is ready for cannulation again. I discussed with him that should he continue to have issues with cannulation this may need to be converted to a left upper arm fistula. He will likely need a follow up vein mapping schedule should this be the case. If there are no issues with the current fistula with cannulation, the dialysis center can notify us and his [facility] can be arranged to be removed at the discretion of the dialysis center  He will follow up on an as needed basis   Karoline Caldwell, PA-C [facility] [place]: [phone]  Clinic MD: Donzetta Matters

## 2020-06-11 DIAGNOSIS — Z992 Dependence on renal dialysis: Secondary | ICD-10-CM | POA: Diagnosis not present

## 2020-06-11 DIAGNOSIS — E8770 Fluid overload, unspecified: Secondary | ICD-10-CM | POA: Diagnosis not present

## 2020-06-11 DIAGNOSIS — N186 End stage renal disease: Secondary | ICD-10-CM | POA: Diagnosis not present

## 2020-06-11 DIAGNOSIS — N2581 Secondary hyperparathyroidism of renal origin: Secondary | ICD-10-CM | POA: Diagnosis not present

## 2020-06-11 DIAGNOSIS — E875 Hyperkalemia: Secondary | ICD-10-CM | POA: Diagnosis not present

## 2020-06-12 ENCOUNTER — Inpatient Hospital Stay (HOSPITAL_COMMUNITY): Admission: RE | Admit: 2020-06-12 | Payer: Federal, State, Local not specified - PPO | Source: Ambulatory Visit

## 2020-06-12 DIAGNOSIS — E8770 Fluid overload, unspecified: Secondary | ICD-10-CM | POA: Diagnosis not present

## 2020-06-12 DIAGNOSIS — N186 End stage renal disease: Secondary | ICD-10-CM | POA: Diagnosis not present

## 2020-06-12 DIAGNOSIS — Z992 Dependence on renal dialysis: Secondary | ICD-10-CM | POA: Diagnosis not present

## 2020-06-12 DIAGNOSIS — N2581 Secondary hyperparathyroidism of renal origin: Secondary | ICD-10-CM | POA: Diagnosis not present

## 2020-06-12 DIAGNOSIS — E875 Hyperkalemia: Secondary | ICD-10-CM | POA: Diagnosis not present

## 2020-06-14 DIAGNOSIS — E875 Hyperkalemia: Secondary | ICD-10-CM | POA: Diagnosis not present

## 2020-06-14 DIAGNOSIS — Z992 Dependence on renal dialysis: Secondary | ICD-10-CM | POA: Diagnosis not present

## 2020-06-14 DIAGNOSIS — N2581 Secondary hyperparathyroidism of renal origin: Secondary | ICD-10-CM | POA: Diagnosis not present

## 2020-06-14 DIAGNOSIS — E8770 Fluid overload, unspecified: Secondary | ICD-10-CM | POA: Diagnosis not present

## 2020-06-14 DIAGNOSIS — N186 End stage renal disease: Secondary | ICD-10-CM | POA: Diagnosis not present

## 2020-06-15 DIAGNOSIS — N186 End stage renal disease: Secondary | ICD-10-CM | POA: Diagnosis not present

## 2020-06-15 DIAGNOSIS — E8770 Fluid overload, unspecified: Secondary | ICD-10-CM | POA: Diagnosis not present

## 2020-06-15 DIAGNOSIS — N2581 Secondary hyperparathyroidism of renal origin: Secondary | ICD-10-CM | POA: Diagnosis not present

## 2020-06-15 DIAGNOSIS — Z992 Dependence on renal dialysis: Secondary | ICD-10-CM | POA: Diagnosis not present

## 2020-06-15 DIAGNOSIS — E875 Hyperkalemia: Secondary | ICD-10-CM | POA: Diagnosis not present

## 2020-06-18 DIAGNOSIS — Z992 Dependence on renal dialysis: Secondary | ICD-10-CM | POA: Diagnosis not present

## 2020-06-18 DIAGNOSIS — E8770 Fluid overload, unspecified: Secondary | ICD-10-CM | POA: Diagnosis not present

## 2020-06-18 DIAGNOSIS — E875 Hyperkalemia: Secondary | ICD-10-CM | POA: Diagnosis not present

## 2020-06-18 DIAGNOSIS — N186 End stage renal disease: Secondary | ICD-10-CM | POA: Diagnosis not present

## 2020-06-18 DIAGNOSIS — N2581 Secondary hyperparathyroidism of renal origin: Secondary | ICD-10-CM | POA: Diagnosis not present

## 2020-06-19 DIAGNOSIS — Z992 Dependence on renal dialysis: Secondary | ICD-10-CM | POA: Diagnosis not present

## 2020-06-19 DIAGNOSIS — E875 Hyperkalemia: Secondary | ICD-10-CM | POA: Diagnosis not present

## 2020-06-19 DIAGNOSIS — N186 End stage renal disease: Secondary | ICD-10-CM | POA: Diagnosis not present

## 2020-06-19 DIAGNOSIS — E8770 Fluid overload, unspecified: Secondary | ICD-10-CM | POA: Diagnosis not present

## 2020-06-19 DIAGNOSIS — N2581 Secondary hyperparathyroidism of renal origin: Secondary | ICD-10-CM | POA: Diagnosis not present

## 2020-06-21 DIAGNOSIS — E8770 Fluid overload, unspecified: Secondary | ICD-10-CM | POA: Diagnosis not present

## 2020-06-21 DIAGNOSIS — N186 End stage renal disease: Secondary | ICD-10-CM | POA: Diagnosis not present

## 2020-06-21 DIAGNOSIS — N2581 Secondary hyperparathyroidism of renal origin: Secondary | ICD-10-CM | POA: Diagnosis not present

## 2020-06-21 DIAGNOSIS — Z992 Dependence on renal dialysis: Secondary | ICD-10-CM | POA: Diagnosis not present

## 2020-06-21 DIAGNOSIS — E875 Hyperkalemia: Secondary | ICD-10-CM | POA: Diagnosis not present

## 2020-06-22 DIAGNOSIS — N186 End stage renal disease: Secondary | ICD-10-CM | POA: Diagnosis not present

## 2020-06-22 DIAGNOSIS — Z992 Dependence on renal dialysis: Secondary | ICD-10-CM | POA: Diagnosis not present

## 2020-06-22 DIAGNOSIS — E875 Hyperkalemia: Secondary | ICD-10-CM | POA: Diagnosis not present

## 2020-06-22 DIAGNOSIS — E8770 Fluid overload, unspecified: Secondary | ICD-10-CM | POA: Diagnosis not present

## 2020-06-22 DIAGNOSIS — N2581 Secondary hyperparathyroidism of renal origin: Secondary | ICD-10-CM | POA: Diagnosis not present

## 2020-06-25 DIAGNOSIS — Z992 Dependence on renal dialysis: Secondary | ICD-10-CM | POA: Diagnosis not present

## 2020-06-25 DIAGNOSIS — E875 Hyperkalemia: Secondary | ICD-10-CM | POA: Diagnosis not present

## 2020-06-25 DIAGNOSIS — E8770 Fluid overload, unspecified: Secondary | ICD-10-CM | POA: Diagnosis not present

## 2020-06-25 DIAGNOSIS — N186 End stage renal disease: Secondary | ICD-10-CM | POA: Diagnosis not present

## 2020-06-25 DIAGNOSIS — N2581 Secondary hyperparathyroidism of renal origin: Secondary | ICD-10-CM | POA: Diagnosis not present

## 2020-06-26 ENCOUNTER — Inpatient Hospital Stay (HOSPITAL_COMMUNITY)
Admission: RE | Admit: 2020-06-26 | Discharge: 2020-06-26 | Disposition: A | Discharge status: Home / Self Care | Payer: Federal, State, Local not specified - PPO | Source: Ambulatory Visit | Attending: Nephrology | Admitting: Nephrology

## 2020-06-26 DIAGNOSIS — N2581 Secondary hyperparathyroidism of renal origin: Secondary | ICD-10-CM | POA: Diagnosis not present

## 2020-06-26 DIAGNOSIS — Z992 Dependence on renal dialysis: Secondary | ICD-10-CM | POA: Diagnosis not present

## 2020-06-26 DIAGNOSIS — E8770 Fluid overload, unspecified: Secondary | ICD-10-CM | POA: Diagnosis not present

## 2020-06-26 DIAGNOSIS — N186 End stage renal disease: Secondary | ICD-10-CM | POA: Diagnosis not present

## 2020-06-26 DIAGNOSIS — E875 Hyperkalemia: Secondary | ICD-10-CM | POA: Diagnosis not present

## 2020-06-28 DIAGNOSIS — E875 Hyperkalemia: Secondary | ICD-10-CM | POA: Diagnosis not present

## 2020-06-28 DIAGNOSIS — N186 End stage renal disease: Secondary | ICD-10-CM | POA: Diagnosis not present

## 2020-06-28 DIAGNOSIS — Z992 Dependence on renal dialysis: Secondary | ICD-10-CM | POA: Diagnosis not present

## 2020-06-28 DIAGNOSIS — E8770 Fluid overload, unspecified: Secondary | ICD-10-CM | POA: Diagnosis not present

## 2020-06-28 DIAGNOSIS — N2581 Secondary hyperparathyroidism of renal origin: Secondary | ICD-10-CM | POA: Diagnosis not present

## 2020-06-29 DIAGNOSIS — Z992 Dependence on renal dialysis: Secondary | ICD-10-CM | POA: Diagnosis not present

## 2020-06-29 DIAGNOSIS — E875 Hyperkalemia: Secondary | ICD-10-CM | POA: Diagnosis not present

## 2020-06-29 DIAGNOSIS — E8770 Fluid overload, unspecified: Secondary | ICD-10-CM | POA: Diagnosis not present

## 2020-06-29 DIAGNOSIS — N2581 Secondary hyperparathyroidism of renal origin: Secondary | ICD-10-CM | POA: Diagnosis not present

## 2020-06-29 DIAGNOSIS — N186 End stage renal disease: Secondary | ICD-10-CM | POA: Diagnosis not present

## 2020-06-30 DIAGNOSIS — Z992 Dependence on renal dialysis: Secondary | ICD-10-CM | POA: Diagnosis not present

## 2020-06-30 DIAGNOSIS — N186 End stage renal disease: Secondary | ICD-10-CM | POA: Diagnosis not present

## 2020-06-30 DIAGNOSIS — N049 Nephrotic syndrome with unspecified morphologic changes: Secondary | ICD-10-CM | POA: Diagnosis not present

## 2020-07-02 DIAGNOSIS — E8770 Fluid overload, unspecified: Secondary | ICD-10-CM | POA: Diagnosis not present

## 2020-07-02 DIAGNOSIS — N2581 Secondary hyperparathyroidism of renal origin: Secondary | ICD-10-CM | POA: Diagnosis not present

## 2020-07-02 DIAGNOSIS — Z992 Dependence on renal dialysis: Secondary | ICD-10-CM | POA: Diagnosis not present

## 2020-07-02 DIAGNOSIS — N186 End stage renal disease: Secondary | ICD-10-CM | POA: Diagnosis not present

## 2020-07-02 DIAGNOSIS — E875 Hyperkalemia: Secondary | ICD-10-CM | POA: Diagnosis not present

## 2020-07-03 DIAGNOSIS — E875 Hyperkalemia: Secondary | ICD-10-CM | POA: Diagnosis not present

## 2020-07-03 DIAGNOSIS — Z992 Dependence on renal dialysis: Secondary | ICD-10-CM | POA: Diagnosis not present

## 2020-07-03 DIAGNOSIS — E8770 Fluid overload, unspecified: Secondary | ICD-10-CM | POA: Diagnosis not present

## 2020-07-03 DIAGNOSIS — N186 End stage renal disease: Secondary | ICD-10-CM | POA: Diagnosis not present

## 2020-07-03 DIAGNOSIS — N2581 Secondary hyperparathyroidism of renal origin: Secondary | ICD-10-CM | POA: Diagnosis not present

## 2020-07-05 DIAGNOSIS — N2581 Secondary hyperparathyroidism of renal origin: Secondary | ICD-10-CM | POA: Diagnosis not present

## 2020-07-05 DIAGNOSIS — N186 End stage renal disease: Secondary | ICD-10-CM | POA: Diagnosis not present

## 2020-07-05 DIAGNOSIS — Z992 Dependence on renal dialysis: Secondary | ICD-10-CM | POA: Diagnosis not present

## 2020-07-05 DIAGNOSIS — E875 Hyperkalemia: Secondary | ICD-10-CM | POA: Diagnosis not present

## 2020-07-05 DIAGNOSIS — E8770 Fluid overload, unspecified: Secondary | ICD-10-CM | POA: Diagnosis not present

## 2020-07-06 DIAGNOSIS — E875 Hyperkalemia: Secondary | ICD-10-CM | POA: Diagnosis not present

## 2020-07-06 DIAGNOSIS — E8770 Fluid overload, unspecified: Secondary | ICD-10-CM | POA: Diagnosis not present

## 2020-07-06 DIAGNOSIS — N186 End stage renal disease: Secondary | ICD-10-CM | POA: Diagnosis not present

## 2020-07-06 DIAGNOSIS — Z992 Dependence on renal dialysis: Secondary | ICD-10-CM | POA: Diagnosis not present

## 2020-07-06 DIAGNOSIS — N2581 Secondary hyperparathyroidism of renal origin: Secondary | ICD-10-CM | POA: Diagnosis not present

## 2020-07-09 DIAGNOSIS — N186 End stage renal disease: Secondary | ICD-10-CM | POA: Diagnosis not present

## 2020-07-09 DIAGNOSIS — E8770 Fluid overload, unspecified: Secondary | ICD-10-CM | POA: Diagnosis not present

## 2020-07-09 DIAGNOSIS — Z992 Dependence on renal dialysis: Secondary | ICD-10-CM | POA: Diagnosis not present

## 2020-07-09 DIAGNOSIS — E875 Hyperkalemia: Secondary | ICD-10-CM | POA: Diagnosis not present

## 2020-07-09 DIAGNOSIS — N2581 Secondary hyperparathyroidism of renal origin: Secondary | ICD-10-CM | POA: Diagnosis not present

## 2020-07-10 ENCOUNTER — Encounter (HOSPITAL_COMMUNITY): Payer: Federal, State, Local not specified - PPO

## 2020-07-10 ENCOUNTER — Other Ambulatory Visit: Payer: Self-pay | Admitting: Cardiovascular Disease

## 2020-07-10 DIAGNOSIS — E875 Hyperkalemia: Secondary | ICD-10-CM | POA: Diagnosis not present

## 2020-07-10 DIAGNOSIS — E8770 Fluid overload, unspecified: Secondary | ICD-10-CM | POA: Diagnosis not present

## 2020-07-10 DIAGNOSIS — N186 End stage renal disease: Secondary | ICD-10-CM | POA: Diagnosis not present

## 2020-07-10 DIAGNOSIS — N2581 Secondary hyperparathyroidism of renal origin: Secondary | ICD-10-CM | POA: Diagnosis not present

## 2020-07-10 DIAGNOSIS — Z992 Dependence on renal dialysis: Secondary | ICD-10-CM | POA: Diagnosis not present

## 2020-07-12 DIAGNOSIS — E8770 Fluid overload, unspecified: Secondary | ICD-10-CM | POA: Diagnosis not present

## 2020-07-12 DIAGNOSIS — N2581 Secondary hyperparathyroidism of renal origin: Secondary | ICD-10-CM | POA: Diagnosis not present

## 2020-07-12 DIAGNOSIS — E875 Hyperkalemia: Secondary | ICD-10-CM | POA: Diagnosis not present

## 2020-07-12 DIAGNOSIS — Z992 Dependence on renal dialysis: Secondary | ICD-10-CM | POA: Diagnosis not present

## 2020-07-12 DIAGNOSIS — N186 End stage renal disease: Secondary | ICD-10-CM | POA: Diagnosis not present

## 2020-07-13 DIAGNOSIS — N186 End stage renal disease: Secondary | ICD-10-CM | POA: Diagnosis not present

## 2020-07-13 DIAGNOSIS — E8770 Fluid overload, unspecified: Secondary | ICD-10-CM | POA: Diagnosis not present

## 2020-07-13 DIAGNOSIS — E875 Hyperkalemia: Secondary | ICD-10-CM | POA: Diagnosis not present

## 2020-07-13 DIAGNOSIS — Z992 Dependence on renal dialysis: Secondary | ICD-10-CM | POA: Diagnosis not present

## 2020-07-13 DIAGNOSIS — N2581 Secondary hyperparathyroidism of renal origin: Secondary | ICD-10-CM | POA: Diagnosis not present

## 2020-07-16 DIAGNOSIS — Z23 Encounter for immunization: Secondary | ICD-10-CM | POA: Diagnosis not present

## 2020-07-16 DIAGNOSIS — N2581 Secondary hyperparathyroidism of renal origin: Secondary | ICD-10-CM | POA: Diagnosis not present

## 2020-07-16 DIAGNOSIS — E8770 Fluid overload, unspecified: Secondary | ICD-10-CM | POA: Diagnosis not present

## 2020-07-16 DIAGNOSIS — N186 End stage renal disease: Secondary | ICD-10-CM | POA: Diagnosis not present

## 2020-07-16 DIAGNOSIS — E875 Hyperkalemia: Secondary | ICD-10-CM | POA: Diagnosis not present

## 2020-07-16 DIAGNOSIS — Z992 Dependence on renal dialysis: Secondary | ICD-10-CM | POA: Diagnosis not present

## 2020-07-17 DIAGNOSIS — N2581 Secondary hyperparathyroidism of renal origin: Secondary | ICD-10-CM | POA: Diagnosis not present

## 2020-07-17 DIAGNOSIS — N186 End stage renal disease: Secondary | ICD-10-CM | POA: Diagnosis not present

## 2020-07-17 DIAGNOSIS — Z992 Dependence on renal dialysis: Secondary | ICD-10-CM | POA: Diagnosis not present

## 2020-07-17 DIAGNOSIS — Z23 Encounter for immunization: Secondary | ICD-10-CM | POA: Diagnosis not present

## 2020-07-17 DIAGNOSIS — E875 Hyperkalemia: Secondary | ICD-10-CM | POA: Diagnosis not present

## 2020-07-17 DIAGNOSIS — E8770 Fluid overload, unspecified: Secondary | ICD-10-CM | POA: Diagnosis not present

## 2020-07-19 DIAGNOSIS — N2581 Secondary hyperparathyroidism of renal origin: Secondary | ICD-10-CM | POA: Diagnosis not present

## 2020-07-19 DIAGNOSIS — I871 Compression of vein: Secondary | ICD-10-CM | POA: Diagnosis not present

## 2020-07-19 DIAGNOSIS — E8770 Fluid overload, unspecified: Secondary | ICD-10-CM | POA: Diagnosis not present

## 2020-07-19 DIAGNOSIS — E875 Hyperkalemia: Secondary | ICD-10-CM | POA: Diagnosis not present

## 2020-07-19 DIAGNOSIS — T82898A Other specified complication of vascular prosthetic devices, implants and grafts, initial encounter: Secondary | ICD-10-CM | POA: Diagnosis not present

## 2020-07-19 DIAGNOSIS — N186 End stage renal disease: Secondary | ICD-10-CM | POA: Diagnosis not present

## 2020-07-19 DIAGNOSIS — Z992 Dependence on renal dialysis: Secondary | ICD-10-CM | POA: Diagnosis not present

## 2020-07-19 DIAGNOSIS — T82858D Stenosis of vascular prosthetic devices, implants and grafts, subsequent encounter: Secondary | ICD-10-CM | POA: Diagnosis not present

## 2020-07-19 DIAGNOSIS — Z23 Encounter for immunization: Secondary | ICD-10-CM | POA: Diagnosis not present

## 2020-07-20 DIAGNOSIS — Z23 Encounter for immunization: Secondary | ICD-10-CM | POA: Diagnosis not present

## 2020-07-20 DIAGNOSIS — E8770 Fluid overload, unspecified: Secondary | ICD-10-CM | POA: Diagnosis not present

## 2020-07-20 DIAGNOSIS — N2581 Secondary hyperparathyroidism of renal origin: Secondary | ICD-10-CM | POA: Diagnosis not present

## 2020-07-20 DIAGNOSIS — E875 Hyperkalemia: Secondary | ICD-10-CM | POA: Diagnosis not present

## 2020-07-20 DIAGNOSIS — N186 End stage renal disease: Secondary | ICD-10-CM | POA: Diagnosis not present

## 2020-07-20 DIAGNOSIS — Z992 Dependence on renal dialysis: Secondary | ICD-10-CM | POA: Diagnosis not present

## 2020-07-23 DIAGNOSIS — I129 Hypertensive chronic kidney disease with stage 1 through stage 4 chronic kidney disease, or unspecified chronic kidney disease: Secondary | ICD-10-CM | POA: Diagnosis not present

## 2020-07-23 DIAGNOSIS — Z992 Dependence on renal dialysis: Secondary | ICD-10-CM | POA: Diagnosis not present

## 2020-07-23 DIAGNOSIS — E8770 Fluid overload, unspecified: Secondary | ICD-10-CM | POA: Diagnosis not present

## 2020-07-23 DIAGNOSIS — E875 Hyperkalemia: Secondary | ICD-10-CM | POA: Diagnosis not present

## 2020-07-23 DIAGNOSIS — E872 Acidosis: Secondary | ICD-10-CM | POA: Diagnosis not present

## 2020-07-23 DIAGNOSIS — N186 End stage renal disease: Secondary | ICD-10-CM | POA: Diagnosis not present

## 2020-07-23 DIAGNOSIS — N2581 Secondary hyperparathyroidism of renal origin: Secondary | ICD-10-CM | POA: Diagnosis not present

## 2020-07-24 DIAGNOSIS — N186 End stage renal disease: Secondary | ICD-10-CM | POA: Diagnosis not present

## 2020-07-24 DIAGNOSIS — E8770 Fluid overload, unspecified: Secondary | ICD-10-CM | POA: Diagnosis not present

## 2020-07-24 DIAGNOSIS — N2581 Secondary hyperparathyroidism of renal origin: Secondary | ICD-10-CM | POA: Diagnosis not present

## 2020-07-24 DIAGNOSIS — E872 Acidosis: Secondary | ICD-10-CM | POA: Diagnosis not present

## 2020-07-24 DIAGNOSIS — E875 Hyperkalemia: Secondary | ICD-10-CM | POA: Diagnosis not present

## 2020-07-24 DIAGNOSIS — Z992 Dependence on renal dialysis: Secondary | ICD-10-CM | POA: Diagnosis not present

## 2020-07-24 DIAGNOSIS — I129 Hypertensive chronic kidney disease with stage 1 through stage 4 chronic kidney disease, or unspecified chronic kidney disease: Secondary | ICD-10-CM | POA: Diagnosis not present

## 2020-07-26 DIAGNOSIS — E875 Hyperkalemia: Secondary | ICD-10-CM | POA: Diagnosis not present

## 2020-07-26 DIAGNOSIS — E8770 Fluid overload, unspecified: Secondary | ICD-10-CM | POA: Diagnosis not present

## 2020-07-26 DIAGNOSIS — E872 Acidosis: Secondary | ICD-10-CM | POA: Diagnosis not present

## 2020-07-26 DIAGNOSIS — I129 Hypertensive chronic kidney disease with stage 1 through stage 4 chronic kidney disease, or unspecified chronic kidney disease: Secondary | ICD-10-CM | POA: Diagnosis not present

## 2020-07-26 DIAGNOSIS — N186 End stage renal disease: Secondary | ICD-10-CM | POA: Diagnosis not present

## 2020-07-26 DIAGNOSIS — Z992 Dependence on renal dialysis: Secondary | ICD-10-CM | POA: Diagnosis not present

## 2020-07-26 DIAGNOSIS — N2581 Secondary hyperparathyroidism of renal origin: Secondary | ICD-10-CM | POA: Diagnosis not present

## 2020-07-27 DIAGNOSIS — Z992 Dependence on renal dialysis: Secondary | ICD-10-CM | POA: Diagnosis not present

## 2020-07-27 DIAGNOSIS — N186 End stage renal disease: Secondary | ICD-10-CM | POA: Diagnosis not present

## 2020-07-27 DIAGNOSIS — E872 Acidosis: Secondary | ICD-10-CM | POA: Diagnosis not present

## 2020-07-27 DIAGNOSIS — E8770 Fluid overload, unspecified: Secondary | ICD-10-CM | POA: Diagnosis not present

## 2020-07-27 DIAGNOSIS — N2581 Secondary hyperparathyroidism of renal origin: Secondary | ICD-10-CM | POA: Diagnosis not present

## 2020-07-27 DIAGNOSIS — E875 Hyperkalemia: Secondary | ICD-10-CM | POA: Diagnosis not present

## 2020-07-27 DIAGNOSIS — I129 Hypertensive chronic kidney disease with stage 1 through stage 4 chronic kidney disease, or unspecified chronic kidney disease: Secondary | ICD-10-CM | POA: Diagnosis not present

## 2020-07-30 DIAGNOSIS — N186 End stage renal disease: Secondary | ICD-10-CM | POA: Diagnosis not present

## 2020-07-30 DIAGNOSIS — E875 Hyperkalemia: Secondary | ICD-10-CM | POA: Diagnosis not present

## 2020-07-30 DIAGNOSIS — I129 Hypertensive chronic kidney disease with stage 1 through stage 4 chronic kidney disease, or unspecified chronic kidney disease: Secondary | ICD-10-CM | POA: Diagnosis not present

## 2020-07-30 DIAGNOSIS — Z992 Dependence on renal dialysis: Secondary | ICD-10-CM | POA: Diagnosis not present

## 2020-07-30 DIAGNOSIS — E872 Acidosis: Secondary | ICD-10-CM | POA: Diagnosis not present

## 2020-07-30 DIAGNOSIS — E8770 Fluid overload, unspecified: Secondary | ICD-10-CM | POA: Diagnosis not present

## 2020-07-30 DIAGNOSIS — N2581 Secondary hyperparathyroidism of renal origin: Secondary | ICD-10-CM | POA: Diagnosis not present

## 2020-07-31 DIAGNOSIS — E8770 Fluid overload, unspecified: Secondary | ICD-10-CM | POA: Diagnosis not present

## 2020-07-31 DIAGNOSIS — E872 Acidosis: Secondary | ICD-10-CM | POA: Diagnosis not present

## 2020-07-31 DIAGNOSIS — I129 Hypertensive chronic kidney disease with stage 1 through stage 4 chronic kidney disease, or unspecified chronic kidney disease: Secondary | ICD-10-CM | POA: Diagnosis not present

## 2020-07-31 DIAGNOSIS — Z992 Dependence on renal dialysis: Secondary | ICD-10-CM | POA: Diagnosis not present

## 2020-07-31 DIAGNOSIS — N186 End stage renal disease: Secondary | ICD-10-CM | POA: Diagnosis not present

## 2020-07-31 DIAGNOSIS — E875 Hyperkalemia: Secondary | ICD-10-CM | POA: Diagnosis not present

## 2020-07-31 DIAGNOSIS — N2581 Secondary hyperparathyroidism of renal origin: Secondary | ICD-10-CM | POA: Diagnosis not present

## 2020-07-31 DIAGNOSIS — N049 Nephrotic syndrome with unspecified morphologic changes: Secondary | ICD-10-CM | POA: Diagnosis not present

## 2020-08-02 DIAGNOSIS — N186 End stage renal disease: Secondary | ICD-10-CM | POA: Diagnosis not present

## 2020-08-02 DIAGNOSIS — E872 Acidosis: Secondary | ICD-10-CM | POA: Diagnosis not present

## 2020-08-02 DIAGNOSIS — E875 Hyperkalemia: Secondary | ICD-10-CM | POA: Diagnosis not present

## 2020-08-02 DIAGNOSIS — N2581 Secondary hyperparathyroidism of renal origin: Secondary | ICD-10-CM | POA: Diagnosis not present

## 2020-08-02 DIAGNOSIS — I129 Hypertensive chronic kidney disease with stage 1 through stage 4 chronic kidney disease, or unspecified chronic kidney disease: Secondary | ICD-10-CM | POA: Diagnosis not present

## 2020-08-02 DIAGNOSIS — E8770 Fluid overload, unspecified: Secondary | ICD-10-CM | POA: Diagnosis not present

## 2020-08-02 DIAGNOSIS — Z992 Dependence on renal dialysis: Secondary | ICD-10-CM | POA: Diagnosis not present

## 2020-08-03 DIAGNOSIS — Z992 Dependence on renal dialysis: Secondary | ICD-10-CM | POA: Diagnosis not present

## 2020-08-03 DIAGNOSIS — E872 Acidosis: Secondary | ICD-10-CM | POA: Diagnosis not present

## 2020-08-03 DIAGNOSIS — I129 Hypertensive chronic kidney disease with stage 1 through stage 4 chronic kidney disease, or unspecified chronic kidney disease: Secondary | ICD-10-CM | POA: Diagnosis not present

## 2020-08-03 DIAGNOSIS — E8770 Fluid overload, unspecified: Secondary | ICD-10-CM | POA: Diagnosis not present

## 2020-08-03 DIAGNOSIS — N186 End stage renal disease: Secondary | ICD-10-CM | POA: Diagnosis not present

## 2020-08-03 DIAGNOSIS — E875 Hyperkalemia: Secondary | ICD-10-CM | POA: Diagnosis not present

## 2020-08-03 DIAGNOSIS — N2581 Secondary hyperparathyroidism of renal origin: Secondary | ICD-10-CM | POA: Diagnosis not present

## 2020-08-06 DIAGNOSIS — E872 Acidosis: Secondary | ICD-10-CM | POA: Diagnosis not present

## 2020-08-06 DIAGNOSIS — N2581 Secondary hyperparathyroidism of renal origin: Secondary | ICD-10-CM | POA: Diagnosis not present

## 2020-08-06 DIAGNOSIS — E875 Hyperkalemia: Secondary | ICD-10-CM | POA: Diagnosis not present

## 2020-08-06 DIAGNOSIS — E8770 Fluid overload, unspecified: Secondary | ICD-10-CM | POA: Diagnosis not present

## 2020-08-06 DIAGNOSIS — Z992 Dependence on renal dialysis: Secondary | ICD-10-CM | POA: Diagnosis not present

## 2020-08-06 DIAGNOSIS — N186 End stage renal disease: Secondary | ICD-10-CM | POA: Diagnosis not present

## 2020-08-06 DIAGNOSIS — I129 Hypertensive chronic kidney disease with stage 1 through stage 4 chronic kidney disease, or unspecified chronic kidney disease: Secondary | ICD-10-CM | POA: Diagnosis not present

## 2020-08-07 DIAGNOSIS — E875 Hyperkalemia: Secondary | ICD-10-CM | POA: Diagnosis not present

## 2020-08-07 DIAGNOSIS — I129 Hypertensive chronic kidney disease with stage 1 through stage 4 chronic kidney disease, or unspecified chronic kidney disease: Secondary | ICD-10-CM | POA: Diagnosis not present

## 2020-08-07 DIAGNOSIS — Z992 Dependence on renal dialysis: Secondary | ICD-10-CM | POA: Diagnosis not present

## 2020-08-07 DIAGNOSIS — N186 End stage renal disease: Secondary | ICD-10-CM | POA: Diagnosis not present

## 2020-08-07 DIAGNOSIS — E872 Acidosis: Secondary | ICD-10-CM | POA: Diagnosis not present

## 2020-08-07 DIAGNOSIS — E8770 Fluid overload, unspecified: Secondary | ICD-10-CM | POA: Diagnosis not present

## 2020-08-07 DIAGNOSIS — N2581 Secondary hyperparathyroidism of renal origin: Secondary | ICD-10-CM | POA: Diagnosis not present

## 2020-08-09 DIAGNOSIS — N2581 Secondary hyperparathyroidism of renal origin: Secondary | ICD-10-CM | POA: Diagnosis not present

## 2020-08-09 DIAGNOSIS — I129 Hypertensive chronic kidney disease with stage 1 through stage 4 chronic kidney disease, or unspecified chronic kidney disease: Secondary | ICD-10-CM | POA: Diagnosis not present

## 2020-08-09 DIAGNOSIS — E8770 Fluid overload, unspecified: Secondary | ICD-10-CM | POA: Diagnosis not present

## 2020-08-09 DIAGNOSIS — N186 End stage renal disease: Secondary | ICD-10-CM | POA: Diagnosis not present

## 2020-08-09 DIAGNOSIS — E872 Acidosis: Secondary | ICD-10-CM | POA: Diagnosis not present

## 2020-08-09 DIAGNOSIS — Z992 Dependence on renal dialysis: Secondary | ICD-10-CM | POA: Diagnosis not present

## 2020-08-09 DIAGNOSIS — E875 Hyperkalemia: Secondary | ICD-10-CM | POA: Diagnosis not present

## 2020-08-10 DIAGNOSIS — E8770 Fluid overload, unspecified: Secondary | ICD-10-CM | POA: Diagnosis not present

## 2020-08-10 DIAGNOSIS — Z992 Dependence on renal dialysis: Secondary | ICD-10-CM | POA: Diagnosis not present

## 2020-08-10 DIAGNOSIS — E872 Acidosis: Secondary | ICD-10-CM | POA: Diagnosis not present

## 2020-08-10 DIAGNOSIS — N2581 Secondary hyperparathyroidism of renal origin: Secondary | ICD-10-CM | POA: Diagnosis not present

## 2020-08-10 DIAGNOSIS — E875 Hyperkalemia: Secondary | ICD-10-CM | POA: Diagnosis not present

## 2020-08-10 DIAGNOSIS — I129 Hypertensive chronic kidney disease with stage 1 through stage 4 chronic kidney disease, or unspecified chronic kidney disease: Secondary | ICD-10-CM | POA: Diagnosis not present

## 2020-08-10 DIAGNOSIS — N186 End stage renal disease: Secondary | ICD-10-CM | POA: Diagnosis not present

## 2020-08-13 DIAGNOSIS — E875 Hyperkalemia: Secondary | ICD-10-CM | POA: Diagnosis not present

## 2020-08-13 DIAGNOSIS — Z992 Dependence on renal dialysis: Secondary | ICD-10-CM | POA: Diagnosis not present

## 2020-08-13 DIAGNOSIS — I129 Hypertensive chronic kidney disease with stage 1 through stage 4 chronic kidney disease, or unspecified chronic kidney disease: Secondary | ICD-10-CM | POA: Diagnosis not present

## 2020-08-13 DIAGNOSIS — E8770 Fluid overload, unspecified: Secondary | ICD-10-CM | POA: Diagnosis not present

## 2020-08-13 DIAGNOSIS — N2581 Secondary hyperparathyroidism of renal origin: Secondary | ICD-10-CM | POA: Diagnosis not present

## 2020-08-13 DIAGNOSIS — Z23 Encounter for immunization: Secondary | ICD-10-CM | POA: Diagnosis not present

## 2020-08-13 DIAGNOSIS — E872 Acidosis: Secondary | ICD-10-CM | POA: Diagnosis not present

## 2020-08-13 DIAGNOSIS — N186 End stage renal disease: Secondary | ICD-10-CM | POA: Diagnosis not present

## 2020-08-14 DIAGNOSIS — N186 End stage renal disease: Secondary | ICD-10-CM | POA: Diagnosis not present

## 2020-08-14 DIAGNOSIS — E8770 Fluid overload, unspecified: Secondary | ICD-10-CM | POA: Diagnosis not present

## 2020-08-14 DIAGNOSIS — E872 Acidosis: Secondary | ICD-10-CM | POA: Diagnosis not present

## 2020-08-14 DIAGNOSIS — N2581 Secondary hyperparathyroidism of renal origin: Secondary | ICD-10-CM | POA: Diagnosis not present

## 2020-08-14 DIAGNOSIS — E875 Hyperkalemia: Secondary | ICD-10-CM | POA: Diagnosis not present

## 2020-08-14 DIAGNOSIS — Z992 Dependence on renal dialysis: Secondary | ICD-10-CM | POA: Diagnosis not present

## 2020-08-14 DIAGNOSIS — I129 Hypertensive chronic kidney disease with stage 1 through stage 4 chronic kidney disease, or unspecified chronic kidney disease: Secondary | ICD-10-CM | POA: Diagnosis not present

## 2020-08-14 DIAGNOSIS — Z23 Encounter for immunization: Secondary | ICD-10-CM | POA: Diagnosis not present

## 2020-08-16 DIAGNOSIS — Z992 Dependence on renal dialysis: Secondary | ICD-10-CM | POA: Diagnosis not present

## 2020-08-16 DIAGNOSIS — Z23 Encounter for immunization: Secondary | ICD-10-CM | POA: Diagnosis not present

## 2020-08-16 DIAGNOSIS — E8770 Fluid overload, unspecified: Secondary | ICD-10-CM | POA: Diagnosis not present

## 2020-08-16 DIAGNOSIS — N2581 Secondary hyperparathyroidism of renal origin: Secondary | ICD-10-CM | POA: Diagnosis not present

## 2020-08-16 DIAGNOSIS — I129 Hypertensive chronic kidney disease with stage 1 through stage 4 chronic kidney disease, or unspecified chronic kidney disease: Secondary | ICD-10-CM | POA: Diagnosis not present

## 2020-08-16 DIAGNOSIS — E875 Hyperkalemia: Secondary | ICD-10-CM | POA: Diagnosis not present

## 2020-08-16 DIAGNOSIS — N186 End stage renal disease: Secondary | ICD-10-CM | POA: Diagnosis not present

## 2020-08-16 DIAGNOSIS — E872 Acidosis: Secondary | ICD-10-CM | POA: Diagnosis not present

## 2020-08-17 DIAGNOSIS — N2581 Secondary hyperparathyroidism of renal origin: Secondary | ICD-10-CM | POA: Diagnosis not present

## 2020-08-17 DIAGNOSIS — E875 Hyperkalemia: Secondary | ICD-10-CM | POA: Diagnosis not present

## 2020-08-17 DIAGNOSIS — I129 Hypertensive chronic kidney disease with stage 1 through stage 4 chronic kidney disease, or unspecified chronic kidney disease: Secondary | ICD-10-CM | POA: Diagnosis not present

## 2020-08-17 DIAGNOSIS — Z992 Dependence on renal dialysis: Secondary | ICD-10-CM | POA: Diagnosis not present

## 2020-08-17 DIAGNOSIS — Z23 Encounter for immunization: Secondary | ICD-10-CM | POA: Diagnosis not present

## 2020-08-17 DIAGNOSIS — E872 Acidosis: Secondary | ICD-10-CM | POA: Diagnosis not present

## 2020-08-17 DIAGNOSIS — N186 End stage renal disease: Secondary | ICD-10-CM | POA: Diagnosis not present

## 2020-08-17 DIAGNOSIS — E8770 Fluid overload, unspecified: Secondary | ICD-10-CM | POA: Diagnosis not present

## 2020-08-20 DIAGNOSIS — N2581 Secondary hyperparathyroidism of renal origin: Secondary | ICD-10-CM | POA: Diagnosis not present

## 2020-08-20 DIAGNOSIS — N186 End stage renal disease: Secondary | ICD-10-CM | POA: Diagnosis not present

## 2020-08-20 DIAGNOSIS — Z992 Dependence on renal dialysis: Secondary | ICD-10-CM | POA: Diagnosis not present

## 2020-08-21 DIAGNOSIS — Z992 Dependence on renal dialysis: Secondary | ICD-10-CM | POA: Diagnosis not present

## 2020-08-21 DIAGNOSIS — N2581 Secondary hyperparathyroidism of renal origin: Secondary | ICD-10-CM | POA: Diagnosis not present

## 2020-08-21 DIAGNOSIS — N186 End stage renal disease: Secondary | ICD-10-CM | POA: Diagnosis not present

## 2020-08-23 DIAGNOSIS — N186 End stage renal disease: Secondary | ICD-10-CM | POA: Diagnosis not present

## 2020-08-23 DIAGNOSIS — Z992 Dependence on renal dialysis: Secondary | ICD-10-CM | POA: Diagnosis not present

## 2020-08-23 DIAGNOSIS — N2581 Secondary hyperparathyroidism of renal origin: Secondary | ICD-10-CM | POA: Diagnosis not present

## 2020-08-24 DIAGNOSIS — N186 End stage renal disease: Secondary | ICD-10-CM | POA: Diagnosis not present

## 2020-08-24 DIAGNOSIS — N2581 Secondary hyperparathyroidism of renal origin: Secondary | ICD-10-CM | POA: Diagnosis not present

## 2020-08-24 DIAGNOSIS — Z992 Dependence on renal dialysis: Secondary | ICD-10-CM | POA: Diagnosis not present

## 2020-08-27 DIAGNOSIS — N2581 Secondary hyperparathyroidism of renal origin: Secondary | ICD-10-CM | POA: Diagnosis not present

## 2020-08-27 DIAGNOSIS — N186 End stage renal disease: Secondary | ICD-10-CM | POA: Diagnosis not present

## 2020-08-27 DIAGNOSIS — Z992 Dependence on renal dialysis: Secondary | ICD-10-CM | POA: Diagnosis not present

## 2020-08-28 DIAGNOSIS — N186 End stage renal disease: Secondary | ICD-10-CM | POA: Diagnosis not present

## 2020-08-28 DIAGNOSIS — Z992 Dependence on renal dialysis: Secondary | ICD-10-CM | POA: Diagnosis not present

## 2020-08-28 DIAGNOSIS — N2581 Secondary hyperparathyroidism of renal origin: Secondary | ICD-10-CM | POA: Diagnosis not present

## 2020-08-30 DIAGNOSIS — N186 End stage renal disease: Secondary | ICD-10-CM | POA: Diagnosis not present

## 2020-08-30 DIAGNOSIS — Z992 Dependence on renal dialysis: Secondary | ICD-10-CM | POA: Diagnosis not present

## 2020-08-30 DIAGNOSIS — N049 Nephrotic syndrome with unspecified morphologic changes: Secondary | ICD-10-CM | POA: Diagnosis not present

## 2020-08-30 DIAGNOSIS — N2581 Secondary hyperparathyroidism of renal origin: Secondary | ICD-10-CM | POA: Diagnosis not present

## 2020-08-31 DIAGNOSIS — Z992 Dependence on renal dialysis: Secondary | ICD-10-CM | POA: Diagnosis not present

## 2020-08-31 DIAGNOSIS — N2581 Secondary hyperparathyroidism of renal origin: Secondary | ICD-10-CM | POA: Diagnosis not present

## 2020-08-31 DIAGNOSIS — N186 End stage renal disease: Secondary | ICD-10-CM | POA: Diagnosis not present

## 2020-09-03 DIAGNOSIS — N2581 Secondary hyperparathyroidism of renal origin: Secondary | ICD-10-CM | POA: Diagnosis not present

## 2020-09-03 DIAGNOSIS — N186 End stage renal disease: Secondary | ICD-10-CM | POA: Diagnosis not present

## 2020-09-03 DIAGNOSIS — Z992 Dependence on renal dialysis: Secondary | ICD-10-CM | POA: Diagnosis not present

## 2020-09-04 DIAGNOSIS — N186 End stage renal disease: Secondary | ICD-10-CM | POA: Diagnosis not present

## 2020-09-04 DIAGNOSIS — Z992 Dependence on renal dialysis: Secondary | ICD-10-CM | POA: Diagnosis not present

## 2020-09-04 DIAGNOSIS — N2581 Secondary hyperparathyroidism of renal origin: Secondary | ICD-10-CM | POA: Diagnosis not present

## 2020-09-06 DIAGNOSIS — N2581 Secondary hyperparathyroidism of renal origin: Secondary | ICD-10-CM | POA: Diagnosis not present

## 2020-09-06 DIAGNOSIS — N186 End stage renal disease: Secondary | ICD-10-CM | POA: Diagnosis not present

## 2020-09-06 DIAGNOSIS — Z992 Dependence on renal dialysis: Secondary | ICD-10-CM | POA: Diagnosis not present

## 2020-09-07 DIAGNOSIS — Z992 Dependence on renal dialysis: Secondary | ICD-10-CM | POA: Diagnosis not present

## 2020-09-07 DIAGNOSIS — N186 End stage renal disease: Secondary | ICD-10-CM | POA: Diagnosis not present

## 2020-09-07 DIAGNOSIS — N2581 Secondary hyperparathyroidism of renal origin: Secondary | ICD-10-CM | POA: Diagnosis not present

## 2020-09-10 DIAGNOSIS — Z992 Dependence on renal dialysis: Secondary | ICD-10-CM | POA: Diagnosis not present

## 2020-09-10 DIAGNOSIS — N2581 Secondary hyperparathyroidism of renal origin: Secondary | ICD-10-CM | POA: Diagnosis not present

## 2020-09-10 DIAGNOSIS — N186 End stage renal disease: Secondary | ICD-10-CM | POA: Diagnosis not present

## 2020-09-11 DIAGNOSIS — Z992 Dependence on renal dialysis: Secondary | ICD-10-CM | POA: Diagnosis not present

## 2020-09-11 DIAGNOSIS — N186 End stage renal disease: Secondary | ICD-10-CM | POA: Diagnosis not present

## 2020-09-11 DIAGNOSIS — N2581 Secondary hyperparathyroidism of renal origin: Secondary | ICD-10-CM | POA: Diagnosis not present

## 2020-09-13 DIAGNOSIS — N2581 Secondary hyperparathyroidism of renal origin: Secondary | ICD-10-CM | POA: Diagnosis not present

## 2020-09-13 DIAGNOSIS — N186 End stage renal disease: Secondary | ICD-10-CM | POA: Diagnosis not present

## 2020-09-13 DIAGNOSIS — Z992 Dependence on renal dialysis: Secondary | ICD-10-CM | POA: Diagnosis not present

## 2020-09-14 DIAGNOSIS — N186 End stage renal disease: Secondary | ICD-10-CM | POA: Diagnosis not present

## 2020-09-14 DIAGNOSIS — Z992 Dependence on renal dialysis: Secondary | ICD-10-CM | POA: Diagnosis not present

## 2020-09-14 DIAGNOSIS — N2581 Secondary hyperparathyroidism of renal origin: Secondary | ICD-10-CM | POA: Diagnosis not present

## 2020-09-16 ENCOUNTER — Other Ambulatory Visit: Payer: Self-pay | Admitting: Registered Nurse

## 2020-09-16 DIAGNOSIS — E785 Hyperlipidemia, unspecified: Secondary | ICD-10-CM

## 2020-09-17 DIAGNOSIS — Z992 Dependence on renal dialysis: Secondary | ICD-10-CM | POA: Diagnosis not present

## 2020-09-17 DIAGNOSIS — N2581 Secondary hyperparathyroidism of renal origin: Secondary | ICD-10-CM | POA: Diagnosis not present

## 2020-09-17 DIAGNOSIS — N186 End stage renal disease: Secondary | ICD-10-CM | POA: Diagnosis not present

## 2020-09-18 DIAGNOSIS — N186 End stage renal disease: Secondary | ICD-10-CM | POA: Diagnosis not present

## 2020-09-18 DIAGNOSIS — N2581 Secondary hyperparathyroidism of renal origin: Secondary | ICD-10-CM | POA: Diagnosis not present

## 2020-09-18 DIAGNOSIS — Z992 Dependence on renal dialysis: Secondary | ICD-10-CM | POA: Diagnosis not present

## 2020-09-20 DIAGNOSIS — Z992 Dependence on renal dialysis: Secondary | ICD-10-CM | POA: Diagnosis not present

## 2020-09-20 DIAGNOSIS — N2581 Secondary hyperparathyroidism of renal origin: Secondary | ICD-10-CM | POA: Diagnosis not present

## 2020-09-20 DIAGNOSIS — N186 End stage renal disease: Secondary | ICD-10-CM | POA: Diagnosis not present

## 2020-09-21 DIAGNOSIS — Z992 Dependence on renal dialysis: Secondary | ICD-10-CM | POA: Diagnosis not present

## 2020-09-21 DIAGNOSIS — N186 End stage renal disease: Secondary | ICD-10-CM | POA: Diagnosis not present

## 2020-09-21 DIAGNOSIS — N2581 Secondary hyperparathyroidism of renal origin: Secondary | ICD-10-CM | POA: Diagnosis not present

## 2020-09-24 DIAGNOSIS — N186 End stage renal disease: Secondary | ICD-10-CM | POA: Diagnosis not present

## 2020-09-24 DIAGNOSIS — Z992 Dependence on renal dialysis: Secondary | ICD-10-CM | POA: Diagnosis not present

## 2020-09-24 DIAGNOSIS — N2581 Secondary hyperparathyroidism of renal origin: Secondary | ICD-10-CM | POA: Diagnosis not present

## 2020-09-25 DIAGNOSIS — N2581 Secondary hyperparathyroidism of renal origin: Secondary | ICD-10-CM | POA: Diagnosis not present

## 2020-09-25 DIAGNOSIS — Z992 Dependence on renal dialysis: Secondary | ICD-10-CM | POA: Diagnosis not present

## 2020-09-25 DIAGNOSIS — N186 End stage renal disease: Secondary | ICD-10-CM | POA: Diagnosis not present

## 2020-09-27 DIAGNOSIS — Z992 Dependence on renal dialysis: Secondary | ICD-10-CM | POA: Diagnosis not present

## 2020-09-27 DIAGNOSIS — N2581 Secondary hyperparathyroidism of renal origin: Secondary | ICD-10-CM | POA: Diagnosis not present

## 2020-09-27 DIAGNOSIS — N186 End stage renal disease: Secondary | ICD-10-CM | POA: Diagnosis not present

## 2020-09-28 DIAGNOSIS — N2581 Secondary hyperparathyroidism of renal origin: Secondary | ICD-10-CM | POA: Diagnosis not present

## 2020-09-28 DIAGNOSIS — Z992 Dependence on renal dialysis: Secondary | ICD-10-CM | POA: Diagnosis not present

## 2020-09-28 DIAGNOSIS — N186 End stage renal disease: Secondary | ICD-10-CM | POA: Diagnosis not present

## 2020-09-30 DIAGNOSIS — Z992 Dependence on renal dialysis: Secondary | ICD-10-CM | POA: Diagnosis not present

## 2020-09-30 DIAGNOSIS — N049 Nephrotic syndrome with unspecified morphologic changes: Secondary | ICD-10-CM | POA: Diagnosis not present

## 2020-09-30 DIAGNOSIS — N186 End stage renal disease: Secondary | ICD-10-CM | POA: Diagnosis not present

## 2020-10-01 DIAGNOSIS — Z992 Dependence on renal dialysis: Secondary | ICD-10-CM | POA: Diagnosis not present

## 2020-10-01 DIAGNOSIS — N186 End stage renal disease: Secondary | ICD-10-CM | POA: Diagnosis not present

## 2020-10-01 DIAGNOSIS — N2581 Secondary hyperparathyroidism of renal origin: Secondary | ICD-10-CM | POA: Diagnosis not present

## 2020-10-02 DIAGNOSIS — N2581 Secondary hyperparathyroidism of renal origin: Secondary | ICD-10-CM | POA: Diagnosis not present

## 2020-10-02 DIAGNOSIS — N186 End stage renal disease: Secondary | ICD-10-CM | POA: Diagnosis not present

## 2020-10-02 DIAGNOSIS — Z992 Dependence on renal dialysis: Secondary | ICD-10-CM | POA: Diagnosis not present

## 2020-10-04 DIAGNOSIS — N186 End stage renal disease: Secondary | ICD-10-CM | POA: Diagnosis not present

## 2020-10-04 DIAGNOSIS — Z992 Dependence on renal dialysis: Secondary | ICD-10-CM | POA: Diagnosis not present

## 2020-10-04 DIAGNOSIS — N2581 Secondary hyperparathyroidism of renal origin: Secondary | ICD-10-CM | POA: Diagnosis not present

## 2020-10-05 DIAGNOSIS — Z992 Dependence on renal dialysis: Secondary | ICD-10-CM | POA: Diagnosis not present

## 2020-10-05 DIAGNOSIS — N2581 Secondary hyperparathyroidism of renal origin: Secondary | ICD-10-CM | POA: Diagnosis not present

## 2020-10-05 DIAGNOSIS — N186 End stage renal disease: Secondary | ICD-10-CM | POA: Diagnosis not present

## 2020-10-08 DIAGNOSIS — N2581 Secondary hyperparathyroidism of renal origin: Secondary | ICD-10-CM | POA: Diagnosis not present

## 2020-10-08 DIAGNOSIS — N186 End stage renal disease: Secondary | ICD-10-CM | POA: Diagnosis not present

## 2020-10-08 DIAGNOSIS — Z992 Dependence on renal dialysis: Secondary | ICD-10-CM | POA: Diagnosis not present

## 2020-10-09 DIAGNOSIS — Z992 Dependence on renal dialysis: Secondary | ICD-10-CM | POA: Diagnosis not present

## 2020-10-09 DIAGNOSIS — N2581 Secondary hyperparathyroidism of renal origin: Secondary | ICD-10-CM | POA: Diagnosis not present

## 2020-10-09 DIAGNOSIS — N186 End stage renal disease: Secondary | ICD-10-CM | POA: Diagnosis not present

## 2020-10-11 DIAGNOSIS — Z992 Dependence on renal dialysis: Secondary | ICD-10-CM | POA: Diagnosis not present

## 2020-10-11 DIAGNOSIS — N186 End stage renal disease: Secondary | ICD-10-CM | POA: Diagnosis not present

## 2020-10-11 DIAGNOSIS — N2581 Secondary hyperparathyroidism of renal origin: Secondary | ICD-10-CM | POA: Diagnosis not present

## 2020-10-12 DIAGNOSIS — Z992 Dependence on renal dialysis: Secondary | ICD-10-CM | POA: Diagnosis not present

## 2020-10-12 DIAGNOSIS — N186 End stage renal disease: Secondary | ICD-10-CM | POA: Diagnosis not present

## 2020-10-12 DIAGNOSIS — N2581 Secondary hyperparathyroidism of renal origin: Secondary | ICD-10-CM | POA: Diagnosis not present

## 2020-10-15 DIAGNOSIS — Z992 Dependence on renal dialysis: Secondary | ICD-10-CM | POA: Diagnosis not present

## 2020-10-15 DIAGNOSIS — N186 End stage renal disease: Secondary | ICD-10-CM | POA: Diagnosis not present

## 2020-10-15 DIAGNOSIS — N2581 Secondary hyperparathyroidism of renal origin: Secondary | ICD-10-CM | POA: Diagnosis not present

## 2020-10-16 DIAGNOSIS — N2581 Secondary hyperparathyroidism of renal origin: Secondary | ICD-10-CM | POA: Diagnosis not present

## 2020-10-16 DIAGNOSIS — Z992 Dependence on renal dialysis: Secondary | ICD-10-CM | POA: Diagnosis not present

## 2020-10-16 DIAGNOSIS — N186 End stage renal disease: Secondary | ICD-10-CM | POA: Diagnosis not present

## 2020-10-18 DIAGNOSIS — N186 End stage renal disease: Secondary | ICD-10-CM | POA: Diagnosis not present

## 2020-10-18 DIAGNOSIS — N2581 Secondary hyperparathyroidism of renal origin: Secondary | ICD-10-CM | POA: Diagnosis not present

## 2020-10-18 DIAGNOSIS — Z992 Dependence on renal dialysis: Secondary | ICD-10-CM | POA: Diagnosis not present

## 2020-10-19 DIAGNOSIS — N2581 Secondary hyperparathyroidism of renal origin: Secondary | ICD-10-CM | POA: Diagnosis not present

## 2020-10-19 DIAGNOSIS — N186 End stage renal disease: Secondary | ICD-10-CM | POA: Diagnosis not present

## 2020-10-19 DIAGNOSIS — Z992 Dependence on renal dialysis: Secondary | ICD-10-CM | POA: Diagnosis not present

## 2020-10-22 DIAGNOSIS — N186 End stage renal disease: Secondary | ICD-10-CM | POA: Diagnosis not present

## 2020-10-22 DIAGNOSIS — Z992 Dependence on renal dialysis: Secondary | ICD-10-CM | POA: Diagnosis not present

## 2020-10-22 DIAGNOSIS — N2581 Secondary hyperparathyroidism of renal origin: Secondary | ICD-10-CM | POA: Diagnosis not present

## 2020-10-23 DIAGNOSIS — Z992 Dependence on renal dialysis: Secondary | ICD-10-CM | POA: Diagnosis not present

## 2020-10-23 DIAGNOSIS — N186 End stage renal disease: Secondary | ICD-10-CM | POA: Diagnosis not present

## 2020-10-23 DIAGNOSIS — N2581 Secondary hyperparathyroidism of renal origin: Secondary | ICD-10-CM | POA: Diagnosis not present

## 2020-10-25 DIAGNOSIS — N2581 Secondary hyperparathyroidism of renal origin: Secondary | ICD-10-CM | POA: Diagnosis not present

## 2020-10-25 DIAGNOSIS — Z992 Dependence on renal dialysis: Secondary | ICD-10-CM | POA: Diagnosis not present

## 2020-10-25 DIAGNOSIS — N186 End stage renal disease: Secondary | ICD-10-CM | POA: Diagnosis not present

## 2020-10-26 DIAGNOSIS — N2581 Secondary hyperparathyroidism of renal origin: Secondary | ICD-10-CM | POA: Diagnosis not present

## 2020-10-26 DIAGNOSIS — N186 End stage renal disease: Secondary | ICD-10-CM | POA: Diagnosis not present

## 2020-10-26 DIAGNOSIS — Z992 Dependence on renal dialysis: Secondary | ICD-10-CM | POA: Diagnosis not present

## 2020-10-29 DIAGNOSIS — N186 End stage renal disease: Secondary | ICD-10-CM | POA: Diagnosis not present

## 2020-10-29 DIAGNOSIS — Z992 Dependence on renal dialysis: Secondary | ICD-10-CM | POA: Diagnosis not present

## 2020-10-29 DIAGNOSIS — N2581 Secondary hyperparathyroidism of renal origin: Secondary | ICD-10-CM | POA: Diagnosis not present

## 2020-10-30 DIAGNOSIS — Z992 Dependence on renal dialysis: Secondary | ICD-10-CM | POA: Diagnosis not present

## 2020-10-30 DIAGNOSIS — N186 End stage renal disease: Secondary | ICD-10-CM | POA: Diagnosis not present

## 2020-10-30 DIAGNOSIS — N2581 Secondary hyperparathyroidism of renal origin: Secondary | ICD-10-CM | POA: Diagnosis not present

## 2020-10-31 DIAGNOSIS — N186 End stage renal disease: Secondary | ICD-10-CM | POA: Diagnosis not present

## 2020-10-31 DIAGNOSIS — Z992 Dependence on renal dialysis: Secondary | ICD-10-CM | POA: Diagnosis not present

## 2020-10-31 DIAGNOSIS — N049 Nephrotic syndrome with unspecified morphologic changes: Secondary | ICD-10-CM | POA: Diagnosis not present

## 2020-11-01 DIAGNOSIS — Z992 Dependence on renal dialysis: Secondary | ICD-10-CM | POA: Diagnosis not present

## 2020-11-01 DIAGNOSIS — N186 End stage renal disease: Secondary | ICD-10-CM | POA: Diagnosis not present

## 2020-11-01 DIAGNOSIS — N2581 Secondary hyperparathyroidism of renal origin: Secondary | ICD-10-CM | POA: Diagnosis not present

## 2020-11-02 DIAGNOSIS — N2581 Secondary hyperparathyroidism of renal origin: Secondary | ICD-10-CM | POA: Diagnosis not present

## 2020-11-02 DIAGNOSIS — N186 End stage renal disease: Secondary | ICD-10-CM | POA: Diagnosis not present

## 2020-11-02 DIAGNOSIS — Z992 Dependence on renal dialysis: Secondary | ICD-10-CM | POA: Diagnosis not present

## 2020-11-05 DIAGNOSIS — Z992 Dependence on renal dialysis: Secondary | ICD-10-CM | POA: Diagnosis not present

## 2020-11-05 DIAGNOSIS — N2581 Secondary hyperparathyroidism of renal origin: Secondary | ICD-10-CM | POA: Diagnosis not present

## 2020-11-05 DIAGNOSIS — N186 End stage renal disease: Secondary | ICD-10-CM | POA: Diagnosis not present

## 2020-11-06 DIAGNOSIS — I120 Hypertensive chronic kidney disease with stage 5 chronic kidney disease or end stage renal disease: Secondary | ICD-10-CM | POA: Diagnosis not present

## 2020-11-06 DIAGNOSIS — G4733 Obstructive sleep apnea (adult) (pediatric): Secondary | ICD-10-CM | POA: Diagnosis not present

## 2020-11-06 DIAGNOSIS — Z1159 Encounter for screening for other viral diseases: Secondary | ICD-10-CM | POA: Diagnosis not present

## 2020-11-06 DIAGNOSIS — Z992 Dependence on renal dialysis: Secondary | ICD-10-CM | POA: Diagnosis not present

## 2020-11-06 DIAGNOSIS — Z114 Encounter for screening for human immunodeficiency virus [HIV]: Secondary | ICD-10-CM | POA: Diagnosis not present

## 2020-11-06 DIAGNOSIS — Z7901 Long term (current) use of anticoagulants: Secondary | ICD-10-CM | POA: Diagnosis not present

## 2020-11-06 DIAGNOSIS — Z23 Encounter for immunization: Secondary | ICD-10-CM | POA: Diagnosis not present

## 2020-11-06 DIAGNOSIS — Z7682 Awaiting organ transplant status: Secondary | ICD-10-CM | POA: Diagnosis not present

## 2020-11-06 DIAGNOSIS — Z86718 Personal history of other venous thrombosis and embolism: Secondary | ICD-10-CM | POA: Diagnosis not present

## 2020-11-06 DIAGNOSIS — G4730 Sleep apnea, unspecified: Secondary | ICD-10-CM | POA: Diagnosis not present

## 2020-11-06 DIAGNOSIS — Z111 Encounter for screening for respiratory tuberculosis: Secondary | ICD-10-CM | POA: Diagnosis not present

## 2020-11-06 DIAGNOSIS — Z87440 Personal history of urinary (tract) infections: Secondary | ICD-10-CM | POA: Diagnosis not present

## 2020-11-06 DIAGNOSIS — N2581 Secondary hyperparathyroidism of renal origin: Secondary | ICD-10-CM | POA: Diagnosis not present

## 2020-11-06 DIAGNOSIS — Z01818 Encounter for other preprocedural examination: Secondary | ICD-10-CM | POA: Diagnosis not present

## 2020-11-06 DIAGNOSIS — N186 End stage renal disease: Secondary | ICD-10-CM | POA: Diagnosis not present

## 2020-11-08 DIAGNOSIS — N2581 Secondary hyperparathyroidism of renal origin: Secondary | ICD-10-CM | POA: Diagnosis not present

## 2020-11-08 DIAGNOSIS — Z992 Dependence on renal dialysis: Secondary | ICD-10-CM | POA: Diagnosis not present

## 2020-11-08 DIAGNOSIS — N186 End stage renal disease: Secondary | ICD-10-CM | POA: Diagnosis not present

## 2020-11-09 DIAGNOSIS — N2581 Secondary hyperparathyroidism of renal origin: Secondary | ICD-10-CM | POA: Diagnosis not present

## 2020-11-09 DIAGNOSIS — Z992 Dependence on renal dialysis: Secondary | ICD-10-CM | POA: Diagnosis not present

## 2020-11-09 DIAGNOSIS — N186 End stage renal disease: Secondary | ICD-10-CM | POA: Diagnosis not present

## 2020-11-11 ENCOUNTER — Other Ambulatory Visit: Payer: Self-pay

## 2020-11-11 ENCOUNTER — Emergency Department
Admission: EM | Admit: 2020-11-11 | Discharge: 2020-11-12 | Disposition: A | Discharge status: Home / Self Care | Payer: Federal, State, Local not specified - PPO | Attending: Emergency Medicine | Admitting: Emergency Medicine

## 2020-11-11 DIAGNOSIS — Z79899 Other long term (current) drug therapy: Secondary | ICD-10-CM | POA: Insufficient documentation

## 2020-11-11 DIAGNOSIS — R55 Syncope and collapse: Secondary | ICD-10-CM | POA: Diagnosis not present

## 2020-11-11 DIAGNOSIS — E860 Dehydration: Secondary | ICD-10-CM | POA: Diagnosis not present

## 2020-11-11 DIAGNOSIS — Z992 Dependence on renal dialysis: Secondary | ICD-10-CM | POA: Diagnosis not present

## 2020-11-11 DIAGNOSIS — R42 Dizziness and giddiness: Secondary | ICD-10-CM | POA: Diagnosis not present

## 2020-11-11 DIAGNOSIS — I10 Essential (primary) hypertension: Secondary | ICD-10-CM | POA: Diagnosis not present

## 2020-11-11 DIAGNOSIS — I517 Cardiomegaly: Secondary | ICD-10-CM | POA: Diagnosis not present

## 2020-11-11 DIAGNOSIS — I129 Hypertensive chronic kidney disease with stage 1 through stage 4 chronic kidney disease, or unspecified chronic kidney disease: Secondary | ICD-10-CM | POA: Insufficient documentation

## 2020-11-11 DIAGNOSIS — I951 Orthostatic hypotension: Secondary | ICD-10-CM

## 2020-11-11 DIAGNOSIS — R000 Tachycardia, unspecified: Secondary | ICD-10-CM | POA: Diagnosis not present

## 2020-11-11 DIAGNOSIS — N189 Chronic kidney disease, unspecified: Secondary | ICD-10-CM | POA: Insufficient documentation

## 2020-11-11 LAB — COMPREHENSIVE METABOLIC PANEL
ALT: 15 U/L (ref 0–44)
AST: 14 U/L — ABNORMAL LOW (ref 15–41)
Albumin: 4.2 g/dL (ref 3.5–5.0)
Alkaline Phosphatase: 56 U/L (ref 38–126)
Anion gap: 10 (ref 5–15)
BUN: 68 mg/dL — ABNORMAL HIGH (ref 6–20)
CO2: 29 mmol/L (ref 22–32)
Calcium: 8.8 mg/dL — ABNORMAL LOW (ref 8.9–10.3)
Chloride: 98 mmol/L (ref 98–111)
Creatinine, Ser: 9.12 mg/dL — ABNORMAL HIGH (ref 0.61–1.24)
GFR, Estimated: 7 mL/min — ABNORMAL LOW (ref >60)
Glucose, Bld: 97 mg/dL (ref 70–99)
Potassium: 4.6 mmol/L (ref 3.5–5.1)
Sodium: 137 mmol/L (ref 135–145)
Total Bilirubin: 0.7 mg/dL (ref 0.3–1.2)
Total Protein: 7.5 g/dL (ref 6.5–8.1)

## 2020-11-11 LAB — CBC
HCT: 32.1 % — ABNORMAL LOW (ref 39.0–52.0)
Hemoglobin: 10.3 g/dL — ABNORMAL LOW (ref 13.0–17.0)
MCH: 31.3 pg (ref 26.0–34.0)
MCHC: 32.1 g/dL (ref 30.0–36.0)
MCV: 97.6 fL (ref 80.0–100.0)
Platelets: 157 10*3/uL (ref 150–400)
RBC: 3.29 MIL/uL — ABNORMAL LOW (ref 4.22–5.81)
RDW: 14.4 % (ref 11.5–15.5)
WBC: 9.2 10*3/uL (ref 4.0–10.5)
nRBC: 0 % (ref 0.0–0.2)

## 2020-11-11 LAB — TROPONIN I (HIGH SENSITIVITY): Troponin I (High Sensitivity): 31 ng/L — ABNORMAL HIGH (ref <18)

## 2020-11-11 MED ORDER — SODIUM CHLORIDE 0.9 % IV BOLUS: 500.0000 mL | Freq: Once | INTRAVENOUS | Status: DC

## 2020-11-11 NOTE — ED Provider Notes (Signed)
[facility] Emergency Department Provider Note   ____________________________________________   Event Date/Time   First MD Initiated Contact with Patient 11/11/20 2321     (approximate)  I have reviewed the triage vital signs and the nursing notes.   HISTORY  Chief Complaint Loss of Consciousness    HPI Hector Brooks is a 50 y.o. male who presents to the ED from home with a chief complaint of near syncope.  Patient was doing home hemodialysis tonight.  He was about an hour into the dialysis and asleep, woke up with lightheaded sensation, called for his wife and had near syncopal episode.  Wife states patient did not fully pass out.  Patient vomited afterwards.  Denies chest pain to this examiner.  Usually alternates days for his hemodialysis but this week due to his schedule he planned to dialyze Thursday, Friday, Saturday and Sunday.  He did skip yesterday for weakness.  States he felt like his blood pressure was dropping before near syncope.  Denies recent fever, cough, shortness of breath, abdominal pain, nausea or vomiting.  Makes very little urine.  Denies COVID exposure.  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   Sleep apnea    does not use cpap   Vitamin D deficiency     Patient Active Problem List   Diagnosis Date Noted   Anemia in chronic kidney disease 09/26/2019   Primary osteoarthritis of right knee 08/23/2019   Body mass index 45.0-49.9, adult ([place]) 08/23/2019   Obstructive sleep apnea 01/25/2019   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   Generalized edema 07/04/2017   History of DVT of  lower extremity 07/04/2017   Morbid obesity ([place]) 07/04/2017   Pure hypercholesterolemia 07/04/2017   Prediabetes 07/04/2017   HTN (hypertension) 05/21/2016   DVT, femoral, chronic ([place]) 09/29/2014    Past Surgical History:  Procedure Laterality Date   AV FISTULA PLACEMENT Left 02/09/2020   Procedure: LEFT ARM FISTULA CREATION;  Surgeon: Serafina Mitchell, MD;  Location: [facility];  Service: Vascular;  Laterality: Left;   INSERTION OF DIALYSIS CATHETER Right 05/22/2020   Procedure: INSERTION OF TUNNELED DIALYSIS CATHETER;  Surgeon: Angelia Mould, MD;  Location: [facility] OR;  Service: Vascular;  Laterality: Right;   MENISCUS REPAIR Right 12/14    Prior to Admission medications   Medication Sig Start Date End Date Taking? Authorizing Provider  amLODipine (NORVASC) 10 MG tablet Take 10 mg by mouth daily. 07/29/19   [provider]  atorvastatin (LIPITOR) 80 MG tablet TAKE 1/2 TABLET BY MOUTH DAILY 09/17/20   Maximiano Coss, NP  calcitRIOL (ROCALTROL) 0.25 MCG capsule Take 0.25 mcg by mouth daily. 08/31/19   [provider]  calcium acetate (PHOSLO) 667 MG capsule Take 1,334 mg by mouth 3 (three) times daily. With a meal 01/17/20   [provider]  FEROSUL 325 (65 Fe) MG tablet Take 325 mg by mouth daily. 02/28/19   [provider]  furosemide (LASIX) 40 MG tablet Take 1 tablet (40 mg total) by mouth daily. 08/16/19   Maximiano Coss, NP  hydrALAZINE (APRESOLINE) 50 MG tablet  TAKE 1 TABLET(50 MG) BY MOUTH THREE TIMES DAILY 07/10/20   Lorretta Harp, MD  labetalol (NORMODYNE) 300 MG tablet Take 1 tablet (300 mg total) by mouth 2 (two) times daily. 08/16/19   Maximiano Coss, NP  oxyCODONE (ROXICODONE) 5 MG immediate release tablet Take 1 tablet (5 mg total) by mouth every 4 (four) hours as needed. 05/22/20   Angelia Mould, MD  sodium bicarbonate 650 MG tablet Take 650 mg by mouth 2 (two) times daily.    [provider]    Allergies Patient has  no known allergies.  Family History  Problem Relation Age of Onset   Cancer Father    Diabetes Father    Drug abuse Father    Hyperlipidemia Sister    Diabetes Maternal Grandfather    Heart disease Maternal Grandfather     Social History Social History   Tobacco Use   Smoking status: Never   Smokeless tobacco: Never  Vaping Use   Vaping Use: Never used  Substance Use Topics   Alcohol use: No    Alcohol/week: 0.0 standard drinks   Drug use: No    Review of Systems  Constitutional: No fever/chills Eyes: No visual changes. ENT: No sore throat. Cardiovascular: Denies chest pain. Respiratory: Denies shortness of breath. Gastrointestinal: No abdominal pain.  Positive for vomiting.  No diarrhea.  No constipation. Genitourinary: Negative for dysuria. Musculoskeletal: Negative for back pain. Skin: Negative for rash. Neurological: Positive for near syncope.  Negative for headaches, focal weakness or numbness.   ____________________________________________   PHYSICAL EXAM:  VITAL SIGNS: ED Triage Vitals  Enc Vitals Group     BP 11/11/20 2148 101/72     Pulse Rate 11/11/20 2148 97     Resp 11/11/20 2148 16     Temp 11/11/20 2148 98.2 F (36.8 C)     Temp Source 11/11/20 2148 Oral     SpO2 11/11/20 2148 100 %     Weight 11/11/20 2145 280 lb (127 kg)     Height 11/11/20 2145 5\' 9"  (1.753 m)     Head Circumference --      Peak Flow --      Pain Score 11/11/20 2149 4     Pain Loc --      Pain Edu? --      Excl. in [place]? --     Constitutional: Alert and oriented.  Chronically ill appearing and in no acute distress. Eyes: Conjunctivae are normal. PERRL. EOMI. Head: Atraumatic. Nose: No congestion/rhinnorhea. Mouth/Throat: Mucous membranes are mildly dry. Neck: No stridor.   Cardiovascular: Normal rate, regular rhythm. Grossly normal heart sounds.  Good peripheral circulation. Respiratory: Normal respiratory effort.  No retractions. Lungs CTAB. Gastrointestinal:  Soft and nontender. No distention. No abdominal bruits. No CVA tenderness. Musculoskeletal: LUE dialysis access.  No lower extremity tenderness nor edema.  No joint effusions. Neurologic: Alert and oriented x3.  CN II to XII grossly intact.  Normal speech and language. No gross focal neurologic deficits are appreciated. No gait instability. Skin:  Skin is warm, dry and intact. No rash noted. Psychiatric: Mood and affect are normal. Speech and behavior are normal.  ____________________________________________   LABS (all labs ordered are listed, but only abnormal results are displayed)  Labs Reviewed  CBC - Abnormal; Notable for the following components:      Result Value   RBC 3.29 (*)    Hemoglobin 10.3 (*)    HCT 32.1 (*)    All other components within  normal limits  COMPREHENSIVE METABOLIC PANEL - Abnormal; Notable for the following components:   BUN 68 (*)    Creatinine, Ser 9.12 (*)    Calcium 8.8 (*)    AST 14 (*)    GFR, Estimated 7 (*)    All other components within normal limits  TROPONIN I (HIGH SENSITIVITY) - Abnormal; Notable for the following components:   Troponin I (High Sensitivity) 31 (*)    All other components within normal limits  CBG MONITORING, ED   ____________________________________________  EKG  ED ECG REPORT I, Cythina Mickelsen J, the attending physician, personally viewed and interpreted this ECG.   Date: 11/12/2020  EKG Time: 2150  Rate: 106  Rhythm: sinus tachycardia  Axis: Normal  Intervals:none  ST&T Change: Nonspecific  ____________________________________________  RADIOLOGY I, Daziyah Cogan J, personally viewed and evaluated these images (plain radiographs) as part of my medical decision making, as well as reviewing the written report by the radiologist.  ED MD interpretation: No acute cardiopulmonary process  Official radiology report(s): DG Chest Port 1 View  Result Date: 11/12/2020 CLINICAL DATA:  Near syncope. EXAM: PORTABLE CHEST 1  VIEW COMPARISON:  Chest radiograph dated 05/22/2020. FINDINGS: Right IJ catheter with tip over central SVC. No focal consolidation, pleural effusion, or pneumothorax. Mild cardiomegaly. No acute osseous pathology. IMPRESSION: 1. No acute cardiopulmonary process. 2. Mild cardiomegaly. Electronically Signed   By: Anner Crete M.D.   On: 11/12/2020 00:41    ____________________________________________   PROCEDURES  Procedure(s) performed (including Critical Care):  .1-3 Lead EKG Interpretation Performed by: Paulette Blanch, MD Authorized by: Paulette Blanch, MD     Interpretation: normal     ECG rate:  90   ECG rate assessment: normal     Rhythm: sinus rhythm     Ectopy: none     Conduction: normal   Comments:     Patient placed on cardiac monitor to evaluate for arrhythmias   ____________________________________________   INITIAL IMPRESSION / ASSESSMENT AND PLAN / ED COURSE  As part of my medical decision making, I reviewed the following data within the electronic MEDICAL RECORD NUMBER History obtained from family, Nursing notes reviewed and incorporated, Labs reviewed, EKG interpreted, Old chart reviewed, Radiograph reviewed, and Notes from prior ED visits     50 year old male presenting with near syncope.  Differential diagnosis includes but is not limited to volume depletion, ACS, metabolic derangement, infectious etiology, etc.  Will obtain repeat troponin, orthostatic vital signs, chest x-ray.  Clinical Course as of 11/12/20 0350  Mon Nov 12, 2020  0002 + orthostasis; will administer judicious IV fluids. [JS]  0126 Patient declines repeat troponin and IV fluids.  States he feels significantly better and is eager for discharge home.  He will hold further hemodialysis tonight.  His dialysis nurse is coming to see him in the morning.  Strict return precautions given.  Patient and spouse verbalized understanding and agree with plan of care. [JS]    Clinical Course User Index [JS]  Paulette Blanch, MD     ____________________________________________   FINAL CLINICAL IMPRESSION(S) / ED DIAGNOSES  Final diagnoses:  Near syncope  Dehydration  Orthostasis     ED Discharge Orders     None        Note:  This document was prepared using Dragon voice recognition software and may include unintentional dictation errors.    Paulette Blanch, MD 11/12/20 [phone]

## 2020-11-11 NOTE — ED Triage Notes (Signed)
Pt states was doing home hemodialysis tonight when he fainted and vomited. Pt complains of left sided chest pain. Per ems pt was slightly orthostatic. Pt denies headache.

## 2020-11-12 ENCOUNTER — Emergency Department: Payer: Federal, State, Local not specified - PPO

## 2020-11-12 DIAGNOSIS — Z992 Dependence on renal dialysis: Secondary | ICD-10-CM | POA: Diagnosis not present

## 2020-11-12 DIAGNOSIS — N2581 Secondary hyperparathyroidism of renal origin: Secondary | ICD-10-CM | POA: Diagnosis not present

## 2020-11-12 DIAGNOSIS — R55 Syncope and collapse: Secondary | ICD-10-CM | POA: Diagnosis not present

## 2020-11-12 DIAGNOSIS — N186 End stage renal disease: Secondary | ICD-10-CM | POA: Diagnosis not present

## 2020-11-12 DIAGNOSIS — I517 Cardiomegaly: Secondary | ICD-10-CM | POA: Diagnosis not present

## 2020-11-12 NOTE — Discharge Instructions (Signed)
Increase your fluid intake over the next several days.  Return to the ER for recurrent or worsening symptoms, persistent vomiting, difficulty breathing or other concerns.

## 2020-11-12 NOTE — ED Notes (Signed)
Pt states 'i feel better, I dont want the fluids, I want to go home.' Provider made aware

## 2020-11-13 DIAGNOSIS — N2581 Secondary hyperparathyroidism of renal origin: Secondary | ICD-10-CM | POA: Diagnosis not present

## 2020-11-13 DIAGNOSIS — Z992 Dependence on renal dialysis: Secondary | ICD-10-CM | POA: Diagnosis not present

## 2020-11-13 DIAGNOSIS — N186 End stage renal disease: Secondary | ICD-10-CM | POA: Diagnosis not present

## 2020-11-15 DIAGNOSIS — Z992 Dependence on renal dialysis: Secondary | ICD-10-CM | POA: Diagnosis not present

## 2020-11-15 DIAGNOSIS — N186 End stage renal disease: Secondary | ICD-10-CM | POA: Diagnosis not present

## 2020-11-15 DIAGNOSIS — N2581 Secondary hyperparathyroidism of renal origin: Secondary | ICD-10-CM | POA: Diagnosis not present

## 2020-11-16 DIAGNOSIS — N2581 Secondary hyperparathyroidism of renal origin: Secondary | ICD-10-CM | POA: Diagnosis not present

## 2020-11-16 DIAGNOSIS — Z992 Dependence on renal dialysis: Secondary | ICD-10-CM | POA: Diagnosis not present

## 2020-11-16 DIAGNOSIS — N186 End stage renal disease: Secondary | ICD-10-CM | POA: Diagnosis not present

## 2020-11-19 DIAGNOSIS — Z992 Dependence on renal dialysis: Secondary | ICD-10-CM | POA: Diagnosis not present

## 2020-11-19 DIAGNOSIS — N2581 Secondary hyperparathyroidism of renal origin: Secondary | ICD-10-CM | POA: Diagnosis not present

## 2020-11-19 DIAGNOSIS — N186 End stage renal disease: Secondary | ICD-10-CM | POA: Diagnosis not present

## 2020-11-20 DIAGNOSIS — N2581 Secondary hyperparathyroidism of renal origin: Secondary | ICD-10-CM | POA: Diagnosis not present

## 2020-11-20 DIAGNOSIS — N186 End stage renal disease: Secondary | ICD-10-CM | POA: Diagnosis not present

## 2020-11-20 DIAGNOSIS — Z992 Dependence on renal dialysis: Secondary | ICD-10-CM | POA: Diagnosis not present

## 2020-11-22 DIAGNOSIS — N186 End stage renal disease: Secondary | ICD-10-CM | POA: Diagnosis not present

## 2020-11-22 DIAGNOSIS — Z992 Dependence on renal dialysis: Secondary | ICD-10-CM | POA: Diagnosis not present

## 2020-11-22 DIAGNOSIS — N2581 Secondary hyperparathyroidism of renal origin: Secondary | ICD-10-CM | POA: Diagnosis not present

## 2020-11-23 DIAGNOSIS — N2581 Secondary hyperparathyroidism of renal origin: Secondary | ICD-10-CM | POA: Diagnosis not present

## 2020-11-23 DIAGNOSIS — Z992 Dependence on renal dialysis: Secondary | ICD-10-CM | POA: Diagnosis not present

## 2020-11-23 DIAGNOSIS — N186 End stage renal disease: Secondary | ICD-10-CM | POA: Diagnosis not present

## 2020-11-26 DIAGNOSIS — Z992 Dependence on renal dialysis: Secondary | ICD-10-CM | POA: Diagnosis not present

## 2020-11-26 DIAGNOSIS — N186 End stage renal disease: Secondary | ICD-10-CM | POA: Diagnosis not present

## 2020-11-26 DIAGNOSIS — N2581 Secondary hyperparathyroidism of renal origin: Secondary | ICD-10-CM | POA: Diagnosis not present

## 2020-11-27 DIAGNOSIS — Z992 Dependence on renal dialysis: Secondary | ICD-10-CM | POA: Diagnosis not present

## 2020-11-27 DIAGNOSIS — N186 End stage renal disease: Secondary | ICD-10-CM | POA: Diagnosis not present

## 2020-11-27 DIAGNOSIS — N2581 Secondary hyperparathyroidism of renal origin: Secondary | ICD-10-CM | POA: Diagnosis not present

## 2020-11-29 DIAGNOSIS — N186 End stage renal disease: Secondary | ICD-10-CM | POA: Diagnosis not present

## 2020-11-29 DIAGNOSIS — Z992 Dependence on renal dialysis: Secondary | ICD-10-CM | POA: Diagnosis not present

## 2020-11-29 DIAGNOSIS — N2581 Secondary hyperparathyroidism of renal origin: Secondary | ICD-10-CM | POA: Diagnosis not present

## 2020-11-30 DIAGNOSIS — N2581 Secondary hyperparathyroidism of renal origin: Secondary | ICD-10-CM | POA: Diagnosis not present

## 2020-11-30 DIAGNOSIS — N049 Nephrotic syndrome with unspecified morphologic changes: Secondary | ICD-10-CM | POA: Diagnosis not present

## 2020-11-30 DIAGNOSIS — Z992 Dependence on renal dialysis: Secondary | ICD-10-CM | POA: Diagnosis not present

## 2020-11-30 DIAGNOSIS — N186 End stage renal disease: Secondary | ICD-10-CM | POA: Diagnosis not present

## 2020-12-03 DIAGNOSIS — Z992 Dependence on renal dialysis: Secondary | ICD-10-CM | POA: Diagnosis not present

## 2020-12-03 DIAGNOSIS — N2581 Secondary hyperparathyroidism of renal origin: Secondary | ICD-10-CM | POA: Diagnosis not present

## 2020-12-03 DIAGNOSIS — N186 End stage renal disease: Secondary | ICD-10-CM | POA: Diagnosis not present

## 2020-12-04 DIAGNOSIS — N2581 Secondary hyperparathyroidism of renal origin: Secondary | ICD-10-CM | POA: Diagnosis not present

## 2020-12-04 DIAGNOSIS — Z992 Dependence on renal dialysis: Secondary | ICD-10-CM | POA: Diagnosis not present

## 2020-12-04 DIAGNOSIS — N186 End stage renal disease: Secondary | ICD-10-CM | POA: Diagnosis not present

## 2020-12-06 DIAGNOSIS — N2581 Secondary hyperparathyroidism of renal origin: Secondary | ICD-10-CM | POA: Diagnosis not present

## 2020-12-06 DIAGNOSIS — N186 End stage renal disease: Secondary | ICD-10-CM | POA: Diagnosis not present

## 2020-12-06 DIAGNOSIS — Z992 Dependence on renal dialysis: Secondary | ICD-10-CM | POA: Diagnosis not present

## 2020-12-07 DIAGNOSIS — N186 End stage renal disease: Secondary | ICD-10-CM | POA: Diagnosis not present

## 2020-12-07 DIAGNOSIS — N2581 Secondary hyperparathyroidism of renal origin: Secondary | ICD-10-CM | POA: Diagnosis not present

## 2020-12-07 DIAGNOSIS — Z992 Dependence on renal dialysis: Secondary | ICD-10-CM | POA: Diagnosis not present

## 2020-12-10 DIAGNOSIS — N2581 Secondary hyperparathyroidism of renal origin: Secondary | ICD-10-CM | POA: Diagnosis not present

## 2020-12-10 DIAGNOSIS — Z992 Dependence on renal dialysis: Secondary | ICD-10-CM | POA: Diagnosis not present

## 2020-12-10 DIAGNOSIS — N186 End stage renal disease: Secondary | ICD-10-CM | POA: Diagnosis not present

## 2020-12-11 DIAGNOSIS — Z992 Dependence on renal dialysis: Secondary | ICD-10-CM | POA: Diagnosis not present

## 2020-12-11 DIAGNOSIS — N186 End stage renal disease: Secondary | ICD-10-CM | POA: Diagnosis not present

## 2020-12-11 DIAGNOSIS — N2581 Secondary hyperparathyroidism of renal origin: Secondary | ICD-10-CM | POA: Diagnosis not present

## 2020-12-13 DIAGNOSIS — N2581 Secondary hyperparathyroidism of renal origin: Secondary | ICD-10-CM | POA: Diagnosis not present

## 2020-12-13 DIAGNOSIS — Z992 Dependence on renal dialysis: Secondary | ICD-10-CM | POA: Diagnosis not present

## 2020-12-13 DIAGNOSIS — N186 End stage renal disease: Secondary | ICD-10-CM | POA: Diagnosis not present

## 2020-12-14 DIAGNOSIS — N186 End stage renal disease: Secondary | ICD-10-CM | POA: Diagnosis not present

## 2020-12-14 DIAGNOSIS — Z992 Dependence on renal dialysis: Secondary | ICD-10-CM | POA: Diagnosis not present

## 2020-12-14 DIAGNOSIS — N2581 Secondary hyperparathyroidism of renal origin: Secondary | ICD-10-CM | POA: Diagnosis not present

## 2020-12-17 DIAGNOSIS — Z992 Dependence on renal dialysis: Secondary | ICD-10-CM | POA: Diagnosis not present

## 2020-12-17 DIAGNOSIS — N186 End stage renal disease: Secondary | ICD-10-CM | POA: Diagnosis not present

## 2020-12-17 DIAGNOSIS — N2581 Secondary hyperparathyroidism of renal origin: Secondary | ICD-10-CM | POA: Diagnosis not present

## 2020-12-18 DIAGNOSIS — N186 End stage renal disease: Secondary | ICD-10-CM | POA: Diagnosis not present

## 2020-12-18 DIAGNOSIS — N2581 Secondary hyperparathyroidism of renal origin: Secondary | ICD-10-CM | POA: Diagnosis not present

## 2020-12-18 DIAGNOSIS — Z992 Dependence on renal dialysis: Secondary | ICD-10-CM | POA: Diagnosis not present

## 2020-12-20 DIAGNOSIS — Z992 Dependence on renal dialysis: Secondary | ICD-10-CM | POA: Diagnosis not present

## 2020-12-20 DIAGNOSIS — N186 End stage renal disease: Secondary | ICD-10-CM | POA: Diagnosis not present

## 2020-12-20 DIAGNOSIS — N2581 Secondary hyperparathyroidism of renal origin: Secondary | ICD-10-CM | POA: Diagnosis not present

## 2020-12-21 DIAGNOSIS — N2581 Secondary hyperparathyroidism of renal origin: Secondary | ICD-10-CM | POA: Diagnosis not present

## 2020-12-21 DIAGNOSIS — Z992 Dependence on renal dialysis: Secondary | ICD-10-CM | POA: Diagnosis not present

## 2020-12-21 DIAGNOSIS — N186 End stage renal disease: Secondary | ICD-10-CM | POA: Diagnosis not present

## 2020-12-24 DIAGNOSIS — Z992 Dependence on renal dialysis: Secondary | ICD-10-CM | POA: Diagnosis not present

## 2020-12-24 DIAGNOSIS — N2581 Secondary hyperparathyroidism of renal origin: Secondary | ICD-10-CM | POA: Diagnosis not present

## 2020-12-24 DIAGNOSIS — N186 End stage renal disease: Secondary | ICD-10-CM | POA: Diagnosis not present

## 2020-12-25 DIAGNOSIS — N186 End stage renal disease: Secondary | ICD-10-CM | POA: Diagnosis not present

## 2020-12-25 DIAGNOSIS — N2581 Secondary hyperparathyroidism of renal origin: Secondary | ICD-10-CM | POA: Diagnosis not present

## 2020-12-25 DIAGNOSIS — Z992 Dependence on renal dialysis: Secondary | ICD-10-CM | POA: Diagnosis not present

## 2020-12-26 DIAGNOSIS — Z7682 Awaiting organ transplant status: Secondary | ICD-10-CM | POA: Diagnosis not present

## 2020-12-26 DIAGNOSIS — R935 Abnormal findings on diagnostic imaging of other abdominal regions, including retroperitoneum: Secondary | ICD-10-CM | POA: Diagnosis not present

## 2020-12-26 DIAGNOSIS — Z01810 Encounter for preprocedural cardiovascular examination: Secondary | ICD-10-CM | POA: Diagnosis not present

## 2020-12-26 DIAGNOSIS — R000 Tachycardia, unspecified: Secondary | ICD-10-CM | POA: Diagnosis not present

## 2020-12-26 DIAGNOSIS — I517 Cardiomegaly: Secondary | ICD-10-CM | POA: Diagnosis not present

## 2020-12-26 DIAGNOSIS — Z0183 Encounter for blood typing: Secondary | ICD-10-CM | POA: Diagnosis not present

## 2020-12-26 DIAGNOSIS — Z01818 Encounter for other preprocedural examination: Secondary | ICD-10-CM | POA: Diagnosis not present

## 2020-12-26 DIAGNOSIS — N2889 Other specified disorders of kidney and ureter: Secondary | ICD-10-CM | POA: Diagnosis not present

## 2020-12-26 DIAGNOSIS — Z940 Kidney transplant status: Secondary | ICD-10-CM | POA: Diagnosis not present

## 2020-12-26 DIAGNOSIS — Z86718 Personal history of other venous thrombosis and embolism: Secondary | ICD-10-CM | POA: Diagnosis not present

## 2020-12-27 DIAGNOSIS — N2581 Secondary hyperparathyroidism of renal origin: Secondary | ICD-10-CM | POA: Diagnosis not present

## 2020-12-27 DIAGNOSIS — N186 End stage renal disease: Secondary | ICD-10-CM | POA: Diagnosis not present

## 2020-12-27 DIAGNOSIS — Z992 Dependence on renal dialysis: Secondary | ICD-10-CM | POA: Diagnosis not present

## 2020-12-28 DIAGNOSIS — N186 End stage renal disease: Secondary | ICD-10-CM | POA: Diagnosis not present

## 2020-12-28 DIAGNOSIS — N2581 Secondary hyperparathyroidism of renal origin: Secondary | ICD-10-CM | POA: Diagnosis not present

## 2020-12-28 DIAGNOSIS — Z992 Dependence on renal dialysis: Secondary | ICD-10-CM | POA: Diagnosis not present

## 2020-12-31 DIAGNOSIS — N049 Nephrotic syndrome with unspecified morphologic changes: Secondary | ICD-10-CM | POA: Diagnosis not present

## 2020-12-31 DIAGNOSIS — Z992 Dependence on renal dialysis: Secondary | ICD-10-CM | POA: Diagnosis not present

## 2020-12-31 DIAGNOSIS — N186 End stage renal disease: Secondary | ICD-10-CM | POA: Diagnosis not present

## 2020-12-31 DIAGNOSIS — N2581 Secondary hyperparathyroidism of renal origin: Secondary | ICD-10-CM | POA: Diagnosis not present

## 2021-01-01 DIAGNOSIS — N2581 Secondary hyperparathyroidism of renal origin: Secondary | ICD-10-CM | POA: Diagnosis not present

## 2021-01-01 DIAGNOSIS — Z992 Dependence on renal dialysis: Secondary | ICD-10-CM | POA: Diagnosis not present

## 2021-01-01 DIAGNOSIS — N186 End stage renal disease: Secondary | ICD-10-CM | POA: Diagnosis not present

## 2021-01-03 DIAGNOSIS — N2581 Secondary hyperparathyroidism of renal origin: Secondary | ICD-10-CM | POA: Diagnosis not present

## 2021-01-03 DIAGNOSIS — N186 End stage renal disease: Secondary | ICD-10-CM | POA: Diagnosis not present

## 2021-01-03 DIAGNOSIS — Z992 Dependence on renal dialysis: Secondary | ICD-10-CM | POA: Diagnosis not present

## 2021-01-04 ENCOUNTER — Other Ambulatory Visit: Payer: Self-pay | Admitting: Registered Nurse

## 2021-01-04 DIAGNOSIS — Z992 Dependence on renal dialysis: Secondary | ICD-10-CM | POA: Diagnosis not present

## 2021-01-04 DIAGNOSIS — N186 End stage renal disease: Secondary | ICD-10-CM | POA: Diagnosis not present

## 2021-01-04 DIAGNOSIS — N2581 Secondary hyperparathyroidism of renal origin: Secondary | ICD-10-CM | POA: Diagnosis not present

## 2021-01-04 DIAGNOSIS — I10 Essential (primary) hypertension: Secondary | ICD-10-CM

## 2021-01-07 DIAGNOSIS — N2581 Secondary hyperparathyroidism of renal origin: Secondary | ICD-10-CM | POA: Diagnosis not present

## 2021-01-07 DIAGNOSIS — Z992 Dependence on renal dialysis: Secondary | ICD-10-CM | POA: Diagnosis not present

## 2021-01-07 DIAGNOSIS — N186 End stage renal disease: Secondary | ICD-10-CM | POA: Diagnosis not present

## 2021-01-08 DIAGNOSIS — N2581 Secondary hyperparathyroidism of renal origin: Secondary | ICD-10-CM | POA: Diagnosis not present

## 2021-01-08 DIAGNOSIS — Z992 Dependence on renal dialysis: Secondary | ICD-10-CM | POA: Diagnosis not present

## 2021-01-08 DIAGNOSIS — N186 End stage renal disease: Secondary | ICD-10-CM | POA: Diagnosis not present

## 2021-01-10 DIAGNOSIS — N2581 Secondary hyperparathyroidism of renal origin: Secondary | ICD-10-CM | POA: Diagnosis not present

## 2021-01-10 DIAGNOSIS — N186 End stage renal disease: Secondary | ICD-10-CM | POA: Diagnosis not present

## 2021-01-10 DIAGNOSIS — Z992 Dependence on renal dialysis: Secondary | ICD-10-CM | POA: Diagnosis not present

## 2021-01-11 DIAGNOSIS — N186 End stage renal disease: Secondary | ICD-10-CM | POA: Diagnosis not present

## 2021-01-11 DIAGNOSIS — Z992 Dependence on renal dialysis: Secondary | ICD-10-CM | POA: Diagnosis not present

## 2021-01-11 DIAGNOSIS — N2581 Secondary hyperparathyroidism of renal origin: Secondary | ICD-10-CM | POA: Diagnosis not present

## 2021-01-14 DIAGNOSIS — N186 End stage renal disease: Secondary | ICD-10-CM | POA: Diagnosis not present

## 2021-01-14 DIAGNOSIS — N2581 Secondary hyperparathyroidism of renal origin: Secondary | ICD-10-CM | POA: Diagnosis not present

## 2021-01-14 DIAGNOSIS — T82858A Stenosis of vascular prosthetic devices, implants and grafts, initial encounter: Secondary | ICD-10-CM | POA: Diagnosis not present

## 2021-01-14 DIAGNOSIS — Z992 Dependence on renal dialysis: Secondary | ICD-10-CM | POA: Diagnosis not present

## 2021-01-14 DIAGNOSIS — I871 Compression of vein: Secondary | ICD-10-CM | POA: Diagnosis not present

## 2021-01-15 DIAGNOSIS — Z992 Dependence on renal dialysis: Secondary | ICD-10-CM | POA: Diagnosis not present

## 2021-01-15 DIAGNOSIS — N2581 Secondary hyperparathyroidism of renal origin: Secondary | ICD-10-CM | POA: Diagnosis not present

## 2021-01-15 DIAGNOSIS — N186 End stage renal disease: Secondary | ICD-10-CM | POA: Diagnosis not present

## 2021-01-17 DIAGNOSIS — Z992 Dependence on renal dialysis: Secondary | ICD-10-CM | POA: Diagnosis not present

## 2021-01-17 DIAGNOSIS — N2581 Secondary hyperparathyroidism of renal origin: Secondary | ICD-10-CM | POA: Diagnosis not present

## 2021-01-17 DIAGNOSIS — N186 End stage renal disease: Secondary | ICD-10-CM | POA: Diagnosis not present

## 2021-01-18 DIAGNOSIS — N2581 Secondary hyperparathyroidism of renal origin: Secondary | ICD-10-CM | POA: Diagnosis not present

## 2021-01-18 DIAGNOSIS — Z992 Dependence on renal dialysis: Secondary | ICD-10-CM | POA: Diagnosis not present

## 2021-01-18 DIAGNOSIS — N186 End stage renal disease: Secondary | ICD-10-CM | POA: Diagnosis not present

## 2021-01-21 DIAGNOSIS — Z992 Dependence on renal dialysis: Secondary | ICD-10-CM | POA: Diagnosis not present

## 2021-01-21 DIAGNOSIS — N186 End stage renal disease: Secondary | ICD-10-CM | POA: Diagnosis not present

## 2021-01-21 DIAGNOSIS — N2581 Secondary hyperparathyroidism of renal origin: Secondary | ICD-10-CM | POA: Diagnosis not present

## 2021-01-22 DIAGNOSIS — N186 End stage renal disease: Secondary | ICD-10-CM | POA: Diagnosis not present

## 2021-01-22 DIAGNOSIS — N2581 Secondary hyperparathyroidism of renal origin: Secondary | ICD-10-CM | POA: Diagnosis not present

## 2021-01-22 DIAGNOSIS — Z992 Dependence on renal dialysis: Secondary | ICD-10-CM | POA: Diagnosis not present

## 2021-01-23 DIAGNOSIS — N186 End stage renal disease: Secondary | ICD-10-CM | POA: Diagnosis not present

## 2021-01-23 DIAGNOSIS — N2581 Secondary hyperparathyroidism of renal origin: Secondary | ICD-10-CM | POA: Diagnosis not present

## 2021-01-23 DIAGNOSIS — Z992 Dependence on renal dialysis: Secondary | ICD-10-CM | POA: Diagnosis not present

## 2021-01-25 DIAGNOSIS — N2581 Secondary hyperparathyroidism of renal origin: Secondary | ICD-10-CM | POA: Diagnosis not present

## 2021-01-25 DIAGNOSIS — Z992 Dependence on renal dialysis: Secondary | ICD-10-CM | POA: Diagnosis not present

## 2021-01-25 DIAGNOSIS — N186 End stage renal disease: Secondary | ICD-10-CM | POA: Diagnosis not present

## 2021-01-28 DIAGNOSIS — N186 End stage renal disease: Secondary | ICD-10-CM | POA: Diagnosis not present

## 2021-01-28 DIAGNOSIS — N2581 Secondary hyperparathyroidism of renal origin: Secondary | ICD-10-CM | POA: Diagnosis not present

## 2021-01-28 DIAGNOSIS — Z992 Dependence on renal dialysis: Secondary | ICD-10-CM | POA: Diagnosis not present

## 2021-01-29 DIAGNOSIS — Z992 Dependence on renal dialysis: Secondary | ICD-10-CM | POA: Diagnosis not present

## 2021-01-29 DIAGNOSIS — N186 End stage renal disease: Secondary | ICD-10-CM | POA: Diagnosis not present

## 2021-01-29 DIAGNOSIS — N2581 Secondary hyperparathyroidism of renal origin: Secondary | ICD-10-CM | POA: Diagnosis not present

## 2021-01-30 DIAGNOSIS — N049 Nephrotic syndrome with unspecified morphologic changes: Secondary | ICD-10-CM | POA: Diagnosis not present

## 2021-01-30 DIAGNOSIS — N186 End stage renal disease: Secondary | ICD-10-CM | POA: Diagnosis not present

## 2021-01-30 DIAGNOSIS — Z992 Dependence on renal dialysis: Secondary | ICD-10-CM | POA: Diagnosis not present

## 2021-01-31 DIAGNOSIS — Z992 Dependence on renal dialysis: Secondary | ICD-10-CM | POA: Diagnosis not present

## 2021-01-31 DIAGNOSIS — N2581 Secondary hyperparathyroidism of renal origin: Secondary | ICD-10-CM | POA: Diagnosis not present

## 2021-01-31 DIAGNOSIS — N186 End stage renal disease: Secondary | ICD-10-CM | POA: Diagnosis not present

## 2021-02-01 DIAGNOSIS — Z992 Dependence on renal dialysis: Secondary | ICD-10-CM | POA: Diagnosis not present

## 2021-02-01 DIAGNOSIS — N186 End stage renal disease: Secondary | ICD-10-CM | POA: Diagnosis not present

## 2021-02-01 DIAGNOSIS — N2581 Secondary hyperparathyroidism of renal origin: Secondary | ICD-10-CM | POA: Diagnosis not present

## 2021-02-04 DIAGNOSIS — N186 End stage renal disease: Secondary | ICD-10-CM | POA: Diagnosis not present

## 2021-02-04 DIAGNOSIS — N2581 Secondary hyperparathyroidism of renal origin: Secondary | ICD-10-CM | POA: Diagnosis not present

## 2021-02-04 DIAGNOSIS — Z992 Dependence on renal dialysis: Secondary | ICD-10-CM | POA: Diagnosis not present

## 2021-02-05 DIAGNOSIS — N2581 Secondary hyperparathyroidism of renal origin: Secondary | ICD-10-CM | POA: Diagnosis not present

## 2021-02-05 DIAGNOSIS — Z992 Dependence on renal dialysis: Secondary | ICD-10-CM | POA: Diagnosis not present

## 2021-02-05 DIAGNOSIS — N186 End stage renal disease: Secondary | ICD-10-CM | POA: Diagnosis not present

## 2021-02-07 DIAGNOSIS — N186 End stage renal disease: Secondary | ICD-10-CM | POA: Diagnosis not present

## 2021-02-07 DIAGNOSIS — N2581 Secondary hyperparathyroidism of renal origin: Secondary | ICD-10-CM | POA: Diagnosis not present

## 2021-02-07 DIAGNOSIS — Z992 Dependence on renal dialysis: Secondary | ICD-10-CM | POA: Diagnosis not present

## 2021-02-08 DIAGNOSIS — N186 End stage renal disease: Secondary | ICD-10-CM | POA: Diagnosis not present

## 2021-02-08 DIAGNOSIS — Z992 Dependence on renal dialysis: Secondary | ICD-10-CM | POA: Diagnosis not present

## 2021-02-08 DIAGNOSIS — T82858A Stenosis of vascular prosthetic devices, implants and grafts, initial encounter: Secondary | ICD-10-CM | POA: Diagnosis not present

## 2021-02-08 DIAGNOSIS — I871 Compression of vein: Secondary | ICD-10-CM | POA: Diagnosis not present

## 2021-02-12 DIAGNOSIS — N2581 Secondary hyperparathyroidism of renal origin: Secondary | ICD-10-CM | POA: Diagnosis not present

## 2021-02-12 DIAGNOSIS — N186 End stage renal disease: Secondary | ICD-10-CM | POA: Diagnosis not present

## 2021-02-12 DIAGNOSIS — Z992 Dependence on renal dialysis: Secondary | ICD-10-CM | POA: Diagnosis not present

## 2021-02-13 DIAGNOSIS — Z992 Dependence on renal dialysis: Secondary | ICD-10-CM | POA: Diagnosis not present

## 2021-02-13 DIAGNOSIS — N2581 Secondary hyperparathyroidism of renal origin: Secondary | ICD-10-CM | POA: Diagnosis not present

## 2021-02-13 DIAGNOSIS — N186 End stage renal disease: Secondary | ICD-10-CM | POA: Diagnosis not present

## 2021-02-14 DIAGNOSIS — N2581 Secondary hyperparathyroidism of renal origin: Secondary | ICD-10-CM | POA: Diagnosis not present

## 2021-02-14 DIAGNOSIS — Z992 Dependence on renal dialysis: Secondary | ICD-10-CM | POA: Diagnosis not present

## 2021-02-14 DIAGNOSIS — N186 End stage renal disease: Secondary | ICD-10-CM | POA: Diagnosis not present

## 2021-02-15 DIAGNOSIS — N186 End stage renal disease: Secondary | ICD-10-CM | POA: Diagnosis not present

## 2021-02-15 DIAGNOSIS — N2581 Secondary hyperparathyroidism of renal origin: Secondary | ICD-10-CM | POA: Diagnosis not present

## 2021-02-15 DIAGNOSIS — Z992 Dependence on renal dialysis: Secondary | ICD-10-CM | POA: Diagnosis not present

## 2021-02-19 DIAGNOSIS — Z992 Dependence on renal dialysis: Secondary | ICD-10-CM | POA: Diagnosis not present

## 2021-02-19 DIAGNOSIS — N2581 Secondary hyperparathyroidism of renal origin: Secondary | ICD-10-CM | POA: Diagnosis not present

## 2021-02-19 DIAGNOSIS — Z23 Encounter for immunization: Secondary | ICD-10-CM | POA: Diagnosis not present

## 2021-02-19 DIAGNOSIS — N186 End stage renal disease: Secondary | ICD-10-CM | POA: Diagnosis not present

## 2021-02-21 DIAGNOSIS — N2581 Secondary hyperparathyroidism of renal origin: Secondary | ICD-10-CM | POA: Diagnosis not present

## 2021-02-21 DIAGNOSIS — Z23 Encounter for immunization: Secondary | ICD-10-CM | POA: Diagnosis not present

## 2021-02-21 DIAGNOSIS — Z992 Dependence on renal dialysis: Secondary | ICD-10-CM | POA: Diagnosis not present

## 2021-02-21 DIAGNOSIS — N186 End stage renal disease: Secondary | ICD-10-CM | POA: Diagnosis not present

## 2021-02-22 DIAGNOSIS — Z23 Encounter for immunization: Secondary | ICD-10-CM | POA: Diagnosis not present

## 2021-02-22 DIAGNOSIS — N2581 Secondary hyperparathyroidism of renal origin: Secondary | ICD-10-CM | POA: Diagnosis not present

## 2021-02-22 DIAGNOSIS — Z992 Dependence on renal dialysis: Secondary | ICD-10-CM | POA: Diagnosis not present

## 2021-02-22 DIAGNOSIS — N186 End stage renal disease: Secondary | ICD-10-CM | POA: Diagnosis not present

## 2021-02-25 DIAGNOSIS — N186 End stage renal disease: Secondary | ICD-10-CM | POA: Diagnosis not present

## 2021-02-25 DIAGNOSIS — Z992 Dependence on renal dialysis: Secondary | ICD-10-CM | POA: Diagnosis not present

## 2021-02-25 DIAGNOSIS — N2581 Secondary hyperparathyroidism of renal origin: Secondary | ICD-10-CM | POA: Diagnosis not present

## 2021-02-26 DIAGNOSIS — N2581 Secondary hyperparathyroidism of renal origin: Secondary | ICD-10-CM | POA: Diagnosis not present

## 2021-02-26 DIAGNOSIS — Z992 Dependence on renal dialysis: Secondary | ICD-10-CM | POA: Diagnosis not present

## 2021-02-26 DIAGNOSIS — N186 End stage renal disease: Secondary | ICD-10-CM | POA: Diagnosis not present

## 2021-02-28 DIAGNOSIS — N2581 Secondary hyperparathyroidism of renal origin: Secondary | ICD-10-CM | POA: Diagnosis not present

## 2021-02-28 DIAGNOSIS — N186 End stage renal disease: Secondary | ICD-10-CM | POA: Diagnosis not present

## 2021-02-28 DIAGNOSIS — Z992 Dependence on renal dialysis: Secondary | ICD-10-CM | POA: Diagnosis not present

## 2021-03-01 DIAGNOSIS — Z992 Dependence on renal dialysis: Secondary | ICD-10-CM | POA: Diagnosis not present

## 2021-03-01 DIAGNOSIS — N186 End stage renal disease: Secondary | ICD-10-CM | POA: Diagnosis not present

## 2021-03-01 DIAGNOSIS — N2581 Secondary hyperparathyroidism of renal origin: Secondary | ICD-10-CM | POA: Diagnosis not present

## 2021-03-02 DIAGNOSIS — N049 Nephrotic syndrome with unspecified morphologic changes: Secondary | ICD-10-CM | POA: Diagnosis not present

## 2021-03-02 DIAGNOSIS — Z992 Dependence on renal dialysis: Secondary | ICD-10-CM | POA: Diagnosis not present

## 2021-03-02 DIAGNOSIS — N186 End stage renal disease: Secondary | ICD-10-CM | POA: Diagnosis not present

## 2021-03-04 DIAGNOSIS — N2581 Secondary hyperparathyroidism of renal origin: Secondary | ICD-10-CM | POA: Diagnosis not present

## 2021-03-04 DIAGNOSIS — N186 End stage renal disease: Secondary | ICD-10-CM | POA: Diagnosis not present

## 2021-03-04 DIAGNOSIS — Z992 Dependence on renal dialysis: Secondary | ICD-10-CM | POA: Diagnosis not present

## 2021-03-05 DIAGNOSIS — N186 End stage renal disease: Secondary | ICD-10-CM | POA: Diagnosis not present

## 2021-03-05 DIAGNOSIS — Z992 Dependence on renal dialysis: Secondary | ICD-10-CM | POA: Diagnosis not present

## 2021-03-05 DIAGNOSIS — N2581 Secondary hyperparathyroidism of renal origin: Secondary | ICD-10-CM | POA: Diagnosis not present

## 2021-03-07 DIAGNOSIS — Z992 Dependence on renal dialysis: Secondary | ICD-10-CM | POA: Diagnosis not present

## 2021-03-07 DIAGNOSIS — N186 End stage renal disease: Secondary | ICD-10-CM | POA: Diagnosis not present

## 2021-03-07 DIAGNOSIS — N2581 Secondary hyperparathyroidism of renal origin: Secondary | ICD-10-CM | POA: Diagnosis not present

## 2021-03-08 DIAGNOSIS — N2581 Secondary hyperparathyroidism of renal origin: Secondary | ICD-10-CM | POA: Diagnosis not present

## 2021-03-08 DIAGNOSIS — Z992 Dependence on renal dialysis: Secondary | ICD-10-CM | POA: Diagnosis not present

## 2021-03-08 DIAGNOSIS — N186 End stage renal disease: Secondary | ICD-10-CM | POA: Diagnosis not present

## 2021-03-11 DIAGNOSIS — N2581 Secondary hyperparathyroidism of renal origin: Secondary | ICD-10-CM | POA: Diagnosis not present

## 2021-03-11 DIAGNOSIS — Z992 Dependence on renal dialysis: Secondary | ICD-10-CM | POA: Diagnosis not present

## 2021-03-11 DIAGNOSIS — N186 End stage renal disease: Secondary | ICD-10-CM | POA: Diagnosis not present

## 2021-03-12 DIAGNOSIS — N2581 Secondary hyperparathyroidism of renal origin: Secondary | ICD-10-CM | POA: Diagnosis not present

## 2021-03-12 DIAGNOSIS — Z992 Dependence on renal dialysis: Secondary | ICD-10-CM | POA: Diagnosis not present

## 2021-03-12 DIAGNOSIS — N186 End stage renal disease: Secondary | ICD-10-CM | POA: Diagnosis not present

## 2021-03-14 DIAGNOSIS — Z992 Dependence on renal dialysis: Secondary | ICD-10-CM | POA: Diagnosis not present

## 2021-03-14 DIAGNOSIS — N186 End stage renal disease: Secondary | ICD-10-CM | POA: Diagnosis not present

## 2021-03-14 DIAGNOSIS — N2581 Secondary hyperparathyroidism of renal origin: Secondary | ICD-10-CM | POA: Diagnosis not present

## 2021-03-15 DIAGNOSIS — N186 End stage renal disease: Secondary | ICD-10-CM | POA: Diagnosis not present

## 2021-03-15 DIAGNOSIS — Z992 Dependence on renal dialysis: Secondary | ICD-10-CM | POA: Diagnosis not present

## 2021-03-15 DIAGNOSIS — N2581 Secondary hyperparathyroidism of renal origin: Secondary | ICD-10-CM | POA: Diagnosis not present

## 2021-03-18 DIAGNOSIS — N2581 Secondary hyperparathyroidism of renal origin: Secondary | ICD-10-CM | POA: Diagnosis not present

## 2021-03-18 DIAGNOSIS — N186 End stage renal disease: Secondary | ICD-10-CM | POA: Diagnosis not present

## 2021-03-18 DIAGNOSIS — Z992 Dependence on renal dialysis: Secondary | ICD-10-CM | POA: Diagnosis not present

## 2021-03-19 DIAGNOSIS — Z992 Dependence on renal dialysis: Secondary | ICD-10-CM | POA: Diagnosis not present

## 2021-03-19 DIAGNOSIS — N2581 Secondary hyperparathyroidism of renal origin: Secondary | ICD-10-CM | POA: Diagnosis not present

## 2021-03-19 DIAGNOSIS — N186 End stage renal disease: Secondary | ICD-10-CM | POA: Diagnosis not present

## 2021-03-21 DIAGNOSIS — Z992 Dependence on renal dialysis: Secondary | ICD-10-CM | POA: Diagnosis not present

## 2021-03-21 DIAGNOSIS — N2581 Secondary hyperparathyroidism of renal origin: Secondary | ICD-10-CM | POA: Diagnosis not present

## 2021-03-21 DIAGNOSIS — N186 End stage renal disease: Secondary | ICD-10-CM | POA: Diagnosis not present

## 2021-03-22 DIAGNOSIS — N2581 Secondary hyperparathyroidism of renal origin: Secondary | ICD-10-CM | POA: Diagnosis not present

## 2021-03-22 DIAGNOSIS — Z992 Dependence on renal dialysis: Secondary | ICD-10-CM | POA: Diagnosis not present

## 2021-03-22 DIAGNOSIS — N186 End stage renal disease: Secondary | ICD-10-CM | POA: Diagnosis not present

## 2021-03-25 DIAGNOSIS — N186 End stage renal disease: Secondary | ICD-10-CM | POA: Diagnosis not present

## 2021-03-25 DIAGNOSIS — N2581 Secondary hyperparathyroidism of renal origin: Secondary | ICD-10-CM | POA: Diagnosis not present

## 2021-03-25 DIAGNOSIS — Z992 Dependence on renal dialysis: Secondary | ICD-10-CM | POA: Diagnosis not present

## 2021-03-26 DIAGNOSIS — N2581 Secondary hyperparathyroidism of renal origin: Secondary | ICD-10-CM | POA: Diagnosis not present

## 2021-03-26 DIAGNOSIS — Z992 Dependence on renal dialysis: Secondary | ICD-10-CM | POA: Diagnosis not present

## 2021-03-26 DIAGNOSIS — N186 End stage renal disease: Secondary | ICD-10-CM | POA: Diagnosis not present

## 2021-03-28 DIAGNOSIS — N2581 Secondary hyperparathyroidism of renal origin: Secondary | ICD-10-CM | POA: Diagnosis not present

## 2021-03-28 DIAGNOSIS — Z992 Dependence on renal dialysis: Secondary | ICD-10-CM | POA: Diagnosis not present

## 2021-03-28 DIAGNOSIS — N186 End stage renal disease: Secondary | ICD-10-CM | POA: Diagnosis not present

## 2021-03-29 DIAGNOSIS — N2581 Secondary hyperparathyroidism of renal origin: Secondary | ICD-10-CM | POA: Diagnosis not present

## 2021-03-29 DIAGNOSIS — N186 End stage renal disease: Secondary | ICD-10-CM | POA: Diagnosis not present

## 2021-03-29 DIAGNOSIS — Z992 Dependence on renal dialysis: Secondary | ICD-10-CM | POA: Diagnosis not present

## 2021-04-01 DIAGNOSIS — Z992 Dependence on renal dialysis: Secondary | ICD-10-CM | POA: Diagnosis not present

## 2021-04-01 DIAGNOSIS — N186 End stage renal disease: Secondary | ICD-10-CM | POA: Diagnosis not present

## 2021-04-01 DIAGNOSIS — N2581 Secondary hyperparathyroidism of renal origin: Secondary | ICD-10-CM | POA: Diagnosis not present

## 2021-04-02 DIAGNOSIS — Z992 Dependence on renal dialysis: Secondary | ICD-10-CM | POA: Diagnosis not present

## 2021-04-02 DIAGNOSIS — N2581 Secondary hyperparathyroidism of renal origin: Secondary | ICD-10-CM | POA: Diagnosis not present

## 2021-04-02 DIAGNOSIS — N186 End stage renal disease: Secondary | ICD-10-CM | POA: Diagnosis not present

## 2021-04-02 DIAGNOSIS — N049 Nephrotic syndrome with unspecified morphologic changes: Secondary | ICD-10-CM | POA: Diagnosis not present

## 2021-04-04 DIAGNOSIS — N186 End stage renal disease: Secondary | ICD-10-CM | POA: Diagnosis not present

## 2021-04-04 DIAGNOSIS — N2581 Secondary hyperparathyroidism of renal origin: Secondary | ICD-10-CM | POA: Diagnosis not present

## 2021-04-04 DIAGNOSIS — Z992 Dependence on renal dialysis: Secondary | ICD-10-CM | POA: Diagnosis not present

## 2021-04-05 DIAGNOSIS — N2581 Secondary hyperparathyroidism of renal origin: Secondary | ICD-10-CM | POA: Diagnosis not present

## 2021-04-05 DIAGNOSIS — N186 End stage renal disease: Secondary | ICD-10-CM | POA: Diagnosis not present

## 2021-04-05 DIAGNOSIS — Z992 Dependence on renal dialysis: Secondary | ICD-10-CM | POA: Diagnosis not present

## 2021-04-08 DIAGNOSIS — N2581 Secondary hyperparathyroidism of renal origin: Secondary | ICD-10-CM | POA: Diagnosis not present

## 2021-04-08 DIAGNOSIS — Z992 Dependence on renal dialysis: Secondary | ICD-10-CM | POA: Diagnosis not present

## 2021-04-08 DIAGNOSIS — N186 End stage renal disease: Secondary | ICD-10-CM | POA: Diagnosis not present

## 2021-04-09 DIAGNOSIS — Z992 Dependence on renal dialysis: Secondary | ICD-10-CM | POA: Diagnosis not present

## 2021-04-09 DIAGNOSIS — N186 End stage renal disease: Secondary | ICD-10-CM | POA: Diagnosis not present

## 2021-04-09 DIAGNOSIS — N2581 Secondary hyperparathyroidism of renal origin: Secondary | ICD-10-CM | POA: Diagnosis not present

## 2021-04-10 DIAGNOSIS — N186 End stage renal disease: Secondary | ICD-10-CM | POA: Diagnosis not present

## 2021-04-10 DIAGNOSIS — Z452 Encounter for adjustment and management of vascular access device: Secondary | ICD-10-CM | POA: Diagnosis not present

## 2021-04-10 DIAGNOSIS — Z992 Dependence on renal dialysis: Secondary | ICD-10-CM | POA: Diagnosis not present

## 2021-04-11 DIAGNOSIS — N2581 Secondary hyperparathyroidism of renal origin: Secondary | ICD-10-CM | POA: Diagnosis not present

## 2021-04-11 DIAGNOSIS — N186 End stage renal disease: Secondary | ICD-10-CM | POA: Diagnosis not present

## 2021-04-11 DIAGNOSIS — Z992 Dependence on renal dialysis: Secondary | ICD-10-CM | POA: Diagnosis not present

## 2021-04-12 DIAGNOSIS — Z992 Dependence on renal dialysis: Secondary | ICD-10-CM | POA: Diagnosis not present

## 2021-04-12 DIAGNOSIS — N186 End stage renal disease: Secondary | ICD-10-CM | POA: Diagnosis not present

## 2021-04-12 DIAGNOSIS — N2581 Secondary hyperparathyroidism of renal origin: Secondary | ICD-10-CM | POA: Diagnosis not present

## 2021-04-15 DIAGNOSIS — Z992 Dependence on renal dialysis: Secondary | ICD-10-CM | POA: Diagnosis not present

## 2021-04-15 DIAGNOSIS — N2581 Secondary hyperparathyroidism of renal origin: Secondary | ICD-10-CM | POA: Diagnosis not present

## 2021-04-15 DIAGNOSIS — N186 End stage renal disease: Secondary | ICD-10-CM | POA: Diagnosis not present

## 2021-04-16 ENCOUNTER — Encounter: Payer: Self-pay | Admitting: Cardiovascular Disease

## 2021-04-16 DIAGNOSIS — Z992 Dependence on renal dialysis: Secondary | ICD-10-CM | POA: Diagnosis not present

## 2021-04-16 DIAGNOSIS — N186 End stage renal disease: Secondary | ICD-10-CM | POA: Diagnosis not present

## 2021-04-16 DIAGNOSIS — N2581 Secondary hyperparathyroidism of renal origin: Secondary | ICD-10-CM | POA: Diagnosis not present

## 2021-04-18 DIAGNOSIS — N186 End stage renal disease: Secondary | ICD-10-CM | POA: Diagnosis not present

## 2021-04-18 DIAGNOSIS — N2581 Secondary hyperparathyroidism of renal origin: Secondary | ICD-10-CM | POA: Diagnosis not present

## 2021-04-18 DIAGNOSIS — Z992 Dependence on renal dialysis: Secondary | ICD-10-CM | POA: Diagnosis not present

## 2021-04-19 DIAGNOSIS — Z992 Dependence on renal dialysis: Secondary | ICD-10-CM | POA: Diagnosis not present

## 2021-04-19 DIAGNOSIS — N2581 Secondary hyperparathyroidism of renal origin: Secondary | ICD-10-CM | POA: Diagnosis not present

## 2021-04-19 DIAGNOSIS — N186 End stage renal disease: Secondary | ICD-10-CM | POA: Diagnosis not present

## 2021-04-22 DIAGNOSIS — N2581 Secondary hyperparathyroidism of renal origin: Secondary | ICD-10-CM | POA: Diagnosis not present

## 2021-04-22 DIAGNOSIS — N186 End stage renal disease: Secondary | ICD-10-CM | POA: Diagnosis not present

## 2021-04-22 DIAGNOSIS — Z992 Dependence on renal dialysis: Secondary | ICD-10-CM | POA: Diagnosis not present

## 2021-04-24 DIAGNOSIS — R935 Abnormal findings on diagnostic imaging of other abdominal regions, including retroperitoneum: Secondary | ICD-10-CM | POA: Diagnosis not present

## 2021-04-24 DIAGNOSIS — N186 End stage renal disease: Secondary | ICD-10-CM | POA: Diagnosis not present

## 2021-04-24 DIAGNOSIS — Z992 Dependence on renal dialysis: Secondary | ICD-10-CM | POA: Diagnosis not present

## 2021-04-24 DIAGNOSIS — N2581 Secondary hyperparathyroidism of renal origin: Secondary | ICD-10-CM | POA: Diagnosis not present

## 2021-04-24 DIAGNOSIS — Z7682 Awaiting organ transplant status: Secondary | ICD-10-CM | POA: Diagnosis not present

## 2021-04-26 DIAGNOSIS — N186 End stage renal disease: Secondary | ICD-10-CM | POA: Diagnosis not present

## 2021-04-26 DIAGNOSIS — Z992 Dependence on renal dialysis: Secondary | ICD-10-CM | POA: Diagnosis not present

## 2021-04-26 DIAGNOSIS — N2581 Secondary hyperparathyroidism of renal origin: Secondary | ICD-10-CM | POA: Diagnosis not present

## 2021-04-29 DIAGNOSIS — Z992 Dependence on renal dialysis: Secondary | ICD-10-CM | POA: Diagnosis not present

## 2021-04-29 DIAGNOSIS — N2581 Secondary hyperparathyroidism of renal origin: Secondary | ICD-10-CM | POA: Diagnosis not present

## 2021-04-29 DIAGNOSIS — N186 End stage renal disease: Secondary | ICD-10-CM | POA: Diagnosis not present

## 2021-04-30 DIAGNOSIS — Z992 Dependence on renal dialysis: Secondary | ICD-10-CM | POA: Diagnosis not present

## 2021-04-30 DIAGNOSIS — N186 End stage renal disease: Secondary | ICD-10-CM | POA: Diagnosis not present

## 2021-04-30 DIAGNOSIS — N049 Nephrotic syndrome with unspecified morphologic changes: Secondary | ICD-10-CM | POA: Diagnosis not present

## 2021-05-02 ENCOUNTER — Emergency Department
Admission: EM | Admit: 2021-05-02 | Discharge: 2021-05-02 | Disposition: A | Discharge status: Home / Self Care | Payer: Federal, State, Local not specified - PPO | Attending: Emergency Medicine | Admitting: Emergency Medicine

## 2021-05-02 ENCOUNTER — Other Ambulatory Visit: Payer: Self-pay

## 2021-05-02 DIAGNOSIS — N189 Chronic kidney disease, unspecified: Secondary | ICD-10-CM | POA: Insufficient documentation

## 2021-05-02 DIAGNOSIS — M545 Low back pain, unspecified: Secondary | ICD-10-CM | POA: Insufficient documentation

## 2021-05-02 DIAGNOSIS — I129 Hypertensive chronic kidney disease with stage 1 through stage 4 chronic kidney disease, or unspecified chronic kidney disease: Secondary | ICD-10-CM | POA: Diagnosis not present

## 2021-05-02 MED ORDER — LIDOCAINE 5 % EX PTCH: 1.0000 | MEDICATED_PATCH | Freq: Two times a day (BID) | CUTANEOUS | 0 refills | Status: AC

## 2021-05-02 MED ORDER — LIDOCAINE 5 % EX PTCH
2.0000 | MEDICATED_PATCH | CUTANEOUS | Status: DC
  Administered 2021-05-02: 2 via TRANSDERMAL
  Filled 2021-05-02: qty 2

## 2021-05-02 MED ORDER — OXYCODONE HCL 5 MG PO TABS
5.0000 mg | ORAL_TABLET | Freq: Three times a day (TID) | ORAL | 0 refills | Status: AC | PRN
Start: 2021-05-02 — End: 2021-05-05

## 2021-05-02 MED ORDER — OXYCODONE HCL 5 MG PO TABS
5.0000 mg | ORAL_TABLET | Freq: Once | ORAL | Status: AC
  Administered 2021-05-02: 5 mg via ORAL
  Filled 2021-05-02: qty 1

## 2021-05-02 MED ORDER — CYCLOBENZAPRINE HCL 10 MG PO TABS: 10.0000 mg | ORAL_TABLET | Freq: Three times a day (TID) | ORAL | 0 refills | Status: AC | PRN

## 2021-05-02 NOTE — ED Provider Notes (Signed)
? ?[facility] ?Provider Note ? ? ? Event Date/Time  ? First MD Initiated Contact with Patient 05/02/21 1212   ?  (approximate) ? ? ?History  ? ?Chief Complaint ?Back Pain ? ? ?HPI ?Hector Brooks is a 51 y.o. male, history of hypertension, DVT, morbid obesity, prediabetes, OSA CKD, presents to the emergency department for evaluation of low back pain.  Patient states that his back pain has been going on for the past 2 days.  He has taken Tylenol/Aleve with minimal relief.  Patient states that he is able to ambulate, though with difficulty.  Describes sensation as spasm-like.  Endorses decreased appetite due to the pain and subsequently no bowel movements in the past 2 days.  Denies any recent illnesses or injuries.  Denies fever/chills, chest pain, shortness of breath, urinary symptoms, saddle anesthesia, urinary symptoms, numbness/tingling in lower extremities, headache, or fever/chills ? ?History Limitations: No limitations. ? ?  ? ? ?Physical Exam  ?Triage Vital Signs: ?ED Triage Vitals [05/02/21 1203]  ?Enc Vitals Group  ?   BP (!) 158/100  ?   Pulse Rate (!) 131  ?   Resp 20  ?   Temp 99.1 ?F (37.3 ?C)  ?   Temp Source Oral  ?   SpO2 97 %  ?   Weight 290 lb (131.5 kg)  ?   Height 5\' 9"  (1.753 m)  ?   Head Circumference   ?   Peak Flow   ?   Pain Score 8  ?   Pain Loc   ?   Pain Edu?   ?   Excl. in [place]?   ? ? ?Most recent vital signs: ?Vitals:  ? 05/02/21 1203 05/02/21 1336  ?BP: (!) 158/100 (!) 150/87  ?Pulse: (!) 131 (!) 108  ?Resp: 20 16  ?Temp: 99.1 ?F (37.3 ?C)   ?SpO2: 97% 97%  ? ? ?General: Awake, notably uncomfortable ?CV: Good peripheral perfusion.  ?Resp: Normal effort.  ?Abd: Soft, non-tender. No distention.  ?Neuro: At baseline. No gross neurological deficits. ?Other: Significant paraspinal muscle tightness appreciated bilaterally in the lumbar region no midline spinal tenderness.  Patient is able to ambulate, though with difficulty.  Normal range of motion of upper and lower  extremities.  Pulse, motor, sensation intact in all extremities.  No CVA tenderness.  Normal range of motion of the neck.  ? ?Physical Exam ? ? ? ?ED Results / Procedures / Treatments  ?Labs ?(all labs ordered are listed, but only abnormal results are displayed) ?Labs Reviewed - No data to display ? ? ?EKG ?Not applicable ? ? ?RADIOLOGY ? ?ED Provider Interpretation: Not applicable. ? ?No results found. ? ?PROCEDURES: ? ?Critical Care performed: None. ? ?Procedures ? ? ? ?MEDICATIONS ORDERED IN ED: ?Medications  ?lidocaine (LIDODERM) 5 % 2 patch (2 patches Transdermal Patch Applied 05/02/21 1257)  ?oxyCODONE (Oxy IR/ROXICODONE) immediate release tablet 5 mg (5 mg Oral Given 05/02/21 1257)  ? ? ? ?IMPRESSION / MDM / ASSESSMENT AND PLAN / ED COURSE  ?I reviewed the triage vital signs and the nursing notes. ?             ?               ? ? ?Differential diagnosis includes, but is not limited to, lumbosacral strain, herniated disc, vertebral fracture, nephrolithiasis ? ?ED Course ?Patient appears uncomfortable, but well.  Notably tachycardic, presumably due to pain.  We will go ahead and treat with lidocaine  patches and oxycodone. ? ? ?Assessment/Plan ?Presentation consistent with bilateral lumbar muscle strains.  No midline spinal tenderness to suggest spinal process.  No neurovascular dysfunction appreciated.  Patient still able to ambulate on his own.  We will plan to discharge him with cyclobenzaprine, lidocaine patches, and a very short supply of oxycodone to be taken as needed if these other measures fail.  We will provide him a referral to orthopedics.  We will discharge this patient. ? ?Patient was provided with anticipatory guidance, return precautions, and educational material. Encouraged the patient to return to the emergency department at any time if they begin to experience any new or worsening symptoms.  ? ?  ? ? ?FINAL CLINICAL IMPRESSION(S) / ED DIAGNOSES  ? ?Final diagnoses:  ?Acute bilateral low back pain  without sciatica  ? ? ? ?Rx / DC Orders  ? ?ED Discharge Orders   ? ?      Ordered  ?  lidocaine (LIDODERM) 5 %  Every 12 hours       ? 05/02/21 1254  ?  cyclobenzaprine (FLEXERIL) 10 MG tablet  3 times daily PRN       ? 05/02/21 1254  ?  oxyCODONE (ROXICODONE) 5 MG immediate release tablet  Every 8 hours PRN       ? 05/02/21 1254  ? ?  ?  ? ?  ? ? ? ?Note:  This document was prepared using Dragon voice recognition software and may include unintentional dictation errors. ?  ?Teodoro Spray, [place] ?05/02/21 1617 ? ?  ?Nena Polio, MD ?05/02/21 1642 ? ?

## 2021-05-02 NOTE — Discharge Instructions (Addendum)
-  Take Tylenol, cyclobenzaprine, and lidocaine patches as needed for the pain.  Use oxycodone sparingly and only as needed if other interventions fail ?-Follow-up with the orthopedist listed above, as discussed. ?

## 2021-05-02 NOTE — ED Notes (Signed)
Pt states he is a dialysis patient and concerned about his kidney function due to the lower back pain x several days. ?

## 2021-05-02 NOTE — ED Triage Notes (Signed)
Pt here with back pain x2 days. Pt states that he was awoken out of his sleep by the sharp pain and describes it as spasms. Pt also has not had a BM in 2 days. Pt ambulatory to triage. ?

## 2021-05-03 DIAGNOSIS — N2581 Secondary hyperparathyroidism of renal origin: Secondary | ICD-10-CM | POA: Diagnosis not present

## 2021-05-03 DIAGNOSIS — Z992 Dependence on renal dialysis: Secondary | ICD-10-CM | POA: Diagnosis not present

## 2021-05-03 DIAGNOSIS — N186 End stage renal disease: Secondary | ICD-10-CM | POA: Diagnosis not present

## 2021-05-07 DIAGNOSIS — Z992 Dependence on renal dialysis: Secondary | ICD-10-CM | POA: Diagnosis not present

## 2021-05-07 DIAGNOSIS — N2581 Secondary hyperparathyroidism of renal origin: Secondary | ICD-10-CM | POA: Diagnosis not present

## 2021-05-07 DIAGNOSIS — N186 End stage renal disease: Secondary | ICD-10-CM | POA: Diagnosis not present

## 2021-05-08 ENCOUNTER — Inpatient Hospital Stay
Admission: EM | Admit: 2021-05-08 | Discharge: 2021-05-12 | DRG: 308 | Disposition: A | Discharge status: Home / Self Care | Payer: Federal, State, Local not specified - PPO | Attending: Internal Medicine | Admitting: Internal Medicine

## 2021-05-08 ENCOUNTER — Other Ambulatory Visit: Payer: Self-pay

## 2021-05-08 ENCOUNTER — Emergency Department: Payer: Federal, State, Local not specified - PPO

## 2021-05-08 DIAGNOSIS — I82531 Chronic embolism and thrombosis of right popliteal vein: Secondary | ICD-10-CM | POA: Diagnosis present

## 2021-05-08 DIAGNOSIS — Z833 Family history of diabetes mellitus: Secondary | ICD-10-CM

## 2021-05-08 DIAGNOSIS — I120 Hypertensive chronic kidney disease with stage 5 chronic kidney disease or end stage renal disease: Secondary | ICD-10-CM | POA: Diagnosis present

## 2021-05-08 DIAGNOSIS — Z992 Dependence on renal dialysis: Secondary | ICD-10-CM

## 2021-05-08 DIAGNOSIS — Z86718 Personal history of other venous thrombosis and embolism: Secondary | ICD-10-CM

## 2021-05-08 DIAGNOSIS — Z7901 Long term (current) use of anticoagulants: Secondary | ICD-10-CM

## 2021-05-08 DIAGNOSIS — Z20822 Contact with and (suspected) exposure to covid-19: Secondary | ICD-10-CM | POA: Diagnosis present

## 2021-05-08 DIAGNOSIS — I82511 Chronic embolism and thrombosis of right femoral vein: Secondary | ICD-10-CM | POA: Diagnosis present

## 2021-05-08 DIAGNOSIS — R000 Tachycardia, unspecified: Secondary | ICD-10-CM | POA: Diagnosis not present

## 2021-05-08 DIAGNOSIS — Z83438 Family history of other disorder of lipoprotein metabolism and other lipidemia: Secondary | ICD-10-CM

## 2021-05-08 DIAGNOSIS — Z6841 Body Mass Index (BMI) 40.0 and over, adult: Secondary | ICD-10-CM

## 2021-05-08 DIAGNOSIS — I953 Hypotension of hemodialysis: Secondary | ICD-10-CM | POA: Diagnosis not present

## 2021-05-08 DIAGNOSIS — E875 Hyperkalemia: Secondary | ICD-10-CM | POA: Diagnosis not present

## 2021-05-08 DIAGNOSIS — M545 Low back pain, unspecified: Secondary | ICD-10-CM

## 2021-05-08 DIAGNOSIS — N186 End stage renal disease: Secondary | ICD-10-CM | POA: Diagnosis present

## 2021-05-08 DIAGNOSIS — R109 Unspecified abdominal pain: Secondary | ICD-10-CM | POA: Diagnosis present

## 2021-05-08 DIAGNOSIS — I10 Essential (primary) hypertension: Secondary | ICD-10-CM

## 2021-05-08 DIAGNOSIS — D631 Anemia in chronic kidney disease: Secondary | ICD-10-CM | POA: Diagnosis not present

## 2021-05-08 DIAGNOSIS — G4733 Obstructive sleep apnea (adult) (pediatric): Secondary | ICD-10-CM | POA: Diagnosis not present

## 2021-05-08 DIAGNOSIS — E872 Acidosis, unspecified: Secondary | ICD-10-CM | POA: Diagnosis present

## 2021-05-08 DIAGNOSIS — E785 Hyperlipidemia, unspecified: Secondary | ICD-10-CM | POA: Diagnosis present

## 2021-05-08 DIAGNOSIS — R1084 Generalized abdominal pain: Secondary | ICD-10-CM

## 2021-05-08 DIAGNOSIS — Z8249 Family history of ischemic heart disease and other diseases of the circulatory system: Secondary | ICD-10-CM

## 2021-05-08 DIAGNOSIS — Z79899 Other long term (current) drug therapy: Secondary | ICD-10-CM

## 2021-05-08 DIAGNOSIS — I82501 Chronic embolism and thrombosis of unspecified deep veins of right lower extremity: Secondary | ICD-10-CM | POA: Diagnosis present

## 2021-05-08 DIAGNOSIS — R252 Cramp and spasm: Secondary | ICD-10-CM

## 2021-05-08 DIAGNOSIS — I160 Hypertensive urgency: Secondary | ICD-10-CM | POA: Diagnosis present

## 2021-05-08 DIAGNOSIS — R7881 Bacteremia: Secondary | ICD-10-CM | POA: Diagnosis present

## 2021-05-08 DIAGNOSIS — K5900 Constipation, unspecified: Secondary | ICD-10-CM | POA: Diagnosis present

## 2021-05-08 LAB — COMPREHENSIVE METABOLIC PANEL
ALT: 8 U/L (ref 0–44)
AST: 10 U/L — ABNORMAL LOW (ref 15–41)
Albumin: 3.5 g/dL (ref 3.5–5.0)
Alkaline Phosphatase: 38 U/L (ref 38–126)
Anion gap: 10 (ref 5–15)
BUN: 132 mg/dL — ABNORMAL HIGH (ref 6–20)
CO2: 16 mmol/L — ABNORMAL LOW (ref 22–32)
Calcium: 9 mg/dL (ref 8.9–10.3)
Chloride: 112 mmol/L — ABNORMAL HIGH (ref 98–111)
Creatinine, Ser: 17.89 mg/dL — ABNORMAL HIGH (ref 0.61–1.24)
GFR, Estimated: 3 mL/min — ABNORMAL LOW (ref >60)
Glucose, Bld: 93 mg/dL (ref 70–99)
Potassium: 7.3 mmol/L (ref 3.5–5.1)
Sodium: 138 mmol/L (ref 135–145)
Total Bilirubin: 0.9 mg/dL (ref 0.3–1.2)
Total Protein: 8 g/dL (ref 6.5–8.1)

## 2021-05-08 LAB — CBC WITH DIFFERENTIAL/PLATELET
Abs Immature Granulocytes: 0.02 10*3/uL (ref 0.00–0.07)
Basophils Absolute: 0 10*3/uL (ref 0.0–0.1)
Basophils Relative: 1 %
Eosinophils Absolute: 0.2 10*3/uL (ref 0.0–0.5)
Eosinophils Relative: 3 %
HCT: 29.4 % — ABNORMAL LOW (ref 39.0–52.0)
Hemoglobin: 8.7 g/dL — ABNORMAL LOW (ref 13.0–17.0)
Immature Granulocytes: 0 %
Lymphocytes Relative: 15 %
Lymphs Abs: 1 10*3/uL (ref 0.7–4.0)
MCH: 28.2 pg (ref 26.0–34.0)
MCHC: 29.6 g/dL — ABNORMAL LOW (ref 30.0–36.0)
MCV: 95.5 fL (ref 80.0–100.0)
Monocytes Absolute: 0.5 10*3/uL (ref 0.1–1.0)
Monocytes Relative: 8 %
Neutro Abs: 4.8 10*3/uL (ref 1.7–7.7)
Neutrophils Relative %: 73 %
Platelets: 204 10*3/uL (ref 150–400)
RBC: 3.08 MIL/uL — ABNORMAL LOW (ref 4.22–5.81)
RDW: 14.6 % (ref 11.5–15.5)
WBC: 6.6 10*3/uL (ref 4.0–10.5)
nRBC: 0 % (ref 0.0–0.2)

## 2021-05-08 LAB — LIPASE, BLOOD: Lipase: 39 U/L (ref 11–51)

## 2021-05-08 MED ORDER — INSULIN ASPART 100 UNIT/ML IV SOLN
5.0000 [IU] | Freq: Once | INTRAVENOUS | Status: AC
  Administered 2021-05-09: 5 [IU] via INTRAVENOUS
  Filled 2021-05-08: qty 0.05

## 2021-05-08 MED ORDER — CALCIUM GLUCONATE-NACL 1-0.675 GM/50ML-% IV SOLN
1.0000 g | Freq: Once | INTRAVENOUS | Status: AC
  Administered 2021-05-09: 1000 mg via INTRAVENOUS
  Filled 2021-05-08: qty 50

## 2021-05-08 MED ORDER — SODIUM BICARBONATE 8.4 % IV SOLN
50.0000 meq | Freq: Once | INTRAVENOUS | Status: AC
  Administered 2021-05-09: 50 meq via INTRAVENOUS
  Filled 2021-05-08: qty 50

## 2021-05-08 MED ORDER — DEXTROSE 50 % IV SOLN
1.0000 | Freq: Once | INTRAVENOUS | Status: AC
  Administered 2021-05-09: 50 mL via INTRAVENOUS
  Filled 2021-05-08: qty 50

## 2021-05-08 MED ORDER — ONDANSETRON HCL 4 MG/2ML IJ SOLN
4.0000 mg | Freq: Once | INTRAMUSCULAR | Status: AC
  Administered 2021-05-08: 4 mg via INTRAVENOUS
  Filled 2021-05-08: qty 2

## 2021-05-08 MED ORDER — HYDROMORPHONE HCL 1 MG/ML IJ SOLN
1.0000 mg | Freq: Once | INTRAMUSCULAR | Status: AC
  Administered 2021-05-08: 1 mg via INTRAVENOUS
  Filled 2021-05-08: qty 1

## 2021-05-08 NOTE — ED Provider Notes (Addendum)
? ?[facility] ?Provider Note ? ? ? Event Date/Time  ? First MD Initiated Contact with Patient 05/08/21 2217   ?  (approximate) ? ? ?History  ? ?Back Pain ? ? ?HPI ? ?Hector Brooks is a 51 y.o. male with hypertension, obesity, OSA, CKD who comes in with concerns for back pain.  Patient reports having 2 weeks of back pain.  He states that he had the back pain on his bilateral flanks but now it is starting to wrap around the lower part of his abdomen.  He denies any surgeries on his abdomen.  He states that he had decreased bowel movements but this was prior to starting any oxycodone or pain medication.  Patient is ESRD has a left arm fistula gets 4 times dialysis but has not had dialysis due to the pain.  They do dialysis sessions at home.  He denies any chest pain, shortness of breath.  Patient still able to ambulate.  Good strength in his legs.  No numbness or tingling in his legs ? ?Review of records patient was seen on 3/2 and it was noted that he was discharged on oxycodone, Flexeril ? ? ?Physical Exam  ? ?Triage Vital Signs: ?ED Triage Vitals  ?Enc Vitals Group  ?   BP 05/08/21 2147 (!) 194/117  ?   Pulse Rate 05/08/21 2147 (!) 139  ?   Resp 05/08/21 2147 18  ?   Temp 05/08/21 2147 98.2 ?F (36.8 ?C)  ?   Temp Source 05/08/21 2147 Oral  ?   SpO2 05/08/21 2147 97 %  ?   Weight 05/08/21 2158 (!) 304 lb 0.2 oz (137.9 kg)  ?   Height --   ?   Head Circumference --   ?   Peak Flow --   ?   Pain Score 05/08/21 2156 8  ?   Pain Loc --   ?   Pain Edu? --   ?   Excl. in [place]? --   ? ? ?Most recent vital signs: ?Vitals:  ? 05/08/21 2147  ?BP: (!) 194/117  ?Pulse: (!) 139  ?Resp: 18  ?Temp: 98.2 ?F (36.8 ?C)  ?SpO2: 97%  ? ? ? ?General: Awake, no distress.  Patient appears uncomfortable ?CV:  Good peripheral perfusion.  Tachycardic ?Resp:  Normal effort.  ?Abd:  No distention.  Positive pain ?Other:  Good strength in his legs.  Sensation intact.  No midline back tenderness.  Left arm  fistula ? ? ?ED Results / Procedures / Treatments  ? ?Labs ?(all labs ordered are listed, but only abnormal results are displayed) ?Labs Reviewed - No data to display ? ? ?EKG ? ?My interpretation of EKG: ? ?Sinus tachycardia rate 138 without any ST elevation or T wave inversions, normal intervals ? ?RADIOLOGY ?CT pending  ? ? ?PROCEDURES: ? ?Critical Care performed: No ? ?.1-3 Lead EKG Interpretation ?Performed by: Vanessa Armona, MD ?Authorized by: Vanessa Nazareth, MD  ? ?  Interpretation: abnormal   ?  ECG rate:  130 ?  ECG rate assessment: tachycardic   ?  Rhythm: sinus tachycardia   ?  Ectopy: none   ?  Conduction: normal   ?.Critical Care ?Performed by: Vanessa Watson, MD ?Authorized by: Vanessa , MD  ? ?Critical care provider statement:  ?  Critical care time (minutes):  30 ?  Critical care was necessary to treat or prevent imminent or life-threatening deterioration of the following conditions:  Renal failure ?  Critical care was time spent personally by me on the following activities:  Development of treatment plan with patient or surrogate, discussions with consultants, evaluation of patient's response to treatment, examination of patient, ordering and review of laboratory studies, ordering and review of radiographic studies, ordering and performing treatments and interventions, pulse oximetry, re-evaluation of patient's condition and review of old charts ? ? ?MEDICATIONS ORDERED IN ED: ?Medications  ?calcium gluconate 1 g/ 50 mL sodium chloride IVPB (1,000 mg Intravenous New Bag/Given 05/09/21 0004)  ?insulin aspart (novoLOG) injection 5 Units (has no administration in time range)  ?  And  ?dextrose 50 % solution 50 mL (has no administration in time range)  ?sodium bicarbonate injection 50 mEq (has no administration in time range)  ?HYDROmorphone (DILAUDID) injection 1 mg (1 mg Intravenous Given 05/08/21 2316)  ?ondansetron [facility]) injection 4 mg (4 mg Intravenous Given 05/08/21 2316)  ? ? ? ?IMPRESSION / MDM  / ASSESSMENT AND PLAN / ED COURSE  ?I reviewed the triage vital signs and the nursing notes. ?             ?               ? ?Patient comes in with significant abdominal pain, back pain with significant tachycardia.  Labs to evaluate for Electra abnormalities given missed dialysis.  Will get CT scan and evaluate for obstruction, perforation or other acute pathology ? ?Patient be handed off to oncoming team pending labs, CT imaging.  We will give 1 dose of IV Dilaudid while pending results ? ?CT no obstruction per my read ? ?Lipase normal.  Potassium significantly elevated.  BUN elevated.  CBC slightly low hemoglobin ? ?Marland Kitchen  Discussed with Dr. Candiss Norse recommends dialysis.  We will give some K shifters.  Patient resting more comfortably after Dilaudid.  Discussed with family needing dialysis they expressed understanding.  Will admit to the hospital team ? ?FINAL CLINICAL IMPRESSION(S) / ED DIAGNOSES  ? ?Final diagnoses:  ?Tachycardia  ?Low back pain, unspecified back pain laterality, unspecified chronicity, unspecified whether sciatica present  ?Hyperkalemia  ? ? ? ?Rx / DC Orders  ? ?ED Discharge Orders   ? ? None  ? ?  ? ? ? ?Note:  This document was prepared using Dragon voice recognition software and may include unintentional dictation errors. ?  ?Vanessa Caldwell, MD ?05/08/21 2233 ? ?  ?Vanessa Fenton, MD ?05/08/21 2307 ? ?  ?Vanessa Douglass Hills, MD ?05/09/21 0011 ? ?

## 2021-05-08 NOTE — ED Triage Notes (Signed)
Pt presents to ER c/o back pain since 2/28.  Pt states he has been seen here and discharged with rx for pain meds and muscle relaxers and states he has been taking as prescribed with little relief.  Pt states he has not had a BM since his pain started.  Pt denies urinary symptoms.  Pt is a M, T, Th, F dialysis.  Pt missed dialysis yesterday.  Pt A&O x4 at this time in NAD.   ?

## 2021-05-09 ENCOUNTER — Observation Stay: Payer: Federal, State, Local not specified - PPO

## 2021-05-09 ENCOUNTER — Encounter: Payer: Self-pay | Admitting: Family Medicine

## 2021-05-09 DIAGNOSIS — E8721 Acute metabolic acidosis: Secondary | ICD-10-CM | POA: Diagnosis not present

## 2021-05-09 DIAGNOSIS — E785 Hyperlipidemia, unspecified: Secondary | ICD-10-CM | POA: Diagnosis present

## 2021-05-09 DIAGNOSIS — R000 Tachycardia, unspecified: Secondary | ICD-10-CM | POA: Diagnosis not present

## 2021-05-09 DIAGNOSIS — Z7901 Long term (current) use of anticoagulants: Secondary | ICD-10-CM | POA: Diagnosis not present

## 2021-05-09 DIAGNOSIS — I82501 Chronic embolism and thrombosis of unspecified deep veins of right lower extremity: Secondary | ICD-10-CM | POA: Diagnosis present

## 2021-05-09 DIAGNOSIS — A419 Sepsis, unspecified organism: Secondary | ICD-10-CM | POA: Diagnosis not present

## 2021-05-09 DIAGNOSIS — Z6841 Body Mass Index (BMI) 40.0 and over, adult: Secondary | ICD-10-CM | POA: Diagnosis not present

## 2021-05-09 DIAGNOSIS — I517 Cardiomegaly: Secondary | ICD-10-CM | POA: Diagnosis not present

## 2021-05-09 DIAGNOSIS — Z20822 Contact with and (suspected) exposure to covid-19: Secondary | ICD-10-CM | POA: Diagnosis present

## 2021-05-09 DIAGNOSIS — I953 Hypotension of hemodialysis: Secondary | ICD-10-CM | POA: Diagnosis present

## 2021-05-09 DIAGNOSIS — Z8249 Family history of ischemic heart disease and other diseases of the circulatory system: Secondary | ICD-10-CM | POA: Diagnosis not present

## 2021-05-09 DIAGNOSIS — N186 End stage renal disease: Secondary | ICD-10-CM

## 2021-05-09 DIAGNOSIS — E872 Acidosis, unspecified: Secondary | ICD-10-CM | POA: Diagnosis present

## 2021-05-09 DIAGNOSIS — R109 Unspecified abdominal pain: Secondary | ICD-10-CM | POA: Diagnosis present

## 2021-05-09 DIAGNOSIS — Z992 Dependence on renal dialysis: Secondary | ICD-10-CM | POA: Diagnosis not present

## 2021-05-09 DIAGNOSIS — K5900 Constipation, unspecified: Secondary | ICD-10-CM | POA: Diagnosis not present

## 2021-05-09 DIAGNOSIS — R1084 Generalized abdominal pain: Secondary | ICD-10-CM | POA: Insufficient documentation

## 2021-05-09 DIAGNOSIS — G4733 Obstructive sleep apnea (adult) (pediatric): Secondary | ICD-10-CM | POA: Diagnosis present

## 2021-05-09 DIAGNOSIS — N2581 Secondary hyperparathyroidism of renal origin: Secondary | ICD-10-CM | POA: Diagnosis not present

## 2021-05-09 DIAGNOSIS — Z79899 Other long term (current) drug therapy: Secondary | ICD-10-CM | POA: Diagnosis not present

## 2021-05-09 DIAGNOSIS — Z83438 Family history of other disorder of lipoprotein metabolism and other lipidemia: Secondary | ICD-10-CM | POA: Diagnosis not present

## 2021-05-09 DIAGNOSIS — D631 Anemia in chronic kidney disease: Secondary | ICD-10-CM | POA: Diagnosis not present

## 2021-05-09 DIAGNOSIS — E875 Hyperkalemia: Secondary | ICD-10-CM | POA: Diagnosis present

## 2021-05-09 DIAGNOSIS — Z833 Family history of diabetes mellitus: Secondary | ICD-10-CM | POA: Diagnosis not present

## 2021-05-09 DIAGNOSIS — I160 Hypertensive urgency: Secondary | ICD-10-CM | POA: Diagnosis present

## 2021-05-09 DIAGNOSIS — I82511 Chronic embolism and thrombosis of right femoral vein: Secondary | ICD-10-CM | POA: Diagnosis not present

## 2021-05-09 DIAGNOSIS — I82531 Chronic embolism and thrombosis of right popliteal vein: Secondary | ICD-10-CM | POA: Diagnosis present

## 2021-05-09 DIAGNOSIS — I120 Hypertensive chronic kidney disease with stage 5 chronic kidney disease or end stage renal disease: Secondary | ICD-10-CM | POA: Diagnosis present

## 2021-05-09 HISTORY — DX: Hyperkalemia: E87.5

## 2021-05-09 HISTORY — DX: Acidosis, unspecified: E87.20

## 2021-05-09 HISTORY — DX: Constipation, unspecified: K59.00

## 2021-05-09 HISTORY — DX: Unspecified abdominal pain: R10.9

## 2021-05-09 LAB — PHOSPHORUS: Phosphorus: 7.2 mg/dL — ABNORMAL HIGH (ref 2.5–4.6)

## 2021-05-09 LAB — BASIC METABOLIC PANEL
Anion gap: 15 (ref 5–15)
BUN: 68 mg/dL — ABNORMAL HIGH (ref 6–20)
CO2: 23 mmol/L (ref 22–32)
Calcium: 8.3 mg/dL — ABNORMAL LOW (ref 8.9–10.3)
Chloride: 98 mmol/L (ref 98–111)
Creatinine, Ser: 8.37 mg/dL — ABNORMAL HIGH (ref 0.61–1.24)
GFR, Estimated: 7 mL/min — ABNORMAL LOW (ref >60)
Glucose, Bld: 82 mg/dL (ref 70–99)
Potassium: 3.9 mmol/L (ref 3.5–5.1)
Sodium: 136 mmol/L (ref 135–145)

## 2021-05-09 LAB — CBC
HCT: 26.8 % — ABNORMAL LOW (ref 39.0–52.0)
Hemoglobin: 8.1 g/dL — ABNORMAL LOW (ref 13.0–17.0)
MCH: 28.8 pg (ref 26.0–34.0)
MCHC: 30.2 g/dL (ref 30.0–36.0)
MCV: 95.4 fL (ref 80.0–100.0)
Platelets: 157 10*3/uL (ref 150–400)
RBC: 2.81 MIL/uL — ABNORMAL LOW (ref 4.22–5.81)
RDW: 14.4 % (ref 11.5–15.5)
WBC: 6.7 10*3/uL (ref 4.0–10.5)
nRBC: 0 % (ref 0.0–0.2)

## 2021-05-09 LAB — URINALYSIS, ROUTINE W REFLEX MICROSCOPIC
Bacteria, UA: NONE SEEN
Bilirubin Urine: NEGATIVE
Glucose, UA: 50 mg/dL — AB
Ketones, ur: NEGATIVE mg/dL
Leukocytes,Ua: NEGATIVE
Nitrite: NEGATIVE
Protein, ur: 100 mg/dL — AB
Specific Gravity, Urine: 1.01 (ref 1.005–1.030)
pH: 6 (ref 5.0–8.0)

## 2021-05-09 LAB — HIV ANTIBODY (ROUTINE TESTING W REFLEX): HIV Screen 4th Generation wRfx: NONREACTIVE

## 2021-05-09 LAB — MRSA NEXT GEN BY PCR, NASAL: MRSA by PCR Next Gen: NOT DETECTED

## 2021-05-09 LAB — RESP PANEL BY RT-PCR (FLU A&B, COVID) ARPGX2
Influenza A by PCR: NEGATIVE
Influenza B by PCR: NEGATIVE
SARS Coronavirus 2 by RT PCR: NEGATIVE

## 2021-05-09 LAB — POTASSIUM: Potassium: 7 mmol/L (ref 3.5–5.1)

## 2021-05-09 LAB — HEPATITIS B SURFACE ANTIGEN: Hepatitis B Surface Ag: NONREACTIVE

## 2021-05-09 LAB — HEPATITIS C ANTIBODY: HCV Ab: NONREACTIVE

## 2021-05-09 LAB — HEPATITIS B SURFACE ANTIBODY,QUALITATIVE: Hep B S Ab: REACTIVE — AB

## 2021-05-09 MED ORDER — APIXABAN 5 MG PO TABS: 5.0000 mg | ORAL_TABLET | Freq: Two times a day (BID) | ORAL | Status: DC

## 2021-05-09 MED ORDER — SENNA 8.6 MG PO TABS
1.0000 | ORAL_TABLET | Freq: Two times a day (BID) | ORAL | Status: DC
  Administered 2021-05-09 – 2021-05-12 (×5): 8.6 mg via ORAL
  Filled 2021-05-09 (×8): qty 1

## 2021-05-09 MED ORDER — CALCITRIOL 0.25 MCG PO CAPS
0.2500 ug | ORAL_CAPSULE | Freq: Every day | ORAL | Status: DC
  Administered 2021-05-09 – 2021-05-12 (×4): 0.25 ug via ORAL
  Filled 2021-05-09 (×4): qty 1

## 2021-05-09 MED ORDER — TECHNETIUM TO 99M ALBUMIN AGGREGATED
4.3100 | Freq: Once | INTRAVENOUS | Status: AC
  Administered 2021-05-09: 13:00:00 4.31 via INTRAVENOUS
  Filled 2021-05-09: qty 4.31

## 2021-05-09 MED ORDER — SODIUM CHLORIDE 0.9 % IV SOLN
100.0000 mL | INTRAVENOUS | Status: DC | PRN
  Administered 2021-05-09: 04:00:00 450 mL via INTRAVENOUS

## 2021-05-09 MED ORDER — SODIUM CHLORIDE 0.9 % IV BOLUS
250.0000 mL | Freq: Once | INTRAVENOUS | Status: AC
  Administered 2021-05-09: 04:00:00 250 mL via INTRAVENOUS

## 2021-05-09 MED ORDER — APIXABAN 5 MG PO TABS
10.0000 mg | ORAL_TABLET | Freq: Two times a day (BID) | ORAL | Status: DC
Start: 2021-05-09 — End: 2021-05-12
  Administered 2021-05-09 – 2021-05-12 (×6): 10 mg via ORAL
  Filled 2021-05-09 (×6): qty 2

## 2021-05-09 MED ORDER — HYDRALAZINE HCL 50 MG PO TABS: 25.0000 mg | ORAL_TABLET | Freq: Four times a day (QID) | ORAL | Status: DC | PRN

## 2021-05-09 MED ORDER — LACTULOSE 10 GM/15ML PO SOLN
10.0000 g | Freq: Three times a day (TID) | ORAL | Status: DC
  Administered 2021-05-09: 15:00:00 10 g via ORAL
  Filled 2021-05-09 (×3): qty 30

## 2021-05-09 MED ORDER — BISACODYL 5 MG PO TBEC: 5.0000 mg | DELAYED_RELEASE_TABLET | Freq: Every day | ORAL | Status: DC | PRN

## 2021-05-09 MED ORDER — ACETAMINOPHEN 650 MG RE SUPP: 650.0000 mg | Freq: Four times a day (QID) | RECTAL | Status: DC | PRN

## 2021-05-09 MED ORDER — LACTULOSE 10 GM/15ML PO SOLN: 20.0000 g | Freq: Two times a day (BID) | ORAL | Status: DC | PRN

## 2021-05-09 MED ORDER — ONDANSETRON HCL 4 MG/2ML IJ SOLN: 4.0000 mg | Freq: Four times a day (QID) | INTRAMUSCULAR | Status: DC | PRN

## 2021-05-09 MED ORDER — CALCIUM ACETATE (PHOS BINDER) 667 MG PO CAPS
1334.0000 mg | ORAL_CAPSULE | Freq: Three times a day (TID) | ORAL | Status: DC
  Administered 2021-05-09 – 2021-05-12 (×7): 1334 mg via ORAL
  Filled 2021-05-09 (×8): qty 2

## 2021-05-09 MED ORDER — CHLORHEXIDINE GLUCONATE CLOTH 2 % EX PADS
6.0000 | MEDICATED_PAD | Freq: Every day | CUTANEOUS | Status: DC
  Administered 2021-05-10 – 2021-05-12 (×3): 6 via TOPICAL
  Filled 2021-05-09 (×2): qty 6

## 2021-05-09 MED ORDER — SENNA 8.6 MG PO TABS: 1.0000 | ORAL_TABLET | Freq: Two times a day (BID) | ORAL | Status: DC

## 2021-05-09 MED ORDER — CYCLOBENZAPRINE HCL 10 MG PO TABS
10.0000 mg | ORAL_TABLET | Freq: Three times a day (TID) | ORAL | Status: DC | PRN
  Administered 2021-05-10 (×2): 10 mg via ORAL
  Filled 2021-05-09 (×2): qty 1

## 2021-05-09 MED ORDER — ACETAMINOPHEN 325 MG PO TABS
650.0000 mg | ORAL_TABLET | Freq: Four times a day (QID) | ORAL | Status: DC | PRN
  Administered 2021-05-09 (×2): 650 mg via ORAL
  Filled 2021-05-09 (×2): qty 2

## 2021-05-09 MED ORDER — METOPROLOL TARTRATE 5 MG/5ML IV SOLN
5.0000 mg | INTRAVENOUS | Status: DC | PRN
  Administered 2021-05-09: 19:00:00 5 mg via INTRAVENOUS
  Filled 2021-05-09: qty 5

## 2021-05-09 MED ORDER — METOPROLOL TARTRATE 50 MG PO TABS
50.0000 mg | ORAL_TABLET | Freq: Two times a day (BID) | ORAL | Status: DC
  Administered 2021-05-09 – 2021-05-10 (×2): 50 mg via ORAL
  Filled 2021-05-09 (×2): qty 1

## 2021-05-09 MED ORDER — SODIUM CHLORIDE 0.9% FLUSH
3.0000 mL | Freq: Two times a day (BID) | INTRAVENOUS | Status: DC
  Administered 2021-05-09 – 2021-05-12 (×6): 3 mL via INTRAVENOUS

## 2021-05-09 MED ORDER — ATORVASTATIN CALCIUM 20 MG PO TABS
40.0000 mg | ORAL_TABLET | Freq: Every day | ORAL | Status: DC
  Administered 2021-05-09 – 2021-05-12 (×4): 40 mg via ORAL
  Filled 2021-05-09 (×4): qty 2

## 2021-05-09 MED ORDER — HEPARIN SODIUM (PORCINE) 5000 UNIT/ML IJ SOLN
5000.0000 [IU] | Freq: Three times a day (TID) | INTRAMUSCULAR | Status: DC
  Administered 2021-05-09 (×2): 5000 [IU] via SUBCUTANEOUS
  Filled 2021-05-09 (×2): qty 1

## 2021-05-09 MED ORDER — SODIUM CHLORIDE 0.9 % IV SOLN: 100.0000 mL | INTRAVENOUS | Status: DC | PRN

## 2021-05-09 MED ORDER — LABETALOL HCL 200 MG PO TABS
300.0000 mg | ORAL_TABLET | Freq: Two times a day (BID) | ORAL | Status: DC
  Filled 2021-05-09 (×2): qty 1

## 2021-05-09 MED ORDER — HYDROMORPHONE HCL 1 MG/ML IJ SOLN
0.5000 mg | INTRAMUSCULAR | Status: DC | PRN
  Administered 2021-05-10 – 2021-05-11 (×2): 1 mg via INTRAVENOUS
  Filled 2021-05-09 (×2): qty 1

## 2021-05-09 MED ORDER — HYDRALAZINE HCL 50 MG PO TABS
25.0000 mg | ORAL_TABLET | Freq: Three times a day (TID) | ORAL | Status: DC
Start: 2021-05-09 — End: 2021-05-10
  Administered 2021-05-09 – 2021-05-10 (×3): 25 mg via ORAL
  Filled 2021-05-09 (×4): qty 1

## 2021-05-09 MED ORDER — EPOETIN ALFA 4000 UNIT/ML IJ SOLN
4000.0000 [IU] | INTRAMUSCULAR | Status: DC
  Administered 2021-05-10 – 2021-05-11 (×2): 4000 [IU] via INTRAVENOUS
  Filled 2021-05-09 (×2): qty 1

## 2021-05-09 NOTE — Assessment & Plan Note (Signed)
Due to renal failure.  Resolved after dialysis. ?

## 2021-05-09 NOTE — Consult Note (Signed)
[facility] ?Consult Note: ? ? ? ?Date of Admission:  05/08/2021    ? ?      ?Reason for Consult:     ?Referring Provider: Vianne Bulls, MD ?Primary Care Provider: Pcp, No ? ? ?History of Presenting Illness:  ?Hector Brooks is a 51 y.o. male with end-stage renal disease on hemodialysis for about a year.  Patient states he has been doing home hemodialysis for about 6 months or less.  His wife does cannulation for him.  He states that he last did home hemodialysis on Sunday.  He has not been able to dialyze since then because of difficulty with cannulation and abdominal pain.  He has not had a bowel movement in 2 weeks.  Denies fevers, chills, cough.  He does have some lower extremity edema ?He makes a small amount of urine.  DeniesHematuria or dysuria. ?In the emergency room he was found to have a potassium of 7.3 ?Noncontrast CT of the abdomen shows some nonspecific perirenal stranding ? ? ?Review of Systems: ?Review of Systems  ?Constitutional:  Negative for chills and fever.  ?HENT:  Negative for hearing loss.   ?Eyes:  Negative for blurred vision.  ?Respiratory:  Positive for shortness of breath. Negative for cough and hemoptysis.   ?Cardiovascular:  Positive for leg swelling. Negative for chest pain and palpitations.  ?Gastrointestinal:  Positive for abdominal pain. Negative for heartburn, nausea and vomiting.  ?Genitourinary:  Negative for dysuria and urgency.  ?Musculoskeletal:  Negative for myalgias.  ?Skin:  Negative for rash.  ?Neurological:  Negative for dizziness and headaches.  ?Endo/Heme/Allergies:  Does not bruise/bleed easily.  ?Psychiatric/Behavioral:  The patient is nervous/anxious.   ? ?Past Medical History:  ?Diagnosis Date  ? Anemia   ? Chronic kidney disease 01/2020  ? 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 History of torn meniscus of right knee   ? HLD (hyperlipidemia)   ? HTN (hypertension)   ? Hx of  blood clots   ? Lower extremity edema   ? 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 Sleep apnea   ? does not use cpap  ? Vitamin D deficiency   ? ? ?Social History  ? ?Tobacco Use  ? Smoking status: Never  ? Smokeless tobacco: Never  ?Vaping Use  ? Vaping Use: Never used  ?Substance Use Topics  ? Alcohol use: No  ?  Alcohol/week: 0.0 standard drinks  ? Drug use: No  ? ? ?Family History  ?Problem Relation Age of Onset  ? Cancer Father   ? Diabetes Father   ? Drug abuse Father   ? Hyperlipidemia Sister   ? Diabetes Maternal Grandfather   ? Heart disease Maternal Grandfather   ? ? ? ?OBJECTIVE: ?Blood pressure (!) 172/122, pulse (!) 117, temperature 98.2 ?F (36.8 ?C), temperature source Oral, resp. rate 20, weight (!) 137.9 kg, SpO2 98 %. ? ?Physical Exam ?General appearance: Appears uncomfortable ?HEENT: Moist oral mucous membranes, anicteric sclera ?Pulmonary: Nasal cannula oxygen, clear to auscultation ?Cardiovascular: Tachycardic, no rub or gallop ?Abdomen: Obese, soft, distended, mild diffuse tenderness ?Extremities: 1-2+ edema ?Neuro: Alert, oriented ?Skin: Dry skin, no acute rashes ?Musculoskeletal: No acute joint swellings or effusions ?Dialysis access: Left arm AV fistula ? ? ?Lab Results ?Lab Results  ?Component Value Date  ? WBC 6.6 05/08/2021  ? HGB 8.7 (L) 05/08/2021  ? HCT 29.4 (L) 05/08/2021  ? MCV 95.5 05/08/2021  ? PLT  204 05/08/2021  ?  ?Lab Results  ?Component Value Date  ? CREATININE 17.89 (H) 05/08/2021  ? BUN 132 (H) 05/08/2021  ? NA 138 05/08/2021  ? K 7.3 (HH) 05/08/2021  ? CL 112 (H) 05/08/2021  ? CO2 16 (L) 05/08/2021  ?  ?Lab Results  ?Component Value Date  ? ALT 8 05/08/2021  ? AST 10 (L) 05/08/2021  ? ALKPHOS 38 05/08/2021  ? BILITOT 0.9 05/08/2021  ?  ? ?Microbiology: ?No results found for this or any previous visit (from the past 240 hour(s)). ? ?Medications: ?Scheduled Meds: ?Continuous Infusions: ? calcium gluconate 1,000 mg (05/09/21 0004)  ? ?PRN Meds:. ? ?No Known  Allergies ? ?Urinalysis: ?No results for input(s): COLORURINE, LABSPEC, [place], GLUCOSEU, HGBUR, BILIRUBINUR, KETONESUR, PROTEINUR, UROBILINOGEN, NITRITE, LEUKOCYTESUR in the last 72 hours. ? ?Invalid input(s): APPERANCEUR  ? ? ?Imaging: ?CT ABDOMEN PELVIS WO CONTRAST ? ?Result Date: 05/08/2021 ?CLINICAL DATA:  Back pain. EXAM: CT ABDOMEN AND PELVIS WITHOUT CONTRAST TECHNIQUE: Multidetector CT imaging of the abdomen and pelvis was performed following the standard protocol without IV contrast. RADIATION DOSE REDUCTION: This exam was performed according to the departmental dose-optimization program which includes automated exposure control, adjustment of the mA and/or kV according to patient size and/or use of iterative reconstruction technique. COMPARISON:  None. FINDINGS: Lower chest: No acute abnormality. Hepatobiliary: No focal liver abnormality is seen. No gallstones, gallbladder wall thickening, or biliary dilatation. Pancreas: Unremarkable. No pancreatic ductal dilatation or surrounding inflammatory changes. Spleen: Normal in size without focal abnormality. Adrenals/Urinary Tract: Adrenal glands are unremarkable. The kidneys are slightly small in size and lobulated in appearance. There is no evidence of obstructing renal calculi, focal renal lesion, or hydronephrosis. Mild to moderate severity, bilateral, nonspecific perinephric inflammatory fat stranding is seen. Bladder is unremarkable. Stomach/Bowel: Stomach is within normal limits. Appendix appears normal. No evidence of bowel wall thickening, distention, or inflammatory changes. Noninflamed diverticula are seen within the cecum and sigmoid colon. Vascular/Lymphatic: No significant vascular findings are present. A 16 mm x 13 mm x 15 mm lymph node is seen within the region in between the tail of the pancreas and left kidney. Reproductive: Prostate is unremarkable. Other: A 2.1 cm x 1.8 cm fat containing right inguinal hernia is seen. A similar appearing 3.2  cm x 2.0 cm fat containing left inguinal hernia is noted. No abdominopelvic ascites. Musculoskeletal: No acute or significant osseous findings. IMPRESSION: 1. Bilateral nonspecific perinephric inflammatory fat stranding. While this may be chronic in nature, correlation with urinalysis is recommended to exclude the presence of an acute pyelonephritis. 2. Sigmoid diverticulosis. Electronically Signed   By: Virgina Norfolk M.D.   On: 05/08/2021 23:24   ? ? ? ?Assessment/Plan: ? ?Hector Brooks is a 51 y.o. male with medical problems of Obesity, obstructive sleep apnea, end-stage renal disease on hemodialysis for about a year was admitted on 05/08/2021 for : ? ?Hyperkalemia [E87.5] ? ?[address]/Home hemodialysis/[facility]  ? ?Severe hyperkalemia in the setting of end-stage renal disease ?Patient does home hemodialysis but has not been able to perform dialysis since Sunday because of difficulty with cannulation.   ?Plan to dialyze emergently. ?Dialysis nurse has been notified and orders in place ? ?Metabolic acidosis ?Due to end-stage renal disease and under dialysis ?Expected to correct with hemodialysis ? ?Anemia of chronic kidney disease ?Last hemoglobin 8.7 ?Plan for Epogen with hemodialysis ? ?Severe constipation ?Likely the cause of abdominal pain ?Patient will need aggressive laxative regimen ? ? ? ?Lillianah Swartzentruber ?05/09/21  ?

## 2021-05-09 NOTE — Progress Notes (Signed)
Hemodialysis Post Treatment Note: ? ?Tx date:05/09/2021 ? ?Tx time: Completed full time of 3hours ? ?Access: Left forearm fistula, cannulated away from buttonhole sites ? ?UF Removed: 535m net ? ?Note: ?Experienced symptomatic hypotension, with systolic dropping to 739Y corrected with uf goal break and saline boluses(4539mtotal by dialysis nurse + 25041molus by primary RN). Hemastasis achieved,sites wrapped with gauze/taped.  Post weight not completed due to strecher scale not working and patient too sleepy to stand. ? ? ? ? ? ? ? ? ?  ?

## 2021-05-09 NOTE — Assessment & Plan Note (Addendum)
Patient is on home hemodialysis. ?Required emergent dialysis on admission for K7.3. ?--Nephrology consulted ?-- Dialysis per nephrology ?--Resume usual home dialysis tomorrow 3/13 ?-- Monitor electrolytes and hemodynamics ? ?Patient denied having any cannulation issues with his dialysis access at home or 4 sessions here during admission. ?

## 2021-05-09 NOTE — Assessment & Plan Note (Addendum)
Patient was hypotensive with dialysis and required IV fluids for BP support.  Since then, blood pressure has been rather labile as high as 178/108, as low as 137/89. ?-- Stopped hydralazine given persistent tachycardia ?-- Started metoprolol 100 mg twice daily ?-- Patient reported no longer taking labetalol ?BP fairly well controlled. ?PCP and/or nephrology follow-up in 1 to 2 weeks. ? ?

## 2021-05-09 NOTE — Assessment & Plan Note (Addendum)
Presented with K7.3.  Initially treated medically in the ED with IV calcium gluconate, bicarb, insulin and dextrose.  Underwent emergent hemodialysis. ? ?3/12: Potassium stable 4.9 this morning. ? ?

## 2021-05-09 NOTE — ED Notes (Signed)
Pt refusing to allow this RN to collect MRSA swab at this time. Pt states their legs are cramping and they think they are beginning to feel nauseous. MD Gaetana Michaelis at this time regarding pt's leg cramps and possible nausea.  ?

## 2021-05-09 NOTE — Assessment & Plan Note (Addendum)
Present on admission, patient reporting no BM in about 2 weeks.  Typically goes regularly 1-2 times daily.  This could explain his abdominal and flank pain potentially. ? ?Resolved with bowel regimen including lactulose, senna, Dulcolax if needed ?

## 2021-05-09 NOTE — H&P (Signed)
History and Physical    Hector Brooks CLE:[id number] DOB: 01-Oct-1970 DOA: 05/08/2021  PCP: Pcp, No   Patient coming from: Home   Chief Complaint: Pain in flanks and abdomen   HPI: Hector Brooks is a pleasant 51 y.o. male with medical history significant for ESRD, hypertension, sleep apnea not on CPAP, and history of DVT, now presenting to the emergency department for evaluation of pain in his flanks and abdomen.  Patient reports approximately 2 weeks of progressive pain in the bilateral flanks and more recently involving the abdomen as well.  He still urinates but denies any dysuria, and denies any fever or chills.  He does hemodialysis at home but was having difficulty with this due to his pain and some difficulty with cannulation, and reports that his last dialysis was on 3/4 or 05/05/2021.  ED Course: Upon arrival to the ED, patient is found to be afebrile and saturating in the mid to upper 90s on room air with sinus tachycardia and hypertension.  EKG features sinus tachycardia with narrow QRS.  CT of the abdomen pelvis notable for bilateral nonspecific perinephric inflammatory fat stranding.  Chemistry panel features a potassium of 7.3, bicarbonate 16, and BUN 132.  CBC with hemoglobin 8.7.  Nephrology was consulted by the ED physician and the patient was treated with IV calcium, bicarbonate, insulin with dextrose, and Dilaudid.  Review of Systems:  All other systems reviewed and apart from HPI, are negative.  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 He has never used smokeless tobacco. He reports that he does not drink alcohol and does not use drugs.  No Known Allergies  Family History  Problem Relation Age of Onset   Cancer Father    Diabetes Father    Drug abuse Father    Hyperlipidemia Sister    Diabetes Maternal Grandfather    Heart disease Maternal Grandfather      Prior to Admission medications   Medication Sig Start Date End Date Taking? Authorizing Provider  amLODipine (NORVASC) 10 MG tablet Take 10 mg by mouth daily. 07/29/19   [provider]  atorvastatin (LIPITOR) 80 MG tablet TAKE 1/2 TABLET BY MOUTH DAILY 09/17/20   Maximiano Coss, NP  calcitRIOL (ROCALTROL) 0.25 MCG capsule Take 0.25 mcg by mouth daily. 08/31/19   [provider]  calcium acetate (PHOSLO) 667 MG capsule Take 1,334 mg by mouth 3 (three) times daily. With a meal 01/17/20   [provider]  cyclobenzaprine (FLEXERIL) 10  MG tablet Take 1 tablet (10 mg total) by mouth 3 (three) times daily as needed for up to 10 days for muscle spasms. 05/02/21 05/12/21  Teodoro Spray, PA  FEROSUL 325 (65 Fe) MG tablet Take 325 mg by mouth daily. 02/28/19   [provider]  furosemide (LASIX) 40 MG tablet Take 1 tablet (40 mg total) by mouth daily. 08/16/19   Maximiano Coss, NP  hydrALAZINE (APRESOLINE) 50 MG tablet TAKE 1 TABLET(50 MG) BY MOUTH THREE TIMES DAILY 07/10/20   Lorretta Harp, MD  labetalol (NORMODYNE) 300 MG tablet TAKE 1 TABLET(300 MG) BY MOUTH TWICE DAILY 01/04/21   Maximiano Coss, NP  sodium bicarbonate 650 MG tablet Take 650 mg by mouth 2 (two) times daily.    [provider]    Physical Exam: Vitals:   05/08/21 2349 05/09/21 0000 05/09/21 0015 05/09/21 0215  BP: (!) 161/111 (!) 172/122    Pulse: (!) 120 (!) 121 (!) 117 (!) 109  Resp: '14 19 20 20  '$ Temp:      TempSrc:      SpO2: 95% 95% 98% 100%  Weight:        Constitutional: NAD, calm  Eyes: PERTLA, lids and conjunctivae normal ENMT: Mucous membranes are moist. Posterior pharynx clear of any exudate or lesions.   Neck: supple, no masses  Respiratory: no wheezing, no crackles. No accessory muscle use.  Cardiovascular: S1 & S2 heard, regular rate and rhythm. No extremity edema.   Abdomen: No distension, soft, generally tender without rebound pain or guarding. Bowel sounds active.  Musculoskeletal: no clubbing / cyanosis. No joint deformity upper and lower extremities.   Skin: no significant rashes, lesions, ulcers. Warm, dry, well-perfused. Neurologic: CN 2-12 grossly intact. Moving all extremities. Alert and oriented.  Psychiatric: Pleasant. Cooperative.    Labs and Imaging on Admission: I have personally reviewed following labs and imaging studies  CBC: Recent Labs  Lab 05/08/21 2257  WBC 6.6  NEUTROABS 4.8  HGB 8.7*  HCT 29.4*  MCV 95.5  PLT 546   Basic Metabolic Panel: Recent Labs  Lab 05/08/21 2257  NA 138  K 7.3*  CL 112*  CO2 16*  GLUCOSE 93  BUN 132*  CREATININE 17.89*  CALCIUM 9.0   GFR: Estimated Creatinine Clearance: 6.9 mL/min (A) (by C-G formula based on SCr of 17.89 mg/dL (H)). Liver Function Tests: Recent Labs  Lab 05/08/21 2257  AST 10*  ALT 8  ALKPHOS 38  BILITOT 0.9  PROT 8.0  ALBUMIN 3.5   Recent Labs  Lab 05/08/21 2257  LIPASE 39   No results for input(s): AMMONIA in the last 168 hours. Coagulation Profile: No results for input(s): INR, PROTIME in the last 168 hours. Cardiac Enzymes: No results for input(s): CKTOTAL, CKMB,  CKMBINDEX, TROPONINI in the last 168 hours. BNP (last 3 results) No results for input(s): PROBNP in the last 8760 hours. HbA1C: No results for input(s): HGBA1C in the last 72 hours. CBG: No results for input(s): GLUCAP in the last 168 hours. Lipid Profile: No results for input(s): CHOL, HDL, LDLCALC, TRIG, CHOLHDL, LDLDIRECT in the last 72 hours. Thyroid Function Tests: No results for input(s): TSH, T4TOTAL, FREET4, T3FREE, THYROIDAB in the last 72 hours. Anemia Panel: No results for input(s): VITAMINB12, FOLATE, FERRITIN, TIBC, IRON, RETICCTPCT in the last 72 hours. Urine analysis:    Component Value Date/Time   BILIRUBINUR negative 09/13/2019 1011   KETONESUR negative 09/13/2019 1011   PROTEINUR >=300 (A) 09/13/2019 1011  UROBILINOGEN 0.2 09/13/2019 1011   NITRITE Negative 09/13/2019 1011   LEUKOCYTESUR Trace (A) 09/13/2019 1011   Sepsis Labs: '@LABRCNTIP'$ (procalcitonin:4,lacticidven:4) )No results found for this or any previous visit (from the past 240 hour(s)).   Radiological Exams on Admission: CT ABDOMEN PELVIS WO CONTRAST  Result Date: 05/08/2021 CLINICAL DATA:  Back pain. EXAM: CT ABDOMEN AND PELVIS WITHOUT CONTRAST TECHNIQUE: Multidetector CT imaging of the abdomen and pelvis was performed following the standard protocol without IV contrast. RADIATION DOSE REDUCTION: This exam was performed according to the departmental dose-optimization program which includes automated exposure control, adjustment of the mA and/or kV according to patient size and/or use of iterative reconstruction technique. COMPARISON:  None. FINDINGS: Lower chest: No acute abnormality. Hepatobiliary: No focal liver abnormality is seen. No gallstones, gallbladder wall thickening, or biliary dilatation. Pancreas: Unremarkable. No pancreatic ductal dilatation or surrounding inflammatory changes. Spleen: Normal in size without focal abnormality. Adrenals/Urinary Tract: Adrenal glands are unremarkable. The kidneys  are slightly small in size and lobulated in appearance. There is no evidence of obstructing renal calculi, focal renal lesion, or hydronephrosis. Mild to moderate severity, bilateral, nonspecific perinephric inflammatory fat stranding is seen. Bladder is unremarkable. Stomach/Bowel: Stomach is within normal limits. Appendix appears normal. No evidence of bowel wall thickening, distention, or inflammatory changes. Noninflamed diverticula are seen within the cecum and sigmoid colon. Vascular/Lymphatic: No significant vascular findings are present. A 16 mm x 13 mm x 15 mm lymph node is seen within the region in between the tail of the pancreas and left kidney. Reproductive: Prostate is unremarkable. Other: A 2.1 cm x 1.8 cm fat containing right inguinal hernia is seen. A similar appearing 3.2 cm x 2.0 cm fat containing left inguinal hernia is noted. No abdominopelvic ascites. Musculoskeletal: No acute or significant osseous findings. IMPRESSION: 1. Bilateral nonspecific perinephric inflammatory fat stranding. While this may be chronic in nature, correlation with urinalysis is recommended to exclude the presence of an acute pyelonephritis. 2. Sigmoid diverticulosis. Electronically Signed   By: Virgina Norfolk M.D.   On: 05/08/2021 23:24    EKG: Independently reviewed. Sinus tachycardia, rate 138.   Assessment/Plan   1. Hyperkalemia; metabolic acidosis; ESRD  - ESRD pt on home HD presents with pain in flanks and abdomen and found to have potassium 7.3 with narrow QRS, serum bicarb 16, and BUN 132  - No respiratory s/s  - Appreciate nephrology consultation, planning for emergent HD in ED  - He was treated with IV calcium, bicarb, and insuin/dextrose in ED  - Continue cardiac monitoring, repeat chem panel    2. Abdominal pain  - Presents with worsening pain in b/l flanks and abdomen in setting of constipation  - Start bowel regimen     3. Hypertensive urgency  - BP 190s/110s in ED; asymptomatic  -  Anticipate improvement with HD and pain-control  - Continue hydralazine and labetalol     DVT prophylaxis: sq heparin  Code Status: Full  Level of Care: Level of care: Stepdown Family Communication: none present  Disposition Plan:  Patient is from: Home  Anticipated d/c is to: home  Anticipated d/c date is: Possibly as early as 3/10 or 05/11/21  Patient currently: pending correction of hyperkalemia and acidosis, pain-control  Consults called: Nephrology   Admission status: Inpatient     Vianne Bulls, MD [facility]  05/09/2021, 2:27 AM

## 2021-05-09 NOTE — Assessment & Plan Note (Addendum)
Patient has persistent heart rate in the 120s today.  He reports elevated resting heart rates at baseline.  Denies chest discomfort, shortness of breath.  Does have history of blood clots and not on a blood thinner any longer. ?Lower extremity Dopplers revealed a chronic right lower extremity DVT but VQ scan was low probability for PE. ?Infectious work-up negative including UA, chest x-ray and blood cultures. ?Blood culture initially obtained, grew staph epi which was likely a contaminant.  Repeat blood cultures are negative at 2 days. ?Patient has no signs or symptoms of infection ? ?Heart rates somewhat improved with metoprolol and being off hydralazine.  Recommend outpatient referral to cardiology if persistent. ?

## 2021-05-09 NOTE — Progress Notes (Addendum)
?Progress Note ? ? ?Patient: Hector Brooks KTG:[id number] DOB: 1971-01-12 DOA: 05/08/2021     1 ?DOS: the patient was seen and examined on 05/10/2021 ?  ?Brief hospital course: ?HPI on admission: "Hector Brooks is a pleasant 51 y.o. male with medical history significant for ESRD, hypertension, sleep apnea not on CPAP, and history of DVT, now presenting to the emergency department for evaluation of pain in his flanks and abdomen.  Patient reports approximately 2 weeks of progressive pain in the bilateral flanks and more recently involving the abdomen as well.  He still urinates but denies any dysuria, and denies any fever or chills.  He does hemodialysis at home but was having difficulty with this due to his pain and some difficulty with cannulation, and reports that his last dialysis was on 3/4 or 05/05/2021." ? ?Evaluation in the ED revealed hyperkalemia with K of 7.3. ?CT abdomen/pelvis showed bilateral nonspecific perinephric fat stranding.  Urinalysis was negative for signs of infection.  Admitted to medicine service with nephrology consulted.  Treated with IV calcium, bicarbonate, insulin with dextrose for hyperkalemia and underwent emergent dialysis. ? ?Assessment and Plan: ?* Hyperkalemia ?Presented with K7.3.  Initially treated medically in the ED with IV calcium gluconate, bicarb, insulin and dextrose.  Underwent emergent hemodialysis. ? ?3/9: Potassium level 3.9 this morning. ?-- Continue dialysis per nephrology ?-- Monitor BMP ? ?Abdominal pain ?Presented with generalized abdominal and bilateral flank pain progressive over about 2 weeks.  CT abdomen/pelvis did show some perinephric fat stranding but urinalysis was negative for infection.  Pt reported no BM x 2 weeks, severe constipation likely cause. ? ?3/9: Patient reports improvement ?-- Monitor for now and supportive care ? ?Sinus tachycardia ?Patient has persistent heart rate in the 120s today.  He reports elevated resting heart rates at baseline  but not this high.  Denies chest discomfort, shortness of breath.  Does have history of blood clots and not on a blood thinner any longer. ?-- VTE rule out by lower extremity Dopplers and VQ scan.  D-dimer unhelpful in the setting of renal failure, will be elevated. ?-- Monitor telemetry ?-- UA negative for infection ?-- Chest x-ray with shallow inspiration and chronically elevated right hemidiaphragm.  No infiltrates or opacity to suggest pneumonia and no respiratory symptoms ?-- Check blood culture to complete infectious evaluation ? ?ESRD on dialysis [facility]) ?Patient is on home hemodialysis. ?Required emergent dialysis on admission for K7.3. ?--Nephrology following ?-- Dialysis per nephrology ?-- Monitor electrolytes and hemodynamics ? ?Constipation ?Present on admission, patient reporting no BM in about 2 weeks.  Typically goes regularly 1-2 times daily.  This could explain his abdominal and flank pain potentially. ? ?Bowel regimen ordered including lactulose, senna, Dulcolax if needed ? ?Hypertension ?Patient was hypotensive with dialysis and required IV fluids for BP support.  Since then, blood pressure has been rather labile as high as 178/108, as low as 137/89. ?-- Continue hydralazine, labetalol ?-- As needed hydralazine ?-- Monitor BP and adjust meds as needed for BP goal 140/90 ? ?Metabolic acidosis ?Due to renal failure.  Resolved after dialysis. ? ?Chronic deep vein thrombosis (DVT) of right lower extremity ([place]) ?Patient has not been on anticoagulation prior to admission.  Lower extremity Doppler ultrasound did show a chronic appearing right lower extremity DVT involving the femoral and popliteal veins. ?-- Vascular consulted, question benefit of IVC filter ?--Start Eliquis ? ?Morbid obesity ([place]) ?Body mass index is 44.9 kg/m?[place] Kitchen ?Complicates overall care and prognosis.  Recommend lifestyle modifications including  physical activity and diet for weight loss and overall long-term health. ? ? ?History of DVT  of lower extremity ?Patient reported history of prior blood clots, not currently on a blood thinner.  With tachycardia, concern for recurrent VTE. ?--D-dimer in renal failure will be elevated and unhelpful ?-- Lower extremity Dopplers ?-- VQ scan ?-- VTE prophylaxis ? ? ? ? ?  ? ?Subjective: Patient seen in the ED this morning, holding for a bed.  He reports feeling a lot better after dialysis.  Does report not having a bowel movement for about 2 weeks.  He typically can go at least once every day ? ?Physical Exam: ?Vitals:  ? 05/10/21 0518 05/10/21 0756 05/10/21 0800 05/10/21 0803  ?BP: (!) 143/59 123/83    ?Pulse: (!) 123 (!) 124  (!) 126  ?Resp: '18 20 19   '$ ?Temp: 98.4 ?F (36.9 ?C) 99.2 ?F (37.3 ?C)    ?TempSrc: Oral Oral    ?SpO2: 92% 99%    ?Weight:      ? ?General exam: awake, alert, no acute distress, obese ?HEENT: atraumatic, clear conjunctiva, anicteric sclera, moist mucus membranes, hearing grossly normal  ?Respiratory system: CTAB with diminished bases, no wheezes, rales or rhonchi, normal respiratory effort. ?Cardiovascular system: normal S1/S2, RRR, no pedal edema.   ?Gastrointestinal system: soft, NT, ND, no HSM felt, +bowel sounds. ?Central nervous system: A&O x3. no gross focal neurologic deficits, normal speech ?Extremities: Left upper extremity fistula noted, no edema, normal tone ?Skin: dry, intact, normal temperature, bilateral lower extremity venous stasis ?Psychiatry: normal mood, congruent affect, judgement and insight appear normal ? ? ?Data Reviewed: ? ?Labs reviewed and notable for potassium normalized to 3.9, BUN improved 68, creatinine improved 2.37, calcium 8.3, phosphorus elevated 7.2, hemoglobin 8.1 ? ?Family Communication: None present, will attempt to call ? ?Disposition: ?Status is: Inpatient ?Remains inpatient appropriate because: Persistent tachycardia requiring further evaluation and monitoring ? ? ? ? ? Planned Discharge Destination: Home ? ? ? ?Time spent: 40  minutes ? ?Author: ?Ezekiel Slocumb, DO ?05/10/2021 8:25 AM ? ?For on call review [email or web address].  ?

## 2021-05-09 NOTE — Progress Notes (Signed)
Hemodialysis: ? ?UF goal turned off due to symptomatic hypotension, Dr.Singh notified, gave order for saline bolus with treatment and to leave uf goal off. Patient complaints of dizziness and lightheadness. ER physician also ordered a saline bolus that was given by primary RN. ?

## 2021-05-09 NOTE — Hospital Course (Signed)
HPI on admission: "Hector Brooks is a pleasant 51 y.o. male with medical history significant for ESRD, hypertension, sleep apnea not on CPAP, and history of DVT, now presenting to the emergency department for evaluation of pain in his flanks and abdomen.  Patient reports approximately 2 weeks of progressive pain in the bilateral flanks and more recently involving the abdomen as well.  He still urinates but denies any dysuria, and denies any fever or chills.  He does hemodialysis at home but was having difficulty with this due to his pain and some difficulty with cannulation, and reports that his last dialysis was on 3/4 or 05/05/2021." ? ?Evaluation in the ED revealed hyperkalemia with K of 7.3. ?CT abdomen/pelvis showed bilateral nonspecific perinephric fat stranding.  Urinalysis was negative for signs of infection.  Admitted to medicine service with nephrology consulted.  Treated with IV calcium, bicarbonate, insulin with dextrose for hyperkalemia and underwent emergent dialysis. ?

## 2021-05-09 NOTE — Assessment & Plan Note (Addendum)
Patient reported history of prior blood clots, not currently on a blood thinner.  With tachycardia, concern for recurrent VTE. ?--D-dimer in renal failure will be elevated and unhelpful ?-- Lower extremity Doppler ultrasound showed chronic appearing right lower extremity DVT involving the popliteal and femoral veins ?-- VQ scan low probability for PE ?-- Started on Eliquis ? ?

## 2021-05-09 NOTE — Assessment & Plan Note (Addendum)
Patient has not been on anticoagulation prior to admission.  Lower extremity Doppler ultrasound did show a chronic appearing right lower extremity DVT involving the femoral and popliteal veins. ?--Started on Eliquis ?

## 2021-05-09 NOTE — Progress Notes (Signed)
[facility]  ROUNDING NOTE   Subjective:   Patient seen sitting up on stretcher Alert and oriented Completed breakfast tray at bedside Complaining of lower back pain States he has not had a BM in about 2 weeks.  Objective:  Vital signs in last 24 hours:  Temp:  [97.4 F (36.3 C)-98.9 F (37.2 C)] 97.4 F (36.3 C) (03/09 0600) Pulse Rate:  [109-139] 118 (03/09 1300) Resp:  [14-28] 22 (03/09 1300) BP: (77-195)/(53-141) 137/94 (03/09 1300) SpO2:  [89 %-100 %] 95 % (03/09 1300) Weight:  [137.9 kg] 137.9 kg (03/09 0215)  Weight change:  Filed Weights   05/08/21 2158 05/09/21 0215  Weight: (!) 137.9 kg (!) 137.9 kg    Intake/Output: I/O last 3 completed shifts: In: -  Out: 582 [Other:582]   Intake/Output this shift:  Total I/O In: -  Out: 200 [Urine:200]  Physical Exam: General: NAD,   Head: Normocephalic, atraumatic. Moist oral mucosal membranes  Eyes: Anicteric  Lungs:  Clear to auscultation, normal effort  Heart: Regular rate and rhythm  Abdomen:  Soft, nontender  Extremities:  No peripheral edema.  Neurologic: Nonfocal, moving all four extremities  Skin: No lesions  Access: Lt AVF    Basic Metabolic Panel: Recent Labs  Lab 05/08/21 2257 05/09/21 0205 05/09/21 0559  NA 138  --  136  K 7.3* 7.0* 3.9  CL 112*  --  98  CO2 16*  --  23  GLUCOSE 93  --  82  BUN 132*  --  68*  CREATININE 17.89*  --  8.37*  CALCIUM 9.0  --  8.3*  PHOS  --  7.2*  --     Liver Function Tests: Recent Labs  Lab 05/08/21 2257  AST 10*  ALT 8  ALKPHOS 38  BILITOT 0.9  PROT 8.0  ALBUMIN 3.5   Recent Labs  Lab 05/08/21 2257  LIPASE 39   No results for input(s): AMMONIA in the last 168 hours.  CBC: Recent Labs  Lab 05/08/21 2257 05/09/21 0559  WBC 6.6 6.7  NEUTROABS 4.8  --   HGB 8.7* 8.1*  HCT 29.4* 26.8*  MCV 95.5 95.4  PLT 204 157    Cardiac Enzymes: No results for input(s): CKTOTAL, CKMB, CKMBINDEX, TROPONINI in the last 168  hours.  BNP: Invalid input(s): POCBNP  CBG: No results for input(s): GLUCAP in the last 168 hours.  Microbiology: Results for orders placed or performed during the hospital encounter of 05/08/21  Resp Panel by RT-PCR (Flu A&B, Covid) Nasopharyngeal Swab     Status: None   Collection Time: 05/09/21  3:44 AM   Specimen: Nasopharyngeal Swab; Nasopharyngeal(NP) swabs in vial transport medium  Result Value Ref Range Status   SARS Coronavirus 2 by RT PCR NEGATIVE NEGATIVE Final    Comment: (NOTE) SARS-CoV-2 target nucleic acids are NOT DETECTED.  The SARS-CoV-2 RNA is generally detectable in upper respiratory specimens during the acute phase of infection. The lowest concentration of SARS-CoV-2 viral copies this assay can detect is 138 copies/mL. A negative result does not preclude SARS-Cov-2 infection and should not be used as the sole basis for treatment or other patient management decisions. A negative result may occur with  improper specimen collection/handling, submission of specimen other than nasopharyngeal swab, presence of viral mutation(s) within the areas targeted by this assay, and inadequate number of viral copies(<138 copies/mL). A negative result must be combined with clinical observations, patient history, and epidemiological information. The expected result is Negative.  Fact  Sheet for Patients:  EntrepreneurPulse.com.au  Fact Sheet for Healthcare Providers:  IncredibleEmployment.be  This test is no t yet approved or cleared by the [place] FDA and  has been authorized for detection and/or diagnosis of SARS-CoV-2 by FDA under an Emergency Use Authorization (EUA). This EUA will remain  in effect (meaning this test can be used) for the duration of the COVID-19 declaration under Section 564(b)(1) of the Act, 21 U.S.C.section 360bbb-3(b)(1), unless the authorization is terminated  or revoked sooner.       Influenza A by PCR  NEGATIVE NEGATIVE Final   Influenza B by PCR NEGATIVE NEGATIVE Final    Comment: (NOTE) The Xpert Xpress SARS-CoV-2/FLU/RSV plus assay is intended as an aid in the diagnosis of influenza from Nasopharyngeal swab specimens and should not be used as a sole basis for treatment. Nasal washings and aspirates are unacceptable for Xpert Xpress SARS-CoV-2/FLU/RSV testing.  Fact Sheet for Patients: EntrepreneurPulse.com.au  Fact Sheet for Healthcare Providers: IncredibleEmployment.be  This test is not yet approved or cleared by the [place] FDA and has been authorized for detection and/or diagnosis of SARS-CoV-2 by FDA under an Emergency Use Authorization (EUA). This EUA will remain in effect (meaning this test can be used) for the duration of the COVID-19 declaration under Section 564(b)(1) of the Act, 21 U.S.C. section 360bbb-3(b)(1), unless the authorization is terminated or revoked.  Performed at [facility], [address] [place], [place] [address]   MRSA Next Gen by PCR, Nasal     Status: None   Collection Time: 05/09/21  9:26 AM   Specimen: Nasal Mucosa; Nasal Swab  Result Value Ref Range Status   MRSA by PCR Next Gen NOT DETECTED NOT DETECTED Final    Comment: (NOTE) The GeneXpert MRSA Assay (FDA approved for NASAL specimens only), is one component of a comprehensive MRSA colonization surveillance program. It is not intended to diagnose MRSA infection nor to guide or monitor treatment for MRSA infections. Test performance is not FDA approved in patients less than 67 years old. Performed at [facility], [address] [place], [place] [address]     Coagulation Studies: No results for input(s): LABPROT, INR in the last 72 hours.  Urinalysis: Recent Labs    05/09/21 0205  COLORURINE STRAW*  LABSPEC 1.010  PHURINE 6.0  GLUCOSEU 50*  HGBUR SMALL*  BILIRUBINUR NEGATIVE  KETONESUR NEGATIVE  PROTEINUR 100*   NITRITE NEGATIVE  LEUKOCYTESUR NEGATIVE      Imaging: CT ABDOMEN PELVIS WO CONTRAST  Result Date: 05/08/2021 CLINICAL DATA:  Back pain. EXAM: CT ABDOMEN AND PELVIS WITHOUT CONTRAST TECHNIQUE: Multidetector CT imaging of the abdomen and pelvis was performed following the standard protocol without IV contrast. RADIATION DOSE REDUCTION: This exam was performed according to the departmental dose-optimization program which includes automated exposure control, adjustment of the mA and/or kV according to patient size and/or use of iterative reconstruction technique. COMPARISON:  None. FINDINGS: Lower chest: No acute abnormality. Hepatobiliary: No focal liver abnormality is seen. No gallstones, gallbladder wall thickening, or biliary dilatation. Pancreas: Unremarkable. No pancreatic ductal dilatation or surrounding inflammatory changes. Spleen: Normal in size without focal abnormality. Adrenals/Urinary Tract: Adrenal glands are unremarkable. The kidneys are slightly small in size and lobulated in appearance. There is no evidence of obstructing renal calculi, focal renal lesion, or hydronephrosis. Mild to moderate severity, bilateral, nonspecific perinephric inflammatory fat stranding is seen. Bladder is unremarkable. Stomach/Bowel: Stomach is within normal limits. Appendix appears normal. No evidence of bowel wall thickening,  distention, or inflammatory changes. Noninflamed diverticula are seen within the cecum and sigmoid colon. Vascular/Lymphatic: No significant vascular findings are present. A 16 mm x 13 mm x 15 mm lymph node is seen within the region in between the tail of the pancreas and left kidney. Reproductive: Prostate is unremarkable. Other: A 2.1 cm x 1.8 cm fat containing right inguinal hernia is seen. A similar appearing 3.2 cm x 2.0 cm fat containing left inguinal hernia is noted. No abdominopelvic ascites. Musculoskeletal: No acute or significant osseous findings. IMPRESSION: 1. Bilateral  nonspecific perinephric inflammatory fat stranding. While this may be chronic in nature, correlation with urinalysis is recommended to exclude the presence of an acute pyelonephritis. 2. Sigmoid diverticulosis. Electronically Signed   By: Virgina Norfolk M.D.   On: 05/08/2021 23:24   NM Pulmonary Perfusion  Result Date: 05/09/2021 CLINICAL DATA:  Tachycardia. EXAM: NUCLEAR MEDICINE PERFUSION LUNG SCAN TECHNIQUE: Perfusion images were obtained in multiple projections after intravenous injection of radiopharmaceutical. Ventilation scans intentionally deferred if perfusion scan and chest x-ray adequate for interpretation during COVID 19 epidemic. RADIOPHARMACEUTICALS:  4.31 mCi Tc-42mMAA IV COMPARISON:  Chest radiograph performed on the same date. CT examination dated May 08, 2021 FINDINGS: Prominent cardiac shadow. No segmental filling defect concerning for pulmonary embolism. IMPRESSION: Low probability of pulmonary embolism. Electronically Signed   By: IKeane PoliceD.O.   On: 05/09/2021 14:07   DG Chest Port 1 View  Result Date: 05/09/2021 CLINICAL DATA:  Tachycardia EXAM: PORTABLE CHEST 1 VIEW COMPARISON:  11/12/2020 FINDINGS: Shallow inspiration with low lung volumes. Chronic mild elevation of the right hemidiaphragm. No new consolidation or edema. No pleural effusion. Mild cardiomegaly. IMPRESSION: Shallow inspiration with chronic mild elevation of the right hemidiaphragm. Mild cardiomegaly. Electronically Signed   By: PMacy MisM.D.   On: 05/09/2021 14:05     Medications:    sodium chloride 450 mL (05/09/21 0411)   sodium chloride      atorvastatin  40 mg Oral Daily   calcitRIOL  0.25 mcg Oral Daily   Chlorhexidine Gluconate Cloth  6 each Topical Q0600   epoetin (EPOGEN/PROCRIT) injection  4,000 Units Intravenous Q T,Th,Sa-HD   heparin  5,000 Units Subcutaneous Q8H   hydrALAZINE  25 mg Oral Q8H   labetalol  300 mg Oral BID   lactulose  10 g Oral TID   senna  1 tablet Oral BID    sodium chloride flush  3 mL Intravenous Q12H   sodium chloride, sodium chloride, acetaminophen **OR** acetaminophen, hydrALAZINE, HYDROmorphone (DILAUDID) injection, lactulose, ondansetron (ZOFRAN) IV  Assessment/ Plan:  Mr. JHAKIEM MALIZIAis a 51y.o.  male  with medical problems of Obesity, obstructive sleep apnea, end-stage renal disease on hemodialysis for about a year was admitted on 05/08/2021 for Hyperkalemia [E87.5]   [address]/Home hemodialysis/MTTF/[facility]/ Lt AVF  Severe hyperkalemia in the setting of end-stage renal disease Patient does home hemodialysis but has not been able to perform dialysis since Sunday because of difficulty with cannulation.   Received dialysis early this morning using 1K bath. Potassium corrected to 3.9. Will plan for dialysis tomorrow.   Metabolic acidosis Corrected with dialysis  Anemia of chronic kidney disease Last hemoglobin 8.1 Low dose Epogen with hemodialysis  Severe constipation Likely the cause of abdominal pain Received stool softener. Will order lactulose three times daily. May hold further dosing after 1 bowel movement.     LOS: 0 Hector Brooks 3/9/20233:11 PM

## 2021-05-09 NOTE — Assessment & Plan Note (Signed)
Body mass index is 44.9 kg/m?Marland Kitchen ?Complicates overall care and prognosis.  Recommend lifestyle modifications including physical activity and diet for weight loss and overall long-term health. ? ?

## 2021-05-09 NOTE — ED Notes (Addendum)
Dialysis completed at the bedside by Dialysis RN Kenney Houseman) - Per RN unable to remove fluid from the patient during the session due to the patient becoming hypotensive and symptomatic. Pt received a total of 734m bolus to maintain BP during the session from previous RN & Dialysis RN. Pt to receive PO BP medications.  ?

## 2021-05-09 NOTE — Assessment & Plan Note (Addendum)
Presented with generalized abdominal and bilateral flank pain progressive over about 2 weeks.  CT abdomen/pelvis did show some perinephric fat stranding but urinalysis was negative for infection.  Pt reported no BM x 2 weeks, severe constipation likely cause. ? ?3/10: Resolved after large BM with bowel regimen ?

## 2021-05-09 NOTE — ED Notes (Signed)
Informed consent at bedside and in chart ?

## 2021-05-10 ENCOUNTER — Inpatient Hospital Stay (HOSPITAL_COMMUNITY): Payer: Self-pay

## 2021-05-10 ENCOUNTER — Other Ambulatory Visit (HOSPITAL_COMMUNITY): Payer: Self-pay

## 2021-05-10 DIAGNOSIS — R7881 Bacteremia: Secondary | ICD-10-CM | POA: Diagnosis present

## 2021-05-10 LAB — CBC
HCT: 26.7 % — ABNORMAL LOW (ref 39.0–52.0)
Hemoglobin: 8.3 g/dL — ABNORMAL LOW (ref 13.0–17.0)
MCH: 28.2 pg (ref 26.0–34.0)
MCHC: 31.1 g/dL (ref 30.0–36.0)
MCV: 90.8 fL (ref 80.0–100.0)
Platelets: 205 10*3/uL (ref 150–400)
RBC: 2.94 MIL/uL — ABNORMAL LOW (ref 4.22–5.81)
RDW: 14.2 % (ref 11.5–15.5)
WBC: 8.2 10*3/uL (ref 4.0–10.5)
nRBC: 0 % (ref 0.0–0.2)

## 2021-05-10 LAB — BLOOD CULTURE ID PANEL (REFLEXED) - BCID2

## 2021-05-10 LAB — BASIC METABOLIC PANEL
Anion gap: 11 (ref 5–15)
BUN: 86 mg/dL — ABNORMAL HIGH (ref 6–20)
CO2: 25 mmol/L (ref 22–32)
Calcium: 8.5 mg/dL — ABNORMAL LOW (ref 8.9–10.3)
Chloride: 101 mmol/L (ref 98–111)
Creatinine, Ser: 13.08 mg/dL — ABNORMAL HIGH (ref 0.61–1.24)
GFR, Estimated: 4 mL/min — ABNORMAL LOW (ref >60)
Glucose, Bld: 95 mg/dL (ref 70–99)
Potassium: 5.1 mmol/L (ref 3.5–5.1)
Sodium: 137 mmol/L (ref 135–145)

## 2021-05-10 MED ORDER — AMLODIPINE BESYLATE 10 MG PO TABS: 10.0000 mg | ORAL_TABLET | Freq: Every day | ORAL | Status: DC

## 2021-05-10 MED ORDER — VANCOMYCIN HCL 2000 MG/400ML IV SOLN
2000.0000 mg | Freq: Once | INTRAVENOUS | Status: DC
  Administered 2021-05-10: 2000 mg via INTRAVENOUS
  Filled 2021-05-10: qty 400

## 2021-05-10 MED ORDER — VANCOMYCIN HCL 2000 MG/400ML IV SOLN
2000.0000 mg | Freq: Once | INTRAVENOUS | Status: DC
  Filled 2021-05-10: qty 400

## 2021-05-10 MED ORDER — CEFAZOLIN SODIUM-DEXTROSE 1-4 GM/50ML-% IV SOLN
1.0000 g | INTRAVENOUS | Status: DC
  Filled 2021-05-10: qty 50

## 2021-05-10 MED ORDER — CEFAZOLIN SODIUM-DEXTROSE 2-4 GM/100ML-% IV SOLN
2.0000 g | Freq: Once | INTRAVENOUS | Status: AC
  Administered 2021-05-11: 2 g via INTRAVENOUS
  Filled 2021-05-10: qty 100

## 2021-05-10 MED ORDER — VANCOMYCIN HCL 500 MG/100ML IV SOLN
500.0000 mg | Freq: Once | INTRAVENOUS | Status: DC
Start: 2021-05-10 — End: 2021-05-10
  Filled 2021-05-10: qty 100

## 2021-05-10 MED ORDER — METOPROLOL TARTRATE 50 MG PO TABS
100.0000 mg | ORAL_TABLET | Freq: Two times a day (BID) | ORAL | Status: DC
  Administered 2021-05-10 – 2021-05-12 (×4): 100 mg via ORAL
  Filled 2021-05-10 (×4): qty 2

## 2021-05-10 MED ORDER — VANCOMYCIN HCL IN DEXTROSE 1-5 GM/200ML-% IV SOLN
1000.0000 mg | INTRAVENOUS | Status: DC | PRN
  Filled 2021-05-10: qty 200

## 2021-05-10 MED ORDER — EPOETIN ALFA 4000 UNIT/ML IJ SOLN
INTRAMUSCULAR | Status: AC
  Filled 2021-05-10: qty 1

## 2021-05-10 NOTE — Progress Notes (Signed)
Per patient he had large formed bowel movement overnight. Patient refuses lactulose and senna at this time.  ?

## 2021-05-10 NOTE — Progress Notes (Signed)
?Progress Note ? ? ?Patient: Hector Brooks OJJ:[id number] DOB: [birth year] DOA: 05/08/2021     1 ?DOS: the patient was seen and examined on 05/10/2021 ?  ?Brief hospital course: ?HPI on admission: "Hector Brooks is a pleasant 51 y.o. male with medical history significant for ESRD, hypertension, sleep apnea not on CPAP, and history of DVT, now presenting to the emergency department for evaluation of pain in his flanks and abdomen.  Patient reports approximately 2 weeks of progressive pain in the bilateral flanks and more recently involving the abdomen as well.  He still urinates but denies any dysuria, and denies any fever or chills.  He does hemodialysis at home but was having difficulty with this due to his pain and some difficulty with cannulation, and reports that his last dialysis was on 3/4 or 05/05/2021." ? ?Evaluation in the ED revealed hyperkalemia with K of 7.3. ?CT abdomen/pelvis showed bilateral nonspecific perinephric fat stranding.  Urinalysis was negative for signs of infection.  Admitted to medicine service with nephrology consulted.  Treated with IV calcium, bicarbonate, insulin with dextrose for hyperkalemia and underwent emergent dialysis. ? ?Assessment and Plan: ?* Hyperkalemia ?Presented with K7.3.  Initially treated medically in the ED with IV calcium gluconate, bicarb, insulin and dextrose.  Underwent emergent hemodialysis. ? ?3/10: Potassium level 5.1 this morning. ?-- Continue dialysis per nephrology ?-- Monitor BMP ? ?Abdominal pain ?Presented with generalized abdominal and bilateral flank pain progressive over about 2 weeks.  CT abdomen/pelvis did show some perinephric fat stranding but urinalysis was negative for infection.  Pt reported no BM x 2 weeks, severe constipation likely cause. ? ?3/10: Resolved after large BM with bowel regimen ? ?Positive blood culture ?Blood culture obtained 3/9 as part of infectious evaluation given persistent tachycardia.   ?Cultures growing GPC's today  3/10. ?Start empiric Vancomycin for now. ?Follow cultures & obtain repeats. ? ?Sinus tachycardia ?Patient has persistent heart rate in the 120s today.  He reports elevated resting heart rates at baseline but not this high.  Denies chest discomfort, shortness of breath.  Does have history of blood clots and not on a blood thinner any longer. ?Lower extremity Dopplers revealed a chronic right lower extremity DVT but VQ scan was low probability for PE. ?-- UA negative for infection ?-- Chest x-ray with shallow inspiration and chronically elevated right hemidiaphragm.  No infiltrates or opacity to suggest pneumonia and no respiratory symptoms ?-- Blood cultures obtained and growing GPC's --management as outlined ?-- Continue telemetry monitoring ?-- Stop hydralazine, may resume if HR remains elevated ? ?ESRD on dialysis [facility]) ?Patient is on home hemodialysis. ?Required emergent dialysis on admission for K7.3. ?--Nephrology following ?-- Dialysis per nephrology ?-- Monitor electrolytes and hemodynamics ? ?K today 5.1.  Undergoing dialysis. ? ?Constipation ?Present on admission, patient reporting no BM in about 2 weeks.  Typically goes regularly 1-2 times daily.  This could explain his abdominal and flank pain potentially. ? ?Resolved with bowel regimen including lactulose, senna, Dulcolax if needed ? ?Hypertension ?Patient was hypotensive with dialysis and required IV fluids for BP support.  Since then, blood pressure has been rather labile as high as 178/108, as low as 137/89. ?-- Stop hydralazine given persistent tachycardia ?-- Home labetalol was changed to metoprolol 100 mg twice daily ?--If additional agent needed for BP control, consider amlodipine ?-- As needed hydralazine ?-- Monitor BP and adjust meds as needed for BP goal 140/90 ? ?Metabolic acidosis ?Due to renal failure.  Resolved after dialysis. ? ?Chronic deep  vein thrombosis (DVT) of right lower extremity ([place]) ?Patient has not been on anticoagulation  prior to admission.  Lower extremity Doppler ultrasound did show a chronic appearing right lower extremity DVT involving the femoral and popliteal veins. ?-- Vascular consulted, question benefit of IVC filter ?--Started on Eliquis ? ?History of DVT of lower extremity ?Patient reported history of prior blood clots, not currently on a blood thinner.  With tachycardia, concern for recurrent VTE. ?--D-dimer in renal failure will be elevated and unhelpful ?-- Lower extremity Doppler ultrasound showed chronic appearing right lower extremity DVT involving the popliteal and femoral veins ?-- VQ scan low probability for PE ?-- Started on Eliquis ?-- Vascular surgery consulted for consideration of IVC filter if indicated ? ?Morbid obesity ([place]) ?Body mass index is 44.9 kg/m?Marland Kitchen ?Complicates overall care and prognosis.  Recommend lifestyle modifications including physical activity and diet for weight loss and overall long-term health. ? ? ? ? ? ?  ? ?Subjective: Patient seen during dialysis this morning.  Did finally having a BM after bowel regimen started yesterday.  Overall he reports feeling better.  Denies fevers or chills.  No chest pain or shortness of breath no nausea vomiting.  His blood pressure is a little low and he says this happens all the time with dialysis. ? ?Physical Exam: ?Vitals:  ? 05/10/21 1115 05/10/21 1131 05/10/21 1142 05/10/21 1214  ?BP: (!) 80/56 (!) 85/57  114/71  ?Pulse: (!) 128 (!) 120 (!) 123 (!) 118  ?Resp: 19 (!) 27 (!) 29 16  ?Temp:  99.6 ?F (37.6 ?C)  99.5 ?F (37.5 ?C)  ?TempSrc:  Oral  Oral  ?SpO2:  100%  97%  ?Weight:  135.1 kg    ? ?General exam: awake, alert, no acute distress, morbidly obese ?HEENT: atraumatic, clear conjunctiva, anicteric sclera, moist mucus membranes, hearing grossly normal  ?Respiratory system: CTAB, no wheezes, rales or rhonchi, normal respiratory effort. ?Cardiovascular system: normal S1/S2, RRR.   ?Gastrointestinal system: soft, NT, ND, no HSM felt, +bowel  sounds. ?Central nervous system: A&O x3. no gross focal neurologic deficits, normal speech ?Extremities: Left upper extremity fistula accessed for dialysis, no edema, normal tone ?Skin: dry, intact, normal temperature ?Psychiatry: normal mood, congruent affect, judgement and insight appear normal ? ? ?Data Reviewed: ? ?Labs reviewed and notable for blood cultures growing GPC's. BMP with BUN 86, creatinine 13.08, calcium 8.5, GFR 4.  CBC with hemoglobin stable 8.3 ? ?Family Communication: None ? ?Disposition: ?Status is: Inpatient ?Remains inpatient appropriate because: Severity of illness with persistent tachycardia and now positive blood culture started on IV antibiotics pending further evaluation. ? ? Planned Discharge Destination: Home ? ? ? ?Time spent: 40 minutes ? ?Author: ?Ezekiel Slocumb, DO ?05/10/2021 1:59 PM ? ?For on call review [email or web address].  ?

## 2021-05-10 NOTE — Assessment & Plan Note (Addendum)
Blood culture obtained 3/9 as part of infectious evaluation given persistent tachycardia - grew Staph epi, most likely contaminant. ?Repeat blood cultures negative x2 days so far. ?Patient has no signs or symptoms of any infection ?--Stop antibiotics, not indicated ? ?

## 2021-05-10 NOTE — Consult Note (Addendum)
Pharmacy Antibiotic Note ? ?Hector Brooks is a 51 y.o. male with ESRD on home dialysis [facility], HTN, OSA not on CPAP, history of DVT admitted on 05/08/2021 with  back pain and abdominal pain .  Pharmacy has been consulted for vancomycin dosing.  ? ?New low grade fever 3/10, single-set blood culture drawn. 1/2 bottles with GPC so far. Awaiting BCID result. Patient has been in persistent ST. No leukocytosis, no left shift on last differential ? ?Patient was found to have chronic appearing RLE DVT and was started on anticoagulation. VQ scan with low probability of PE ? ?Plan: ? ?Vancomycin 2.5 g IV LD followed by vancomycin 1 g IV every HD session ?--Will continue to follow-up for levels as indicated ? ?Weight: 135.1 kg (297 lb 13.5 oz) ? ?Temp (24hrs), Avg:98.9 ?F (37.2 ?C), Min:98.2 ?F (36.8 ?C), Max:99.6 ?F (37.6 ?C) ? ?Recent Labs  ?Lab 05/08/21 ?2257 05/09/21 ?0559 05/10/21 ?0453  ?WBC 6.6 6.7 8.2  ?CREATININE 17.89* 8.37* 13.08*  ?  ?Estimated Creatinine Clearance: 9.3 mL/min (A) (by C-G formula based on SCr of 13.08 mg/dL (H)).   ? ?No Known Allergies ? ?Antimicrobials this admission: ?Vancomycin 3/10 >>  ? ?Dose adjustments this admission: ?N/A ? ?Microbiology results: ?3/9 Bcx (single-set): 1/2 bottles GPC, BCID result pending ?3/9 MRSA PCR: (-) ? ?Thank you for allowing pharmacy to be a part of this patient?s care. ? ?Benita Gutter ?05/10/2021 2:13 PM ? ?

## 2021-05-10 NOTE — Progress Notes (Signed)
?  Hemodialysis Post Treatment Note ? ?Tx Date: 05/10/2021 ?Tx Time: 3 hours ?UF Removed: 1841 ml ?Note: ?Patient completed 3 hours of treatment. Hypotensive and tachycardic towards end of treatment, UF held, NS bolus given.  Nephology team in suite, and aware. Patient offers up no concerns. Left forearm fistula functions wells, maintains prescribed blood flow, minimal post treatment bleeding. Estimate dry weight 135.1kg ? ?

## 2021-05-10 NOTE — Progress Notes (Signed)
[facility]  ROUNDING NOTE   Subjective:   Patient seen and evaluated during dialysis   HEMODIALYSIS FLOWSHEET:  Blood Flow Rate (mL/min): 400 mL/min Arterial Pressure (mmHg): -150 mmHg Venous Pressure (mmHg): 210 mmHg Transmembrane Pressure (mmHg): 80 mmHg Ultrafiltration Rate (mL/min): 0 mL/min Dialysate Flow Rate (mL/min): 600 ml/min Conductivity: Machine : 13.6 Conductivity: Machine : 13.6 Dialysis Fluid Bolus: Normal Saline Bolus Amount (mL): 400 mL (Pt given NS per CN, d/t low bp)  No complaints at this time  Objective:  Vital signs in last 24 hours:  Temp:  [98.2 F (36.8 C)-99.6 F (37.6 C)] 99.6 F (37.6 C) (03/10 1131) Pulse Rate:  [98-207] 118 (03/10 1214) Resp:  [16-29] 16 (03/10 1214) BP: (80-150)/(56-110) 114/71 (03/10 1214) SpO2:  [91 %-100 %] 97 % (03/10 1214) Weight:  [135.1 kg-137.6 kg] 135.1 kg (03/10 1131)  Weight change: -1.2 kg Filed Weights   05/10/21 0500 05/10/21 0831 05/10/21 1131  Weight: (!) 136.7 kg (!) 137.6 kg 135.1 kg    Intake/Output: I/O last 3 completed shifts: In: -  Out: 782 [Urine:200; Other:582]   Intake/Output this shift:  Total I/O In: -  Out: 1841 [Other:1841]  Physical Exam: General: NAD  Head: Normocephalic, atraumatic. Moist oral mucosal membranes  Eyes: Anicteric  Lungs:  Clear to auscultation, normal effort  Heart: Regular rate with tachycardia   Abdomen:  Soft, nontender  Extremities:  No peripheral edema.  Neurologic: Nonfocal, moving all four extremities  Skin: No lesions  Access: Lt AVF    Basic Metabolic Panel: Recent Labs  Lab 05/08/21 2257 05/09/21 0205 05/09/21 0559 05/10/21 0453  NA 138  --  136 137  K 7.3* 7.0* 3.9 5.1  CL 112*  --  98 101  CO2 16*  --  23 25  GLUCOSE 93  --  82 95  BUN 132*  --  68* 86*  CREATININE 17.89*  --  8.37* 13.08*  CALCIUM 9.0  --  8.3* 8.5*  PHOS  --  7.2*  --   --      Liver Function Tests: Recent Labs  Lab 05/08/21 2257  AST 10*   ALT 8  ALKPHOS 38  BILITOT 0.9  PROT 8.0  ALBUMIN 3.5    Recent Labs  Lab 05/08/21 2257  LIPASE 39    No results for input(s): AMMONIA in the last 168 hours.  CBC: Recent Labs  Lab 05/08/21 2257 05/09/21 0559 05/10/21 0453  WBC 6.6 6.7 8.2  NEUTROABS 4.8  --   --   HGB 8.7* 8.1* 8.3*  HCT 29.4* 26.8* 26.7*  MCV 95.5 95.4 90.8  PLT 204 157 205     Cardiac Enzymes: No results for input(s): CKTOTAL, CKMB, CKMBINDEX, TROPONINI in the last 168 hours.  BNP: Invalid input(s): POCBNP  CBG: No results for input(s): GLUCAP in the last 168 hours.  Microbiology: Results for orders placed or performed during the hospital encounter of 05/08/21  Resp Panel by RT-PCR (Flu A&B, Covid) Nasopharyngeal Swab     Status: None   Collection Time: 05/09/21  3:44 AM   Specimen: Nasopharyngeal Swab; Nasopharyngeal(NP) swabs in vial transport medium  Result Value Ref Range Status   SARS Coronavirus 2 by RT PCR NEGATIVE NEGATIVE Final    Comment: (NOTE) SARS-CoV-2 target nucleic acids are NOT DETECTED.  The SARS-CoV-2 RNA is generally detectable in upper respiratory specimens during the acute phase of infection. The lowest concentration of SARS-CoV-2 viral copies this assay can detect is 138 copies/mL. A  negative result does not preclude SARS-Cov-2 infection and should not be used as the sole basis for treatment or other patient management decisions. A negative result may occur with  improper specimen collection/handling, submission of specimen other than nasopharyngeal swab, presence of viral mutation(s) within the areas targeted by this assay, and inadequate number of viral copies(<138 copies/mL). A negative result must be combined with clinical observations, patient history, and epidemiological information. The expected result is Negative.  Fact Sheet for Patients:  EntrepreneurPulse.com.au  Fact Sheet for Healthcare Providers:   IncredibleEmployment.be  This test is no t yet approved or cleared by the [place] FDA and  has been authorized for detection and/or diagnosis of SARS-CoV-2 by FDA under an Emergency Use Authorization (EUA). This EUA will remain  in effect (meaning this test can be used) for the duration of the COVID-19 declaration under Section 564(b)(1) of the Act, 21 U.S.C.section 360bbb-3(b)(1), unless the authorization is terminated  or revoked sooner.       Influenza A by PCR NEGATIVE NEGATIVE Final   Influenza B by PCR NEGATIVE NEGATIVE Final    Comment: (NOTE) The Xpert Xpress SARS-CoV-2/FLU/RSV plus assay is intended as an aid in the diagnosis of influenza from Nasopharyngeal swab specimens and should not be used as a sole basis for treatment. Nasal washings and aspirates are unacceptable for Xpert Xpress SARS-CoV-2/FLU/RSV testing.  Fact Sheet for Patients: EntrepreneurPulse.com.au  Fact Sheet for Healthcare Providers: IncredibleEmployment.be  This test is not yet approved or cleared by the [place] FDA and has been authorized for detection and/or diagnosis of SARS-CoV-2 by FDA under an Emergency Use Authorization (EUA). This EUA will remain in effect (meaning this test can be used) for the duration of the COVID-19 declaration under Section 564(b)(1) of the Act, 21 U.S.C. section 360bbb-3(b)(1), unless the authorization is terminated or revoked.  Performed at [facility], [address] [place], [place] [address]   MRSA Next Gen by PCR, Nasal     Status: None   Collection Time: 05/09/21  9:26 AM   Specimen: Nasal Mucosa; Nasal Swab  Result Value Ref Range Status   MRSA by PCR Next Gen NOT DETECTED NOT DETECTED Final    Comment: (NOTE) The GeneXpert MRSA Assay (FDA approved for NASAL specimens only), is one component of a comprehensive MRSA colonization surveillance program. It is not intended to  diagnose MRSA infection nor to guide or monitor treatment for MRSA infections. Test performance is not FDA approved in patients less than 82 years old. Performed at [facility], [address] [place], [place] [address]   Culture, blood (single) w Reflex to ID Panel     Status: None (Preliminary result)   Collection Time: 05/09/21  8:01 PM   Specimen: BLOOD  Result Value Ref Range Status   Specimen Description BLOOD RIGHT ANTECUBITAL  Final   Special Requests   Final    BOTTLES DRAWN AEROBIC ONLY Blood Culture adequate volume   Culture   Final    NO GROWTH < 12 HOURS Performed at [facility], [address] [place], [place] [address]    Report Status PENDING  Incomplete    Coagulation Studies: No results for input(s): LABPROT, INR in the last 72 hours.  Urinalysis: Recent Labs    05/09/21 0205  COLORURINE STRAW*  LABSPEC 1.010  PHURINE 6.0  GLUCOSEU 50*  HGBUR SMALL*  BILIRUBINUR NEGATIVE  KETONESUR NEGATIVE  PROTEINUR 100*  NITRITE NEGATIVE  LEUKOCYTESUR NEGATIVE  Imaging: CT ABDOMEN PELVIS WO CONTRAST  Result Date: 05/08/2021 CLINICAL DATA:  Back pain. EXAM: CT ABDOMEN AND PELVIS WITHOUT CONTRAST TECHNIQUE: Multidetector CT imaging of the abdomen and pelvis was performed following the standard protocol without IV contrast. RADIATION DOSE REDUCTION: This exam was performed according to the departmental dose-optimization program which includes automated exposure control, adjustment of the mA and/or kV according to patient size and/or use of iterative reconstruction technique. COMPARISON:  None. FINDINGS: Lower chest: No acute abnormality. Hepatobiliary: No focal liver abnormality is seen. No gallstones, gallbladder wall thickening, or biliary dilatation. Pancreas: Unremarkable. No pancreatic ductal dilatation or surrounding inflammatory changes. Spleen: Normal in size without focal abnormality. Adrenals/Urinary Tract: Adrenal glands are  unremarkable. The kidneys are slightly small in size and lobulated in appearance. There is no evidence of obstructing renal calculi, focal renal lesion, or hydronephrosis. Mild to moderate severity, bilateral, nonspecific perinephric inflammatory fat stranding is seen. Bladder is unremarkable. Stomach/Bowel: Stomach is within normal limits. Appendix appears normal. No evidence of bowel wall thickening, distention, or inflammatory changes. Noninflamed diverticula are seen within the cecum and sigmoid colon. Vascular/Lymphatic: No significant vascular findings are present. A 16 mm x 13 mm x 15 mm lymph node is seen within the region in between the tail of the pancreas and left kidney. Reproductive: Prostate is unremarkable. Other: A 2.1 cm x 1.8 cm fat containing right inguinal hernia is seen. A similar appearing 3.2 cm x 2.0 cm fat containing left inguinal hernia is noted. No abdominopelvic ascites. Musculoskeletal: No acute or significant osseous findings. IMPRESSION: 1. Bilateral nonspecific perinephric inflammatory fat stranding. While this may be chronic in nature, correlation with urinalysis is recommended to exclude the presence of an acute pyelonephritis. 2. Sigmoid diverticulosis. Electronically Signed   By: Virgina Norfolk M.D.   On: 05/08/2021 23:24   NM Pulmonary Perfusion  Result Date: 05/09/2021 CLINICAL DATA:  Tachycardia. EXAM: NUCLEAR MEDICINE PERFUSION LUNG SCAN TECHNIQUE: Perfusion images were obtained in multiple projections after intravenous injection of radiopharmaceutical. Ventilation scans intentionally deferred if perfusion scan and chest x-ray adequate for interpretation during COVID 19 epidemic. RADIOPHARMACEUTICALS:  4.31 mCi Tc-32mMAA IV COMPARISON:  Chest radiograph performed on the same date. CT examination dated May 08, 2021 FINDINGS: Prominent cardiac shadow. No segmental filling defect concerning for pulmonary embolism. IMPRESSION: Low probability of pulmonary embolism.  Electronically Signed   By: IKeane PoliceD.O.   On: 05/09/2021 14:07   UKoreaVenous Img Lower Bilateral (DVT)  Result Date: 05/09/2021 CLINICAL DATA:  History of DVT.  Cramping. EXAM: RIGHT LOWER EXTREMITY VENOUS DOPPLER ULTRASOUND TECHNIQUE: Gray-scale sonography with compression, as well as color and duplex ultrasound, were performed to evaluate the deep venous system(s) from the level of the common femoral vein through the popliteal and proximal calf veins. COMPARISON:  Chest XR, 05/09/2021. CT AP, 05/08/2021. RIGHT lower extremity venous duplex, 05/28/2016 (NEGATIVE). FINDINGS: VENOUS Normal compressibility of the RIGHT common femoral vein. Incomplete compressibility of the RIGHT femoral and adjacent portion of the popliteal vein, with eccentric heterogeneously hyperechoic intraluminal lesion. See key image. Visualized portions of profunda femoral vein and great saphenous vein unremarkable. Limited views of the contralateral common femoral vein are unremarkable. OTHER No evidence of superficial thrombophlebitis or abnormal fluid collection. 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 These results will be called to the ordering clinician or representative by the Radiologist Assistant, and communication documented in the PACS or [employer] JMichaelle Birks  MD [facility] Radiology Electronically Signed   By: Michaelle Birks M.D.   On: 05/09/2021 15:13   DG Chest Port 1 View  Result Date: 05/09/2021 CLINICAL DATA:  Tachycardia EXAM: PORTABLE CHEST 1 VIEW COMPARISON:  11/12/2020 FINDINGS: Shallow inspiration with low lung volumes. Chronic mild elevation of the right hemidiaphragm. No new consolidation or edema. No pleural effusion. Mild cardiomegaly. IMPRESSION: Shallow inspiration with chronic mild elevation of the right hemidiaphragm. Mild cardiomegaly.  Electronically Signed   By: Macy Mis M.D.   On: 05/09/2021 14:05     Medications:      amLODipine  10 mg Oral Daily   apixaban  10 mg Oral BID   Followed by   Derrill Memo ON 05/16/2021] apixaban  5 mg Oral BID   atorvastatin  40 mg Oral Daily   calcitRIOL  0.25 mcg Oral Daily   calcium acetate  1,334 mg Oral TID with meals   Chlorhexidine Gluconate Cloth  6 each Topical Q0600   epoetin (EPOGEN/PROCRIT) injection  4,000 Units Intravenous Q T,Th,Sa-HD   lactulose  10 g Oral TID   metoprolol tartrate  100 mg Oral BID   senna  1 tablet Oral BID   sodium chloride flush  3 mL Intravenous Q12H   acetaminophen **OR** acetaminophen, bisacodyl, cyclobenzaprine, hydrALAZINE, HYDROmorphone (DILAUDID) injection, lactulose, metoprolol tartrate, ondansetron (ZOFRAN) IV  Assessment/ Plan:  Mr. Hector Brooks is a 51 y.o.  male  with medical problems of Obesity, obstructive sleep apnea, end-stage renal disease on hemodialysis for about a year was admitted on 05/08/2021 for Hyperkalemia [E87.5] Tachycardia [R00.0] History of DVT (deep vein thrombosis) [Z86.718] Leg cramping [R25.2] Low back pain, unspecified back pain laterality, unspecified chronicity, unspecified whether sciatica present [M54.50]   [address]/Home hemodialysis/MTTF/[facility] [place]/ Lt AVF  Severe hyperkalemia in the setting of end-stage renal disease Patient does home hemodialysis but has not been able to perform dialysis since Sunday because of difficulty with cannulation.   Receiving dialysis with UF goal 3L as tolerated. Increased heart rate with hypotension treated with decreased UF, 1.8L achieved, and NS bolus 493m. Request adjusted dry weight of 135.5kg.   Metabolic acidosis Corrected with dialysis  Anemia of chronic kidney disease Last hemoglobin 8.3 Low dose Epogen with hemodialysis  Severe constipation Likely the cause of abdominal pain Lactulose prescribed yesterday with large BM recorded.      LOS: 1 Deiondre Harrower 3/10/202312:18 PM

## 2021-05-10 NOTE — TOC Benefit Eligibility Note (Signed)
Patient Advocate Encounter ?  ?Insurance verification completed.   ?  ?The patient is currently admitted and upon discharge could be taking ELIQUIS. ?  ?The current 30 day co-pay is, $60.  ? ?The patient is insured through [employer]. ? ? ?  ? ? ?

## 2021-05-10 NOTE — Progress Notes (Addendum)
PHARMACY - PHYSICIAN COMMUNICATION ?CRITICAL VALUE ALERT - BLOOD CULTURE IDENTIFICATION (BCID) ? ?Hector Brooks is an 51 y.o. male who presented to [facility] on 05/08/2021 with a chief complaint of abdominal pain and flank pain.   ? ?Assessment:  Blood culture (single bottle) from 3/9 with GPC, BCID detected MSSE.  Per notes difficulty with cannulation of HD access with home HD.   ? ?Name of physician (or Provider) Contacted: Dr Arbutus Ped ? ?Current antibiotics: Vancomycin ? ?Changes to prescribed antibiotics recommended:  ?Recommendations accepted by provider ?Change to cefazolin and repeat blood culture ?Patient received vancomycin 2gm after HD today so will start cefazolin tomorrow, 3/11 ? ?Results for orders placed or performed during the hospital encounter of 05/08/21  ?Blood Culture ID Panel (Reflexed) (Collected: 05/09/2021  8:01 PM)  ?Result Value Ref Range  ? Enterococcus faecalis NOT DETECTED NOT DETECTED  ? Enterococcus Faecium NOT DETECTED NOT DETECTED  ? Listeria monocytogenes NOT DETECTED NOT DETECTED  ? Staphylococcus species DETECTED (A) NOT DETECTED  ? Staphylococcus aureus (BCID) NOT DETECTED NOT DETECTED  ? Staphylococcus epidermidis DETECTED (A) NOT DETECTED  ? Staphylococcus lugdunensis NOT DETECTED NOT DETECTED  ? Streptococcus species NOT DETECTED NOT DETECTED  ? Streptococcus agalactiae NOT DETECTED NOT DETECTED  ? Streptococcus pneumoniae NOT DETECTED NOT DETECTED  ? Streptococcus pyogenes NOT DETECTED NOT DETECTED  ? A.calcoaceticus-baumannii NOT DETECTED NOT DETECTED  ? Bacteroides fragilis NOT DETECTED NOT DETECTED  ? Enterobacterales NOT DETECTED NOT DETECTED  ? Enterobacter cloacae complex NOT DETECTED NOT DETECTED  ? Escherichia coli NOT DETECTED NOT DETECTED  ? Klebsiella aerogenes NOT DETECTED NOT DETECTED  ? Klebsiella oxytoca NOT DETECTED NOT DETECTED  ? Klebsiella pneumoniae NOT DETECTED NOT DETECTED  ? Proteus species NOT DETECTED NOT DETECTED  ? Salmonella species NOT DETECTED  NOT DETECTED  ? Serratia marcescens NOT DETECTED NOT DETECTED  ? Haemophilus influenzae NOT DETECTED NOT DETECTED  ? Neisseria meningitidis NOT DETECTED NOT DETECTED  ? Pseudomonas aeruginosa NOT DETECTED NOT DETECTED  ? Stenotrophomonas maltophilia NOT DETECTED NOT DETECTED  ? Candida albicans NOT DETECTED NOT DETECTED  ? Candida auris NOT DETECTED NOT DETECTED  ? Candida glabrata NOT DETECTED NOT DETECTED  ? Candida krusei NOT DETECTED NOT DETECTED  ? Candida parapsilosis NOT DETECTED NOT DETECTED  ? Candida tropicalis NOT DETECTED NOT DETECTED  ? Cryptococcus neoformans/gattii NOT DETECTED NOT DETECTED  ? Methicillin resistance mecA/C NOT DETECTED NOT DETECTED  ? ?Doreene Eland, PharmD, BCPS, BCIDP ?Work Cell: [phone] ?05/10/2021 4:25 PM ? ? ? ?

## 2021-05-11 LAB — BASIC METABOLIC PANEL
Anion gap: 12 (ref 5–15)
BUN: 58 mg/dL — ABNORMAL HIGH (ref 6–20)
CO2: 27 mmol/L (ref 22–32)
Calcium: 8.3 mg/dL — ABNORMAL LOW (ref 8.9–10.3)
Chloride: 96 mmol/L — ABNORMAL LOW (ref 98–111)
Creatinine, Ser: 10.65 mg/dL — ABNORMAL HIGH (ref 0.61–1.24)
GFR, Estimated: 5 mL/min — ABNORMAL LOW (ref >60)
Glucose, Bld: 98 mg/dL (ref 70–99)
Potassium: 4.9 mmol/L (ref 3.5–5.1)
Sodium: 135 mmol/L (ref 135–145)

## 2021-05-11 LAB — CBC
HCT: 27.4 % — ABNORMAL LOW (ref 39.0–52.0)
Hemoglobin: 8.4 g/dL — ABNORMAL LOW (ref 13.0–17.0)
MCH: 28.5 pg (ref 26.0–34.0)
MCHC: 30.7 g/dL (ref 30.0–36.0)
MCV: 92.9 fL (ref 80.0–100.0)
Platelets: 179 10*3/uL (ref 150–400)
RBC: 2.95 MIL/uL — ABNORMAL LOW (ref 4.22–5.81)
RDW: 14.3 % (ref 11.5–15.5)
WBC: 10.1 10*3/uL (ref 4.0–10.5)
nRBC: 0 % (ref 0.0–0.2)

## 2021-05-11 NOTE — Progress Notes (Addendum)
[facility]  ROUNDING NOTE   Subjective:   Patient sitting on a chair at the bedside,appears in no acute distress. He denies SOB, nausea and vomiting,' feeling better' Reports good appetite.   Objective:  Vital signs in last 24 hours:  Temp:  [98.5 F (36.9 C)-99.9 F (37.7 C)] 98.5 F (36.9 C) (03/11 1157) Pulse Rate:  [104-119] 105 (03/11 1157) Resp:  [16-19] 17 (03/11 1157) BP: (103-123)/(63-80) 113/63 (03/11 1157) SpO2:  [93 %-97 %] 93 % (03/11 1157) Weight:  [135.3 kg] 135.3 kg (03/11 0500)  Weight change: 0.9 kg Filed Weights   05/10/21 0831 05/10/21 1131 05/11/21 0500  Weight: (!) 137.6 kg 135.1 kg 135.3 kg    Intake/Output: I/O last 3 completed shifts: In: 402.2 [IV Piggyback:402.2] Out: 1062 [Other:1841]   Intake/Output this shift:  Total I/O In: 360 [P.O.:360] Out: -   Physical Exam: General: Appears pleasant and comfortable  Head: Moist oral mucosal membranes  Eyes: Anicteric  Lungs:  Respirations symmetrical,normal effort,lungs clear  Heart: Regular rate and rhythm  Abdomen:  Non distended, non tender  Extremities:  Trace peripheral edema.  Neurologic: Awake,alert,oriented  Skin: No acute lesions or rashes  Access: Lt AVF    Basic Metabolic Panel: Recent Labs  Lab 05/08/21 2257 05/09/21 0205 05/09/21 0559 05/10/21 0453 05/11/21 0434  NA 138  --  136 137 135  K 7.3* 7.0* 3.9 5.1 4.9  CL 112*  --  98 101 96*  CO2 16*  --  '23 25 27  '$ GLUCOSE 93  --  82 95 98  BUN 132*  --  68* 86* 58*  CREATININE 17.89*  --  8.37* 13.08* 10.65*  CALCIUM 9.0  --  8.3* 8.5* 8.3*  PHOS  --  7.2*  --   --   --      Liver Function Tests: Recent Labs  Lab 05/08/21 2257  AST 10*  ALT 8  ALKPHOS 38  BILITOT 0.9  PROT 8.0  ALBUMIN 3.5    Recent Labs  Lab 05/08/21 2257  LIPASE 39    No results for input(s): AMMONIA in the last 168 hours.  CBC: Recent Labs  Lab 05/08/21 2257 05/09/21 0559 05/10/21 0453 05/11/21 0434  WBC 6.6 6.7  8.2 10.1  NEUTROABS 4.8  --   --   --   HGB 8.7* 8.1* 8.3* 8.4*  HCT 29.4* 26.8* 26.7* 27.4*  MCV 95.5 95.4 90.8 92.9  PLT 204 157 205 179     Cardiac Enzymes: No results for input(s): CKTOTAL, CKMB, CKMBINDEX, TROPONINI in the last 168 hours.  BNP: Invalid input(s): POCBNP  CBG: No results for input(s): GLUCAP in the last 168 hours.  Microbiology: Results for orders placed or performed during the hospital encounter of 05/08/21  Resp Panel by RT-PCR (Flu A&B, Covid) Nasopharyngeal Swab     Status: None   Collection Time: 05/09/21  3:44 AM   Specimen: Nasopharyngeal Swab; Nasopharyngeal(NP) swabs in vial transport medium  Result Value Ref Range Status   SARS Coronavirus 2 by RT PCR NEGATIVE NEGATIVE Final    Comment: (NOTE) SARS-CoV-2 target nucleic acids are NOT DETECTED.  The SARS-CoV-2 RNA is generally detectable in upper respiratory specimens during the acute phase of infection. The lowest concentration of SARS-CoV-2 viral copies this assay can detect is 138 copies/mL. A negative result does not preclude SARS-Cov-2 infection and should not be used as the sole basis for treatment or other patient management decisions. A negative result may occur with  improper specimen collection/handling, submission of specimen other than nasopharyngeal swab, presence of viral mutation(s) within the areas targeted by this assay, and inadequate number of viral copies(<138 copies/mL). A negative result must be combined with clinical observations, patient history, and epidemiological information. The expected result is Negative.  Fact Sheet for Patients:  EntrepreneurPulse.com.au  Fact Sheet for Healthcare Providers:  IncredibleEmployment.be  This test is no t yet approved or cleared by the [place] FDA and  has been authorized for detection and/or diagnosis of SARS-CoV-2 by FDA under an Emergency Use Authorization (EUA). This EUA will remain   in effect (meaning this test can be used) for the duration of the COVID-19 declaration under Section 564(b)(1) of the Act, 21 U.S.C.section 360bbb-3(b)(1), unless the authorization is terminated  or revoked sooner.       Influenza A by PCR NEGATIVE NEGATIVE Final   Influenza B by PCR NEGATIVE NEGATIVE Final    Comment: (NOTE) The Xpert Xpress SARS-CoV-2/FLU/RSV plus assay is intended as an aid in the diagnosis of influenza from Nasopharyngeal swab specimens and should not be used as a sole basis for treatment. Nasal washings and aspirates are unacceptable for Xpert Xpress SARS-CoV-2/FLU/RSV testing.  Fact Sheet for Patients: EntrepreneurPulse.com.au  Fact Sheet for Healthcare Providers: IncredibleEmployment.be  This test is not yet approved or cleared by the [place] FDA and has been authorized for detection and/or diagnosis of SARS-CoV-2 by FDA under an Emergency Use Authorization (EUA). This EUA will remain in effect (meaning this test can be used) for the duration of the COVID-19 declaration under Section 564(b)(1) of the Act, 21 U.S.C. section 360bbb-3(b)(1), unless the authorization is terminated or revoked.  Performed at [facility], [address] [place], [place] [address]   MRSA Next Gen by PCR, Nasal     Status: None   Collection Time: 05/09/21  9:26 AM   Specimen: Nasal Mucosa; Nasal Swab  Result Value Ref Range Status   MRSA by PCR Next Gen NOT DETECTED NOT DETECTED Final    Comment: (NOTE) The GeneXpert MRSA Assay (FDA approved for NASAL specimens only), is one component of a comprehensive MRSA colonization surveillance program. It is not intended to diagnose MRSA infection nor to guide or monitor treatment for MRSA infections. Test performance is not FDA approved in patients less than 60 years old. Performed at [facility], [address] [place], [place] [address]   Culture, blood (single)  w Reflex to ID Panel     Status: None (Preliminary result)   Collection Time: 05/09/21  8:01 PM   Specimen: BLOOD  Result Value Ref Range Status   Specimen Description   Final    BLOOD RIGHT ANTECUBITAL Performed at [facility], [address], [place], [place] [address]    Special Requests   Final    BOTTLES DRAWN AEROBIC ONLY Blood Culture adequate volume Performed at [facility], [address] [place], [place] [address]    Culture  Setup Time   Final    Organism ID to follow GRAM POSITIVE COCCI AEROBIC BOTTLE ONLY CRITICAL RESULT CALLED TO, READ BACK BY AND VERIFIED WITH: Nilsa Nutting 05/10/21 1434 SLM    Culture   Final    GRAM POSITIVE COCCI TOO YOUNG TO READ Performed at [facility], [place] [address], [place], [place] [address]    Report Status PENDING  Incomplete  Blood Culture ID Panel (Reflexed)     Status: Abnormal   Collection Time: 05/09/21  8:01  PM  Result Value Ref Range Status   Enterococcus faecalis NOT DETECTED NOT DETECTED Final   Enterococcus Faecium NOT DETECTED NOT DETECTED Final   Listeria monocytogenes NOT DETECTED NOT DETECTED Final   Staphylococcus species DETECTED (A) NOT DETECTED Final    Comment: CRITICAL RESULT CALLED TO, READ BACK BY AND VERIFIED WITH: ANDERSON MOORE 05/10/21 1434 SLM    Staphylococcus aureus (BCID) NOT DETECTED NOT DETECTED Final   Staphylococcus epidermidis DETECTED (A) NOT DETECTED Final    Comment: CRITICAL RESULT CALLED TO, READ BACK BY AND VERIFIED WITH: Nilsa Nutting 05/10/21 1434 SLM    Staphylococcus lugdunensis NOT DETECTED NOT DETECTED Final   Streptococcus species NOT DETECTED NOT DETECTED Final   Streptococcus agalactiae NOT DETECTED NOT DETECTED Final   Streptococcus pneumoniae NOT DETECTED NOT DETECTED Final   Streptococcus pyogenes NOT DETECTED NOT DETECTED Final   A.calcoaceticus-baumannii NOT DETECTED NOT DETECTED Final   Bacteroides fragilis NOT DETECTED NOT DETECTED Final    Enterobacterales NOT DETECTED NOT DETECTED Final   Enterobacter cloacae complex NOT DETECTED NOT DETECTED Final   Escherichia coli NOT DETECTED NOT DETECTED Final   Klebsiella aerogenes NOT DETECTED NOT DETECTED Final   Klebsiella oxytoca NOT DETECTED NOT DETECTED Final   Klebsiella pneumoniae NOT DETECTED NOT DETECTED Final   Proteus species NOT DETECTED NOT DETECTED Final   Salmonella species NOT DETECTED NOT DETECTED Final   Serratia marcescens NOT DETECTED NOT DETECTED Final   Haemophilus influenzae NOT DETECTED NOT DETECTED Final   Neisseria meningitidis NOT DETECTED NOT DETECTED Final   Pseudomonas aeruginosa NOT DETECTED NOT DETECTED Final   Stenotrophomonas maltophilia NOT DETECTED NOT DETECTED Final   Candida albicans NOT DETECTED NOT DETECTED Final   Candida auris NOT DETECTED NOT DETECTED Final   Candida glabrata NOT DETECTED NOT DETECTED Final   Candida krusei NOT DETECTED NOT DETECTED Final   Candida parapsilosis NOT DETECTED NOT DETECTED Final   Candida tropicalis NOT DETECTED NOT DETECTED Final   Cryptococcus neoformans/gattii NOT DETECTED NOT DETECTED Final   Methicillin resistance mecA/C NOT DETECTED NOT DETECTED Final    Comment: Performed at [facility], [address] [place], [place] [address]  CULTURE, BLOOD (ROUTINE X 2) w Reflex to ID Panel     Status: None (Preliminary result)   Collection Time: 05/10/21  5:37 PM   Specimen: BLOOD  Result Value Ref Range Status   Specimen Description BLOOD RIGHT ANTECUBITAL  Final   Special Requests   Final    BOTTLES DRAWN AEROBIC ONLY Blood Culture adequate volume   Culture   Final    NO GROWTH < 24 HOURS Performed at [facility], [address] [place], [place] [address]    Report Status PENDING  Incomplete  CULTURE, BLOOD (ROUTINE X 2) w Reflex to ID Panel     Status: None (Preliminary result)   Collection Time: 05/10/21  6:16 PM   Specimen: BLOOD  Result Value Ref Range Status   Specimen  Description BLOOD BLOOD RIGHT HAND  Final   Special Requests   Final    BOTTLES DRAWN AEROBIC AND ANAEROBIC Blood Culture results may not be optimal due to an inadequate volume of blood received in culture bottles   Culture   Final    NO GROWTH < 12 HOURS Performed at [facility], [address] [place], [place] [address]    Report Status PENDING  Incomplete    Coagulation Studies: No results for input(s): LABPROT, INR in the last 72 hours.  Urinalysis: Recent Labs    05/09/21 0205  COLORURINE STRAW*  LABSPEC 1.010  PHURINE 6.0  GLUCOSEU 50*  HGBUR SMALL*  BILIRUBINUR NEGATIVE  KETONESUR NEGATIVE  PROTEINUR 100*  NITRITE NEGATIVE  LEUKOCYTESUR NEGATIVE       Imaging: NM Pulmonary Perfusion  Result Date: 05/09/2021 CLINICAL DATA:  Tachycardia. EXAM: NUCLEAR MEDICINE PERFUSION LUNG SCAN TECHNIQUE: Perfusion images were obtained in multiple projections after intravenous injection of radiopharmaceutical. Ventilation scans intentionally deferred if perfusion scan and chest x-ray adequate for interpretation during COVID 19 epidemic. RADIOPHARMACEUTICALS:  4.31 mCi Tc-6mMAA IV COMPARISON:  Chest radiograph performed on the same date. CT examination dated May 08, 2021 FINDINGS: Prominent cardiac shadow. No segmental filling defect concerning for pulmonary embolism. IMPRESSION: Low probability of pulmonary embolism. Electronically Signed   By: IKeane PoliceD.O.   On: 05/09/2021 14:07   [place]Venous Img Lower Bilateral (DVT)  Result Date: 05/09/2021 CLINICAL DATA:  History of DVT.  Cramping. EXAM: RIGHT LOWER EXTREMITY VENOUS DOPPLER ULTRASOUND TECHNIQUE: Gray-scale sonography with compression, as well as color and duplex ultrasound, were performed to evaluate the deep venous system(s) from the level of the common femoral vein through the popliteal and proximal calf veins. COMPARISON:  Chest XR, 05/09/2021. CT AP, 05/08/2021. RIGHT lower extremity venous duplex, 05/28/2016  (NEGATIVE). FINDINGS: VENOUS Normal compressibility of the RIGHT common femoral vein. Incomplete compressibility of the RIGHT femoral and adjacent portion of the popliteal vein, with eccentric heterogeneously hyperechoic intraluminal lesion. See key image. Visualized portions of profunda femoral vein and great saphenous vein unremarkable. Limited views of the contralateral common femoral vein are unremarkable. OTHER No evidence of superficial thrombophlebitis or abnormal fluid collection. 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 These results will be called to the ordering clinician or representative by the Radiologist Assistant, and communication documented in the PACS or [employer] JMichaelle Birks MD [facility] Center[facility] Electronically Signed   By: JMichaelle BirksM.D.   On: 05/09/2021 15:13   DG Chest Port 1 View  Result Date: 05/09/2021 CLINICAL DATA:  Tachycardia EXAM: PORTABLE CHEST 1 VIEW COMPARISON:  11/12/2020 FINDINGS: Shallow inspiration with low lung volumes. Chronic mild elevation of the right hemidiaphragm. No new consolidation or edema. No pleural effusion. Mild cardiomegaly. IMPRESSION: Shallow inspiration with chronic mild elevation of the right hemidiaphragm. Mild cardiomegaly. Electronically Signed   By: PMacy MisM.D.   On: 05/09/2021 14:05     Medications:    [START ON 05/12/2021]  ceFAZolin (ANCEF) IV      ceFAZolin (ANCEF) IV       apixaban  10 mg Oral BID   Followed by   [Derrill MemoON 05/16/2021] apixaban  5 mg Oral BID   atorvastatin  40 mg Oral Daily   calcitRIOL  0.25 mcg Oral Daily   calcium acetate  1,334 mg Oral TID with meals   Chlorhexidine Gluconate Cloth  6 each Topical Q0600   epoetin (EPOGEN/PROCRIT) injection  4,000 Units Intravenous Q T,Th,Sa-HD   metoprolol tartrate  100 mg Oral BID   senna  1  tablet Oral BID   sodium chloride flush  3 mL Intravenous Q12H   acetaminophen **OR** acetaminophen, bisacodyl, cyclobenzaprine, hydrALAZINE, HYDROmorphone (DILAUDID) injection, lactulose, metoprolol tartrate, ondansetron (ZOFRAN) IV  Assessment/ Plan:  Mr. Hector GURNEYis a 51y.o.  male  with medical problems of Obesity, obstructive sleep apnea, end-stage renal disease on hemodialysis for  about a year was admitted on 05/08/2021 for Hyperkalemia [E87.5] Tachycardia [R00.0] History of DVT (deep vein thrombosis) [Z86.718] Leg cramping [R25.2] Low back pain, unspecified back pain laterality, unspecified chronicity, unspecified whether sciatica present [M54.50]   [address]/Home hemodialysis/MTTF/[place] [facility] [place]/ Lt AVF  #ESRD on  home HD  Patient does home hemodialysis but has not been able to perform dialysis since Sunday because of difficulty with cannulation. Received HD yesterday with UF of about 1.8 L Reports feeling better Plan to follow up with [place] upon discharge  #Severe hyperkalemia in the setting of end-stage renal disease Treated with hemodialysis Potassium 4.9 today  #Metabolic acidosis Corrected with dialysis  #Anemia of chronic kidney disease  Lab Results  Component Value Date   HGB 8.4 (L) 05/11/2021    Continue Epogen with hemodialysis  #Secondary hyperparathyroidism Lab Results  Component Value Date   CALCIUM 8.3 (L) 05/11/2021   CAION 0.89 (LL) 05/22/2020   PHOS 7.2 (H) 05/09/2021   Continue Calcium Acetate and Calcitriol   LOS: 2 Hector Brooks 3/11/202312:51 PM

## 2021-05-11 NOTE — TOC Initial Note (Signed)
Transition of Care (TOC) - Initial/Assessment Note  ? ? ?Patient Details  ?Name: Hector Brooks ?MRN: [id number] ?Date of Birth: [birth year] ? ?Transition of Care (TOC) CM/SW Contact:    ?Demari Kropp E Arabela Basaldua, LCSW ?Phone Number: ?05/11/2021, 10:45 AM ? ?Clinical Narrative:                CSW completed high risk assessment with patient.  ?Patient lives with his wife. Patient either drives himself or wife takes him to appointments. ?Patient does dialysis at home. Patient says he does not have a PCP but is followed closely by his Nephrologist and declines needing PCP resources.  ?Pharmacy is [facility] on [address].  ?Patient denies needs at this time.  ? ? ?Expected Discharge Plan: Home/Self Care ?Barriers to Discharge: Continued Medical Work up ? ? ?Patient Goals and CMS Choice ?Patient states their goals for this hospitalization and ongoing recovery are:: home with spouse ?CMS Medicare.gov Compare Post Acute Care list provided to:: Patient ?Choice offered to / list presented to : Patient ? ?Expected Discharge Plan and Services ?Expected Discharge Plan: Home/Self Care ?  ?  ?  ?Living arrangements for the past 2 months: [place] ?                ?  ?  ?  ?  ?  ?  ?  ?  ?  ?  ? ?Prior Living Arrangements/Services ?Living arrangements for the past 2 months: [place] ?Lives with:: Spouse ?Patient language and need for interpreter reviewed:: Yes ?Do you feel safe going back to the place where you live?: Yes      ?Need for Family Participation in Patient Care: Yes (Comment) ?Care giver support system in place?: Yes (comment) ?  ?Criminal Activity/Legal Involvement Pertinent to Current Situation/Hospitalization: No - Comment as needed ? ?Activities of Daily Living ?Home Assistive Devices/Equipment: None ?ADL Screening (condition at time of admission) ?Patient's cognitive ability adequate to safely complete daily activities?: Yes ?Is the patient deaf or have difficulty hearing?: No ?Does the patient have  difficulty seeing, even when wearing glasses/contacts?: No ?Does the patient have difficulty concentrating, remembering, or making decisions?: Yes ?Patient able to express need for assistance with ADLs?: Yes ?Does the patient have difficulty dressing or bathing?: No ?Independently performs ADLs?: Yes (appropriate for developmental age) ?Does the patient have difficulty walking or climbing stairs?: No ?Weakness of Legs: None ?Weakness of Arms/Hands: None ? ?Permission Sought/Granted ?Permission sought to share information with : Customer service manager ?Permission granted to share information with : Yes, Verbal Permission Granted ?   ? Permission granted to share info w AGENCY: as needed ?   ?   ? ?Emotional Assessment ?  ?  ?  ?Orientation: : Oriented to Self, Oriented to  Time, Oriented to Situation, Oriented to Place ?Alcohol / Substance Use: Not Applicable ?Psych Involvement: No (comment) ? ?Admission diagnosis:  Hyperkalemia [E87.5] ?Tachycardia [R00.0] ?History of DVT (deep vein thrombosis) [Z86.718] ?Leg cramping [R25.2] ?Low back pain, unspecified back pain laterality, unspecified chronicity, unspecified whether sciatica present [M54.50] ?Patient Active Problem List  ? Diagnosis Date Noted  ? Positive blood culture 05/10/2021  ? Hyperkalemia 05/09/2021  ? Metabolic acidosis 12/28/2534  ? ESRD on dialysis ([place]) 05/09/2021  ? Abdominal pain 05/09/2021  ? Sinus tachycardia 05/09/2021  ? Constipation 05/09/2021  ? Chronic deep vein thrombosis (DVT) of right lower extremity ([place]) 05/09/2021  ? Generalized abdominal pain   ? Anemia in chronic kidney disease 09/26/2019  ? Primary  osteoarthritis of right knee 08/23/2019  ? Body mass index 45.0-49.9, adult ([place]) 08/23/2019  ? Obstructive sleep apnea 01/25/2019  ? 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 Generalized edema 07/04/2017  ? History of DVT of lower extremity 07/04/2017  ? Morbid obesity ([place]) 07/04/2017  ? Pure  hypercholesterolemia 07/04/2017  ? Prediabetes 07/04/2017  ? Hypertension 05/21/2016  ? DVT, femoral, chronic ([place]) 09/29/2014  ? ?PCP:  Pcp, No ?Pharmacy:   ?[id number] - [place], [address] ?[address] ?[place] [address] ?Phone: [phone] Fax: [phone] ? ? ? ? ?Social Determinants of Health (SDOH) Interventions ?  ? ?Readmission Risk Interventions ?Readmission Risk Prevention Plan 05/11/2021  ?Transportation Screening Complete  ?PCP or Specialist Appt within 3-5 Days Complete  ?[place] or Home Care Consult Complete  ?Social Work Consult for [place] Planning/Counseling Complete  ?Palliative Care Screening Not Applicable  ?Medication Review Press photographer) Complete  ?Some recent data might be hidden  ? ? ? ?

## 2021-05-11 NOTE — Progress Notes (Signed)
?Progress Note ? ? ?Patient: Hector Brooks GHW:[id number] DOB: 1970/11/07 DOA: 05/08/2021     2 ?DOS: the patient was seen and examined on 05/11/2021 ?  ?Brief hospital course: ?HPI on admission: "Hector Brooks is a pleasant 51 y.o. male with medical history significant for ESRD, hypertension, sleep apnea not on CPAP, and history of DVT, now presenting to the emergency department for evaluation of pain in his flanks and abdomen.  Patient reports approximately 2 weeks of progressive pain in the bilateral flanks and more recently involving the abdomen as well.  He still urinates but denies any dysuria, and denies any fever or chills.  He does hemodialysis at home but was having difficulty with this due to his pain and some difficulty with cannulation, and reports that his last dialysis was on 3/4 or 05/05/2021." ? ?Evaluation in the ED revealed hyperkalemia with K of 7.3. ?CT abdomen/pelvis showed bilateral nonspecific perinephric fat stranding.  Urinalysis was negative for signs of infection.  Admitted to medicine service with nephrology consulted.  Treated with IV calcium, bicarbonate, insulin with dextrose for hyperkalemia and underwent emergent dialysis. ? ?Assessment and Plan: ?* Hyperkalemia ?Presented with K7.3.  Initially treated medically in the ED with IV calcium gluconate, bicarb, insulin and dextrose.  Underwent emergent hemodialysis. ? ?3/10: Potassium level 4.9 this morning. ?-- Continue dialysis per nephrology ?-- Monitor BMP ? ?Abdominal pain ?Presented with generalized abdominal and bilateral flank pain progressive over about 2 weeks.  CT abdomen/pelvis did show some perinephric fat stranding but urinalysis was negative for infection.  Pt reported no BM x 2 weeks, severe constipation likely cause. ? ?3/10: Resolved after large BM with bowel regimen ? ?Positive blood culture ?Blood culture obtained 3/9 as part of infectious evaluation given persistent tachycardia - grew Staph epi, possibly  contaminant. ?--Continue Ancef for now ?--Follow repeat Blood Cx - neg to date ?--D/c antibiotics if repeat BCx remains negative at 48 hours ? ?Sinus tachycardia ?Patient has persistent heart rate in the 120s today.  He reports elevated resting heart rates at baseline but not this high.  Denies chest discomfort, shortness of breath.  Does have history of blood clots and not on a blood thinner any longer. ?Lower extremity Dopplers revealed a chronic right lower extremity DVT but VQ scan was low probability for PE. ?-- UA negative for infection ?-- Chest x-ray with shallow inspiration and chronically elevated right hemidiaphragm.  No infiltrates or opacity to suggest pneumonia and no respiratory symptoms ?-- Blood cultures obtained and growing GPC's --management as outlined ?-- Continue telemetry monitoring ?-- Stop hydralazine, may resume if HR remains elevated ? ?ESRD on dialysis [facility]) ?Patient is on home hemodialysis. ?Required emergent dialysis on admission for K7.3. ?--Nephrology following ?-- Dialysis per nephrology ?-- Monitor electrolytes and hemodynamics ? ?K today 4.9.  ? ?Constipation ?Present on admission, patient reporting no BM in about 2 weeks.  Typically goes regularly 1-2 times daily.  This could explain his abdominal and flank pain potentially. ? ?Resolved with bowel regimen including lactulose, senna, Dulcolax if needed ? ?Hypertension ?Patient was hypotensive with dialysis and required IV fluids for BP support.  Since then, blood pressure has been rather labile as high as 178/108, as low as 137/89. ?-- Stop hydralazine given persistent tachycardia ?-- Home labetalol was changed to metoprolol 100 mg twice daily ?--If additional agent needed for BP control, consider amlodipine ?-- As needed hydralazine ?-- Monitor BP and adjust meds as needed for BP goal 140/90 ? ?Metabolic acidosis ?Due to  renal failure.  Resolved after dialysis. ? ?Chronic deep vein thrombosis (DVT) of right lower extremity  ([place]) ?Patient has not been on anticoagulation prior to admission.  Lower extremity Doppler ultrasound did show a chronic appearing right lower extremity DVT involving the femoral and popliteal veins. ?--Started on Eliquis ? ?History of DVT of lower extremity ?Patient reported history of prior blood clots, not currently on a blood thinner.  With tachycardia, concern for recurrent VTE. ?--D-dimer in renal failure will be elevated and unhelpful ?-- Lower extremity Doppler ultrasound showed chronic appearing right lower extremity DVT involving the popliteal and femoral veins ?-- VQ scan low probability for PE ?-- Started on Eliquis ? ? ?Morbid obesity ([place]) ?Body mass index is 44.9 kg/m?Marland Kitchen ?Complicates overall care and prognosis.  Recommend lifestyle modifications including physical activity and diet for weight loss and overall long-term health. ? ? ? ? ? ?  ? ?Subjective: Patient sitting up in recliner chair when seen this morning.  He reports being tired from poor sleep in the hospital.  Reports some ongoing back pain, states muscle relaxer was helpful but wears off too quickly.  Otherwise he reports feeling fine.  He denies having any issues with cannulating his fistula for dialysis at home prior to admission as was mentioned in previous notes from admission.  No issues with accessing it here during dialysis either.  Denies fevers or chills or otherwise feeling sick. ? ?Physical Exam: ?Vitals:  ? 05/11/21 0500 05/11/21 0502 05/11/21 0739 05/11/21 1157  ?BP:  115/80 123/80 113/63  ?Pulse:  (!) 110 (!) 117 (!) 105  ?Resp:  '16 18 17  '$ ?Temp:  99.3 ?F (37.4 ?C) 98.7 ?F (37.1 ?C) 98.5 ?F (36.9 ?C)  ?TempSrc:      ?SpO2:  96% 97% 93%  ?Weight: 135.3 kg     ? ?General exam: awake, alert, no acute distress ?HEENT: moist mucus membranes, hearing grossly normal  ?Respiratory system: CTAB, no wheezes, rales or rhonchi, normal respiratory effort. ?Cardiovascular system: normal S1/S2, RRR, no pedal edema.   ?Gastrointestinal  system: soft, NT, ND, no HSM felt, +bowel sounds. ?Central nervous system: A&O x3. no gross focal neurologic deficits, normal speech ?Extremities: Left upper extremity fistula noted with clean dry intact dressing in place, no edema, normal tone ?Skin: dry, intact, normal temperature ?Psychiatry: normal mood, congruent affect, judgement and insight appear normal ? ? ?Data Reviewed: ? ?Labs reviewed and notable for chloride 96, BUN 58, creatinine 10.65, calcium 8.3, GFR 5.  Hemoglobin stable 8.4.  Repeat blood cultures no growth at 12 hours as of this morning. ? ?Family Communication: None ? ?Disposition: ?Status is: Inpatient ?Remains inpatient appropriate because: Awaiting repeat blood cultures to be negative at 48 hours prior to discontinuing IV antibiotics for initial positive blood culture.  Remains tachycardic. ? ? ? Planned Discharge Destination: Home ? ? ? ?Time spent: 35 minutes ? ?Author: ?Ezekiel Slocumb, DO ?05/11/2021 12:04 PM ? ?For on call review [email or web address].  ?

## 2021-05-12 ENCOUNTER — Encounter: Payer: Self-pay | Admitting: Internal Medicine

## 2021-05-12 LAB — BASIC METABOLIC PANEL
Anion gap: 13 (ref 5–15)
BUN: 82 mg/dL — ABNORMAL HIGH (ref 6–20)
CO2: 25 mmol/L (ref 22–32)
Calcium: 8.8 mg/dL — ABNORMAL LOW (ref 8.9–10.3)
Chloride: 98 mmol/L (ref 98–111)
Creatinine, Ser: 13.06 mg/dL — ABNORMAL HIGH (ref 0.61–1.24)
GFR, Estimated: 4 mL/min — ABNORMAL LOW (ref >60)
Glucose, Bld: 133 mg/dL — ABNORMAL HIGH (ref 70–99)
Potassium: 4.9 mmol/L (ref 3.5–5.1)
Sodium: 136 mmol/L (ref 135–145)

## 2021-05-12 LAB — CULTURE, BLOOD (SINGLE): Special Requests: ADEQUATE

## 2021-05-12 MED ORDER — METOPROLOL TARTRATE 100 MG PO TABS: 100.0000 mg | ORAL_TABLET | Freq: Two times a day (BID) | ORAL | 1 refills | Status: DC

## 2021-05-12 MED ORDER — APIXABAN 5 MG PO TABS: ORAL_TABLET | ORAL | 0 refills | Status: DC

## 2021-05-12 NOTE — Discharge Summary (Signed)
Physician Discharge Summary   Patient: Hector Brooks MRN: [id number] DOB: [birth year]  Admit date:     05/08/2021  Discharge date: 05/12/21  Discharge Physician: Ezekiel Slocumb   PCP: Pcp, No   Recommendations at discharge:    Follow up PCP and/or Nephrology in 1-2 weeks Follow up on BP and HR control (now on metoprolol, off hydralazine) Repeat BMP/CBC in 1-2 weeks Continue usual hemodialysis schedule, next tomorrow    Discharge Diagnoses: Active Problems:   ESRD on dialysis [facility])   Sinus tachycardia   Positive blood culture   Hypertension   Chronic deep vein thrombosis (DVT) of right lower extremity (HCC)   History of DVT of lower extremity   Morbid obesity ([place])  Principal Problem (Resolved):   Hyperkalemia Resolved Problems:   Abdominal pain   Constipation   Metabolic acidosis  Hospital Course: HPI on admission: "Hector Brooks is a pleasant 51 y.o. male with medical history significant for ESRD, hypertension, sleep apnea not on CPAP, and history of DVT, now presenting to the emergency department for evaluation of pain in his flanks and abdomen.  Patient reports approximately 2 weeks of progressive pain in the bilateral flanks and more recently involving the abdomen as well.  He still urinates but denies any dysuria, and denies any fever or chills.  He does hemodialysis at home but was having difficulty with this due to his pain and some difficulty with cannulation, and reports that his last dialysis was on 3/4 or 05/05/2021."  Evaluation in the ED revealed hyperkalemia with K of 7.3. CT abdomen/pelvis showed bilateral nonspecific perinephric fat stranding.  Urinalysis was negative for signs of infection.  Admitted to medicine service with nephrology consulted.  Treated with IV calcium, bicarbonate, insulin with dextrose for hyperkalemia and underwent emergent dialysis.  Assessment and Plan: * Hyperkalemia-resolved as of 05/12/2021 Presented with K7.3.  Initially  treated medically in the ED with IV calcium gluconate, bicarb, insulin and dextrose.  Underwent emergent hemodialysis.  3/12: Potassium stable 4.9 this morning.   Abdominal pain-resolved as of 05/12/2021 Presented with generalized abdominal and bilateral flank pain progressive over about 2 weeks.  CT abdomen/pelvis did show some perinephric fat stranding but urinalysis was negative for infection.  Pt reported no BM x 2 weeks, severe constipation likely cause.  3/10: Resolved after large BM with bowel regimen  Positive blood culture Blood culture obtained 3/9 as part of infectious evaluation given persistent tachycardia - grew Staph epi, most likely contaminant. Repeat blood cultures negative x2 days so far. Patient has no signs or symptoms of any infection --Stop antibiotics, not indicated   Sinus tachycardia Patient has persistent heart rate in the 120s today.  He reports elevated resting heart rates at baseline.  Denies chest discomfort, shortness of breath.  Does have history of blood clots and not on a blood thinner any longer. Lower extremity Dopplers revealed a chronic right lower extremity DVT but VQ scan was low probability for PE. Infectious work-up negative including UA, chest x-ray and blood cultures. Blood culture initially obtained, grew staph epi which was likely a contaminant.  Repeat blood cultures are negative at 2 days. Patient has no signs or symptoms of infection  Heart rates somewhat improved with metoprolol and being off hydralazine.  Recommend outpatient referral to cardiology if persistent.  ESRD on dialysis [facility]) Patient is on home hemodialysis. Required emergent dialysis on admission for K7.3. --Nephrology consulted -- Dialysis per nephrology --Resume usual home dialysis tomorrow 3/13 -- Monitor electrolytes and  hemodynamics  Patient denied having any cannulation issues with his dialysis access at home or 4 sessions here during  admission.  Hypertension Patient was hypotensive with dialysis and required IV fluids for BP support.  Since then, blood pressure has been rather labile as high as 178/108, as low as 137/89. -- Stopped hydralazine given persistent tachycardia -- Started metoprolol 100 mg twice daily -- Patient reported no longer taking labetalol BP fairly well controlled. PCP and/or nephrology follow-up in 1 to 2 weeks.   Constipation-resolved as of 05/12/2021 Present on admission, patient reporting no BM in about 2 weeks.  Typically goes regularly 1-2 times daily.  This could explain his abdominal and flank pain potentially.  Resolved with bowel regimen including lactulose, senna, Dulcolax if needed  Metabolic acidosis-resolved as of 05/12/2021 Due to renal failure.  Resolved after dialysis.  Chronic deep vein thrombosis (DVT) of right lower extremity ([place]) Patient has not been on anticoagulation prior to admission.  Lower extremity Doppler ultrasound did show a chronic appearing right lower extremity DVT involving the femoral and popliteal veins. --Started on Eliquis  History of DVT of lower extremity Patient reported history of prior blood clots, not currently on a blood thinner.  With tachycardia, concern for recurrent VTE. --D-dimer in renal failure will be elevated and unhelpful -- Lower extremity Doppler ultrasound showed chronic appearing right lower extremity DVT involving the popliteal and femoral veins -- VQ scan low probability for PE -- Started on Eliquis   Morbid obesity ([place]) Body mass index is 44.9 kg/m. Complicates overall care and prognosis.  Recommend lifestyle modifications including physical activity and diet for weight loss and overall long-term health.          Consultants: Nephrology Procedures performed: Hemodialysis Disposition: Home Diet recommendation:  Renal diet DISCHARGE MEDICATION: Allergies as of 05/12/2021   No Known Allergies      Medication List      STOP taking these medications    hydrALAZINE 25 MG tablet Commonly known as: APRESOLINE   labetalol 300 MG tablet Commonly known as: NORMODYNE   sodium bicarbonate 650 MG tablet       TAKE these medications    apixaban 5 MG Tabs tablet Commonly known as: ELIQUIS Take 2 tablets (10 mg total) by mouth 2 (two) times daily for 4 days, THEN 1 tablet (5 mg total) 2 (two) times daily. Start taking on: May 12, 2021   atorvastatin 80 MG tablet Commonly known as: LIPITOR TAKE 1/2 TABLET BY MOUTH DAILY   calcitRIOL 0.25 MCG capsule Commonly known as: ROCALTROL Take 0.25 mcg by mouth daily.   calcium acetate 667 MG capsule Commonly known as: PHOSLO Take 1,334 mg by mouth 3 (three) times daily. With a meal   cyclobenzaprine 10 MG tablet Commonly known as: FLEXERIL Take 1 tablet (10 mg total) by mouth 3 (three) times daily as needed for up to 10 days for muscle spasms.   metoprolol tartrate 100 MG tablet Commonly known as: LOPRESSOR Take 1 tablet (100 mg total) by mouth 2 (two) times daily.        Follow-up Information     Edrick Oh, MD. Go to.   Specialty: Nephrology Why: Follow-up as scheduled Patient to make own follow up appt Contact information: [place] [address] [phone]         Minna Merritts, MD. Schedule an appointment as soon as possible for a visit in 1 week(s).   Specialty: Cardiology Why: Outpatient referral for evaluation of sinus  tachycardia Patient to make own follow up appt Contact information: [place] [address] [phone]                Discharge Exam: Danley Danker Weights   05/10/21 0831 05/10/21 1131 05/11/21 0500  Weight: (!) 137.6 kg 135.1 kg 135.3 kg   General exam: awake, alert, no acute distress, obese HEENT: moist mucus membranes, hearing grossly normal  Respiratory system: normal respiratory effort, on room air. Cardiovascular system: RRR, no peripheral edema.    Central nervous system: A&O x3. no gross focal neurologic deficits, normal speech Extremities: Left upper extremity fistula present, no edema, normal tone Skin: dry, intact, normal temperature Psychiatry: normal mood, congruent affect, judgement and insight appear normal   Condition at discharge: stable  The results of significant diagnostics from this hospitalization (including imaging, microbiology, ancillary and laboratory) are listed below for reference.   Imaging Studies: CT ABDOMEN PELVIS WO CONTRAST  Result Date: 05/08/2021 CLINICAL DATA:  Back pain. EXAM: CT ABDOMEN AND PELVIS WITHOUT CONTRAST TECHNIQUE: Multidetector CT imaging of the abdomen and pelvis was performed following the standard protocol without IV contrast. RADIATION DOSE REDUCTION: This exam was performed according to the departmental dose-optimization program which includes automated exposure control, adjustment of the mA and/or kV according to patient size and/or use of iterative reconstruction technique. COMPARISON:  None. FINDINGS: Lower chest: No acute abnormality. Hepatobiliary: No focal liver abnormality is seen. No gallstones, gallbladder wall thickening, or biliary dilatation. Pancreas: Unremarkable. No pancreatic ductal dilatation or surrounding inflammatory changes. Spleen: Normal in size without focal abnormality. Adrenals/Urinary Tract: Adrenal glands are unremarkable. The kidneys are slightly small in size and lobulated in appearance. There is no evidence of obstructing renal calculi, focal renal lesion, or hydronephrosis. Mild to moderate severity, bilateral, nonspecific perinephric inflammatory fat stranding is seen. Bladder is unremarkable. Stomach/Bowel: Stomach is within normal limits. Appendix appears normal. No evidence of bowel wall thickening, distention, or inflammatory changes. Noninflamed diverticula are seen within the cecum and sigmoid colon. Vascular/Lymphatic: No significant vascular findings are  present. A 16 mm x 13 mm x 15 mm lymph node is seen within the region in between the tail of the pancreas and left kidney. Reproductive: Prostate is unremarkable. Other: A 2.1 cm x 1.8 cm fat containing right inguinal hernia is seen. A similar appearing 3.2 cm x 2.0 cm fat containing left inguinal hernia is noted. No abdominopelvic ascites. Musculoskeletal: No acute or significant osseous findings. IMPRESSION: 1. Bilateral nonspecific perinephric inflammatory fat stranding. While this may be chronic in nature, correlation with urinalysis is recommended to exclude the presence of an acute pyelonephritis. 2. Sigmoid diverticulosis. Electronically Signed   By: Virgina Norfolk M.D.   On: 05/08/2021 23:24   NM Pulmonary Perfusion  Result Date: 05/09/2021 CLINICAL DATA:  Tachycardia. EXAM: NUCLEAR MEDICINE PERFUSION LUNG SCAN TECHNIQUE: Perfusion images were obtained in multiple projections after intravenous injection of radiopharmaceutical. Ventilation scans intentionally deferred if perfusion scan and chest x-ray adequate for interpretation during COVID 19 epidemic. RADIOPHARMACEUTICALS:  4.31 mCi Tc-62mMAA IV COMPARISON:  Chest radiograph performed on the same date. CT examination dated May 08, 2021 FINDINGS: Prominent cardiac shadow. No segmental filling defect concerning for pulmonary embolism. IMPRESSION: Low probability of pulmonary embolism. Electronically Signed   By: IKeane PoliceD.O.   On: 05/09/2021 14:07   UK[place]Venous Img Lower Bilateral (DVT)  Result Date: 05/09/2021 CLINICAL DATA:  History of DVT.  Cramping. EXAM: RIGHT LOWER EXTREMITY VENOUS DOPPLER ULTRASOUND TECHNIQUE: Gray-scale  sonography with compression, as well as color and duplex ultrasound, were performed to evaluate the deep venous system(s) from the level of the common femoral vein through the popliteal and proximal calf veins. COMPARISON:  Chest XR, 05/09/2021. CT AP, 05/08/2021. RIGHT lower extremity venous duplex, 05/28/2016  (NEGATIVE). FINDINGS: VENOUS Normal compressibility of the RIGHT common femoral vein. Incomplete compressibility of the RIGHT femoral and adjacent portion of the popliteal vein, with eccentric heterogeneously hyperechoic intraluminal lesion. See key image. Visualized portions of profunda femoral vein and great saphenous vein unremarkable. Limited views of the contralateral common femoral vein are unremarkable. OTHER No evidence of superficial thrombophlebitis or abnormal fluid collection. 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 These results will be called to the ordering clinician or representative by the Radiologist Assistant, and communication documented in the PACS or [employer]. Michaelle Birks, MD [facility] Radiology Electronically Signed   By: Michaelle Birks M.D.   On: 05/09/2021 15:13   DG Chest Port 1 View  Result Date: 05/09/2021 CLINICAL DATA:  Tachycardia EXAM: PORTABLE CHEST 1 VIEW COMPARISON:  11/12/2020 FINDINGS: Shallow inspiration with low lung volumes. Chronic mild elevation of the right hemidiaphragm. No new consolidation or edema. No pleural effusion. Mild cardiomegaly. IMPRESSION: Shallow inspiration with chronic mild elevation of the right hemidiaphragm. Mild cardiomegaly. Electronically Signed   By: Macy Mis M.D.   On: 05/09/2021 14:05    Microbiology: Results for orders placed or performed during the hospital encounter of 05/08/21  Resp Panel by RT-PCR (Flu A&B, Covid) Nasopharyngeal Swab     Status: None   Collection Time: 05/09/21  3:44 AM   Specimen: Nasopharyngeal Swab; Nasopharyngeal(NP) swabs in vial transport medium  Result Value Ref Range Status   SARS Coronavirus 2 by RT PCR NEGATIVE NEGATIVE Final    Comment: (NOTE) SARS-CoV-2 target nucleic acids are NOT DETECTED.  The SARS-CoV-2 RNA is generally  detectable in upper respiratory specimens during the acute phase of infection. The lowest concentration of SARS-CoV-2 viral copies this assay can detect is 138 copies/mL. A negative result does not preclude SARS-Cov-2 infection and should not be used as the sole basis for treatment or other patient management decisions. A negative result may occur with  improper specimen collection/handling, submission of specimen other than nasopharyngeal swab, presence of viral mutation(s) within the areas targeted by this assay, and inadequate number of viral copies(<138 copies/mL). A negative result must be combined with clinical observations, patient history, and epidemiological information. The expected result is Negative.  Fact Sheet for Patients:  EntrepreneurPulse.com.au  Fact Sheet for Healthcare Providers:  IncredibleEmployment.be  This test is no t yet approved or cleared by the [place] FDA and  has been authorized for detection and/or diagnosis of SARS-CoV-2 by FDA under an Emergency Use Authorization (EUA). This EUA will remain  in effect (meaning this test can be used) for the duration of the COVID-19 declaration under Section 564(b)(1) of the Act, 21 U.S.C.section 360bbb-3(b)(1), unless the authorization is terminated  or revoked sooner.       Influenza A by PCR NEGATIVE NEGATIVE Final   Influenza B by PCR NEGATIVE NEGATIVE Final    Comment: (NOTE) The Xpert Xpress SARS-CoV-2/FLU/RSV plus assay is intended as an aid in the diagnosis of influenza from Nasopharyngeal swab specimens and should not be used as a sole basis for treatment. Nasal washings and aspirates are unacceptable for Xpert Xpress SARS-CoV-2/FLU/RSV testing.  Fact Sheet for Patients: EntrepreneurPulse.com.au  Fact Sheet for Healthcare Providers: IncredibleEmployment.be  This test is not yet approved or cleared by the [place] FDA  and has been authorized for detection and/or diagnosis of SARS-CoV-2 by FDA under an Emergency Use Authorization (EUA). This EUA will remain in effect (meaning this test can be used) for the duration of the COVID-19 declaration under Section 564(b)(1) of the Act, 21 U.S.C. section 360bbb-3(b)(1), unless the authorization is terminated or revoked.  Performed at [facility], [address] [place], [place] [address]   MRSA Next Gen by PCR, Nasal     Status: None   Collection Time: 05/09/21  9:26 AM   Specimen: Nasal Mucosa; Nasal Swab  Result Value Ref Range Status   MRSA by PCR Next Gen NOT DETECTED NOT DETECTED Final    Comment: (NOTE) The GeneXpert MRSA Assay (FDA approved for NASAL specimens only), is one component of a comprehensive MRSA colonization surveillance program. It is not intended to diagnose MRSA infection nor to guide or monitor treatment for MRSA infections. Test performance is not FDA approved in patients less than 66 years old. Performed at [facility], [address] [place], [place] [address]   Culture, blood (single) w Reflex to ID Panel     Status: Abnormal   Collection Time: 05/09/21  8:01 PM   Specimen: BLOOD  Result Value Ref Range Status   Specimen Description   Final    BLOOD RIGHT ANTECUBITAL Performed at [facility], [address] [place], [place] [address]    Special Requests   Final    BOTTLES DRAWN AEROBIC ONLY Blood Culture adequate volume Performed at [facility], [address] [place], [place] [address]    Culture  Setup Time   Final    Organism ID to follow [place] CRITICAL RESULT CALLED TO, READ BACK BY AND VERIFIED WITH: Ouida Sills MOORE 05/10/21 1434 SLM    Culture (A)  Final    STAPHYLOCOCCUS EPIDERMIDIS THE SIGNIFICANCE OF ISOLATING THIS ORGANISM FROM A SINGLE VENIPUNCTURE CANNOT BE PREDICTED WITHOUT FURTHER CLINICAL AND CULTURE CORRELATION.  SUSCEPTIBILITIES AVAILABLE ONLY ON REQUEST. Performed at [facility], [place] [address] [place], [place] [address]    Report Status 05/12/2021 FINAL  Final  Blood Culture ID Panel (Reflexed)     Status: Abnormal   Collection Time: 05/09/21  8:01 PM  Result Value Ref Range Status   Enterococcus faecalis NOT DETECTED NOT DETECTED Final   Enterococcus Faecium NOT DETECTED NOT DETECTED Final   Listeria monocytogenes NOT DETECTED NOT DETECTED Final   Staphylococcus species DETECTED (A) NOT DETECTED Final    Comment: CRITICAL RESULT CALLED TO, READ BACK BY AND VERIFIED WITH: ANDERSON MOORE 05/10/21 1434 SLM    Staphylococcus aureus (BCID) NOT DETECTED NOT DETECTED Final   Staphylococcus epidermidis DETECTED (A) NOT DETECTED Final    Comment: CRITICAL RESULT CALLED TO, READ BACK BY AND VERIFIED WITH: Nilsa Nutting 05/10/21 1434 SLM    Staphylococcus lugdunensis NOT DETECTED NOT DETECTED Final   Streptococcus species NOT DETECTED NOT DETECTED Final   Streptococcus agalactiae NOT DETECTED NOT DETECTED Final   Streptococcus pneumoniae NOT DETECTED NOT DETECTED Final   Streptococcus pyogenes NOT DETECTED NOT DETECTED Final   A.calcoaceticus-baumannii NOT DETECTED NOT DETECTED Final   Bacteroides fragilis NOT DETECTED NOT DETECTED Final   Enterobacterales NOT DETECTED NOT DETECTED Final   Enterobacter cloacae complex NOT DETECTED NOT DETECTED Final   Escherichia coli  NOT DETECTED NOT DETECTED Final   Klebsiella aerogenes NOT DETECTED NOT DETECTED Final   Klebsiella oxytoca NOT DETECTED NOT DETECTED Final   Klebsiella pneumoniae NOT DETECTED NOT DETECTED Final   Proteus species NOT DETECTED NOT DETECTED Final   Salmonella species NOT DETECTED NOT DETECTED Final   Serratia marcescens NOT DETECTED NOT DETECTED Final   Haemophilus influenzae NOT DETECTED NOT DETECTED Final   Neisseria meningitidis NOT DETECTED NOT DETECTED Final   Pseudomonas aeruginosa NOT DETECTED NOT DETECTED Final    Stenotrophomonas maltophilia NOT DETECTED NOT DETECTED Final   Candida albicans NOT DETECTED NOT DETECTED Final   Candida auris NOT DETECTED NOT DETECTED Final   Candida glabrata NOT DETECTED NOT DETECTED Final   Candida krusei NOT DETECTED NOT DETECTED Final   Candida parapsilosis NOT DETECTED NOT DETECTED Final   Candida tropicalis NOT DETECTED NOT DETECTED Final   Cryptococcus neoformans/gattii NOT DETECTED NOT DETECTED Final   Methicillin resistance mecA/C NOT DETECTED NOT DETECTED Final    Comment: Performed at [facility], [address] [place], [place] [address]  CULTURE, BLOOD (ROUTINE X 2) w Reflex to ID Panel     Status: None (Preliminary result)   Collection Time: 05/10/21  5:37 PM   Specimen: BLOOD  Result Value Ref Range Status   Specimen Description BLOOD RIGHT ANTECUBITAL  Final   Special Requests   Final    BOTTLES DRAWN AEROBIC ONLY Blood Culture adequate volume   Culture   Final    NO GROWTH 2 DAYS Performed at [facility], [address] [place], [place] [address]    Report Status PENDING  Incomplete  CULTURE, BLOOD (ROUTINE X 2) w Reflex to ID Panel     Status: None (Preliminary result)   Collection Time: 05/10/21  6:16 PM   Specimen: BLOOD  Result Value Ref Range Status   Specimen Description BLOOD BLOOD RIGHT HAND  Final   Special Requests   Final    BOTTLES DRAWN AEROBIC AND ANAEROBIC Blood Culture results may not be optimal due to an inadequate volume of blood received in culture bottles   Culture   Final    NO GROWTH 2 DAYS Performed at [facility], [address] [place], [place] [address]    Report Status PENDING  Incomplete    Labs: CBC: Recent Labs  Lab 05/08/21 2257 05/09/21 0559 05/10/21 0453 05/11/21 0434  WBC 6.6 6.7 8.2 10.1  NEUTROABS 4.8  --   --   --   HGB 8.7* 8.1* 8.3* 8.4*  HCT 29.4* 26.8* 26.7* 27.4*  MCV 95.5 95.4 90.8 92.9  PLT 204 157 205 341   Basic Metabolic Panel: Recent Labs  Lab  05/08/21 2257 05/09/21 0205 05/09/21 0559 05/10/21 0453 05/11/21 0434 05/12/21 0533  NA 138  --  136 137 135 136  K 7.3* 7.0* 3.9 5.1 4.9 4.9  CL 112*  --  98 101 96* 98  CO2 16*  --  '23 25 27 25  '$ GLUCOSE 93  --  82 95 98 133*  BUN 132*  --  68* 86* 58* 82*  CREATININE 17.89*  --  8.37* 13.08* 10.65* 13.06*  CALCIUM 9.0  --  8.3* 8.5* 8.3* 8.8*  PHOS  --  7.2*  --   --   --   --    Liver Function Tests: Recent Labs  Lab 05/08/21 2257  AST 10*  ALT 8  ALKPHOS 38  BILITOT 0.9  PROT 8.0  ALBUMIN  3.5   CBG: No results for input(s): GLUCAP in the last 168 hours.  Discharge time spent: less than 30 minutes.  Signed: Ezekiel Slocumb, DO [facility] 05/12/2021

## 2021-05-13 DIAGNOSIS — N2581 Secondary hyperparathyroidism of renal origin: Secondary | ICD-10-CM | POA: Diagnosis not present

## 2021-05-13 DIAGNOSIS — N186 End stage renal disease: Secondary | ICD-10-CM | POA: Diagnosis not present

## 2021-05-13 DIAGNOSIS — Z992 Dependence on renal dialysis: Secondary | ICD-10-CM | POA: Diagnosis not present

## 2021-05-14 LAB — HEPATITIS B SURFACE ANTIBODY, QUANTITATIVE: Hep B S AB Quant (Post): 580.5 m[IU]/mL (ref >9.9)

## 2021-05-15 LAB — CULTURE, BLOOD (ROUTINE X 2)
Culture: NO GROWTH
Culture: NO GROWTH
Special Requests: ADEQUATE

## 2021-05-17 DIAGNOSIS — Z992 Dependence on renal dialysis: Secondary | ICD-10-CM | POA: Diagnosis not present

## 2021-05-17 DIAGNOSIS — N186 End stage renal disease: Secondary | ICD-10-CM | POA: Diagnosis not present

## 2021-05-17 DIAGNOSIS — N2581 Secondary hyperparathyroidism of renal origin: Secondary | ICD-10-CM | POA: Diagnosis not present

## 2021-05-22 DIAGNOSIS — Z992 Dependence on renal dialysis: Secondary | ICD-10-CM | POA: Diagnosis not present

## 2021-05-22 DIAGNOSIS — N186 End stage renal disease: Secondary | ICD-10-CM | POA: Diagnosis not present

## 2021-05-22 DIAGNOSIS — N2581 Secondary hyperparathyroidism of renal origin: Secondary | ICD-10-CM | POA: Diagnosis not present

## 2021-05-23 DIAGNOSIS — N2581 Secondary hyperparathyroidism of renal origin: Secondary | ICD-10-CM | POA: Diagnosis not present

## 2021-05-23 DIAGNOSIS — N186 End stage renal disease: Secondary | ICD-10-CM | POA: Diagnosis not present

## 2021-05-23 DIAGNOSIS — Z992 Dependence on renal dialysis: Secondary | ICD-10-CM | POA: Diagnosis not present

## 2021-05-24 DIAGNOSIS — N2581 Secondary hyperparathyroidism of renal origin: Secondary | ICD-10-CM | POA: Diagnosis not present

## 2021-05-24 DIAGNOSIS — N186 End stage renal disease: Secondary | ICD-10-CM | POA: Diagnosis not present

## 2021-05-24 DIAGNOSIS — Z992 Dependence on renal dialysis: Secondary | ICD-10-CM | POA: Diagnosis not present

## 2021-05-27 DIAGNOSIS — N2581 Secondary hyperparathyroidism of renal origin: Secondary | ICD-10-CM | POA: Diagnosis not present

## 2021-05-27 DIAGNOSIS — Z992 Dependence on renal dialysis: Secondary | ICD-10-CM | POA: Diagnosis not present

## 2021-05-27 DIAGNOSIS — N186 End stage renal disease: Secondary | ICD-10-CM | POA: Diagnosis not present

## 2021-05-28 DIAGNOSIS — Z992 Dependence on renal dialysis: Secondary | ICD-10-CM | POA: Diagnosis not present

## 2021-05-28 DIAGNOSIS — N2581 Secondary hyperparathyroidism of renal origin: Secondary | ICD-10-CM | POA: Diagnosis not present

## 2021-05-28 DIAGNOSIS — N186 End stage renal disease: Secondary | ICD-10-CM | POA: Diagnosis not present

## 2021-05-30 DIAGNOSIS — N2581 Secondary hyperparathyroidism of renal origin: Secondary | ICD-10-CM | POA: Diagnosis not present

## 2021-05-30 DIAGNOSIS — Z992 Dependence on renal dialysis: Secondary | ICD-10-CM | POA: Diagnosis not present

## 2021-05-30 DIAGNOSIS — N186 End stage renal disease: Secondary | ICD-10-CM | POA: Diagnosis not present

## 2021-05-31 DIAGNOSIS — N186 End stage renal disease: Secondary | ICD-10-CM | POA: Diagnosis not present

## 2021-05-31 DIAGNOSIS — Z992 Dependence on renal dialysis: Secondary | ICD-10-CM | POA: Diagnosis not present

## 2021-05-31 DIAGNOSIS — N2581 Secondary hyperparathyroidism of renal origin: Secondary | ICD-10-CM | POA: Diagnosis not present

## 2021-05-31 DIAGNOSIS — N049 Nephrotic syndrome with unspecified morphologic changes: Secondary | ICD-10-CM | POA: Diagnosis not present

## 2021-06-03 DIAGNOSIS — N2581 Secondary hyperparathyroidism of renal origin: Secondary | ICD-10-CM | POA: Diagnosis not present

## 2021-06-03 DIAGNOSIS — N186 End stage renal disease: Secondary | ICD-10-CM | POA: Diagnosis not present

## 2021-06-03 DIAGNOSIS — Z992 Dependence on renal dialysis: Secondary | ICD-10-CM | POA: Diagnosis not present

## 2021-06-04 DIAGNOSIS — N2581 Secondary hyperparathyroidism of renal origin: Secondary | ICD-10-CM | POA: Diagnosis not present

## 2021-06-04 DIAGNOSIS — Z992 Dependence on renal dialysis: Secondary | ICD-10-CM | POA: Diagnosis not present

## 2021-06-04 DIAGNOSIS — N186 End stage renal disease: Secondary | ICD-10-CM | POA: Diagnosis not present

## 2021-06-05 DIAGNOSIS — Z992 Dependence on renal dialysis: Secondary | ICD-10-CM | POA: Diagnosis not present

## 2021-06-05 DIAGNOSIS — N2581 Secondary hyperparathyroidism of renal origin: Secondary | ICD-10-CM | POA: Diagnosis not present

## 2021-06-05 DIAGNOSIS — N186 End stage renal disease: Secondary | ICD-10-CM | POA: Diagnosis not present

## 2021-06-06 DIAGNOSIS — Z992 Dependence on renal dialysis: Secondary | ICD-10-CM | POA: Diagnosis not present

## 2021-06-06 DIAGNOSIS — N2581 Secondary hyperparathyroidism of renal origin: Secondary | ICD-10-CM | POA: Diagnosis not present

## 2021-06-06 DIAGNOSIS — N186 End stage renal disease: Secondary | ICD-10-CM | POA: Diagnosis not present

## 2021-06-07 DIAGNOSIS — N186 End stage renal disease: Secondary | ICD-10-CM | POA: Diagnosis not present

## 2021-06-07 DIAGNOSIS — Z992 Dependence on renal dialysis: Secondary | ICD-10-CM | POA: Diagnosis not present

## 2021-06-07 DIAGNOSIS — N2581 Secondary hyperparathyroidism of renal origin: Secondary | ICD-10-CM | POA: Diagnosis not present

## 2021-06-10 DIAGNOSIS — Z992 Dependence on renal dialysis: Secondary | ICD-10-CM | POA: Diagnosis not present

## 2021-06-10 DIAGNOSIS — N186 End stage renal disease: Secondary | ICD-10-CM | POA: Diagnosis not present

## 2021-06-10 DIAGNOSIS — N2581 Secondary hyperparathyroidism of renal origin: Secondary | ICD-10-CM | POA: Diagnosis not present

## 2021-06-11 DIAGNOSIS — N186 End stage renal disease: Secondary | ICD-10-CM | POA: Diagnosis not present

## 2021-06-11 DIAGNOSIS — N2581 Secondary hyperparathyroidism of renal origin: Secondary | ICD-10-CM | POA: Diagnosis not present

## 2021-06-11 DIAGNOSIS — Z992 Dependence on renal dialysis: Secondary | ICD-10-CM | POA: Diagnosis not present

## 2021-06-12 DIAGNOSIS — Z01818 Encounter for other preprocedural examination: Secondary | ICD-10-CM | POA: Diagnosis not present

## 2021-06-12 DIAGNOSIS — I861 Scrotal varices: Secondary | ICD-10-CM | POA: Diagnosis not present

## 2021-06-12 DIAGNOSIS — R935 Abnormal findings on diagnostic imaging of other abdominal regions, including retroperitoneum: Secondary | ICD-10-CM | POA: Diagnosis not present

## 2021-06-13 DIAGNOSIS — N2581 Secondary hyperparathyroidism of renal origin: Secondary | ICD-10-CM | POA: Diagnosis not present

## 2021-06-13 DIAGNOSIS — Z992 Dependence on renal dialysis: Secondary | ICD-10-CM | POA: Diagnosis not present

## 2021-06-13 DIAGNOSIS — N186 End stage renal disease: Secondary | ICD-10-CM | POA: Diagnosis not present

## 2021-06-14 DIAGNOSIS — Z992 Dependence on renal dialysis: Secondary | ICD-10-CM | POA: Diagnosis not present

## 2021-06-14 DIAGNOSIS — N2581 Secondary hyperparathyroidism of renal origin: Secondary | ICD-10-CM | POA: Diagnosis not present

## 2021-06-14 DIAGNOSIS — N186 End stage renal disease: Secondary | ICD-10-CM | POA: Diagnosis not present

## 2021-06-17 DIAGNOSIS — N2581 Secondary hyperparathyroidism of renal origin: Secondary | ICD-10-CM | POA: Diagnosis not present

## 2021-06-17 DIAGNOSIS — N186 End stage renal disease: Secondary | ICD-10-CM | POA: Diagnosis not present

## 2021-06-17 DIAGNOSIS — Z992 Dependence on renal dialysis: Secondary | ICD-10-CM | POA: Diagnosis not present

## 2021-06-18 DIAGNOSIS — N186 End stage renal disease: Secondary | ICD-10-CM | POA: Diagnosis not present

## 2021-06-18 DIAGNOSIS — Z992 Dependence on renal dialysis: Secondary | ICD-10-CM | POA: Diagnosis not present

## 2021-06-18 DIAGNOSIS — N2581 Secondary hyperparathyroidism of renal origin: Secondary | ICD-10-CM | POA: Diagnosis not present

## 2021-06-20 DIAGNOSIS — Z992 Dependence on renal dialysis: Secondary | ICD-10-CM | POA: Diagnosis not present

## 2021-06-20 DIAGNOSIS — N2581 Secondary hyperparathyroidism of renal origin: Secondary | ICD-10-CM | POA: Diagnosis not present

## 2021-06-20 DIAGNOSIS — N186 End stage renal disease: Secondary | ICD-10-CM | POA: Diagnosis not present

## 2021-06-21 DIAGNOSIS — N2581 Secondary hyperparathyroidism of renal origin: Secondary | ICD-10-CM | POA: Diagnosis not present

## 2021-06-21 DIAGNOSIS — N186 End stage renal disease: Secondary | ICD-10-CM | POA: Diagnosis not present

## 2021-06-21 DIAGNOSIS — Z992 Dependence on renal dialysis: Secondary | ICD-10-CM | POA: Diagnosis not present

## 2021-06-24 DIAGNOSIS — N2581 Secondary hyperparathyroidism of renal origin: Secondary | ICD-10-CM | POA: Diagnosis not present

## 2021-06-24 DIAGNOSIS — N186 End stage renal disease: Secondary | ICD-10-CM | POA: Diagnosis not present

## 2021-06-24 DIAGNOSIS — Z992 Dependence on renal dialysis: Secondary | ICD-10-CM | POA: Diagnosis not present

## 2021-06-25 DIAGNOSIS — Z992 Dependence on renal dialysis: Secondary | ICD-10-CM | POA: Diagnosis not present

## 2021-06-25 DIAGNOSIS — N2581 Secondary hyperparathyroidism of renal origin: Secondary | ICD-10-CM | POA: Diagnosis not present

## 2021-06-25 DIAGNOSIS — N186 End stage renal disease: Secondary | ICD-10-CM | POA: Diagnosis not present

## 2021-06-27 DIAGNOSIS — Z992 Dependence on renal dialysis: Secondary | ICD-10-CM | POA: Diagnosis not present

## 2021-06-27 DIAGNOSIS — N186 End stage renal disease: Secondary | ICD-10-CM | POA: Diagnosis not present

## 2021-06-27 DIAGNOSIS — N2581 Secondary hyperparathyroidism of renal origin: Secondary | ICD-10-CM | POA: Diagnosis not present

## 2021-06-28 DIAGNOSIS — Z992 Dependence on renal dialysis: Secondary | ICD-10-CM | POA: Diagnosis not present

## 2021-06-28 DIAGNOSIS — N186 End stage renal disease: Secondary | ICD-10-CM | POA: Diagnosis not present

## 2021-06-28 DIAGNOSIS — N2581 Secondary hyperparathyroidism of renal origin: Secondary | ICD-10-CM | POA: Diagnosis not present

## 2021-06-30 DIAGNOSIS — N186 End stage renal disease: Secondary | ICD-10-CM | POA: Diagnosis not present

## 2021-06-30 DIAGNOSIS — Z992 Dependence on renal dialysis: Secondary | ICD-10-CM | POA: Diagnosis not present

## 2021-06-30 DIAGNOSIS — N049 Nephrotic syndrome with unspecified morphologic changes: Secondary | ICD-10-CM | POA: Diagnosis not present

## 2021-07-01 DIAGNOSIS — N186 End stage renal disease: Secondary | ICD-10-CM | POA: Diagnosis not present

## 2021-07-01 DIAGNOSIS — Z992 Dependence on renal dialysis: Secondary | ICD-10-CM | POA: Diagnosis not present

## 2021-07-01 DIAGNOSIS — N2581 Secondary hyperparathyroidism of renal origin: Secondary | ICD-10-CM | POA: Diagnosis not present

## 2021-07-02 DIAGNOSIS — Z992 Dependence on renal dialysis: Secondary | ICD-10-CM | POA: Diagnosis not present

## 2021-07-02 DIAGNOSIS — N2581 Secondary hyperparathyroidism of renal origin: Secondary | ICD-10-CM | POA: Diagnosis not present

## 2021-07-02 DIAGNOSIS — N186 End stage renal disease: Secondary | ICD-10-CM | POA: Diagnosis not present

## 2021-07-04 DIAGNOSIS — N186 End stage renal disease: Secondary | ICD-10-CM | POA: Diagnosis not present

## 2021-07-04 DIAGNOSIS — N2581 Secondary hyperparathyroidism of renal origin: Secondary | ICD-10-CM | POA: Diagnosis not present

## 2021-07-04 DIAGNOSIS — Z992 Dependence on renal dialysis: Secondary | ICD-10-CM | POA: Diagnosis not present

## 2021-07-05 DIAGNOSIS — N2581 Secondary hyperparathyroidism of renal origin: Secondary | ICD-10-CM | POA: Diagnosis not present

## 2021-07-05 DIAGNOSIS — Z992 Dependence on renal dialysis: Secondary | ICD-10-CM | POA: Diagnosis not present

## 2021-07-05 DIAGNOSIS — N186 End stage renal disease: Secondary | ICD-10-CM | POA: Diagnosis not present

## 2021-07-08 DIAGNOSIS — N2581 Secondary hyperparathyroidism of renal origin: Secondary | ICD-10-CM | POA: Diagnosis not present

## 2021-07-08 DIAGNOSIS — N186 End stage renal disease: Secondary | ICD-10-CM | POA: Diagnosis not present

## 2021-07-08 DIAGNOSIS — Z992 Dependence on renal dialysis: Secondary | ICD-10-CM | POA: Diagnosis not present

## 2021-07-09 DIAGNOSIS — N186 End stage renal disease: Secondary | ICD-10-CM | POA: Diagnosis not present

## 2021-07-09 DIAGNOSIS — Z992 Dependence on renal dialysis: Secondary | ICD-10-CM | POA: Diagnosis not present

## 2021-07-09 DIAGNOSIS — N2581 Secondary hyperparathyroidism of renal origin: Secondary | ICD-10-CM | POA: Diagnosis not present

## 2021-07-11 DIAGNOSIS — Z992 Dependence on renal dialysis: Secondary | ICD-10-CM | POA: Diagnosis not present

## 2021-07-11 DIAGNOSIS — N186 End stage renal disease: Secondary | ICD-10-CM | POA: Diagnosis not present

## 2021-07-11 DIAGNOSIS — N2581 Secondary hyperparathyroidism of renal origin: Secondary | ICD-10-CM | POA: Diagnosis not present

## 2021-07-12 DIAGNOSIS — N2581 Secondary hyperparathyroidism of renal origin: Secondary | ICD-10-CM | POA: Diagnosis not present

## 2021-07-12 DIAGNOSIS — Z992 Dependence on renal dialysis: Secondary | ICD-10-CM | POA: Diagnosis not present

## 2021-07-12 DIAGNOSIS — N186 End stage renal disease: Secondary | ICD-10-CM | POA: Diagnosis not present

## 2021-07-15 DIAGNOSIS — N2581 Secondary hyperparathyroidism of renal origin: Secondary | ICD-10-CM | POA: Diagnosis not present

## 2021-07-15 DIAGNOSIS — Z992 Dependence on renal dialysis: Secondary | ICD-10-CM | POA: Diagnosis not present

## 2021-07-15 DIAGNOSIS — N186 End stage renal disease: Secondary | ICD-10-CM | POA: Diagnosis not present

## 2021-07-16 DIAGNOSIS — N186 End stage renal disease: Secondary | ICD-10-CM | POA: Diagnosis not present

## 2021-07-16 DIAGNOSIS — Z992 Dependence on renal dialysis: Secondary | ICD-10-CM | POA: Diagnosis not present

## 2021-07-16 DIAGNOSIS — N2581 Secondary hyperparathyroidism of renal origin: Secondary | ICD-10-CM | POA: Diagnosis not present

## 2021-07-18 DIAGNOSIS — N2581 Secondary hyperparathyroidism of renal origin: Secondary | ICD-10-CM | POA: Diagnosis not present

## 2021-07-18 DIAGNOSIS — Z992 Dependence on renal dialysis: Secondary | ICD-10-CM | POA: Diagnosis not present

## 2021-07-18 DIAGNOSIS — N186 End stage renal disease: Secondary | ICD-10-CM | POA: Diagnosis not present

## 2021-07-19 DIAGNOSIS — N186 End stage renal disease: Secondary | ICD-10-CM | POA: Diagnosis not present

## 2021-07-19 DIAGNOSIS — N2581 Secondary hyperparathyroidism of renal origin: Secondary | ICD-10-CM | POA: Diagnosis not present

## 2021-07-19 DIAGNOSIS — Z992 Dependence on renal dialysis: Secondary | ICD-10-CM | POA: Diagnosis not present

## 2021-07-22 DIAGNOSIS — N2581 Secondary hyperparathyroidism of renal origin: Secondary | ICD-10-CM | POA: Diagnosis not present

## 2021-07-22 DIAGNOSIS — N186 End stage renal disease: Secondary | ICD-10-CM | POA: Diagnosis not present

## 2021-07-22 DIAGNOSIS — Z992 Dependence on renal dialysis: Secondary | ICD-10-CM | POA: Diagnosis not present

## 2021-07-23 DIAGNOSIS — N186 End stage renal disease: Secondary | ICD-10-CM | POA: Diagnosis not present

## 2021-07-23 DIAGNOSIS — N2581 Secondary hyperparathyroidism of renal origin: Secondary | ICD-10-CM | POA: Diagnosis not present

## 2021-07-23 DIAGNOSIS — Z992 Dependence on renal dialysis: Secondary | ICD-10-CM | POA: Diagnosis not present

## 2021-07-24 DIAGNOSIS — Z01818 Encounter for other preprocedural examination: Secondary | ICD-10-CM | POA: Diagnosis not present

## 2021-07-24 DIAGNOSIS — N186 End stage renal disease: Secondary | ICD-10-CM | POA: Diagnosis not present

## 2021-07-24 DIAGNOSIS — Z992 Dependence on renal dialysis: Secondary | ICD-10-CM | POA: Diagnosis not present

## 2021-07-24 DIAGNOSIS — R935 Abnormal findings on diagnostic imaging of other abdominal regions, including retroperitoneum: Secondary | ICD-10-CM | POA: Diagnosis not present

## 2021-07-25 DIAGNOSIS — Z992 Dependence on renal dialysis: Secondary | ICD-10-CM | POA: Diagnosis not present

## 2021-07-25 DIAGNOSIS — N2581 Secondary hyperparathyroidism of renal origin: Secondary | ICD-10-CM | POA: Diagnosis not present

## 2021-07-25 DIAGNOSIS — N186 End stage renal disease: Secondary | ICD-10-CM | POA: Diagnosis not present

## 2021-07-26 ENCOUNTER — Other Ambulatory Visit: Payer: Self-pay | Admitting: Registered Nurse

## 2021-07-26 DIAGNOSIS — Z992 Dependence on renal dialysis: Secondary | ICD-10-CM | POA: Diagnosis not present

## 2021-07-26 DIAGNOSIS — N2581 Secondary hyperparathyroidism of renal origin: Secondary | ICD-10-CM | POA: Diagnosis not present

## 2021-07-26 DIAGNOSIS — E785 Hyperlipidemia, unspecified: Secondary | ICD-10-CM

## 2021-07-26 DIAGNOSIS — N186 End stage renal disease: Secondary | ICD-10-CM | POA: Diagnosis not present

## 2021-07-29 DIAGNOSIS — N2581 Secondary hyperparathyroidism of renal origin: Secondary | ICD-10-CM | POA: Diagnosis not present

## 2021-07-29 DIAGNOSIS — N186 End stage renal disease: Secondary | ICD-10-CM | POA: Diagnosis not present

## 2021-07-29 DIAGNOSIS — Z992 Dependence on renal dialysis: Secondary | ICD-10-CM | POA: Diagnosis not present

## 2021-07-30 DIAGNOSIS — N186 End stage renal disease: Secondary | ICD-10-CM | POA: Diagnosis not present

## 2021-07-30 DIAGNOSIS — N2581 Secondary hyperparathyroidism of renal origin: Secondary | ICD-10-CM | POA: Diagnosis not present

## 2021-07-30 DIAGNOSIS — Z992 Dependence on renal dialysis: Secondary | ICD-10-CM | POA: Diagnosis not present

## 2021-07-31 DIAGNOSIS — Z992 Dependence on renal dialysis: Secondary | ICD-10-CM | POA: Diagnosis not present

## 2021-07-31 DIAGNOSIS — N186 End stage renal disease: Secondary | ICD-10-CM | POA: Diagnosis not present

## 2021-07-31 DIAGNOSIS — N049 Nephrotic syndrome with unspecified morphologic changes: Secondary | ICD-10-CM | POA: Diagnosis not present

## 2021-08-01 DIAGNOSIS — N2581 Secondary hyperparathyroidism of renal origin: Secondary | ICD-10-CM | POA: Diagnosis not present

## 2021-08-01 DIAGNOSIS — N186 End stage renal disease: Secondary | ICD-10-CM | POA: Diagnosis not present

## 2021-08-01 DIAGNOSIS — Z992 Dependence on renal dialysis: Secondary | ICD-10-CM | POA: Diagnosis not present

## 2021-08-02 DIAGNOSIS — E278 Other specified disorders of adrenal gland: Secondary | ICD-10-CM | POA: Diagnosis not present

## 2021-08-02 DIAGNOSIS — Z992 Dependence on renal dialysis: Secondary | ICD-10-CM | POA: Diagnosis not present

## 2021-08-02 DIAGNOSIS — N186 End stage renal disease: Secondary | ICD-10-CM | POA: Diagnosis not present

## 2021-08-02 DIAGNOSIS — N2581 Secondary hyperparathyroidism of renal origin: Secondary | ICD-10-CM | POA: Diagnosis not present

## 2021-08-05 DIAGNOSIS — N2581 Secondary hyperparathyroidism of renal origin: Secondary | ICD-10-CM | POA: Diagnosis not present

## 2021-08-05 DIAGNOSIS — Z992 Dependence on renal dialysis: Secondary | ICD-10-CM | POA: Diagnosis not present

## 2021-08-05 DIAGNOSIS — N186 End stage renal disease: Secondary | ICD-10-CM | POA: Diagnosis not present

## 2021-08-06 DIAGNOSIS — N2581 Secondary hyperparathyroidism of renal origin: Secondary | ICD-10-CM | POA: Diagnosis not present

## 2021-08-06 DIAGNOSIS — Z992 Dependence on renal dialysis: Secondary | ICD-10-CM | POA: Diagnosis not present

## 2021-08-06 DIAGNOSIS — N186 End stage renal disease: Secondary | ICD-10-CM | POA: Diagnosis not present

## 2021-08-08 DIAGNOSIS — N186 End stage renal disease: Secondary | ICD-10-CM | POA: Diagnosis not present

## 2021-08-08 DIAGNOSIS — N2581 Secondary hyperparathyroidism of renal origin: Secondary | ICD-10-CM | POA: Diagnosis not present

## 2021-08-08 DIAGNOSIS — Z992 Dependence on renal dialysis: Secondary | ICD-10-CM | POA: Diagnosis not present

## 2021-08-09 DIAGNOSIS — Z992 Dependence on renal dialysis: Secondary | ICD-10-CM | POA: Diagnosis not present

## 2021-08-09 DIAGNOSIS — N2581 Secondary hyperparathyroidism of renal origin: Secondary | ICD-10-CM | POA: Diagnosis not present

## 2021-08-09 DIAGNOSIS — N186 End stage renal disease: Secondary | ICD-10-CM | POA: Diagnosis not present

## 2021-08-12 DIAGNOSIS — N186 End stage renal disease: Secondary | ICD-10-CM | POA: Diagnosis not present

## 2021-08-12 DIAGNOSIS — N2581 Secondary hyperparathyroidism of renal origin: Secondary | ICD-10-CM | POA: Diagnosis not present

## 2021-08-12 DIAGNOSIS — Z992 Dependence on renal dialysis: Secondary | ICD-10-CM | POA: Diagnosis not present

## 2021-08-13 DIAGNOSIS — Z992 Dependence on renal dialysis: Secondary | ICD-10-CM | POA: Diagnosis not present

## 2021-08-13 DIAGNOSIS — N186 End stage renal disease: Secondary | ICD-10-CM | POA: Diagnosis not present

## 2021-08-13 DIAGNOSIS — N2581 Secondary hyperparathyroidism of renal origin: Secondary | ICD-10-CM | POA: Diagnosis not present

## 2021-08-15 DIAGNOSIS — N2581 Secondary hyperparathyroidism of renal origin: Secondary | ICD-10-CM | POA: Diagnosis not present

## 2021-08-15 DIAGNOSIS — Z992 Dependence on renal dialysis: Secondary | ICD-10-CM | POA: Diagnosis not present

## 2021-08-15 DIAGNOSIS — N186 End stage renal disease: Secondary | ICD-10-CM | POA: Diagnosis not present

## 2021-08-16 DIAGNOSIS — N186 End stage renal disease: Secondary | ICD-10-CM | POA: Diagnosis not present

## 2021-08-16 DIAGNOSIS — N2581 Secondary hyperparathyroidism of renal origin: Secondary | ICD-10-CM | POA: Diagnosis not present

## 2021-08-16 DIAGNOSIS — Z992 Dependence on renal dialysis: Secondary | ICD-10-CM | POA: Diagnosis not present

## 2021-08-19 DIAGNOSIS — N2581 Secondary hyperparathyroidism of renal origin: Secondary | ICD-10-CM | POA: Diagnosis not present

## 2021-08-19 DIAGNOSIS — N186 End stage renal disease: Secondary | ICD-10-CM | POA: Diagnosis not present

## 2021-08-19 DIAGNOSIS — Z992 Dependence on renal dialysis: Secondary | ICD-10-CM | POA: Diagnosis not present

## 2021-08-20 DIAGNOSIS — Z992 Dependence on renal dialysis: Secondary | ICD-10-CM | POA: Diagnosis not present

## 2021-08-20 DIAGNOSIS — N2581 Secondary hyperparathyroidism of renal origin: Secondary | ICD-10-CM | POA: Diagnosis not present

## 2021-08-20 DIAGNOSIS — N186 End stage renal disease: Secondary | ICD-10-CM | POA: Diagnosis not present

## 2021-08-22 DIAGNOSIS — Z992 Dependence on renal dialysis: Secondary | ICD-10-CM | POA: Diagnosis not present

## 2021-08-22 DIAGNOSIS — N2581 Secondary hyperparathyroidism of renal origin: Secondary | ICD-10-CM | POA: Diagnosis not present

## 2021-08-22 DIAGNOSIS — N186 End stage renal disease: Secondary | ICD-10-CM | POA: Diagnosis not present

## 2021-08-23 DIAGNOSIS — Z992 Dependence on renal dialysis: Secondary | ICD-10-CM | POA: Diagnosis not present

## 2021-08-23 DIAGNOSIS — N186 End stage renal disease: Secondary | ICD-10-CM | POA: Diagnosis not present

## 2021-08-23 DIAGNOSIS — N2581 Secondary hyperparathyroidism of renal origin: Secondary | ICD-10-CM | POA: Diagnosis not present

## 2021-08-26 DIAGNOSIS — N186 End stage renal disease: Secondary | ICD-10-CM | POA: Diagnosis not present

## 2021-08-26 DIAGNOSIS — Z992 Dependence on renal dialysis: Secondary | ICD-10-CM | POA: Diagnosis not present

## 2021-08-26 DIAGNOSIS — N2581 Secondary hyperparathyroidism of renal origin: Secondary | ICD-10-CM | POA: Diagnosis not present

## 2021-08-27 DIAGNOSIS — N2581 Secondary hyperparathyroidism of renal origin: Secondary | ICD-10-CM | POA: Diagnosis not present

## 2021-08-27 DIAGNOSIS — N186 End stage renal disease: Secondary | ICD-10-CM | POA: Diagnosis not present

## 2021-08-27 DIAGNOSIS — Z992 Dependence on renal dialysis: Secondary | ICD-10-CM | POA: Diagnosis not present

## 2021-08-29 DIAGNOSIS — N186 End stage renal disease: Secondary | ICD-10-CM | POA: Diagnosis not present

## 2021-08-29 DIAGNOSIS — Z992 Dependence on renal dialysis: Secondary | ICD-10-CM | POA: Diagnosis not present

## 2021-08-29 DIAGNOSIS — N2581 Secondary hyperparathyroidism of renal origin: Secondary | ICD-10-CM | POA: Diagnosis not present

## 2021-08-30 DIAGNOSIS — N049 Nephrotic syndrome with unspecified morphologic changes: Secondary | ICD-10-CM | POA: Diagnosis not present

## 2021-08-30 DIAGNOSIS — N186 End stage renal disease: Secondary | ICD-10-CM | POA: Diagnosis not present

## 2021-08-30 DIAGNOSIS — N2581 Secondary hyperparathyroidism of renal origin: Secondary | ICD-10-CM | POA: Diagnosis not present

## 2021-08-30 DIAGNOSIS — Z992 Dependence on renal dialysis: Secondary | ICD-10-CM | POA: Diagnosis not present

## 2021-09-02 DIAGNOSIS — Z992 Dependence on renal dialysis: Secondary | ICD-10-CM | POA: Diagnosis not present

## 2021-09-02 DIAGNOSIS — N186 End stage renal disease: Secondary | ICD-10-CM | POA: Diagnosis not present

## 2021-09-02 DIAGNOSIS — N2581 Secondary hyperparathyroidism of renal origin: Secondary | ICD-10-CM | POA: Diagnosis not present

## 2021-09-03 DIAGNOSIS — N186 End stage renal disease: Secondary | ICD-10-CM | POA: Diagnosis not present

## 2021-09-03 DIAGNOSIS — Z992 Dependence on renal dialysis: Secondary | ICD-10-CM | POA: Diagnosis not present

## 2021-09-03 DIAGNOSIS — N2581 Secondary hyperparathyroidism of renal origin: Secondary | ICD-10-CM | POA: Diagnosis not present

## 2021-09-05 DIAGNOSIS — N2581 Secondary hyperparathyroidism of renal origin: Secondary | ICD-10-CM | POA: Diagnosis not present

## 2021-09-05 DIAGNOSIS — Z992 Dependence on renal dialysis: Secondary | ICD-10-CM | POA: Diagnosis not present

## 2021-09-05 DIAGNOSIS — N186 End stage renal disease: Secondary | ICD-10-CM | POA: Diagnosis not present

## 2021-09-06 DIAGNOSIS — Z992 Dependence on renal dialysis: Secondary | ICD-10-CM | POA: Diagnosis not present

## 2021-09-06 DIAGNOSIS — N186 End stage renal disease: Secondary | ICD-10-CM | POA: Diagnosis not present

## 2021-09-06 DIAGNOSIS — N2581 Secondary hyperparathyroidism of renal origin: Secondary | ICD-10-CM | POA: Diagnosis not present

## 2021-09-09 DIAGNOSIS — Z992 Dependence on renal dialysis: Secondary | ICD-10-CM | POA: Diagnosis not present

## 2021-09-09 DIAGNOSIS — N186 End stage renal disease: Secondary | ICD-10-CM | POA: Diagnosis not present

## 2021-09-09 DIAGNOSIS — N2581 Secondary hyperparathyroidism of renal origin: Secondary | ICD-10-CM | POA: Diagnosis not present

## 2021-09-10 DIAGNOSIS — N186 End stage renal disease: Secondary | ICD-10-CM | POA: Diagnosis not present

## 2021-09-10 DIAGNOSIS — Z992 Dependence on renal dialysis: Secondary | ICD-10-CM | POA: Diagnosis not present

## 2021-09-10 DIAGNOSIS — N2581 Secondary hyperparathyroidism of renal origin: Secondary | ICD-10-CM | POA: Diagnosis not present

## 2021-09-12 DIAGNOSIS — N2581 Secondary hyperparathyroidism of renal origin: Secondary | ICD-10-CM | POA: Diagnosis not present

## 2021-09-12 DIAGNOSIS — N186 End stage renal disease: Secondary | ICD-10-CM | POA: Diagnosis not present

## 2021-09-12 DIAGNOSIS — Z992 Dependence on renal dialysis: Secondary | ICD-10-CM | POA: Diagnosis not present

## 2021-09-13 DIAGNOSIS — N186 End stage renal disease: Secondary | ICD-10-CM | POA: Diagnosis not present

## 2021-09-13 DIAGNOSIS — N2581 Secondary hyperparathyroidism of renal origin: Secondary | ICD-10-CM | POA: Diagnosis not present

## 2021-09-13 DIAGNOSIS — Z992 Dependence on renal dialysis: Secondary | ICD-10-CM | POA: Diagnosis not present

## 2021-09-16 DIAGNOSIS — N186 End stage renal disease: Secondary | ICD-10-CM | POA: Diagnosis not present

## 2021-09-16 DIAGNOSIS — N2581 Secondary hyperparathyroidism of renal origin: Secondary | ICD-10-CM | POA: Diagnosis not present

## 2021-09-16 DIAGNOSIS — Z992 Dependence on renal dialysis: Secondary | ICD-10-CM | POA: Diagnosis not present

## 2021-09-17 DIAGNOSIS — N186 End stage renal disease: Secondary | ICD-10-CM | POA: Diagnosis not present

## 2021-09-17 DIAGNOSIS — Z992 Dependence on renal dialysis: Secondary | ICD-10-CM | POA: Diagnosis not present

## 2021-09-17 DIAGNOSIS — N2581 Secondary hyperparathyroidism of renal origin: Secondary | ICD-10-CM | POA: Diagnosis not present

## 2021-09-19 DIAGNOSIS — Z992 Dependence on renal dialysis: Secondary | ICD-10-CM | POA: Diagnosis not present

## 2021-09-19 DIAGNOSIS — N2581 Secondary hyperparathyroidism of renal origin: Secondary | ICD-10-CM | POA: Diagnosis not present

## 2021-09-19 DIAGNOSIS — N186 End stage renal disease: Secondary | ICD-10-CM | POA: Diagnosis not present

## 2021-09-20 DIAGNOSIS — N186 End stage renal disease: Secondary | ICD-10-CM | POA: Diagnosis not present

## 2021-09-20 DIAGNOSIS — Z992 Dependence on renal dialysis: Secondary | ICD-10-CM | POA: Diagnosis not present

## 2021-09-20 DIAGNOSIS — N2581 Secondary hyperparathyroidism of renal origin: Secondary | ICD-10-CM | POA: Diagnosis not present

## 2021-09-30 DIAGNOSIS — N186 End stage renal disease: Secondary | ICD-10-CM | POA: Diagnosis not present

## 2021-09-30 DIAGNOSIS — Z992 Dependence on renal dialysis: Secondary | ICD-10-CM | POA: Diagnosis not present

## 2021-09-30 DIAGNOSIS — N2581 Secondary hyperparathyroidism of renal origin: Secondary | ICD-10-CM | POA: Diagnosis not present

## 2021-10-01 DIAGNOSIS — N2581 Secondary hyperparathyroidism of renal origin: Secondary | ICD-10-CM | POA: Diagnosis not present

## 2021-10-01 DIAGNOSIS — R935 Abnormal findings on diagnostic imaging of other abdominal regions, including retroperitoneum: Secondary | ICD-10-CM | POA: Diagnosis not present

## 2021-10-01 DIAGNOSIS — Z992 Dependence on renal dialysis: Secondary | ICD-10-CM | POA: Diagnosis not present

## 2021-10-01 DIAGNOSIS — E278 Other specified disorders of adrenal gland: Secondary | ICD-10-CM | POA: Diagnosis not present

## 2021-10-01 DIAGNOSIS — N186 End stage renal disease: Secondary | ICD-10-CM | POA: Diagnosis not present

## 2021-10-01 DIAGNOSIS — K668 Other specified disorders of peritoneum: Secondary | ICD-10-CM | POA: Diagnosis not present

## 2021-10-01 DIAGNOSIS — E279 Disorder of adrenal gland, unspecified: Secondary | ICD-10-CM | POA: Diagnosis not present

## 2021-10-01 DIAGNOSIS — J90 Pleural effusion, not elsewhere classified: Secondary | ICD-10-CM | POA: Diagnosis not present

## 2021-10-03 DIAGNOSIS — N2581 Secondary hyperparathyroidism of renal origin: Secondary | ICD-10-CM | POA: Diagnosis not present

## 2021-10-03 DIAGNOSIS — N186 End stage renal disease: Secondary | ICD-10-CM | POA: Diagnosis not present

## 2021-10-03 DIAGNOSIS — Z992 Dependence on renal dialysis: Secondary | ICD-10-CM | POA: Diagnosis not present

## 2021-10-04 DIAGNOSIS — N2581 Secondary hyperparathyroidism of renal origin: Secondary | ICD-10-CM | POA: Diagnosis not present

## 2021-10-04 DIAGNOSIS — N186 End stage renal disease: Secondary | ICD-10-CM | POA: Diagnosis not present

## 2021-10-04 DIAGNOSIS — Z992 Dependence on renal dialysis: Secondary | ICD-10-CM | POA: Diagnosis not present

## 2021-10-07 DIAGNOSIS — Z992 Dependence on renal dialysis: Secondary | ICD-10-CM | POA: Diagnosis not present

## 2021-10-07 DIAGNOSIS — N186 End stage renal disease: Secondary | ICD-10-CM | POA: Diagnosis not present

## 2021-10-07 DIAGNOSIS — N2581 Secondary hyperparathyroidism of renal origin: Secondary | ICD-10-CM | POA: Diagnosis not present

## 2021-10-08 DIAGNOSIS — N186 End stage renal disease: Secondary | ICD-10-CM | POA: Diagnosis not present

## 2021-10-08 DIAGNOSIS — N2581 Secondary hyperparathyroidism of renal origin: Secondary | ICD-10-CM | POA: Diagnosis not present

## 2021-10-08 DIAGNOSIS — Z992 Dependence on renal dialysis: Secondary | ICD-10-CM | POA: Diagnosis not present

## 2021-10-10 DIAGNOSIS — N186 End stage renal disease: Secondary | ICD-10-CM | POA: Diagnosis not present

## 2021-10-10 DIAGNOSIS — Z992 Dependence on renal dialysis: Secondary | ICD-10-CM | POA: Diagnosis not present

## 2021-10-10 DIAGNOSIS — N2581 Secondary hyperparathyroidism of renal origin: Secondary | ICD-10-CM | POA: Diagnosis not present

## 2021-10-11 DIAGNOSIS — N186 End stage renal disease: Secondary | ICD-10-CM | POA: Diagnosis not present

## 2021-10-11 DIAGNOSIS — N2581 Secondary hyperparathyroidism of renal origin: Secondary | ICD-10-CM | POA: Diagnosis not present

## 2021-10-11 DIAGNOSIS — Z992 Dependence on renal dialysis: Secondary | ICD-10-CM | POA: Diagnosis not present

## 2021-10-14 DIAGNOSIS — N2581 Secondary hyperparathyroidism of renal origin: Secondary | ICD-10-CM | POA: Diagnosis not present

## 2021-10-14 DIAGNOSIS — Z992 Dependence on renal dialysis: Secondary | ICD-10-CM | POA: Diagnosis not present

## 2021-10-14 DIAGNOSIS — N186 End stage renal disease: Secondary | ICD-10-CM | POA: Diagnosis not present

## 2021-10-15 DIAGNOSIS — N2581 Secondary hyperparathyroidism of renal origin: Secondary | ICD-10-CM | POA: Diagnosis not present

## 2021-10-15 DIAGNOSIS — Z992 Dependence on renal dialysis: Secondary | ICD-10-CM | POA: Diagnosis not present

## 2021-10-15 DIAGNOSIS — N186 End stage renal disease: Secondary | ICD-10-CM | POA: Diagnosis not present

## 2021-10-17 DIAGNOSIS — Z992 Dependence on renal dialysis: Secondary | ICD-10-CM | POA: Diagnosis not present

## 2021-10-17 DIAGNOSIS — N186 End stage renal disease: Secondary | ICD-10-CM | POA: Diagnosis not present

## 2021-10-17 DIAGNOSIS — N2581 Secondary hyperparathyroidism of renal origin: Secondary | ICD-10-CM | POA: Diagnosis not present

## 2021-10-18 DIAGNOSIS — N186 End stage renal disease: Secondary | ICD-10-CM | POA: Diagnosis not present

## 2021-10-18 DIAGNOSIS — N2581 Secondary hyperparathyroidism of renal origin: Secondary | ICD-10-CM | POA: Diagnosis not present

## 2021-10-18 DIAGNOSIS — Z992 Dependence on renal dialysis: Secondary | ICD-10-CM | POA: Diagnosis not present

## 2021-10-21 DIAGNOSIS — N186 End stage renal disease: Secondary | ICD-10-CM | POA: Diagnosis not present

## 2021-10-21 DIAGNOSIS — N2581 Secondary hyperparathyroidism of renal origin: Secondary | ICD-10-CM | POA: Diagnosis not present

## 2021-10-21 DIAGNOSIS — Z992 Dependence on renal dialysis: Secondary | ICD-10-CM | POA: Diagnosis not present

## 2021-10-22 DIAGNOSIS — N2581 Secondary hyperparathyroidism of renal origin: Secondary | ICD-10-CM | POA: Diagnosis not present

## 2021-10-22 DIAGNOSIS — Z992 Dependence on renal dialysis: Secondary | ICD-10-CM | POA: Diagnosis not present

## 2021-10-22 DIAGNOSIS — N186 End stage renal disease: Secondary | ICD-10-CM | POA: Diagnosis not present

## 2021-10-24 DIAGNOSIS — N186 End stage renal disease: Secondary | ICD-10-CM | POA: Diagnosis not present

## 2021-10-24 DIAGNOSIS — N2581 Secondary hyperparathyroidism of renal origin: Secondary | ICD-10-CM | POA: Diagnosis not present

## 2021-10-24 DIAGNOSIS — Z992 Dependence on renal dialysis: Secondary | ICD-10-CM | POA: Diagnosis not present

## 2021-10-25 DIAGNOSIS — N2581 Secondary hyperparathyroidism of renal origin: Secondary | ICD-10-CM | POA: Diagnosis not present

## 2021-10-25 DIAGNOSIS — Z992 Dependence on renal dialysis: Secondary | ICD-10-CM | POA: Diagnosis not present

## 2021-10-25 DIAGNOSIS — N186 End stage renal disease: Secondary | ICD-10-CM | POA: Diagnosis not present

## 2021-10-28 DIAGNOSIS — N2581 Secondary hyperparathyroidism of renal origin: Secondary | ICD-10-CM | POA: Diagnosis not present

## 2021-10-28 DIAGNOSIS — N186 End stage renal disease: Secondary | ICD-10-CM | POA: Diagnosis not present

## 2021-10-28 DIAGNOSIS — Z992 Dependence on renal dialysis: Secondary | ICD-10-CM | POA: Diagnosis not present

## 2021-10-29 DIAGNOSIS — N186 End stage renal disease: Secondary | ICD-10-CM | POA: Diagnosis not present

## 2021-10-29 DIAGNOSIS — N2581 Secondary hyperparathyroidism of renal origin: Secondary | ICD-10-CM | POA: Diagnosis not present

## 2021-10-29 DIAGNOSIS — Z992 Dependence on renal dialysis: Secondary | ICD-10-CM | POA: Diagnosis not present

## 2021-10-31 DIAGNOSIS — N2581 Secondary hyperparathyroidism of renal origin: Secondary | ICD-10-CM | POA: Diagnosis not present

## 2021-10-31 DIAGNOSIS — N049 Nephrotic syndrome with unspecified morphologic changes: Secondary | ICD-10-CM | POA: Diagnosis not present

## 2021-10-31 DIAGNOSIS — Z992 Dependence on renal dialysis: Secondary | ICD-10-CM | POA: Diagnosis not present

## 2021-10-31 DIAGNOSIS — N186 End stage renal disease: Secondary | ICD-10-CM | POA: Diagnosis not present

## 2021-12-02 DIAGNOSIS — Z79899 Other long term (current) drug therapy: Secondary | ICD-10-CM | POA: Diagnosis not present

## 2021-12-02 DIAGNOSIS — E440 Moderate protein-calorie malnutrition: Secondary | ICD-10-CM | POA: Diagnosis not present

## 2021-12-02 DIAGNOSIS — E875 Hyperkalemia: Secondary | ICD-10-CM | POA: Diagnosis not present

## 2021-12-02 DIAGNOSIS — D509 Iron deficiency anemia, unspecified: Secondary | ICD-10-CM | POA: Diagnosis not present

## 2021-12-02 DIAGNOSIS — R17 Unspecified jaundice: Secondary | ICD-10-CM | POA: Diagnosis not present

## 2021-12-02 DIAGNOSIS — Z992 Dependence on renal dialysis: Secondary | ICD-10-CM | POA: Diagnosis not present

## 2021-12-02 DIAGNOSIS — R82998 Other abnormal findings in urine: Secondary | ICD-10-CM | POA: Diagnosis not present

## 2021-12-02 DIAGNOSIS — N2581 Secondary hyperparathyroidism of renal origin: Secondary | ICD-10-CM | POA: Diagnosis not present

## 2021-12-02 DIAGNOSIS — N186 End stage renal disease: Secondary | ICD-10-CM | POA: Diagnosis not present

## 2021-12-02 DIAGNOSIS — D631 Anemia in chronic kidney disease: Secondary | ICD-10-CM | POA: Diagnosis not present

## 2021-12-03 DIAGNOSIS — Z79899 Other long term (current) drug therapy: Secondary | ICD-10-CM | POA: Diagnosis not present

## 2021-12-03 DIAGNOSIS — E440 Moderate protein-calorie malnutrition: Secondary | ICD-10-CM | POA: Diagnosis not present

## 2021-12-03 DIAGNOSIS — R17 Unspecified jaundice: Secondary | ICD-10-CM | POA: Diagnosis not present

## 2021-12-03 DIAGNOSIS — D631 Anemia in chronic kidney disease: Secondary | ICD-10-CM | POA: Diagnosis not present

## 2021-12-03 DIAGNOSIS — N2581 Secondary hyperparathyroidism of renal origin: Secondary | ICD-10-CM | POA: Diagnosis not present

## 2021-12-03 DIAGNOSIS — D509 Iron deficiency anemia, unspecified: Secondary | ICD-10-CM | POA: Diagnosis not present

## 2021-12-03 DIAGNOSIS — R82998 Other abnormal findings in urine: Secondary | ICD-10-CM | POA: Diagnosis not present

## 2021-12-03 DIAGNOSIS — N186 End stage renal disease: Secondary | ICD-10-CM | POA: Diagnosis not present

## 2021-12-03 DIAGNOSIS — E875 Hyperkalemia: Secondary | ICD-10-CM | POA: Diagnosis not present

## 2021-12-03 DIAGNOSIS — Z992 Dependence on renal dialysis: Secondary | ICD-10-CM | POA: Diagnosis not present

## 2021-12-05 DIAGNOSIS — E875 Hyperkalemia: Secondary | ICD-10-CM | POA: Diagnosis not present

## 2021-12-05 DIAGNOSIS — N2581 Secondary hyperparathyroidism of renal origin: Secondary | ICD-10-CM | POA: Diagnosis not present

## 2021-12-05 DIAGNOSIS — D631 Anemia in chronic kidney disease: Secondary | ICD-10-CM | POA: Diagnosis not present

## 2021-12-05 DIAGNOSIS — Z79899 Other long term (current) drug therapy: Secondary | ICD-10-CM | POA: Diagnosis not present

## 2021-12-05 DIAGNOSIS — R17 Unspecified jaundice: Secondary | ICD-10-CM | POA: Diagnosis not present

## 2021-12-05 DIAGNOSIS — E440 Moderate protein-calorie malnutrition: Secondary | ICD-10-CM | POA: Diagnosis not present

## 2021-12-05 DIAGNOSIS — D509 Iron deficiency anemia, unspecified: Secondary | ICD-10-CM | POA: Diagnosis not present

## 2021-12-05 DIAGNOSIS — N186 End stage renal disease: Secondary | ICD-10-CM | POA: Diagnosis not present

## 2021-12-05 DIAGNOSIS — R82998 Other abnormal findings in urine: Secondary | ICD-10-CM | POA: Diagnosis not present

## 2021-12-05 DIAGNOSIS — Z992 Dependence on renal dialysis: Secondary | ICD-10-CM | POA: Diagnosis not present

## 2021-12-06 DIAGNOSIS — R17 Unspecified jaundice: Secondary | ICD-10-CM | POA: Diagnosis not present

## 2021-12-06 DIAGNOSIS — E875 Hyperkalemia: Secondary | ICD-10-CM | POA: Diagnosis not present

## 2021-12-06 DIAGNOSIS — N186 End stage renal disease: Secondary | ICD-10-CM | POA: Diagnosis not present

## 2021-12-06 DIAGNOSIS — E440 Moderate protein-calorie malnutrition: Secondary | ICD-10-CM | POA: Diagnosis not present

## 2021-12-06 DIAGNOSIS — N2581 Secondary hyperparathyroidism of renal origin: Secondary | ICD-10-CM | POA: Diagnosis not present

## 2021-12-06 DIAGNOSIS — D631 Anemia in chronic kidney disease: Secondary | ICD-10-CM | POA: Diagnosis not present

## 2021-12-06 DIAGNOSIS — Z79899 Other long term (current) drug therapy: Secondary | ICD-10-CM | POA: Diagnosis not present

## 2021-12-06 DIAGNOSIS — Z992 Dependence on renal dialysis: Secondary | ICD-10-CM | POA: Diagnosis not present

## 2021-12-06 DIAGNOSIS — R82998 Other abnormal findings in urine: Secondary | ICD-10-CM | POA: Diagnosis not present

## 2021-12-06 DIAGNOSIS — D509 Iron deficiency anemia, unspecified: Secondary | ICD-10-CM | POA: Diagnosis not present

## 2021-12-09 DIAGNOSIS — R17 Unspecified jaundice: Secondary | ICD-10-CM | POA: Diagnosis not present

## 2021-12-09 DIAGNOSIS — N2581 Secondary hyperparathyroidism of renal origin: Secondary | ICD-10-CM | POA: Diagnosis not present

## 2021-12-09 DIAGNOSIS — E875 Hyperkalemia: Secondary | ICD-10-CM | POA: Diagnosis not present

## 2021-12-09 DIAGNOSIS — R82998 Other abnormal findings in urine: Secondary | ICD-10-CM | POA: Diagnosis not present

## 2021-12-09 DIAGNOSIS — Z79899 Other long term (current) drug therapy: Secondary | ICD-10-CM | POA: Diagnosis not present

## 2021-12-09 DIAGNOSIS — N186 End stage renal disease: Secondary | ICD-10-CM | POA: Diagnosis not present

## 2021-12-09 DIAGNOSIS — D631 Anemia in chronic kidney disease: Secondary | ICD-10-CM | POA: Diagnosis not present

## 2021-12-09 DIAGNOSIS — D509 Iron deficiency anemia, unspecified: Secondary | ICD-10-CM | POA: Diagnosis not present

## 2021-12-09 DIAGNOSIS — Z992 Dependence on renal dialysis: Secondary | ICD-10-CM | POA: Diagnosis not present

## 2021-12-09 DIAGNOSIS — E440 Moderate protein-calorie malnutrition: Secondary | ICD-10-CM | POA: Diagnosis not present

## 2021-12-10 DIAGNOSIS — E440 Moderate protein-calorie malnutrition: Secondary | ICD-10-CM | POA: Diagnosis not present

## 2021-12-10 DIAGNOSIS — N2581 Secondary hyperparathyroidism of renal origin: Secondary | ICD-10-CM | POA: Diagnosis not present

## 2021-12-10 DIAGNOSIS — R17 Unspecified jaundice: Secondary | ICD-10-CM | POA: Diagnosis not present

## 2021-12-10 DIAGNOSIS — D509 Iron deficiency anemia, unspecified: Secondary | ICD-10-CM | POA: Diagnosis not present

## 2021-12-10 DIAGNOSIS — D631 Anemia in chronic kidney disease: Secondary | ICD-10-CM | POA: Diagnosis not present

## 2021-12-10 DIAGNOSIS — Z992 Dependence on renal dialysis: Secondary | ICD-10-CM | POA: Diagnosis not present

## 2021-12-10 DIAGNOSIS — E875 Hyperkalemia: Secondary | ICD-10-CM | POA: Diagnosis not present

## 2021-12-10 DIAGNOSIS — N186 End stage renal disease: Secondary | ICD-10-CM | POA: Diagnosis not present

## 2021-12-10 DIAGNOSIS — Z79899 Other long term (current) drug therapy: Secondary | ICD-10-CM | POA: Diagnosis not present

## 2021-12-10 DIAGNOSIS — R82998 Other abnormal findings in urine: Secondary | ICD-10-CM | POA: Diagnosis not present

## 2021-12-12 DIAGNOSIS — D631 Anemia in chronic kidney disease: Secondary | ICD-10-CM | POA: Diagnosis not present

## 2021-12-12 DIAGNOSIS — R82998 Other abnormal findings in urine: Secondary | ICD-10-CM | POA: Diagnosis not present

## 2021-12-12 DIAGNOSIS — Z01818 Encounter for other preprocedural examination: Secondary | ICD-10-CM | POA: Diagnosis not present

## 2021-12-12 DIAGNOSIS — E440 Moderate protein-calorie malnutrition: Secondary | ICD-10-CM | POA: Diagnosis not present

## 2021-12-12 DIAGNOSIS — D509 Iron deficiency anemia, unspecified: Secondary | ICD-10-CM | POA: Diagnosis not present

## 2021-12-12 DIAGNOSIS — Z86718 Personal history of other venous thrombosis and embolism: Secondary | ICD-10-CM | POA: Diagnosis not present

## 2021-12-12 DIAGNOSIS — R17 Unspecified jaundice: Secondary | ICD-10-CM | POA: Diagnosis not present

## 2021-12-12 DIAGNOSIS — N2581 Secondary hyperparathyroidism of renal origin: Secondary | ICD-10-CM | POA: Diagnosis not present

## 2021-12-12 DIAGNOSIS — I10 Essential (primary) hypertension: Secondary | ICD-10-CM | POA: Diagnosis not present

## 2021-12-12 DIAGNOSIS — Z7682 Awaiting organ transplant status: Secondary | ICD-10-CM | POA: Diagnosis not present

## 2021-12-12 DIAGNOSIS — Z992 Dependence on renal dialysis: Secondary | ICD-10-CM | POA: Diagnosis not present

## 2021-12-12 DIAGNOSIS — Z79899 Other long term (current) drug therapy: Secondary | ICD-10-CM | POA: Diagnosis not present

## 2021-12-12 DIAGNOSIS — Z23 Encounter for immunization: Secondary | ICD-10-CM | POA: Diagnosis not present

## 2021-12-12 DIAGNOSIS — E785 Hyperlipidemia, unspecified: Secondary | ICD-10-CM | POA: Diagnosis not present

## 2021-12-12 DIAGNOSIS — Z1159 Encounter for screening for other viral diseases: Secondary | ICD-10-CM | POA: Diagnosis not present

## 2021-12-12 DIAGNOSIS — I120 Hypertensive chronic kidney disease with stage 5 chronic kidney disease or end stage renal disease: Secondary | ICD-10-CM | POA: Diagnosis not present

## 2021-12-12 DIAGNOSIS — N186 End stage renal disease: Secondary | ICD-10-CM | POA: Diagnosis not present

## 2021-12-12 DIAGNOSIS — E875 Hyperkalemia: Secondary | ICD-10-CM | POA: Diagnosis not present

## 2021-12-13 DIAGNOSIS — Z992 Dependence on renal dialysis: Secondary | ICD-10-CM | POA: Diagnosis not present

## 2021-12-13 DIAGNOSIS — D509 Iron deficiency anemia, unspecified: Secondary | ICD-10-CM | POA: Diagnosis not present

## 2021-12-13 DIAGNOSIS — R82998 Other abnormal findings in urine: Secondary | ICD-10-CM | POA: Diagnosis not present

## 2021-12-13 DIAGNOSIS — N186 End stage renal disease: Secondary | ICD-10-CM | POA: Diagnosis not present

## 2021-12-13 DIAGNOSIS — N2581 Secondary hyperparathyroidism of renal origin: Secondary | ICD-10-CM | POA: Diagnosis not present

## 2021-12-13 DIAGNOSIS — E875 Hyperkalemia: Secondary | ICD-10-CM | POA: Diagnosis not present

## 2021-12-13 DIAGNOSIS — E440 Moderate protein-calorie malnutrition: Secondary | ICD-10-CM | POA: Diagnosis not present

## 2021-12-13 DIAGNOSIS — Z79899 Other long term (current) drug therapy: Secondary | ICD-10-CM | POA: Diagnosis not present

## 2021-12-13 DIAGNOSIS — D631 Anemia in chronic kidney disease: Secondary | ICD-10-CM | POA: Diagnosis not present

## 2021-12-13 DIAGNOSIS — R17 Unspecified jaundice: Secondary | ICD-10-CM | POA: Diagnosis not present

## 2021-12-16 DIAGNOSIS — D631 Anemia in chronic kidney disease: Secondary | ICD-10-CM | POA: Diagnosis not present

## 2021-12-16 DIAGNOSIS — D509 Iron deficiency anemia, unspecified: Secondary | ICD-10-CM | POA: Diagnosis not present

## 2021-12-16 DIAGNOSIS — E440 Moderate protein-calorie malnutrition: Secondary | ICD-10-CM | POA: Diagnosis not present

## 2021-12-16 DIAGNOSIS — Z79899 Other long term (current) drug therapy: Secondary | ICD-10-CM | POA: Diagnosis not present

## 2021-12-16 DIAGNOSIS — R82998 Other abnormal findings in urine: Secondary | ICD-10-CM | POA: Diagnosis not present

## 2021-12-16 DIAGNOSIS — E875 Hyperkalemia: Secondary | ICD-10-CM | POA: Diagnosis not present

## 2021-12-16 DIAGNOSIS — R17 Unspecified jaundice: Secondary | ICD-10-CM | POA: Diagnosis not present

## 2021-12-16 DIAGNOSIS — Z992 Dependence on renal dialysis: Secondary | ICD-10-CM | POA: Diagnosis not present

## 2021-12-16 DIAGNOSIS — N186 End stage renal disease: Secondary | ICD-10-CM | POA: Diagnosis not present

## 2021-12-16 DIAGNOSIS — N2581 Secondary hyperparathyroidism of renal origin: Secondary | ICD-10-CM | POA: Diagnosis not present

## 2021-12-17 DIAGNOSIS — E875 Hyperkalemia: Secondary | ICD-10-CM | POA: Diagnosis not present

## 2021-12-17 DIAGNOSIS — R82998 Other abnormal findings in urine: Secondary | ICD-10-CM | POA: Diagnosis not present

## 2021-12-17 DIAGNOSIS — Z79899 Other long term (current) drug therapy: Secondary | ICD-10-CM | POA: Diagnosis not present

## 2021-12-17 DIAGNOSIS — D631 Anemia in chronic kidney disease: Secondary | ICD-10-CM | POA: Diagnosis not present

## 2021-12-17 DIAGNOSIS — N2581 Secondary hyperparathyroidism of renal origin: Secondary | ICD-10-CM | POA: Diagnosis not present

## 2021-12-17 DIAGNOSIS — D509 Iron deficiency anemia, unspecified: Secondary | ICD-10-CM | POA: Diagnosis not present

## 2021-12-17 DIAGNOSIS — R17 Unspecified jaundice: Secondary | ICD-10-CM | POA: Diagnosis not present

## 2021-12-17 DIAGNOSIS — N186 End stage renal disease: Secondary | ICD-10-CM | POA: Diagnosis not present

## 2021-12-17 DIAGNOSIS — E440 Moderate protein-calorie malnutrition: Secondary | ICD-10-CM | POA: Diagnosis not present

## 2021-12-17 DIAGNOSIS — Z992 Dependence on renal dialysis: Secondary | ICD-10-CM | POA: Diagnosis not present

## 2021-12-19 DIAGNOSIS — D509 Iron deficiency anemia, unspecified: Secondary | ICD-10-CM | POA: Diagnosis not present

## 2021-12-19 DIAGNOSIS — D631 Anemia in chronic kidney disease: Secondary | ICD-10-CM | POA: Diagnosis not present

## 2021-12-19 DIAGNOSIS — E875 Hyperkalemia: Secondary | ICD-10-CM | POA: Diagnosis not present

## 2021-12-19 DIAGNOSIS — N2581 Secondary hyperparathyroidism of renal origin: Secondary | ICD-10-CM | POA: Diagnosis not present

## 2021-12-19 DIAGNOSIS — Z992 Dependence on renal dialysis: Secondary | ICD-10-CM | POA: Diagnosis not present

## 2021-12-19 DIAGNOSIS — Z79899 Other long term (current) drug therapy: Secondary | ICD-10-CM | POA: Diagnosis not present

## 2021-12-19 DIAGNOSIS — R82998 Other abnormal findings in urine: Secondary | ICD-10-CM | POA: Diagnosis not present

## 2021-12-19 DIAGNOSIS — E440 Moderate protein-calorie malnutrition: Secondary | ICD-10-CM | POA: Diagnosis not present

## 2021-12-19 DIAGNOSIS — N186 End stage renal disease: Secondary | ICD-10-CM | POA: Diagnosis not present

## 2021-12-19 DIAGNOSIS — R17 Unspecified jaundice: Secondary | ICD-10-CM | POA: Diagnosis not present

## 2021-12-20 DIAGNOSIS — R82998 Other abnormal findings in urine: Secondary | ICD-10-CM | POA: Diagnosis not present

## 2021-12-20 DIAGNOSIS — Z992 Dependence on renal dialysis: Secondary | ICD-10-CM | POA: Diagnosis not present

## 2021-12-20 DIAGNOSIS — E875 Hyperkalemia: Secondary | ICD-10-CM | POA: Diagnosis not present

## 2021-12-20 DIAGNOSIS — D509 Iron deficiency anemia, unspecified: Secondary | ICD-10-CM | POA: Diagnosis not present

## 2021-12-20 DIAGNOSIS — N2581 Secondary hyperparathyroidism of renal origin: Secondary | ICD-10-CM | POA: Diagnosis not present

## 2021-12-20 DIAGNOSIS — N186 End stage renal disease: Secondary | ICD-10-CM | POA: Diagnosis not present

## 2021-12-20 DIAGNOSIS — E440 Moderate protein-calorie malnutrition: Secondary | ICD-10-CM | POA: Diagnosis not present

## 2021-12-20 DIAGNOSIS — Z79899 Other long term (current) drug therapy: Secondary | ICD-10-CM | POA: Diagnosis not present

## 2021-12-20 DIAGNOSIS — D631 Anemia in chronic kidney disease: Secondary | ICD-10-CM | POA: Diagnosis not present

## 2021-12-20 DIAGNOSIS — R17 Unspecified jaundice: Secondary | ICD-10-CM | POA: Diagnosis not present

## 2021-12-23 DIAGNOSIS — D509 Iron deficiency anemia, unspecified: Secondary | ICD-10-CM | POA: Diagnosis not present

## 2021-12-23 DIAGNOSIS — Z79899 Other long term (current) drug therapy: Secondary | ICD-10-CM | POA: Diagnosis not present

## 2021-12-23 DIAGNOSIS — N2581 Secondary hyperparathyroidism of renal origin: Secondary | ICD-10-CM | POA: Diagnosis not present

## 2021-12-23 DIAGNOSIS — Z992 Dependence on renal dialysis: Secondary | ICD-10-CM | POA: Diagnosis not present

## 2021-12-23 DIAGNOSIS — R82998 Other abnormal findings in urine: Secondary | ICD-10-CM | POA: Diagnosis not present

## 2021-12-23 DIAGNOSIS — R17 Unspecified jaundice: Secondary | ICD-10-CM | POA: Diagnosis not present

## 2021-12-23 DIAGNOSIS — N186 End stage renal disease: Secondary | ICD-10-CM | POA: Diagnosis not present

## 2021-12-23 DIAGNOSIS — E875 Hyperkalemia: Secondary | ICD-10-CM | POA: Diagnosis not present

## 2021-12-23 DIAGNOSIS — E440 Moderate protein-calorie malnutrition: Secondary | ICD-10-CM | POA: Diagnosis not present

## 2021-12-23 DIAGNOSIS — D631 Anemia in chronic kidney disease: Secondary | ICD-10-CM | POA: Diagnosis not present

## 2021-12-24 DIAGNOSIS — D631 Anemia in chronic kidney disease: Secondary | ICD-10-CM | POA: Diagnosis not present

## 2021-12-24 DIAGNOSIS — R82998 Other abnormal findings in urine: Secondary | ICD-10-CM | POA: Diagnosis not present

## 2021-12-24 DIAGNOSIS — N186 End stage renal disease: Secondary | ICD-10-CM | POA: Diagnosis not present

## 2021-12-24 DIAGNOSIS — D509 Iron deficiency anemia, unspecified: Secondary | ICD-10-CM | POA: Diagnosis not present

## 2021-12-24 DIAGNOSIS — Z79899 Other long term (current) drug therapy: Secondary | ICD-10-CM | POA: Diagnosis not present

## 2021-12-24 DIAGNOSIS — Z992 Dependence on renal dialysis: Secondary | ICD-10-CM | POA: Diagnosis not present

## 2021-12-24 DIAGNOSIS — N2581 Secondary hyperparathyroidism of renal origin: Secondary | ICD-10-CM | POA: Diagnosis not present

## 2021-12-24 DIAGNOSIS — E875 Hyperkalemia: Secondary | ICD-10-CM | POA: Diagnosis not present

## 2021-12-24 DIAGNOSIS — E440 Moderate protein-calorie malnutrition: Secondary | ICD-10-CM | POA: Diagnosis not present

## 2021-12-24 DIAGNOSIS — R17 Unspecified jaundice: Secondary | ICD-10-CM | POA: Diagnosis not present

## 2021-12-26 DIAGNOSIS — R17 Unspecified jaundice: Secondary | ICD-10-CM | POA: Diagnosis not present

## 2021-12-26 DIAGNOSIS — Z79899 Other long term (current) drug therapy: Secondary | ICD-10-CM | POA: Diagnosis not present

## 2021-12-26 DIAGNOSIS — E875 Hyperkalemia: Secondary | ICD-10-CM | POA: Diagnosis not present

## 2021-12-26 DIAGNOSIS — N2581 Secondary hyperparathyroidism of renal origin: Secondary | ICD-10-CM | POA: Diagnosis not present

## 2021-12-26 DIAGNOSIS — Z992 Dependence on renal dialysis: Secondary | ICD-10-CM | POA: Diagnosis not present

## 2021-12-26 DIAGNOSIS — N186 End stage renal disease: Secondary | ICD-10-CM | POA: Diagnosis not present

## 2021-12-26 DIAGNOSIS — E440 Moderate protein-calorie malnutrition: Secondary | ICD-10-CM | POA: Diagnosis not present

## 2021-12-26 DIAGNOSIS — D509 Iron deficiency anemia, unspecified: Secondary | ICD-10-CM | POA: Diagnosis not present

## 2021-12-26 DIAGNOSIS — R82998 Other abnormal findings in urine: Secondary | ICD-10-CM | POA: Diagnosis not present

## 2021-12-26 DIAGNOSIS — D631 Anemia in chronic kidney disease: Secondary | ICD-10-CM | POA: Diagnosis not present

## 2021-12-27 DIAGNOSIS — E875 Hyperkalemia: Secondary | ICD-10-CM | POA: Diagnosis not present

## 2021-12-27 DIAGNOSIS — Z992 Dependence on renal dialysis: Secondary | ICD-10-CM | POA: Diagnosis not present

## 2021-12-27 DIAGNOSIS — D631 Anemia in chronic kidney disease: Secondary | ICD-10-CM | POA: Diagnosis not present

## 2021-12-27 DIAGNOSIS — N2581 Secondary hyperparathyroidism of renal origin: Secondary | ICD-10-CM | POA: Diagnosis not present

## 2021-12-27 DIAGNOSIS — N186 End stage renal disease: Secondary | ICD-10-CM | POA: Diagnosis not present

## 2021-12-27 DIAGNOSIS — D509 Iron deficiency anemia, unspecified: Secondary | ICD-10-CM | POA: Diagnosis not present

## 2021-12-27 DIAGNOSIS — R82998 Other abnormal findings in urine: Secondary | ICD-10-CM | POA: Diagnosis not present

## 2021-12-27 DIAGNOSIS — Z79899 Other long term (current) drug therapy: Secondary | ICD-10-CM | POA: Diagnosis not present

## 2021-12-27 DIAGNOSIS — E440 Moderate protein-calorie malnutrition: Secondary | ICD-10-CM | POA: Diagnosis not present

## 2021-12-27 DIAGNOSIS — R17 Unspecified jaundice: Secondary | ICD-10-CM | POA: Diagnosis not present

## 2021-12-29 DIAGNOSIS — N2581 Secondary hyperparathyroidism of renal origin: Secondary | ICD-10-CM | POA: Diagnosis not present

## 2021-12-29 DIAGNOSIS — R82998 Other abnormal findings in urine: Secondary | ICD-10-CM | POA: Diagnosis not present

## 2021-12-29 DIAGNOSIS — Z79899 Other long term (current) drug therapy: Secondary | ICD-10-CM | POA: Diagnosis not present

## 2021-12-29 DIAGNOSIS — Z992 Dependence on renal dialysis: Secondary | ICD-10-CM | POA: Diagnosis not present

## 2021-12-29 DIAGNOSIS — D631 Anemia in chronic kidney disease: Secondary | ICD-10-CM | POA: Diagnosis not present

## 2021-12-29 DIAGNOSIS — D509 Iron deficiency anemia, unspecified: Secondary | ICD-10-CM | POA: Diagnosis not present

## 2021-12-29 DIAGNOSIS — R17 Unspecified jaundice: Secondary | ICD-10-CM | POA: Diagnosis not present

## 2021-12-29 DIAGNOSIS — E875 Hyperkalemia: Secondary | ICD-10-CM | POA: Diagnosis not present

## 2021-12-29 DIAGNOSIS — N186 End stage renal disease: Secondary | ICD-10-CM | POA: Diagnosis not present

## 2021-12-29 DIAGNOSIS — E440 Moderate protein-calorie malnutrition: Secondary | ICD-10-CM | POA: Diagnosis not present

## 2021-12-30 DIAGNOSIS — N186 End stage renal disease: Secondary | ICD-10-CM | POA: Diagnosis not present

## 2021-12-30 DIAGNOSIS — R82998 Other abnormal findings in urine: Secondary | ICD-10-CM | POA: Diagnosis not present

## 2021-12-30 DIAGNOSIS — E875 Hyperkalemia: Secondary | ICD-10-CM | POA: Diagnosis not present

## 2021-12-30 DIAGNOSIS — R17 Unspecified jaundice: Secondary | ICD-10-CM | POA: Diagnosis not present

## 2021-12-30 DIAGNOSIS — E440 Moderate protein-calorie malnutrition: Secondary | ICD-10-CM | POA: Diagnosis not present

## 2021-12-30 DIAGNOSIS — Z79899 Other long term (current) drug therapy: Secondary | ICD-10-CM | POA: Diagnosis not present

## 2021-12-30 DIAGNOSIS — D631 Anemia in chronic kidney disease: Secondary | ICD-10-CM | POA: Diagnosis not present

## 2021-12-30 DIAGNOSIS — Z992 Dependence on renal dialysis: Secondary | ICD-10-CM | POA: Diagnosis not present

## 2021-12-30 DIAGNOSIS — D509 Iron deficiency anemia, unspecified: Secondary | ICD-10-CM | POA: Diagnosis not present

## 2021-12-30 DIAGNOSIS — N2581 Secondary hyperparathyroidism of renal origin: Secondary | ICD-10-CM | POA: Diagnosis not present

## 2021-12-31 DIAGNOSIS — E875 Hyperkalemia: Secondary | ICD-10-CM | POA: Diagnosis not present

## 2021-12-31 DIAGNOSIS — D509 Iron deficiency anemia, unspecified: Secondary | ICD-10-CM | POA: Diagnosis not present

## 2021-12-31 DIAGNOSIS — R17 Unspecified jaundice: Secondary | ICD-10-CM | POA: Diagnosis not present

## 2021-12-31 DIAGNOSIS — Z79899 Other long term (current) drug therapy: Secondary | ICD-10-CM | POA: Diagnosis not present

## 2021-12-31 DIAGNOSIS — N2581 Secondary hyperparathyroidism of renal origin: Secondary | ICD-10-CM | POA: Diagnosis not present

## 2021-12-31 DIAGNOSIS — N186 End stage renal disease: Secondary | ICD-10-CM | POA: Diagnosis not present

## 2021-12-31 DIAGNOSIS — E440 Moderate protein-calorie malnutrition: Secondary | ICD-10-CM | POA: Diagnosis not present

## 2021-12-31 DIAGNOSIS — R82998 Other abnormal findings in urine: Secondary | ICD-10-CM | POA: Diagnosis not present

## 2021-12-31 DIAGNOSIS — Z992 Dependence on renal dialysis: Secondary | ICD-10-CM | POA: Diagnosis not present

## 2021-12-31 DIAGNOSIS — D631 Anemia in chronic kidney disease: Secondary | ICD-10-CM | POA: Diagnosis not present

## 2022-01-01 DIAGNOSIS — N186 End stage renal disease: Secondary | ICD-10-CM | POA: Diagnosis not present

## 2022-01-01 DIAGNOSIS — E440 Moderate protein-calorie malnutrition: Secondary | ICD-10-CM | POA: Diagnosis not present

## 2022-01-01 DIAGNOSIS — R17 Unspecified jaundice: Secondary | ICD-10-CM | POA: Diagnosis not present

## 2022-01-01 DIAGNOSIS — Z79899 Other long term (current) drug therapy: Secondary | ICD-10-CM | POA: Diagnosis not present

## 2022-01-01 DIAGNOSIS — Z992 Dependence on renal dialysis: Secondary | ICD-10-CM | POA: Diagnosis not present

## 2022-01-01 DIAGNOSIS — N2581 Secondary hyperparathyroidism of renal origin: Secondary | ICD-10-CM | POA: Diagnosis not present

## 2022-01-01 DIAGNOSIS — D509 Iron deficiency anemia, unspecified: Secondary | ICD-10-CM | POA: Diagnosis not present

## 2022-01-01 DIAGNOSIS — E875 Hyperkalemia: Secondary | ICD-10-CM | POA: Diagnosis not present

## 2022-01-01 DIAGNOSIS — R82998 Other abnormal findings in urine: Secondary | ICD-10-CM | POA: Diagnosis not present

## 2022-01-01 DIAGNOSIS — D631 Anemia in chronic kidney disease: Secondary | ICD-10-CM | POA: Diagnosis not present

## 2022-01-02 DIAGNOSIS — Z79899 Other long term (current) drug therapy: Secondary | ICD-10-CM | POA: Diagnosis not present

## 2022-01-02 DIAGNOSIS — D509 Iron deficiency anemia, unspecified: Secondary | ICD-10-CM | POA: Diagnosis not present

## 2022-01-02 DIAGNOSIS — N2581 Secondary hyperparathyroidism of renal origin: Secondary | ICD-10-CM | POA: Diagnosis not present

## 2022-01-02 DIAGNOSIS — E440 Moderate protein-calorie malnutrition: Secondary | ICD-10-CM | POA: Diagnosis not present

## 2022-01-02 DIAGNOSIS — Z992 Dependence on renal dialysis: Secondary | ICD-10-CM | POA: Diagnosis not present

## 2022-01-02 DIAGNOSIS — N186 End stage renal disease: Secondary | ICD-10-CM | POA: Diagnosis not present

## 2022-01-02 DIAGNOSIS — E875 Hyperkalemia: Secondary | ICD-10-CM | POA: Diagnosis not present

## 2022-01-02 DIAGNOSIS — D631 Anemia in chronic kidney disease: Secondary | ICD-10-CM | POA: Diagnosis not present

## 2022-01-02 DIAGNOSIS — R82998 Other abnormal findings in urine: Secondary | ICD-10-CM | POA: Diagnosis not present

## 2022-01-02 DIAGNOSIS — R17 Unspecified jaundice: Secondary | ICD-10-CM | POA: Diagnosis not present

## 2022-01-03 DIAGNOSIS — E875 Hyperkalemia: Secondary | ICD-10-CM | POA: Diagnosis not present

## 2022-01-03 DIAGNOSIS — Z79899 Other long term (current) drug therapy: Secondary | ICD-10-CM | POA: Diagnosis not present

## 2022-01-03 DIAGNOSIS — D631 Anemia in chronic kidney disease: Secondary | ICD-10-CM | POA: Diagnosis not present

## 2022-01-03 DIAGNOSIS — N2581 Secondary hyperparathyroidism of renal origin: Secondary | ICD-10-CM | POA: Diagnosis not present

## 2022-01-03 DIAGNOSIS — R17 Unspecified jaundice: Secondary | ICD-10-CM | POA: Diagnosis not present

## 2022-01-03 DIAGNOSIS — R82998 Other abnormal findings in urine: Secondary | ICD-10-CM | POA: Diagnosis not present

## 2022-01-03 DIAGNOSIS — D509 Iron deficiency anemia, unspecified: Secondary | ICD-10-CM | POA: Diagnosis not present

## 2022-01-03 DIAGNOSIS — E440 Moderate protein-calorie malnutrition: Secondary | ICD-10-CM | POA: Diagnosis not present

## 2022-01-03 DIAGNOSIS — Z992 Dependence on renal dialysis: Secondary | ICD-10-CM | POA: Diagnosis not present

## 2022-01-03 DIAGNOSIS — N186 End stage renal disease: Secondary | ICD-10-CM | POA: Diagnosis not present

## 2022-01-04 DIAGNOSIS — Z992 Dependence on renal dialysis: Secondary | ICD-10-CM | POA: Diagnosis not present

## 2022-01-04 DIAGNOSIS — E875 Hyperkalemia: Secondary | ICD-10-CM | POA: Diagnosis not present

## 2022-01-04 DIAGNOSIS — R82998 Other abnormal findings in urine: Secondary | ICD-10-CM | POA: Diagnosis not present

## 2022-01-04 DIAGNOSIS — E440 Moderate protein-calorie malnutrition: Secondary | ICD-10-CM | POA: Diagnosis not present

## 2022-01-04 DIAGNOSIS — N2581 Secondary hyperparathyroidism of renal origin: Secondary | ICD-10-CM | POA: Diagnosis not present

## 2022-01-04 DIAGNOSIS — D509 Iron deficiency anemia, unspecified: Secondary | ICD-10-CM | POA: Diagnosis not present

## 2022-01-04 DIAGNOSIS — R17 Unspecified jaundice: Secondary | ICD-10-CM | POA: Diagnosis not present

## 2022-01-04 DIAGNOSIS — Z79899 Other long term (current) drug therapy: Secondary | ICD-10-CM | POA: Diagnosis not present

## 2022-01-04 DIAGNOSIS — D631 Anemia in chronic kidney disease: Secondary | ICD-10-CM | POA: Diagnosis not present

## 2022-01-04 DIAGNOSIS — N186 End stage renal disease: Secondary | ICD-10-CM | POA: Diagnosis not present

## 2022-01-05 DIAGNOSIS — D631 Anemia in chronic kidney disease: Secondary | ICD-10-CM | POA: Diagnosis not present

## 2022-01-05 DIAGNOSIS — E875 Hyperkalemia: Secondary | ICD-10-CM | POA: Diagnosis not present

## 2022-01-05 DIAGNOSIS — R82998 Other abnormal findings in urine: Secondary | ICD-10-CM | POA: Diagnosis not present

## 2022-01-05 DIAGNOSIS — D509 Iron deficiency anemia, unspecified: Secondary | ICD-10-CM | POA: Diagnosis not present

## 2022-01-05 DIAGNOSIS — Z79899 Other long term (current) drug therapy: Secondary | ICD-10-CM | POA: Diagnosis not present

## 2022-01-05 DIAGNOSIS — R17 Unspecified jaundice: Secondary | ICD-10-CM | POA: Diagnosis not present

## 2022-01-05 DIAGNOSIS — N186 End stage renal disease: Secondary | ICD-10-CM | POA: Diagnosis not present

## 2022-01-05 DIAGNOSIS — N2581 Secondary hyperparathyroidism of renal origin: Secondary | ICD-10-CM | POA: Diagnosis not present

## 2022-01-05 DIAGNOSIS — Z992 Dependence on renal dialysis: Secondary | ICD-10-CM | POA: Diagnosis not present

## 2022-01-05 DIAGNOSIS — E440 Moderate protein-calorie malnutrition: Secondary | ICD-10-CM | POA: Diagnosis not present

## 2022-01-08 DIAGNOSIS — D509 Iron deficiency anemia, unspecified: Secondary | ICD-10-CM | POA: Diagnosis not present

## 2022-01-08 DIAGNOSIS — N2581 Secondary hyperparathyroidism of renal origin: Secondary | ICD-10-CM | POA: Diagnosis not present

## 2022-01-08 DIAGNOSIS — E440 Moderate protein-calorie malnutrition: Secondary | ICD-10-CM | POA: Diagnosis not present

## 2022-01-08 DIAGNOSIS — N186 End stage renal disease: Secondary | ICD-10-CM | POA: Diagnosis not present

## 2022-01-08 DIAGNOSIS — Z79899 Other long term (current) drug therapy: Secondary | ICD-10-CM | POA: Diagnosis not present

## 2022-01-08 DIAGNOSIS — R82998 Other abnormal findings in urine: Secondary | ICD-10-CM | POA: Diagnosis not present

## 2022-01-08 DIAGNOSIS — D631 Anemia in chronic kidney disease: Secondary | ICD-10-CM | POA: Diagnosis not present

## 2022-01-08 DIAGNOSIS — E875 Hyperkalemia: Secondary | ICD-10-CM | POA: Diagnosis not present

## 2022-01-08 DIAGNOSIS — R17 Unspecified jaundice: Secondary | ICD-10-CM | POA: Diagnosis not present

## 2022-01-08 DIAGNOSIS — Z992 Dependence on renal dialysis: Secondary | ICD-10-CM | POA: Diagnosis not present

## 2022-01-09 DIAGNOSIS — R17 Unspecified jaundice: Secondary | ICD-10-CM | POA: Diagnosis not present

## 2022-01-09 DIAGNOSIS — N186 End stage renal disease: Secondary | ICD-10-CM | POA: Diagnosis not present

## 2022-01-09 DIAGNOSIS — R82998 Other abnormal findings in urine: Secondary | ICD-10-CM | POA: Diagnosis not present

## 2022-01-09 DIAGNOSIS — N2581 Secondary hyperparathyroidism of renal origin: Secondary | ICD-10-CM | POA: Diagnosis not present

## 2022-01-09 DIAGNOSIS — E875 Hyperkalemia: Secondary | ICD-10-CM | POA: Diagnosis not present

## 2022-01-09 DIAGNOSIS — D631 Anemia in chronic kidney disease: Secondary | ICD-10-CM | POA: Diagnosis not present

## 2022-01-09 DIAGNOSIS — E440 Moderate protein-calorie malnutrition: Secondary | ICD-10-CM | POA: Diagnosis not present

## 2022-01-09 DIAGNOSIS — Z992 Dependence on renal dialysis: Secondary | ICD-10-CM | POA: Diagnosis not present

## 2022-01-09 DIAGNOSIS — Z79899 Other long term (current) drug therapy: Secondary | ICD-10-CM | POA: Diagnosis not present

## 2022-01-09 DIAGNOSIS — D509 Iron deficiency anemia, unspecified: Secondary | ICD-10-CM | POA: Diagnosis not present

## 2022-01-11 DIAGNOSIS — D509 Iron deficiency anemia, unspecified: Secondary | ICD-10-CM | POA: Diagnosis not present

## 2022-01-11 DIAGNOSIS — E875 Hyperkalemia: Secondary | ICD-10-CM | POA: Diagnosis not present

## 2022-01-11 DIAGNOSIS — D631 Anemia in chronic kidney disease: Secondary | ICD-10-CM | POA: Diagnosis not present

## 2022-01-11 DIAGNOSIS — Z79899 Other long term (current) drug therapy: Secondary | ICD-10-CM | POA: Diagnosis not present

## 2022-01-11 DIAGNOSIS — Z992 Dependence on renal dialysis: Secondary | ICD-10-CM | POA: Diagnosis not present

## 2022-01-11 DIAGNOSIS — R82998 Other abnormal findings in urine: Secondary | ICD-10-CM | POA: Diagnosis not present

## 2022-01-11 DIAGNOSIS — N186 End stage renal disease: Secondary | ICD-10-CM | POA: Diagnosis not present

## 2022-01-11 DIAGNOSIS — N2581 Secondary hyperparathyroidism of renal origin: Secondary | ICD-10-CM | POA: Diagnosis not present

## 2022-01-11 DIAGNOSIS — E440 Moderate protein-calorie malnutrition: Secondary | ICD-10-CM | POA: Diagnosis not present

## 2022-01-11 DIAGNOSIS — R17 Unspecified jaundice: Secondary | ICD-10-CM | POA: Diagnosis not present

## 2022-01-12 DIAGNOSIS — Z79899 Other long term (current) drug therapy: Secondary | ICD-10-CM | POA: Diagnosis not present

## 2022-01-12 DIAGNOSIS — R82998 Other abnormal findings in urine: Secondary | ICD-10-CM | POA: Diagnosis not present

## 2022-01-12 DIAGNOSIS — N2581 Secondary hyperparathyroidism of renal origin: Secondary | ICD-10-CM | POA: Diagnosis not present

## 2022-01-12 DIAGNOSIS — N186 End stage renal disease: Secondary | ICD-10-CM | POA: Diagnosis not present

## 2022-01-12 DIAGNOSIS — D631 Anemia in chronic kidney disease: Secondary | ICD-10-CM | POA: Diagnosis not present

## 2022-01-12 DIAGNOSIS — Z992 Dependence on renal dialysis: Secondary | ICD-10-CM | POA: Diagnosis not present

## 2022-01-12 DIAGNOSIS — R17 Unspecified jaundice: Secondary | ICD-10-CM | POA: Diagnosis not present

## 2022-01-12 DIAGNOSIS — E875 Hyperkalemia: Secondary | ICD-10-CM | POA: Diagnosis not present

## 2022-01-12 DIAGNOSIS — E440 Moderate protein-calorie malnutrition: Secondary | ICD-10-CM | POA: Diagnosis not present

## 2022-01-12 DIAGNOSIS — D509 Iron deficiency anemia, unspecified: Secondary | ICD-10-CM | POA: Diagnosis not present

## 2022-01-13 DIAGNOSIS — E875 Hyperkalemia: Secondary | ICD-10-CM | POA: Diagnosis not present

## 2022-01-13 DIAGNOSIS — D631 Anemia in chronic kidney disease: Secondary | ICD-10-CM | POA: Diagnosis not present

## 2022-01-13 DIAGNOSIS — E440 Moderate protein-calorie malnutrition: Secondary | ICD-10-CM | POA: Diagnosis not present

## 2022-01-13 DIAGNOSIS — Z992 Dependence on renal dialysis: Secondary | ICD-10-CM | POA: Diagnosis not present

## 2022-01-13 DIAGNOSIS — N2581 Secondary hyperparathyroidism of renal origin: Secondary | ICD-10-CM | POA: Diagnosis not present

## 2022-01-13 DIAGNOSIS — N186 End stage renal disease: Secondary | ICD-10-CM | POA: Diagnosis not present

## 2022-01-13 DIAGNOSIS — D509 Iron deficiency anemia, unspecified: Secondary | ICD-10-CM | POA: Diagnosis not present

## 2022-01-13 DIAGNOSIS — Z79899 Other long term (current) drug therapy: Secondary | ICD-10-CM | POA: Diagnosis not present

## 2022-01-13 DIAGNOSIS — R17 Unspecified jaundice: Secondary | ICD-10-CM | POA: Diagnosis not present

## 2022-01-13 DIAGNOSIS — R82998 Other abnormal findings in urine: Secondary | ICD-10-CM | POA: Diagnosis not present

## 2022-01-14 DIAGNOSIS — E440 Moderate protein-calorie malnutrition: Secondary | ICD-10-CM | POA: Diagnosis not present

## 2022-01-14 DIAGNOSIS — R17 Unspecified jaundice: Secondary | ICD-10-CM | POA: Diagnosis not present

## 2022-01-14 DIAGNOSIS — N186 End stage renal disease: Secondary | ICD-10-CM | POA: Diagnosis not present

## 2022-01-14 DIAGNOSIS — D631 Anemia in chronic kidney disease: Secondary | ICD-10-CM | POA: Diagnosis not present

## 2022-01-14 DIAGNOSIS — Z79899 Other long term (current) drug therapy: Secondary | ICD-10-CM | POA: Diagnosis not present

## 2022-01-14 DIAGNOSIS — Z992 Dependence on renal dialysis: Secondary | ICD-10-CM | POA: Diagnosis not present

## 2022-01-14 DIAGNOSIS — E875 Hyperkalemia: Secondary | ICD-10-CM | POA: Diagnosis not present

## 2022-01-14 DIAGNOSIS — N2581 Secondary hyperparathyroidism of renal origin: Secondary | ICD-10-CM | POA: Diagnosis not present

## 2022-01-14 DIAGNOSIS — D509 Iron deficiency anemia, unspecified: Secondary | ICD-10-CM | POA: Diagnosis not present

## 2022-01-14 DIAGNOSIS — R82998 Other abnormal findings in urine: Secondary | ICD-10-CM | POA: Diagnosis not present

## 2022-01-15 ENCOUNTER — Other Ambulatory Visit: Payer: Self-pay

## 2022-01-15 ENCOUNTER — Encounter (HOSPITAL_COMMUNITY): Payer: Self-pay

## 2022-01-15 ENCOUNTER — Other Ambulatory Visit (HOSPITAL_BASED_OUTPATIENT_CLINIC_OR_DEPARTMENT_OTHER): Payer: Self-pay

## 2022-01-15 ENCOUNTER — Encounter: Payer: Self-pay | Admitting: Gastroenterology

## 2022-01-15 DIAGNOSIS — E041 Nontoxic single thyroid nodule: Secondary | ICD-10-CM

## 2022-01-16 ENCOUNTER — Telehealth: Payer: Self-pay | Admitting: *Deleted

## 2022-01-16 ENCOUNTER — Other Ambulatory Visit (HOSPITAL_BASED_OUTPATIENT_CLINIC_OR_DEPARTMENT_OTHER): Payer: Self-pay | Admitting: Nephrology

## 2022-01-16 DIAGNOSIS — N186 End stage renal disease: Secondary | ICD-10-CM | POA: Diagnosis not present

## 2022-01-16 DIAGNOSIS — R17 Unspecified jaundice: Secondary | ICD-10-CM | POA: Diagnosis not present

## 2022-01-16 DIAGNOSIS — Z992 Dependence on renal dialysis: Secondary | ICD-10-CM | POA: Diagnosis not present

## 2022-01-16 DIAGNOSIS — N2581 Secondary hyperparathyroidism of renal origin: Secondary | ICD-10-CM | POA: Diagnosis not present

## 2022-01-16 DIAGNOSIS — E041 Nontoxic single thyroid nodule: Secondary | ICD-10-CM

## 2022-01-16 DIAGNOSIS — D509 Iron deficiency anemia, unspecified: Secondary | ICD-10-CM | POA: Diagnosis not present

## 2022-01-16 DIAGNOSIS — Z79899 Other long term (current) drug therapy: Secondary | ICD-10-CM | POA: Diagnosis not present

## 2022-01-16 DIAGNOSIS — E440 Moderate protein-calorie malnutrition: Secondary | ICD-10-CM | POA: Diagnosis not present

## 2022-01-16 DIAGNOSIS — D631 Anemia in chronic kidney disease: Secondary | ICD-10-CM | POA: Diagnosis not present

## 2022-01-16 DIAGNOSIS — R82998 Other abnormal findings in urine: Secondary | ICD-10-CM | POA: Diagnosis not present

## 2022-01-16 DIAGNOSIS — E875 Hyperkalemia: Secondary | ICD-10-CM | POA: Diagnosis not present

## 2022-01-16 NOTE — Telephone Encounter (Signed)
Stark Bray,  This pt has ESRD on HD; their procedure will need to be performed at the hospital.  Thanks,  Osvaldo Angst

## 2022-01-17 ENCOUNTER — Encounter (HOSPITAL_COMMUNITY): Payer: Self-pay

## 2022-01-17 ENCOUNTER — Ambulatory Visit (HOSPITAL_BASED_OUTPATIENT_CLINIC_OR_DEPARTMENT_OTHER)
Admission: RE | Admit: 2022-01-17 | Discharge: 2022-01-17 | Disposition: A | Discharge status: Home / Self Care | Payer: Medicare Other | Source: Ambulatory Visit | Attending: Nephrology | Admitting: Nephrology

## 2022-01-17 ENCOUNTER — Ambulatory Visit (HOSPITAL_BASED_OUTPATIENT_CLINIC_OR_DEPARTMENT_OTHER): Payer: Self-pay

## 2022-01-17 DIAGNOSIS — E041 Nontoxic single thyroid nodule: Secondary | ICD-10-CM | POA: Insufficient documentation

## 2022-01-17 DIAGNOSIS — D509 Iron deficiency anemia, unspecified: Secondary | ICD-10-CM | POA: Diagnosis not present

## 2022-01-17 DIAGNOSIS — N2581 Secondary hyperparathyroidism of renal origin: Secondary | ICD-10-CM | POA: Diagnosis not present

## 2022-01-17 DIAGNOSIS — R82998 Other abnormal findings in urine: Secondary | ICD-10-CM | POA: Diagnosis not present

## 2022-01-17 DIAGNOSIS — N186 End stage renal disease: Secondary | ICD-10-CM | POA: Diagnosis not present

## 2022-01-17 DIAGNOSIS — Z992 Dependence on renal dialysis: Secondary | ICD-10-CM | POA: Diagnosis not present

## 2022-01-17 DIAGNOSIS — E042 Nontoxic multinodular goiter: Secondary | ICD-10-CM | POA: Diagnosis not present

## 2022-01-17 DIAGNOSIS — R17 Unspecified jaundice: Secondary | ICD-10-CM | POA: Diagnosis not present

## 2022-01-17 DIAGNOSIS — E875 Hyperkalemia: Secondary | ICD-10-CM | POA: Diagnosis not present

## 2022-01-17 DIAGNOSIS — D631 Anemia in chronic kidney disease: Secondary | ICD-10-CM | POA: Diagnosis not present

## 2022-01-17 DIAGNOSIS — Z79899 Other long term (current) drug therapy: Secondary | ICD-10-CM | POA: Diagnosis not present

## 2022-01-17 DIAGNOSIS — E440 Moderate protein-calorie malnutrition: Secondary | ICD-10-CM | POA: Diagnosis not present

## 2022-01-17 NOTE — Telephone Encounter (Signed)
Called and left a voicemail for patient to call back. Will need an office visit to discuss Colonoscopy first. Will also send him a letter since he does not have my chart.

## 2022-01-18 DIAGNOSIS — R82998 Other abnormal findings in urine: Secondary | ICD-10-CM | POA: Diagnosis not present

## 2022-01-18 DIAGNOSIS — D509 Iron deficiency anemia, unspecified: Secondary | ICD-10-CM | POA: Diagnosis not present

## 2022-01-18 DIAGNOSIS — Z79899 Other long term (current) drug therapy: Secondary | ICD-10-CM | POA: Diagnosis not present

## 2022-01-18 DIAGNOSIS — N2581 Secondary hyperparathyroidism of renal origin: Secondary | ICD-10-CM | POA: Diagnosis not present

## 2022-01-18 DIAGNOSIS — R17 Unspecified jaundice: Secondary | ICD-10-CM | POA: Diagnosis not present

## 2022-01-18 DIAGNOSIS — E875 Hyperkalemia: Secondary | ICD-10-CM | POA: Diagnosis not present

## 2022-01-18 DIAGNOSIS — D631 Anemia in chronic kidney disease: Secondary | ICD-10-CM | POA: Diagnosis not present

## 2022-01-18 DIAGNOSIS — E440 Moderate protein-calorie malnutrition: Secondary | ICD-10-CM | POA: Diagnosis not present

## 2022-01-18 DIAGNOSIS — Z992 Dependence on renal dialysis: Secondary | ICD-10-CM | POA: Diagnosis not present

## 2022-01-18 DIAGNOSIS — N186 End stage renal disease: Secondary | ICD-10-CM | POA: Diagnosis not present

## 2022-01-19 DIAGNOSIS — R82998 Other abnormal findings in urine: Secondary | ICD-10-CM | POA: Diagnosis not present

## 2022-01-19 DIAGNOSIS — R17 Unspecified jaundice: Secondary | ICD-10-CM | POA: Diagnosis not present

## 2022-01-19 DIAGNOSIS — N186 End stage renal disease: Secondary | ICD-10-CM | POA: Diagnosis not present

## 2022-01-19 DIAGNOSIS — Z79899 Other long term (current) drug therapy: Secondary | ICD-10-CM | POA: Diagnosis not present

## 2022-01-19 DIAGNOSIS — D509 Iron deficiency anemia, unspecified: Secondary | ICD-10-CM | POA: Diagnosis not present

## 2022-01-19 DIAGNOSIS — N2581 Secondary hyperparathyroidism of renal origin: Secondary | ICD-10-CM | POA: Diagnosis not present

## 2022-01-19 DIAGNOSIS — Z992 Dependence on renal dialysis: Secondary | ICD-10-CM | POA: Diagnosis not present

## 2022-01-19 DIAGNOSIS — D631 Anemia in chronic kidney disease: Secondary | ICD-10-CM | POA: Diagnosis not present

## 2022-01-19 DIAGNOSIS — E875 Hyperkalemia: Secondary | ICD-10-CM | POA: Diagnosis not present

## 2022-01-19 DIAGNOSIS — E440 Moderate protein-calorie malnutrition: Secondary | ICD-10-CM | POA: Diagnosis not present

## 2022-01-21 ENCOUNTER — Ambulatory Visit (INDEPENDENT_AMBULATORY_CARE_PROVIDER_SITE_OTHER): Payer: Medicare Other

## 2022-01-21 ENCOUNTER — Ambulatory Visit (INDEPENDENT_AMBULATORY_CARE_PROVIDER_SITE_OTHER): Payer: Medicare Other | Admitting: Orthopaedic Surgery

## 2022-01-21 ENCOUNTER — Encounter: Payer: Self-pay | Admitting: Orthopaedic Surgery

## 2022-01-21 VITALS — BP 141/92 | HR 116 | Ht 68.5 in | Wt 292.0 lb

## 2022-01-21 DIAGNOSIS — M549 Dorsalgia, unspecified: Secondary | ICD-10-CM

## 2022-01-21 NOTE — Progress Notes (Signed)
Office Visit Note   Patient: Hector Brooks           Date of Birth: 25-Mar-1970           MRN: [id number] Visit Date: 01/21/2022              Requested by: No referring provider defined for this encounter. PCP: Pcp, No   Assessment & Plan: Visit Diagnoses:  1. Mid back pain     Plan: We will proceed with  thoracic MRI imaging for the irregularities noted on radiographs obtained in [place] which I am unable to review as well as no more recent images obtained today which shows endplate spurring and some sclerotic changes of vertebral body.  This may be related to his bone changes with dialysis.  Also follow-up after thoracic MRI scan.  Follow-Up Instructions: No follow-ups on file.   Orders:  Orders Placed This Encounter  Procedures   XR Thoracic Spine 2 View   No orders of the defined types were placed in this encounter.     Procedures: No procedures performed   Clinical Data: No additional findings.   Subjective: Chief Complaint  Patient presents with   Middle Back - Pain    HPI 51 year old male on chronic dialysis for renal failure was referred here by Dr. Posey Pronto for questions about changes on plain radiographs at T8-T9 and T11-T12 concern for vertebral body erosion or discitis osteomyelitis.  Patient denies fever or chills.  He has had problems with DVT in the past had a negative Doppler and negative chest imaging in March.  He does have BMI greater than 45 prediabetes.  No myelopathic symptoms he states he has some aching in his lower back midline symmetrical right and left.  Review of Systems all other systems noncontributory to HPI.   Objective: Vital Signs: BP (!) 141/92   Pulse (!) 116   Ht 5' 8.5" (1.74 m)   Wt 292 lb (132.5 kg)   BMI 43.75 kg/m   Physical Exam Constitutional:      Appearance: He is well-developed.  HENT:     Head: Normocephalic and atraumatic.     Right Ear: External ear normal.     Left Ear: External ear normal.  Eyes:      Pupils: Pupils are equal, round, and reactive to light.  Neck:     Thyroid: No thyromegaly.     Trachea: No tracheal deviation.  Cardiovascular:     Rate and Rhythm: Normal rate.  Pulmonary:     Effort: Pulmonary effort is normal.     Breath sounds: No wheezing.  Abdominal:     General: Bowel sounds are normal.     Palpations: Abdomen is soft.  Musculoskeletal:     Cervical back: Neck supple.  Skin:    General: Skin is warm and dry.     Capillary Refill: Capillary refill takes less than 2 seconds.  Neurological:     Mental Status: He is alert and oriented to person, place, and time.  Psychiatric:        Behavior: Behavior normal.        Thought Content: Thought content normal.        Judgment: Judgment normal.     Ortho Exam dialysis shunt scars left forearm.  He gets from sitting standing easily.  Some discomfort palpation lower thoracic spine.  Strength lower extremities normal heel-toe gait no clonus.  Specialty Comments:  No specialty comments available.  Imaging: No results found.  PMFS History: Patient Active Problem List   Diagnosis Date Noted   Positive blood culture 05/10/2021   ESRD on dialysis ([place]) 05/09/2021   Sinus tachycardia 05/09/2021   Chronic deep vein thrombosis (DVT) of right lower extremity ([place]) 05/09/2021   Generalized abdominal pain    Anemia in chronic kidney disease 09/26/2019   Primary osteoarthritis of right knee 08/23/2019   Body mass index 45.0-49.9, adult (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/07/2013   History of torn meniscus of right knee    HLD (hyperlipidemia)    HTN (hypertension)    Hx of blood clots    Hyperkalemia 05/09/2021   Lower extremity edema    Metabolic acidosis 4/0/9811   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   Sleep apnea    does not use cpap   Vitamin D deficiency     Family History  Problem Relation Age of Onset   Cancer Father    Diabetes Father    Drug abuse Father    Hyperlipidemia Sister    Diabetes Maternal Grandfather    Heart disease Maternal Grandfather     Past Surgical History:  Procedure Laterality Date   AV FISTULA PLACEMENT Left 02/09/2020   Procedure: LEFT ARM FISTULA CREATION;  Surgeon: Serafina Mitchell, MD;  Location: [place];  Service: Vascular;  Laterality: Left;   INSERTION OF DIALYSIS CATHETER Right 05/22/2020   Procedure: INSERTION OF TUNNELED DIALYSIS CATHETER;  Surgeon: Angelia Mould, MD;  Location: [facility] OR;  Service: Vascular;  Laterality: Right;   MENISCUS REPAIR Right 12/14   Social History   Occupational History   Not on file  Tobacco Use   Smoking status: Never   Smokeless tobacco: Never  Vaping Use   Vaping Use: Never used  Substance and Sexual Activity   Alcohol use: No    Alcohol/week: 0.0 standard drinks of alcohol   Drug use: No   Sexual activity: Yes

## 2022-01-22 DIAGNOSIS — E440 Moderate protein-calorie malnutrition: Secondary | ICD-10-CM | POA: Diagnosis not present

## 2022-01-22 DIAGNOSIS — N186 End stage renal disease: Secondary | ICD-10-CM | POA: Diagnosis not present

## 2022-01-22 DIAGNOSIS — N2581 Secondary hyperparathyroidism of renal origin: Secondary | ICD-10-CM | POA: Diagnosis not present

## 2022-01-22 DIAGNOSIS — Z79899 Other long term (current) drug therapy: Secondary | ICD-10-CM | POA: Diagnosis not present

## 2022-01-22 DIAGNOSIS — R82998 Other abnormal findings in urine: Secondary | ICD-10-CM | POA: Diagnosis not present

## 2022-01-22 DIAGNOSIS — D509 Iron deficiency anemia, unspecified: Secondary | ICD-10-CM | POA: Diagnosis not present

## 2022-01-22 DIAGNOSIS — E875 Hyperkalemia: Secondary | ICD-10-CM | POA: Diagnosis not present

## 2022-01-22 DIAGNOSIS — Z992 Dependence on renal dialysis: Secondary | ICD-10-CM | POA: Diagnosis not present

## 2022-01-22 DIAGNOSIS — R17 Unspecified jaundice: Secondary | ICD-10-CM | POA: Diagnosis not present

## 2022-01-22 DIAGNOSIS — D631 Anemia in chronic kidney disease: Secondary | ICD-10-CM | POA: Diagnosis not present

## 2022-01-24 DIAGNOSIS — D631 Anemia in chronic kidney disease: Secondary | ICD-10-CM | POA: Diagnosis not present

## 2022-01-24 DIAGNOSIS — R82998 Other abnormal findings in urine: Secondary | ICD-10-CM | POA: Diagnosis not present

## 2022-01-24 DIAGNOSIS — R17 Unspecified jaundice: Secondary | ICD-10-CM | POA: Diagnosis not present

## 2022-01-24 DIAGNOSIS — Z992 Dependence on renal dialysis: Secondary | ICD-10-CM | POA: Diagnosis not present

## 2022-01-24 DIAGNOSIS — N186 End stage renal disease: Secondary | ICD-10-CM | POA: Diagnosis not present

## 2022-01-24 DIAGNOSIS — D509 Iron deficiency anemia, unspecified: Secondary | ICD-10-CM | POA: Diagnosis not present

## 2022-01-24 DIAGNOSIS — N2581 Secondary hyperparathyroidism of renal origin: Secondary | ICD-10-CM | POA: Diagnosis not present

## 2022-01-24 DIAGNOSIS — E440 Moderate protein-calorie malnutrition: Secondary | ICD-10-CM | POA: Diagnosis not present

## 2022-01-24 DIAGNOSIS — E875 Hyperkalemia: Secondary | ICD-10-CM | POA: Diagnosis not present

## 2022-01-24 DIAGNOSIS — Z79899 Other long term (current) drug therapy: Secondary | ICD-10-CM | POA: Diagnosis not present

## 2022-01-25 DIAGNOSIS — Z992 Dependence on renal dialysis: Secondary | ICD-10-CM | POA: Diagnosis not present

## 2022-01-25 DIAGNOSIS — E875 Hyperkalemia: Secondary | ICD-10-CM | POA: Diagnosis not present

## 2022-01-25 DIAGNOSIS — Z79899 Other long term (current) drug therapy: Secondary | ICD-10-CM | POA: Diagnosis not present

## 2022-01-25 DIAGNOSIS — D631 Anemia in chronic kidney disease: Secondary | ICD-10-CM | POA: Diagnosis not present

## 2022-01-25 DIAGNOSIS — N2581 Secondary hyperparathyroidism of renal origin: Secondary | ICD-10-CM | POA: Diagnosis not present

## 2022-01-25 DIAGNOSIS — D509 Iron deficiency anemia, unspecified: Secondary | ICD-10-CM | POA: Diagnosis not present

## 2022-01-25 DIAGNOSIS — R17 Unspecified jaundice: Secondary | ICD-10-CM | POA: Diagnosis not present

## 2022-01-25 DIAGNOSIS — E440 Moderate protein-calorie malnutrition: Secondary | ICD-10-CM | POA: Diagnosis not present

## 2022-01-25 DIAGNOSIS — R82998 Other abnormal findings in urine: Secondary | ICD-10-CM | POA: Diagnosis not present

## 2022-01-25 DIAGNOSIS — N186 End stage renal disease: Secondary | ICD-10-CM | POA: Diagnosis not present

## 2022-01-26 DIAGNOSIS — Z79899 Other long term (current) drug therapy: Secondary | ICD-10-CM | POA: Diagnosis not present

## 2022-01-26 DIAGNOSIS — N186 End stage renal disease: Secondary | ICD-10-CM | POA: Diagnosis not present

## 2022-01-26 DIAGNOSIS — R17 Unspecified jaundice: Secondary | ICD-10-CM | POA: Diagnosis not present

## 2022-01-26 DIAGNOSIS — D509 Iron deficiency anemia, unspecified: Secondary | ICD-10-CM | POA: Diagnosis not present

## 2022-01-26 DIAGNOSIS — Z992 Dependence on renal dialysis: Secondary | ICD-10-CM | POA: Diagnosis not present

## 2022-01-26 DIAGNOSIS — E440 Moderate protein-calorie malnutrition: Secondary | ICD-10-CM | POA: Diagnosis not present

## 2022-01-26 DIAGNOSIS — N2581 Secondary hyperparathyroidism of renal origin: Secondary | ICD-10-CM | POA: Diagnosis not present

## 2022-01-26 DIAGNOSIS — D631 Anemia in chronic kidney disease: Secondary | ICD-10-CM | POA: Diagnosis not present

## 2022-01-26 DIAGNOSIS — E875 Hyperkalemia: Secondary | ICD-10-CM | POA: Diagnosis not present

## 2022-01-26 DIAGNOSIS — R82998 Other abnormal findings in urine: Secondary | ICD-10-CM | POA: Diagnosis not present

## 2022-01-27 NOTE — Telephone Encounter (Signed)
Patient was scheduled for OV on 12/1 at 1:20 with Dr. Havery Moros

## 2022-01-28 DIAGNOSIS — E440 Moderate protein-calorie malnutrition: Secondary | ICD-10-CM | POA: Diagnosis not present

## 2022-01-28 DIAGNOSIS — Z992 Dependence on renal dialysis: Secondary | ICD-10-CM | POA: Diagnosis not present

## 2022-01-28 DIAGNOSIS — D631 Anemia in chronic kidney disease: Secondary | ICD-10-CM | POA: Diagnosis not present

## 2022-01-28 DIAGNOSIS — N2581 Secondary hyperparathyroidism of renal origin: Secondary | ICD-10-CM | POA: Diagnosis not present

## 2022-01-28 DIAGNOSIS — N186 End stage renal disease: Secondary | ICD-10-CM | POA: Diagnosis not present

## 2022-01-28 DIAGNOSIS — R82998 Other abnormal findings in urine: Secondary | ICD-10-CM | POA: Diagnosis not present

## 2022-01-28 DIAGNOSIS — Z79899 Other long term (current) drug therapy: Secondary | ICD-10-CM | POA: Diagnosis not present

## 2022-01-28 DIAGNOSIS — E875 Hyperkalemia: Secondary | ICD-10-CM | POA: Diagnosis not present

## 2022-01-28 DIAGNOSIS — R17 Unspecified jaundice: Secondary | ICD-10-CM | POA: Diagnosis not present

## 2022-01-28 DIAGNOSIS — D509 Iron deficiency anemia, unspecified: Secondary | ICD-10-CM | POA: Diagnosis not present

## 2022-01-29 DIAGNOSIS — R17 Unspecified jaundice: Secondary | ICD-10-CM | POA: Diagnosis not present

## 2022-01-29 DIAGNOSIS — Z79899 Other long term (current) drug therapy: Secondary | ICD-10-CM | POA: Diagnosis not present

## 2022-01-29 DIAGNOSIS — R82998 Other abnormal findings in urine: Secondary | ICD-10-CM | POA: Diagnosis not present

## 2022-01-29 DIAGNOSIS — D631 Anemia in chronic kidney disease: Secondary | ICD-10-CM | POA: Diagnosis not present

## 2022-01-29 DIAGNOSIS — E875 Hyperkalemia: Secondary | ICD-10-CM | POA: Diagnosis not present

## 2022-01-29 DIAGNOSIS — D509 Iron deficiency anemia, unspecified: Secondary | ICD-10-CM | POA: Diagnosis not present

## 2022-01-29 DIAGNOSIS — Z992 Dependence on renal dialysis: Secondary | ICD-10-CM | POA: Diagnosis not present

## 2022-01-29 DIAGNOSIS — N186 End stage renal disease: Secondary | ICD-10-CM | POA: Diagnosis not present

## 2022-01-29 DIAGNOSIS — N2581 Secondary hyperparathyroidism of renal origin: Secondary | ICD-10-CM | POA: Diagnosis not present

## 2022-01-29 DIAGNOSIS — E440 Moderate protein-calorie malnutrition: Secondary | ICD-10-CM | POA: Diagnosis not present

## 2022-01-30 DIAGNOSIS — N2581 Secondary hyperparathyroidism of renal origin: Secondary | ICD-10-CM | POA: Diagnosis not present

## 2022-01-30 DIAGNOSIS — E440 Moderate protein-calorie malnutrition: Secondary | ICD-10-CM | POA: Diagnosis not present

## 2022-01-30 DIAGNOSIS — E875 Hyperkalemia: Secondary | ICD-10-CM | POA: Diagnosis not present

## 2022-01-30 DIAGNOSIS — R17 Unspecified jaundice: Secondary | ICD-10-CM | POA: Diagnosis not present

## 2022-01-30 DIAGNOSIS — R82998 Other abnormal findings in urine: Secondary | ICD-10-CM | POA: Diagnosis not present

## 2022-01-30 DIAGNOSIS — D631 Anemia in chronic kidney disease: Secondary | ICD-10-CM | POA: Diagnosis not present

## 2022-01-30 DIAGNOSIS — N186 End stage renal disease: Secondary | ICD-10-CM | POA: Diagnosis not present

## 2022-01-30 DIAGNOSIS — Z79899 Other long term (current) drug therapy: Secondary | ICD-10-CM | POA: Diagnosis not present

## 2022-01-30 DIAGNOSIS — D509 Iron deficiency anemia, unspecified: Secondary | ICD-10-CM | POA: Diagnosis not present

## 2022-01-30 DIAGNOSIS — Z992 Dependence on renal dialysis: Secondary | ICD-10-CM | POA: Diagnosis not present

## 2022-01-31 ENCOUNTER — Ambulatory Visit (INDEPENDENT_AMBULATORY_CARE_PROVIDER_SITE_OTHER): Payer: Medicare Other | Admitting: Gastroenterology

## 2022-01-31 ENCOUNTER — Encounter: Payer: Self-pay | Admitting: Gastroenterology

## 2022-01-31 VITALS — BP 130/86 | HR 90 | Ht 69.0 in | Wt 288.6 lb

## 2022-01-31 DIAGNOSIS — N186 End stage renal disease: Secondary | ICD-10-CM

## 2022-01-31 DIAGNOSIS — Z1211 Encounter for screening for malignant neoplasm of colon: Secondary | ICD-10-CM | POA: Diagnosis not present

## 2022-01-31 DIAGNOSIS — Z01818 Encounter for other preprocedural examination: Secondary | ICD-10-CM | POA: Diagnosis not present

## 2022-01-31 MED ORDER — SUTAB 1479-225-188 MG PO TABS: 1.0000 | ORAL_TABLET | Freq: Once | ORAL | 0 refills | Status: AC

## 2022-01-31 NOTE — H&P (View-Only) (Signed)
HPI :  51 year old male with a history of end-stage renal disease secondary to hypertension, now on dialysis, history of DVT, sleep apnea, here for new patient visit to discuss screening colonoscopy.  He is on a wait list for a kidney transplant, he has never had a colonoscopy before and that is part of the transplant evaluation.  He denies any problems with his bowels, states he has regular bowel habits.  No blood in his stools.  No abdominal pains.  He denies any family history of colon cancer.  He has dialysis before days out of 7, sets his own schedule and when he wants to do that.  He eats well, no problems with his stomach, no nausea or vomiting.  He reports a prior history of blood clots in the past related to surgery, denies any blood thinners at this point time.  He has suspected OSA however was not able to complete this testing for OSA.  He denies any cardiopulmonary symptoms.  Denies any history of significant cardiac problems.   Echocardiogram 02/08/2019: EF 60-65%, grade II DD   CT Scan abdomen pelvis 05/08/21: IMPRESSION: 1. Bilateral nonspecific perinephric inflammatory fat stranding. While this may be chronic in nature, correlation with urinalysis is recommended to exclude the presence of an acute pyelonephritis. 2. Sigmoid diverticulosis.    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   Sleep apnea    does not use cpap   Vitamin D deficiency      Past Surgical  History:  Procedure Laterality Date   AV FISTULA PLACEMENT Left 02/09/2020   Procedure: LEFT ARM FISTULA CREATION;  Surgeon: Serafina Mitchell, MD;  Location: [facility] OR;  Service: Vascular;  Laterality: Left;   INSERTION OF DIALYSIS CATHETER Right 05/22/2020   Procedure: INSERTION OF TUNNELED DIALYSIS CATHETER;  Surgeon: Angelia Mould, MD;  Location: [facility] OR;  Service: Vascular;  Laterality: Right;   MENISCUS REPAIR Right 12/14   Family History  Problem Relation Age of Onset   Prostate cancer Father    Diabetes Father    Drug abuse Father    Hyperlipidemia Sister    Stomach cancer Maternal Grandmother    Diabetes Maternal Grandfather    Heart disease Maternal Grandfather    Social History   Tobacco Use   Smoking status: Never   Smokeless tobacco: Never  Vaping Use   Vaping Use: Never used  Substance Use Topics   Alcohol use: No    Alcohol/week: 0.0 standard drinks of alcohol   Drug use: No   Current Outpatient Medications  Medication Sig Dispense Refill   atorvastatin (LIPITOR) 80 MG tablet TAKE 1/2 TABLET BY MOUTH DAILY 45 tablet 1   calcitRIOL (ROCALTROL) 0.25 MCG capsule Take 0.25 mcg by mouth daily.     calcium acetate (PHOSLO) 667 MG capsule Take 1,334 mg by mouth 3 (three) times daily. With a meal     hydrALAZINE (  APRESOLINE) 25 MG tablet Take 50 mg by mouth 2 (two) times daily.     metoprolol tartrate (LOPRESSOR) 100 MG tablet Take 1 tablet (100 mg total) by mouth 2 (two) times daily. 60 tablet 1   Sodium Sulfate-Mag Sulfate-KCl (SUTAB) [phone] MG TABS Take 1 kit by mouth once for 1 dose. 24 tablet 0   NIFEdipine (PROCARDIA XL/NIFEDICAL XL) 60 MG 24 hr tablet Take by mouth.     No current facility-administered medications for this visit.   No Known Allergies   Review of Systems: All systems reviewed and negative except where noted in HPI.    Lab Results  Component Value Date   WBC 10.1 05/11/2021   HGB 8.4 (L) 05/11/2021   HCT 27.4 (L) 05/11/2021    MCV 92.9 05/11/2021   PLT 179 05/11/2021    Lab Results  Component Value Date   CREATININE 13.06 (H) 05/12/2021   BUN 82 (H) 05/12/2021   NA 136 05/12/2021   K 4.9 05/12/2021   CL 98 05/12/2021   CO2 25 05/12/2021    Lab Results  Component Value Date   ALT 8 05/08/2021   AST 10 (L) 05/08/2021   ALKPHOS 38 05/08/2021   BILITOT 0.9 05/08/2021    Physical Exam: BP 130/86   Pulse 90 Comment: manual repeat during physical exam  Ht _0  (1.753 m)   Wt 288 lb 9.6 oz (130.9 kg)   BMI 42.62 kg/m  Constitutional: Pleasant,well-developed, male in no acute distress. HEENT: Normocephalic and atraumatic. No scleral icterus. Neck supple.  Cardiovascular: Normal rate, regular rhythm.  Pulmonary/chest: Effort normal and breath sounds normal.  Abdominal: Soft, nondistended, protuberant, nontender.  There are no masses palpable. . Extremities: some mild LE edema Lymphadenopathy: No cervical adenopathy noted. Neurological: Alert and oriented to person place and time. Skin: Skin is warm and dry. No rashes noted. Psychiatric: Normal mood and affect. Behavior is normal.   ASSESSMENT: 51 y.o. male here for assessment of the following  1. Colon cancer screening   2. End stage renal disease ([place])    Patient ESRD on HD, awaiting renal transplant on wait list, in need of routine colon cancer screening.  He is asymptomatic from a lower GI tract perspective.  He has chronic anemia likely related to his renal disease.  I discussed colonoscopy screening with him, what it entails, risks / benefits of the procedure and anesthesia.  Given he has end-stage renal disease and is on dialysis, he is considered higher than average risk for anesthesia in his case would need to be done in the hospital for anesthesia support.  Following discussion of risks and benefits and the procedure, he wants to proceed, was able to add him onto the schedule in a few weeks at [facility].  Further recommendations pending the  results.  Counseled on bowel prep instructions etc.  Jolly Mango, MD [facility]

## 2022-01-31 NOTE — Progress Notes (Signed)
 HPI :  51-year-old male with a history of end-stage renal disease secondary to hypertension, now on dialysis, history of DVT, sleep apnea, here for new patient visit to discuss screening colonoscopy.  He is on a wait list for a kidney transplant, he has never had a colonoscopy before and that is part of the transplant evaluation.  He denies any problems with his bowels, states he has regular bowel habits.  No blood in his stools.  No abdominal pains.  He denies any family history of colon cancer.  He has dialysis before days out of 7, sets his own schedule and when he wants to do that.  He eats well, no problems with his stomach, no nausea or vomiting.  He reports a prior history of blood clots in the past related to surgery, denies any blood thinners at this point time.  He has suspected OSA however was not able to complete this testing for OSA.  He denies any cardiopulmonary symptoms.  Denies any history of significant cardiac problems.   Echocardiogram 02/08/2019: EF 60-65%, grade II DD   CT Scan abdomen pelvis 05/08/21: IMPRESSION: 1. Bilateral nonspecific perinephric inflammatory fat stranding. While this may be chronic in nature, correlation with urinalysis is recommended to exclude the presence of an acute pyelonephritis. 2. Sigmoid diverticulosis.    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   Sleep apnea    does not use cpap   Vitamin D deficiency      Past Surgical  History:  Procedure Laterality Date   AV FISTULA PLACEMENT Left 02/09/2020   Procedure: LEFT ARM FISTULA CREATION;  Surgeon: Brabham, Vance W, MD;  Location: [facility] OR;  Service: Vascular;  Laterality: Left;   INSERTION OF DIALYSIS CATHETER Right 05/22/2020   Procedure: INSERTION OF TUNNELED DIALYSIS CATHETER;  Surgeon: Dickson, Christopher S, MD;  Location: [facility] OR;  Service: Vascular;  Laterality: Right;   MENISCUS REPAIR Right 12/14   Family History  Problem Relation Age of Onset   Prostate cancer Father    Diabetes Father    Drug abuse Father    Hyperlipidemia Sister    Stomach cancer Maternal Grandmother    Diabetes Maternal Grandfather    Heart disease Maternal Grandfather    Social History   Tobacco Use   Smoking status: Never   Smokeless tobacco: Never  Vaping Use   Vaping Use: Never used  Substance Use Topics   Alcohol use: No    Alcohol/week: 0.0 standard drinks of alcohol   Drug use: No   Current Outpatient Medications  Medication Sig Dispense Refill   atorvastatin (LIPITOR) 80 MG tablet TAKE 1/2 TABLET BY MOUTH DAILY 45 tablet 1   calcitRIOL (ROCALTROL) 0.25 MCG capsule Take 0.25 mcg by mouth daily.     calcium acetate (PHOSLO) 667 MG capsule Take 1,334 mg by mouth 3 (three) times daily. With a meal     hydrALAZINE (  APRESOLINE) 25 MG tablet Take 50 mg by mouth 2 (two) times daily.     metoprolol tartrate (LOPRESSOR) 100 MG tablet Take 1 tablet (100 mg total) by mouth 2 (two) times daily. 60 tablet 1   Sodium Sulfate-Mag Sulfate-KCl (SUTAB) [phone] MG TABS Take 1 kit by mouth once for 1 dose. 24 tablet 0   NIFEdipine (PROCARDIA XL/NIFEDICAL XL) 60 MG 24 hr tablet Take by mouth.     No current facility-administered medications for this visit.   No Known Allergies   Review of Systems: All systems reviewed and negative except where noted in HPI.    Lab Results  Component Value Date   WBC 10.1 05/11/2021   HGB 8.4 (L) 05/11/2021   HCT 27.4 (L) 05/11/2021    MCV 92.9 05/11/2021   PLT 179 05/11/2021    Lab Results  Component Value Date   CREATININE 13.06 (H) 05/12/2021   BUN 82 (H) 05/12/2021   NA 136 05/12/2021   K 4.9 05/12/2021   CL 98 05/12/2021   CO2 25 05/12/2021    Lab Results  Component Value Date   ALT 8 05/08/2021   AST 10 (L) 05/08/2021   ALKPHOS 38 05/08/2021   BILITOT 0.9 05/08/2021    Physical Exam: BP 130/86   Pulse 90 Comment: manual repeat during physical exam  Ht 5' 9" (1.753 m)   Wt 288 lb 9.6 oz (130.9 kg)   BMI 42.62 kg/m  Constitutional: Pleasant,well-developed, male in no acute distress. HEENT: Normocephalic and atraumatic. No scleral icterus. Neck supple.  Cardiovascular: Normal rate, regular rhythm.  Pulmonary/chest: Effort normal and breath sounds normal.  Abdominal: Soft, nondistended, protuberant, nontender.  There are no masses palpable. . Extremities: some mild LE edema Lymphadenopathy: No cervical adenopathy noted. Neurological: Alert and oriented to person place and time. Skin: Skin is warm and dry. No rashes noted. Psychiatric: Normal mood and affect. Behavior is normal.   ASSESSMENT: 51 y.o. male here for assessment of the following  1. Colon cancer screening   2. End stage renal disease ([facility])    Patient ESRD on HD, awaiting renal transplant on wait list, in need of routine colon cancer screening.  He is asymptomatic from a lower GI tract perspective.  He has chronic anemia likely related to his renal disease.  I discussed colonoscopy screening with him, what it entails, risks / benefits of the procedure and anesthesia.  Given he has end-stage renal disease and is on dialysis, he is considered higher than average risk for anesthesia in his case would need to be done in the hospital for anesthesia support.  Following discussion of risks and benefits and the procedure, he wants to proceed, was able to add him onto the schedule in a few weeks at [place].  Further recommendations pending the  results.  Counseled on bowel prep instructions etc.  Steve Jermari Tamargo, MD [facility] 

## 2022-01-31 NOTE — Patient Instructions (Addendum)
If you are age 49 or older, your body mass index should be between 23-30. Your Body mass index is 42.62 kg/m. If this is out of the aforementioned range listed, please consider follow up with your Primary Care Provider.  If you are age 5 or younger, your body mass index should be between 19-25. Your Body mass index is 42.62 kg/m. If this is out of the aformentioned range listed, please consider follow up with your Primary Care Provider.   ________________________________________________________   Hector Brooks have been scheduled for a colonoscopy at [facility] on 12-11. Please follow written instructions given to you at your visit today.  Please pick up your prep supplies at the pharmacy within the next 1-3 days. If you use inhalers (even only as needed), please bring them with you on the day of your procedure.  Thank you for entrusting me with your care and for choosing [facility], Dr. [place] Cellar

## 2022-02-01 ENCOUNTER — Encounter: Payer: Federal, State, Local not specified - PPO | Admitting: Gastroenterology

## 2022-02-01 DIAGNOSIS — N2589 Other disorders resulting from impaired renal tubular function: Secondary | ICD-10-CM | POA: Diagnosis not present

## 2022-02-01 DIAGNOSIS — R82998 Other abnormal findings in urine: Secondary | ICD-10-CM | POA: Diagnosis not present

## 2022-02-01 DIAGNOSIS — N186 End stage renal disease: Secondary | ICD-10-CM | POA: Diagnosis not present

## 2022-02-01 DIAGNOSIS — D509 Iron deficiency anemia, unspecified: Secondary | ICD-10-CM | POA: Diagnosis not present

## 2022-02-01 DIAGNOSIS — Z992 Dependence on renal dialysis: Secondary | ICD-10-CM | POA: Diagnosis not present

## 2022-02-01 DIAGNOSIS — E875 Hyperkalemia: Secondary | ICD-10-CM | POA: Diagnosis not present

## 2022-02-01 DIAGNOSIS — D631 Anemia in chronic kidney disease: Secondary | ICD-10-CM | POA: Diagnosis not present

## 2022-02-01 DIAGNOSIS — N2581 Secondary hyperparathyroidism of renal origin: Secondary | ICD-10-CM | POA: Diagnosis not present

## 2022-02-02 DIAGNOSIS — D631 Anemia in chronic kidney disease: Secondary | ICD-10-CM | POA: Diagnosis not present

## 2022-02-02 DIAGNOSIS — R82998 Other abnormal findings in urine: Secondary | ICD-10-CM | POA: Diagnosis not present

## 2022-02-02 DIAGNOSIS — N2581 Secondary hyperparathyroidism of renal origin: Secondary | ICD-10-CM | POA: Diagnosis not present

## 2022-02-02 DIAGNOSIS — D509 Iron deficiency anemia, unspecified: Secondary | ICD-10-CM | POA: Diagnosis not present

## 2022-02-02 DIAGNOSIS — N186 End stage renal disease: Secondary | ICD-10-CM | POA: Diagnosis not present

## 2022-02-02 DIAGNOSIS — E875 Hyperkalemia: Secondary | ICD-10-CM | POA: Diagnosis not present

## 2022-02-02 DIAGNOSIS — N2589 Other disorders resulting from impaired renal tubular function: Secondary | ICD-10-CM | POA: Diagnosis not present

## 2022-02-02 DIAGNOSIS — Z992 Dependence on renal dialysis: Secondary | ICD-10-CM | POA: Diagnosis not present

## 2022-02-03 ENCOUNTER — Other Ambulatory Visit: Payer: Self-pay | Admitting: *Deleted

## 2022-02-03 DIAGNOSIS — D631 Anemia in chronic kidney disease: Secondary | ICD-10-CM | POA: Diagnosis not present

## 2022-02-03 DIAGNOSIS — N186 End stage renal disease: Secondary | ICD-10-CM | POA: Diagnosis not present

## 2022-02-03 DIAGNOSIS — E875 Hyperkalemia: Secondary | ICD-10-CM | POA: Diagnosis not present

## 2022-02-03 DIAGNOSIS — I825Y1 Chronic embolism and thrombosis of unspecified deep veins of right proximal lower extremity: Secondary | ICD-10-CM

## 2022-02-03 DIAGNOSIS — R82998 Other abnormal findings in urine: Secondary | ICD-10-CM | POA: Diagnosis not present

## 2022-02-03 DIAGNOSIS — N2589 Other disorders resulting from impaired renal tubular function: Secondary | ICD-10-CM | POA: Diagnosis not present

## 2022-02-03 DIAGNOSIS — Z992 Dependence on renal dialysis: Secondary | ICD-10-CM | POA: Diagnosis not present

## 2022-02-03 DIAGNOSIS — N2581 Secondary hyperparathyroidism of renal origin: Secondary | ICD-10-CM | POA: Diagnosis not present

## 2022-02-03 DIAGNOSIS — D509 Iron deficiency anemia, unspecified: Secondary | ICD-10-CM | POA: Diagnosis not present

## 2022-02-04 ENCOUNTER — Telehealth: Payer: Self-pay | Admitting: Orthopaedic Surgery

## 2022-02-04 MED ORDER — DIAZEPAM 5 MG PO TABS: ORAL_TABLET | ORAL | 0 refills | Status: DC

## 2022-02-04 NOTE — Telephone Encounter (Signed)
I called patient and advised, rx called to pharmacy. Went over directions for taking medication prior to scan.

## 2022-02-04 NOTE — Telephone Encounter (Signed)
Sharon for valium #3?

## 2022-02-04 NOTE — Telephone Encounter (Signed)
Pt called requesting medication for upcoming MRI on 12/16. Please send valium to keep pt calm at [facility] on [address]. Pt asked for a call when medication has been sent in. Pt phone number is [phone]

## 2022-02-05 DIAGNOSIS — N2589 Other disorders resulting from impaired renal tubular function: Secondary | ICD-10-CM | POA: Diagnosis not present

## 2022-02-05 DIAGNOSIS — R82998 Other abnormal findings in urine: Secondary | ICD-10-CM | POA: Diagnosis not present

## 2022-02-05 DIAGNOSIS — D631 Anemia in chronic kidney disease: Secondary | ICD-10-CM | POA: Diagnosis not present

## 2022-02-05 DIAGNOSIS — D509 Iron deficiency anemia, unspecified: Secondary | ICD-10-CM | POA: Diagnosis not present

## 2022-02-05 DIAGNOSIS — N2581 Secondary hyperparathyroidism of renal origin: Secondary | ICD-10-CM | POA: Diagnosis not present

## 2022-02-05 DIAGNOSIS — E875 Hyperkalemia: Secondary | ICD-10-CM | POA: Diagnosis not present

## 2022-02-05 DIAGNOSIS — N186 End stage renal disease: Secondary | ICD-10-CM | POA: Diagnosis not present

## 2022-02-05 DIAGNOSIS — Z992 Dependence on renal dialysis: Secondary | ICD-10-CM | POA: Diagnosis not present

## 2022-02-07 DIAGNOSIS — D509 Iron deficiency anemia, unspecified: Secondary | ICD-10-CM | POA: Diagnosis not present

## 2022-02-07 DIAGNOSIS — E875 Hyperkalemia: Secondary | ICD-10-CM | POA: Diagnosis not present

## 2022-02-07 DIAGNOSIS — R82998 Other abnormal findings in urine: Secondary | ICD-10-CM | POA: Diagnosis not present

## 2022-02-07 DIAGNOSIS — Z992 Dependence on renal dialysis: Secondary | ICD-10-CM | POA: Diagnosis not present

## 2022-02-07 DIAGNOSIS — N2581 Secondary hyperparathyroidism of renal origin: Secondary | ICD-10-CM | POA: Diagnosis not present

## 2022-02-07 DIAGNOSIS — N2589 Other disorders resulting from impaired renal tubular function: Secondary | ICD-10-CM | POA: Diagnosis not present

## 2022-02-07 DIAGNOSIS — N186 End stage renal disease: Secondary | ICD-10-CM | POA: Diagnosis not present

## 2022-02-07 DIAGNOSIS — D631 Anemia in chronic kidney disease: Secondary | ICD-10-CM | POA: Diagnosis not present

## 2022-02-08 DIAGNOSIS — E875 Hyperkalemia: Secondary | ICD-10-CM | POA: Diagnosis not present

## 2022-02-08 DIAGNOSIS — N186 End stage renal disease: Secondary | ICD-10-CM | POA: Diagnosis not present

## 2022-02-08 DIAGNOSIS — D631 Anemia in chronic kidney disease: Secondary | ICD-10-CM | POA: Diagnosis not present

## 2022-02-08 DIAGNOSIS — N2581 Secondary hyperparathyroidism of renal origin: Secondary | ICD-10-CM | POA: Diagnosis not present

## 2022-02-08 DIAGNOSIS — D509 Iron deficiency anemia, unspecified: Secondary | ICD-10-CM | POA: Diagnosis not present

## 2022-02-08 DIAGNOSIS — Z992 Dependence on renal dialysis: Secondary | ICD-10-CM | POA: Diagnosis not present

## 2022-02-08 DIAGNOSIS — R82998 Other abnormal findings in urine: Secondary | ICD-10-CM | POA: Diagnosis not present

## 2022-02-08 DIAGNOSIS — N2589 Other disorders resulting from impaired renal tubular function: Secondary | ICD-10-CM | POA: Diagnosis not present

## 2022-02-09 NOTE — Anesthesia Preprocedure Evaluation (Signed)
Anesthesia Evaluation  Patient identified by MRN, date of birth, ID band Patient awake    Reviewed: Allergy & Precautions, NPO status , Patient's Chart, lab work & pertinent test results  Airway Mallampati: II  TM Distance: >3 FB Neck ROM: Full    Dental  (+) Teeth Intact, Dental Advisory Given   Pulmonary sleep apnea (no CPAP)    Pulmonary exam normal breath sounds clear to auscultation       Cardiovascular hypertension, + Peripheral Vascular Disease and + DVT  Normal cardiovascular exam Rhythm:Regular Rate:Normal  TTE 2020  1. Left ventricular ejection fraction, by visual estimation, is 60 to  65%. The left ventricle has normal function. There is moderately increased  left ventricular hypertrophy.   2. Elevated left atrial and left ventricular end-diastolic pressures.   3. Left ventricular diastolic parameters are consistent with Grade II  diastolic dysfunction (pseudonormalization).   4. Global right ventricle has normal systolic function.The right  ventricular size is normal. No increase in right ventricular wall  thickness.   5. Left atrial size was normal.   6. Moderate sized left atrial thrombus.   7. Right atrial size was normal.   8. The mitral valve is normal in structure. No evidence of mitral valve  regurgitation. No evidence of mitral stenosis.   9. The tricuspid valve is normal in structure. Tricuspid valve  regurgitation is not demonstrated.  10. The aortic valve is normal in structure. Aortic valve regurgitation is  not visualized. No evidence of aortic valve sclerosis or stenosis.  11. The pulmonic valve was normal in structure. Pulmonic valve  regurgitation is not visualized.  12. The inferior vena cava is normal in size with greater than 50%  respiratory variability, suggesting right atrial pressure of 3 mmHg.  13. There is no evidence for a hypertrophic cardiomyopathy but rather  hypertensive heart  disease.      Neuro/Psych negative neurological ROS  negative psych ROS   GI/Hepatic negative GI ROS, Neg liver ROS,,,  Endo/Other    Morbid obesity (BMI 42)  Renal/GU ESRF and DialysisRenal disease (dialysis at home 4 days a week, last dialysis on saturday)  negative genitourinary   Musculoskeletal  (+) Arthritis ,    Abdominal   Peds  Hematology  (+) Blood dyscrasia, anemia   Anesthesia Other Findings   Reproductive/Obstetrics                             Anesthesia Physical Anesthesia Plan  ASA: 3  Anesthesia Plan: MAC   Post-op Pain Management:    Induction: Intravenous  PONV Risk Score and Plan: Propofol infusion and Treatment may vary due to age or medical condition  Airway Management Planned: Natural Airway  Additional Equipment:   Intra-op Plan:   Post-operative Plan:   Informed Consent: I have reviewed the patients History and Physical, chart, labs and discussed the procedure including the risks, benefits and alternatives for the proposed anesthesia with the patient or authorized representative who has indicated his/her understanding and acceptance.     Dental advisory given  Plan Discussed with: CRNA  Anesthesia Plan Comments:        Anesthesia Quick Evaluation

## 2022-02-10 ENCOUNTER — Other Ambulatory Visit: Payer: Self-pay

## 2022-02-10 ENCOUNTER — Encounter (HOSPITAL_COMMUNITY)
Admission: RE | Disposition: A | Discharge status: Home / Self Care | Payer: Self-pay | Source: Ambulatory Visit | Attending: Gastroenterology

## 2022-02-10 ENCOUNTER — Encounter (HOSPITAL_COMMUNITY): Payer: Medicare Other

## 2022-02-10 ENCOUNTER — Ambulatory Visit (HOSPITAL_COMMUNITY): Payer: Medicare Other | Admitting: Anesthesiology

## 2022-02-10 ENCOUNTER — Encounter (HOSPITAL_COMMUNITY): Payer: Self-pay | Admitting: Gastroenterology

## 2022-02-10 ENCOUNTER — Ambulatory Visit (HOSPITAL_COMMUNITY)
Admission: RE | Admit: 2022-02-10 | Discharge: 2022-02-10 | Disposition: A | Discharge status: Home / Self Care | Payer: Medicare Other | Source: Ambulatory Visit | Attending: Gastroenterology | Admitting: Gastroenterology

## 2022-02-10 ENCOUNTER — Ambulatory Visit (HOSPITAL_BASED_OUTPATIENT_CLINIC_OR_DEPARTMENT_OTHER): Payer: Medicare Other | Admitting: Anesthesiology

## 2022-02-10 ENCOUNTER — Ambulatory Visit: Payer: Medicare Other | Admitting: Surgery

## 2022-02-10 DIAGNOSIS — K5730 Diverticulosis of large intestine without perforation or abscess without bleeding: Secondary | ICD-10-CM

## 2022-02-10 DIAGNOSIS — G4730 Sleep apnea, unspecified: Secondary | ICD-10-CM | POA: Diagnosis not present

## 2022-02-10 DIAGNOSIS — Z992 Dependence on renal dialysis: Secondary | ICD-10-CM | POA: Diagnosis not present

## 2022-02-10 DIAGNOSIS — D126 Benign neoplasm of colon, unspecified: Secondary | ICD-10-CM

## 2022-02-10 DIAGNOSIS — N186 End stage renal disease: Secondary | ICD-10-CM | POA: Insufficient documentation

## 2022-02-10 DIAGNOSIS — K648 Other hemorrhoids: Secondary | ICD-10-CM | POA: Insufficient documentation

## 2022-02-10 DIAGNOSIS — I120 Hypertensive chronic kidney disease with stage 5 chronic kidney disease or end stage renal disease: Secondary | ICD-10-CM | POA: Diagnosis not present

## 2022-02-10 DIAGNOSIS — D631 Anemia in chronic kidney disease: Secondary | ICD-10-CM | POA: Diagnosis not present

## 2022-02-10 DIAGNOSIS — D120 Benign neoplasm of cecum: Secondary | ICD-10-CM | POA: Insufficient documentation

## 2022-02-10 DIAGNOSIS — Z86718 Personal history of other venous thrombosis and embolism: Secondary | ICD-10-CM | POA: Diagnosis not present

## 2022-02-10 DIAGNOSIS — Z6841 Body Mass Index (BMI) 40.0 and over, adult: Secondary | ICD-10-CM | POA: Insufficient documentation

## 2022-02-10 DIAGNOSIS — K635 Polyp of colon: Secondary | ICD-10-CM | POA: Diagnosis not present

## 2022-02-10 DIAGNOSIS — Z1211 Encounter for screening for malignant neoplasm of colon: Secondary | ICD-10-CM

## 2022-02-10 HISTORY — PX: COLONOSCOPY WITH PROPOFOL: SHX5780

## 2022-02-10 HISTORY — PX: POLYPECTOMY: SHX5525

## 2022-02-10 LAB — POCT I-STAT, CHEM 8
BUN: 84 mg/dL — ABNORMAL HIGH (ref 6–20)
Calcium, Ion: 1.14 mmol/L — ABNORMAL LOW (ref 1.15–1.40)
Chloride: 101 mmol/L (ref 98–111)
Creatinine, Ser: 14.3 mg/dL — ABNORMAL HIGH (ref 0.61–1.24)
Glucose, Bld: 73 mg/dL (ref 70–99)
HCT: 29 % — ABNORMAL LOW (ref 39.0–52.0)
Hemoglobin: 9.9 g/dL — ABNORMAL LOW (ref 13.0–17.0)
Potassium: 4.9 mmol/L (ref 3.5–5.1)
Sodium: 137 mmol/L (ref 135–145)
TCO2: 25 mmol/L (ref 22–32)

## 2022-02-10 SURGERY — COLONOSCOPY WITH PROPOFOL
Anesthesia: Monitor Anesthesia Care

## 2022-02-10 MED ORDER — PROPOFOL 500 MG/50ML IV EMUL
INTRAVENOUS | Status: DC | PRN
  Administered 2022-02-10: 100 ug/kg/min via INTRAVENOUS

## 2022-02-10 MED ORDER — SODIUM CHLORIDE 0.9 % IV SOLN: INTRAVENOUS | Status: DC

## 2022-02-10 MED ORDER — PROPOFOL 10 MG/ML IV BOLUS
INTRAVENOUS | Status: DC | PRN
  Administered 2022-02-10: 10 mg via INTRAVENOUS
  Administered 2022-02-10: 30 mg via INTRAVENOUS
  Administered 2022-02-10: 10 mg via INTRAVENOUS

## 2022-02-10 SURGICAL SUPPLY — 22 items

## 2022-02-10 NOTE — Anesthesia Postprocedure Evaluation (Signed)
Anesthesia Post Note  Patient: Hector Brooks  Procedure(s) Performed: COLONOSCOPY WITH PROPOFOL POLYPECTOMY     Patient location during evaluation: Endoscopy Anesthesia Type: MAC Level of consciousness: awake and alert Pain management: pain level controlled Vital Signs Assessment: post-procedure vital signs reviewed and stable Respiratory status: spontaneous breathing, nonlabored ventilation, respiratory function stable and patient connected to nasal cannula oxygen Cardiovascular status: blood pressure returned to baseline and stable Postop Assessment: no apparent nausea or vomiting Anesthetic complications: no  No notable events documented.  Last Vitals:  Vitals:   02/10/22 0915 02/10/22 0930  BP: (!) 141/69 (!) 163/83  Pulse: 91 80  Resp: (!) 23 (!) 21  Temp: (!) 36.2 C (!) 36.2 C  SpO2: 95% 95%    Last Pain:  Vitals:   02/10/22 0930  TempSrc:   PainSc: 0-No pain                 Harshita Bernales L Kaliann Coryell

## 2022-02-10 NOTE — Anesthesia Procedure Notes (Signed)
Procedure Name: MAC Date/Time: 02/10/2022 8:41 AM  Performed by: Carolan Clines, CRNAPre-anesthesia Checklist: Patient identified, Emergency Drugs available, Suction available and Patient being monitored Patient Re-evaluated:Patient Re-evaluated prior to induction Oxygen Delivery Method: Simple face mask Dental Injury: Teeth and Oropharynx as per pre-operative assessment

## 2022-02-10 NOTE — Interval H&P Note (Signed)
History and Physical Interval Note: Patient seen on 01/31/22 - no interval changes. He feels well today without complaints. Here for first colonoscopy, case done with anesthesia support at the hospital given his history of ESRD on HD. Have discussed risks / benefits, he wishes to proceed. All questions answered.   02/10/2022 7:17 AM  Diamantina Providence  has presented today for surgery, with the diagnosis of colon ca screening, ESRD.  The various methods of treatment have been discussed with the patient and family. After consideration of risks, benefits and other options for treatment, the patient has consented to  Procedure(s): COLONOSCOPY WITH PROPOFOL (N/A) as a surgical intervention.  The patient's history has been reviewed, patient examined, no change in status, stable for surgery.  I have reviewed the patient's chart and labs.  Questions were answered to the patient's satisfaction.     [place]

## 2022-02-10 NOTE — Transfer of Care (Signed)
Immediate Anesthesia Transfer of Care Note  Patient: Hector Brooks  Procedure(s) Performed: COLONOSCOPY WITH PROPOFOL POLYPECTOMY  Patient Location: PACU  Anesthesia Type:MAC  Level of Consciousness: awake, alert , and oriented  Airway & Oxygen Therapy: Patient Spontanous Breathing and Patient connected to face mask oxygen  Post-op Assessment: Report given to RN and Post -op Vital signs reviewed and stable  Post vital signs: Reviewed and stable  Last Vitals:  Vitals Value Taken Time  BP 141/69 02/10/22 0915  Temp 36.2 C 02/10/22 0915  Pulse 86 02/10/22 0919  Resp 22 02/10/22 0919  SpO2 100 % 02/10/22 0919  Vitals shown include unvalidated device data.  Last Pain:  Vitals:   02/10/22 0915  TempSrc:   PainSc: 0-No pain         Complications: No notable events documented.

## 2022-02-10 NOTE — Op Note (Signed)
[facility] Patient Name: Hector Brooks Procedure Date : 02/10/2022 MRN: [id number] Attending MD: Carlota Raspberry. Havery Moros , MD, [id number] Date of Birth: 22-Jun-1970 CSN: [id number] Age: 51 Admit Type: Outpatient Procedure:                Colonoscopy Indications:              Screening for colorectal malignant neoplasm, This                            is the patient's first colonoscopy Providers:                Carlota Raspberry. Havery Moros, MD, William Dalton,                            Technician, Benay Pillow, RN Referring MD:              Medicines:                Monitored Anesthesia Care Complications:            No immediate complications. Estimated blood loss:                            Minimal. Estimated Blood Loss:     Estimated blood loss was minimal. Procedure:                Pre-Anesthesia Assessment:                           - Prior to the procedure, a History and Physical                            was performed, and patient medications and                            allergies were reviewed. The patient's tolerance of                            previous anesthesia was also reviewed. The risks                            and benefits of the procedure and the sedation                            options and risks were discussed with the patient.                            All questions were answered, and informed consent                            was obtained. Prior Anticoagulants: The patient has                            taken no anticoagulant or antiplatelet agents. ASA  Grade Assessment: III - A patient with severe                            systemic disease. After reviewing the risks and                            benefits, the patient was deemed in satisfactory                            condition to undergo the procedure.                           After obtaining informed consent, the colonoscope                            was  passed under direct vision. Throughout the                            procedure, the patient's blood pressure, pulse, and                            oxygen saturations were monitored continuously. The                            CF-HQ190L ([id number]) Olympus colonoscope was                            introduced through the anus and advanced to the the                            cecum, identified by appendiceal orifice and                            ileocecal valve. The colonoscopy was performed                            without difficulty. The patient tolerated the                            procedure well. The quality of the bowel                            preparation was adequate. The ileocecal valve,                            appendiceal orifice, and rectum were photographed. Scope In: 8:47:10 AM Scope Out: 9:08:50 AM Scope Withdrawal Time: 0 hours 16 minutes 58 seconds  Total Procedure Duration: 0 hours 21 minutes 40 seconds  Findings:      The perianal and digital rectal examinations were normal.      A 4 mm polyp was found in the cecum. The polyp was sessile. The polyp       was removed with a cold snare. Resection and retrieval were complete.      Multiple small-mouthed diverticula were found in the  transverse colon       and left colon.      Internal hemorrhoids were found during retroflexion.      The exam was otherwise without abnormality. Impression:               - One 4 mm polyp in the cecum, removed with a cold                            snare. Resected and retrieved.                           - Diverticulosis in the transverse colon and in the                            left colon.                           - Internal hemorrhoids.                           - The examination was otherwise normal. Recommendation:           - Patient has a contact number available for                            emergencies. The signs and symptoms of potential                             delayed complications were discussed with the                            patient. Return to normal activities tomorrow.                            Written discharge instructions were provided to the                            patient.                           - Resume previous diet.                           - Continue present medications.                           - Await pathology results. Procedure Code(s):        --- Professional ---                           [phone], Colonoscopy, flexible; with removal of                            tumor(s), polyp(s), or other lesion(s) by snare                            technique Diagnosis Code(s):        ---  Professional ---                           Z12.11, Encounter for screening for malignant                            neoplasm of colon                           D12.0, Benign neoplasm of cecum                           K64.8, Other hemorrhoids                           K57.30, Diverticulosis of large intestine without                            perforation or abscess without bleeding CPT copyright 2022 American Medical Association. All rights reserved. The codes documented in this report are preliminary and upon coder review may  be revised to meet current compliance requirements. Remo Lipps P. Pink Maye, MD 02/10/2022 9:13:10 AM This report has been signed electronically. Number of Addenda: 0

## 2022-02-11 LAB — SURGICAL PATHOLOGY

## 2022-02-12 DIAGNOSIS — N2589 Other disorders resulting from impaired renal tubular function: Secondary | ICD-10-CM | POA: Diagnosis not present

## 2022-02-12 DIAGNOSIS — D631 Anemia in chronic kidney disease: Secondary | ICD-10-CM | POA: Diagnosis not present

## 2022-02-12 DIAGNOSIS — Z992 Dependence on renal dialysis: Secondary | ICD-10-CM | POA: Diagnosis not present

## 2022-02-12 DIAGNOSIS — N186 End stage renal disease: Secondary | ICD-10-CM | POA: Diagnosis not present

## 2022-02-12 DIAGNOSIS — E875 Hyperkalemia: Secondary | ICD-10-CM | POA: Diagnosis not present

## 2022-02-12 DIAGNOSIS — D509 Iron deficiency anemia, unspecified: Secondary | ICD-10-CM | POA: Diagnosis not present

## 2022-02-12 DIAGNOSIS — R82998 Other abnormal findings in urine: Secondary | ICD-10-CM | POA: Diagnosis not present

## 2022-02-12 DIAGNOSIS — N2581 Secondary hyperparathyroidism of renal origin: Secondary | ICD-10-CM | POA: Diagnosis not present

## 2022-02-13 ENCOUNTER — Encounter (HOSPITAL_COMMUNITY): Payer: Self-pay | Admitting: Gastroenterology

## 2022-02-13 DIAGNOSIS — N186 End stage renal disease: Secondary | ICD-10-CM | POA: Diagnosis not present

## 2022-02-13 DIAGNOSIS — E875 Hyperkalemia: Secondary | ICD-10-CM | POA: Diagnosis not present

## 2022-02-13 DIAGNOSIS — N2589 Other disorders resulting from impaired renal tubular function: Secondary | ICD-10-CM | POA: Diagnosis not present

## 2022-02-13 DIAGNOSIS — D631 Anemia in chronic kidney disease: Secondary | ICD-10-CM | POA: Diagnosis not present

## 2022-02-13 DIAGNOSIS — D509 Iron deficiency anemia, unspecified: Secondary | ICD-10-CM | POA: Diagnosis not present

## 2022-02-13 DIAGNOSIS — N2581 Secondary hyperparathyroidism of renal origin: Secondary | ICD-10-CM | POA: Diagnosis not present

## 2022-02-13 DIAGNOSIS — Z992 Dependence on renal dialysis: Secondary | ICD-10-CM | POA: Diagnosis not present

## 2022-02-13 DIAGNOSIS — R82998 Other abnormal findings in urine: Secondary | ICD-10-CM | POA: Diagnosis not present

## 2022-02-14 DIAGNOSIS — N2589 Other disorders resulting from impaired renal tubular function: Secondary | ICD-10-CM | POA: Diagnosis not present

## 2022-02-14 DIAGNOSIS — Z992 Dependence on renal dialysis: Secondary | ICD-10-CM | POA: Diagnosis not present

## 2022-02-14 DIAGNOSIS — E875 Hyperkalemia: Secondary | ICD-10-CM | POA: Diagnosis not present

## 2022-02-14 DIAGNOSIS — D509 Iron deficiency anemia, unspecified: Secondary | ICD-10-CM | POA: Diagnosis not present

## 2022-02-14 DIAGNOSIS — D631 Anemia in chronic kidney disease: Secondary | ICD-10-CM | POA: Diagnosis not present

## 2022-02-14 DIAGNOSIS — R82998 Other abnormal findings in urine: Secondary | ICD-10-CM | POA: Diagnosis not present

## 2022-02-14 DIAGNOSIS — N2581 Secondary hyperparathyroidism of renal origin: Secondary | ICD-10-CM | POA: Diagnosis not present

## 2022-02-14 DIAGNOSIS — N186 End stage renal disease: Secondary | ICD-10-CM | POA: Diagnosis not present

## 2022-02-15 ENCOUNTER — Ambulatory Visit
Admission: RE | Admit: 2022-02-15 | Discharge: 2022-02-15 | Disposition: A | Discharge status: Home / Self Care | Payer: Medicare Other | Source: Ambulatory Visit | Attending: Orthopaedic Surgery | Admitting: Orthopaedic Surgery

## 2022-02-15 DIAGNOSIS — E875 Hyperkalemia: Secondary | ICD-10-CM | POA: Diagnosis not present

## 2022-02-15 DIAGNOSIS — N186 End stage renal disease: Secondary | ICD-10-CM | POA: Diagnosis not present

## 2022-02-15 DIAGNOSIS — R82998 Other abnormal findings in urine: Secondary | ICD-10-CM | POA: Diagnosis not present

## 2022-02-15 DIAGNOSIS — D509 Iron deficiency anemia, unspecified: Secondary | ICD-10-CM | POA: Diagnosis not present

## 2022-02-15 DIAGNOSIS — N2589 Other disorders resulting from impaired renal tubular function: Secondary | ICD-10-CM | POA: Diagnosis not present

## 2022-02-15 DIAGNOSIS — Z992 Dependence on renal dialysis: Secondary | ICD-10-CM | POA: Diagnosis not present

## 2022-02-15 DIAGNOSIS — M549 Dorsalgia, unspecified: Secondary | ICD-10-CM

## 2022-02-15 DIAGNOSIS — N2581 Secondary hyperparathyroidism of renal origin: Secondary | ICD-10-CM | POA: Diagnosis not present

## 2022-02-15 DIAGNOSIS — D631 Anemia in chronic kidney disease: Secondary | ICD-10-CM | POA: Diagnosis not present

## 2022-02-16 DIAGNOSIS — Z992 Dependence on renal dialysis: Secondary | ICD-10-CM | POA: Diagnosis not present

## 2022-02-16 DIAGNOSIS — N186 End stage renal disease: Secondary | ICD-10-CM | POA: Diagnosis not present

## 2022-02-16 DIAGNOSIS — D509 Iron deficiency anemia, unspecified: Secondary | ICD-10-CM | POA: Diagnosis not present

## 2022-02-16 DIAGNOSIS — N2581 Secondary hyperparathyroidism of renal origin: Secondary | ICD-10-CM | POA: Diagnosis not present

## 2022-02-16 DIAGNOSIS — D631 Anemia in chronic kidney disease: Secondary | ICD-10-CM | POA: Diagnosis not present

## 2022-02-16 DIAGNOSIS — N2589 Other disorders resulting from impaired renal tubular function: Secondary | ICD-10-CM | POA: Diagnosis not present

## 2022-02-16 DIAGNOSIS — R82998 Other abnormal findings in urine: Secondary | ICD-10-CM | POA: Diagnosis not present

## 2022-02-16 DIAGNOSIS — E875 Hyperkalemia: Secondary | ICD-10-CM | POA: Diagnosis not present

## 2022-02-18 DIAGNOSIS — N2581 Secondary hyperparathyroidism of renal origin: Secondary | ICD-10-CM | POA: Diagnosis not present

## 2022-02-18 DIAGNOSIS — R82998 Other abnormal findings in urine: Secondary | ICD-10-CM | POA: Diagnosis not present

## 2022-02-18 DIAGNOSIS — N2589 Other disorders resulting from impaired renal tubular function: Secondary | ICD-10-CM | POA: Diagnosis not present

## 2022-02-18 DIAGNOSIS — D631 Anemia in chronic kidney disease: Secondary | ICD-10-CM | POA: Diagnosis not present

## 2022-02-18 DIAGNOSIS — Z992 Dependence on renal dialysis: Secondary | ICD-10-CM | POA: Diagnosis not present

## 2022-02-18 DIAGNOSIS — E875 Hyperkalemia: Secondary | ICD-10-CM | POA: Diagnosis not present

## 2022-02-18 DIAGNOSIS — N186 End stage renal disease: Secondary | ICD-10-CM | POA: Diagnosis not present

## 2022-02-18 DIAGNOSIS — D509 Iron deficiency anemia, unspecified: Secondary | ICD-10-CM | POA: Diagnosis not present

## 2022-02-19 DIAGNOSIS — R82998 Other abnormal findings in urine: Secondary | ICD-10-CM | POA: Diagnosis not present

## 2022-02-19 DIAGNOSIS — D509 Iron deficiency anemia, unspecified: Secondary | ICD-10-CM | POA: Diagnosis not present

## 2022-02-19 DIAGNOSIS — N2589 Other disorders resulting from impaired renal tubular function: Secondary | ICD-10-CM | POA: Diagnosis not present

## 2022-02-19 DIAGNOSIS — N2581 Secondary hyperparathyroidism of renal origin: Secondary | ICD-10-CM | POA: Diagnosis not present

## 2022-02-19 DIAGNOSIS — D631 Anemia in chronic kidney disease: Secondary | ICD-10-CM | POA: Diagnosis not present

## 2022-02-19 DIAGNOSIS — E875 Hyperkalemia: Secondary | ICD-10-CM | POA: Diagnosis not present

## 2022-02-19 DIAGNOSIS — N186 End stage renal disease: Secondary | ICD-10-CM | POA: Diagnosis not present

## 2022-02-19 DIAGNOSIS — Z992 Dependence on renal dialysis: Secondary | ICD-10-CM | POA: Diagnosis not present

## 2022-02-21 DIAGNOSIS — N2581 Secondary hyperparathyroidism of renal origin: Secondary | ICD-10-CM | POA: Diagnosis not present

## 2022-02-21 DIAGNOSIS — Z992 Dependence on renal dialysis: Secondary | ICD-10-CM | POA: Diagnosis not present

## 2022-02-21 DIAGNOSIS — D509 Iron deficiency anemia, unspecified: Secondary | ICD-10-CM | POA: Diagnosis not present

## 2022-02-21 DIAGNOSIS — E875 Hyperkalemia: Secondary | ICD-10-CM | POA: Diagnosis not present

## 2022-02-21 DIAGNOSIS — N2589 Other disorders resulting from impaired renal tubular function: Secondary | ICD-10-CM | POA: Diagnosis not present

## 2022-02-21 DIAGNOSIS — D631 Anemia in chronic kidney disease: Secondary | ICD-10-CM | POA: Diagnosis not present

## 2022-02-21 DIAGNOSIS — N186 End stage renal disease: Secondary | ICD-10-CM | POA: Diagnosis not present

## 2022-02-21 DIAGNOSIS — R82998 Other abnormal findings in urine: Secondary | ICD-10-CM | POA: Diagnosis not present

## 2022-02-22 DIAGNOSIS — N2581 Secondary hyperparathyroidism of renal origin: Secondary | ICD-10-CM | POA: Diagnosis not present

## 2022-02-22 DIAGNOSIS — N2589 Other disorders resulting from impaired renal tubular function: Secondary | ICD-10-CM | POA: Diagnosis not present

## 2022-02-22 DIAGNOSIS — D509 Iron deficiency anemia, unspecified: Secondary | ICD-10-CM | POA: Diagnosis not present

## 2022-02-22 DIAGNOSIS — D631 Anemia in chronic kidney disease: Secondary | ICD-10-CM | POA: Diagnosis not present

## 2022-02-22 DIAGNOSIS — Z992 Dependence on renal dialysis: Secondary | ICD-10-CM | POA: Diagnosis not present

## 2022-02-22 DIAGNOSIS — N186 End stage renal disease: Secondary | ICD-10-CM | POA: Diagnosis not present

## 2022-02-22 DIAGNOSIS — R82998 Other abnormal findings in urine: Secondary | ICD-10-CM | POA: Diagnosis not present

## 2022-02-22 DIAGNOSIS — E875 Hyperkalemia: Secondary | ICD-10-CM | POA: Diagnosis not present

## 2022-02-25 DIAGNOSIS — N186 End stage renal disease: Secondary | ICD-10-CM | POA: Diagnosis not present

## 2022-02-25 DIAGNOSIS — Z992 Dependence on renal dialysis: Secondary | ICD-10-CM | POA: Diagnosis not present

## 2022-02-25 DIAGNOSIS — D631 Anemia in chronic kidney disease: Secondary | ICD-10-CM | POA: Diagnosis not present

## 2022-02-25 DIAGNOSIS — N2581 Secondary hyperparathyroidism of renal origin: Secondary | ICD-10-CM | POA: Diagnosis not present

## 2022-02-25 DIAGNOSIS — E875 Hyperkalemia: Secondary | ICD-10-CM | POA: Diagnosis not present

## 2022-02-25 DIAGNOSIS — N2589 Other disorders resulting from impaired renal tubular function: Secondary | ICD-10-CM | POA: Diagnosis not present

## 2022-02-25 DIAGNOSIS — D509 Iron deficiency anemia, unspecified: Secondary | ICD-10-CM | POA: Diagnosis not present

## 2022-02-25 DIAGNOSIS — R82998 Other abnormal findings in urine: Secondary | ICD-10-CM | POA: Diagnosis not present

## 2022-02-27 DIAGNOSIS — Z992 Dependence on renal dialysis: Secondary | ICD-10-CM | POA: Diagnosis not present

## 2022-02-27 DIAGNOSIS — D631 Anemia in chronic kidney disease: Secondary | ICD-10-CM | POA: Diagnosis not present

## 2022-02-27 DIAGNOSIS — R82998 Other abnormal findings in urine: Secondary | ICD-10-CM | POA: Diagnosis not present

## 2022-02-27 DIAGNOSIS — N186 End stage renal disease: Secondary | ICD-10-CM | POA: Diagnosis not present

## 2022-02-27 DIAGNOSIS — N2589 Other disorders resulting from impaired renal tubular function: Secondary | ICD-10-CM | POA: Diagnosis not present

## 2022-02-27 DIAGNOSIS — N2581 Secondary hyperparathyroidism of renal origin: Secondary | ICD-10-CM | POA: Diagnosis not present

## 2022-02-27 DIAGNOSIS — E875 Hyperkalemia: Secondary | ICD-10-CM | POA: Diagnosis not present

## 2022-02-27 DIAGNOSIS — D509 Iron deficiency anemia, unspecified: Secondary | ICD-10-CM | POA: Diagnosis not present

## 2022-03-02 DIAGNOSIS — N2581 Secondary hyperparathyroidism of renal origin: Secondary | ICD-10-CM | POA: Diagnosis not present

## 2022-03-02 DIAGNOSIS — R82998 Other abnormal findings in urine: Secondary | ICD-10-CM | POA: Diagnosis not present

## 2022-03-02 DIAGNOSIS — Z992 Dependence on renal dialysis: Secondary | ICD-10-CM | POA: Diagnosis not present

## 2022-03-02 DIAGNOSIS — D631 Anemia in chronic kidney disease: Secondary | ICD-10-CM | POA: Diagnosis not present

## 2022-03-02 DIAGNOSIS — E875 Hyperkalemia: Secondary | ICD-10-CM | POA: Diagnosis not present

## 2022-03-02 DIAGNOSIS — N2589 Other disorders resulting from impaired renal tubular function: Secondary | ICD-10-CM | POA: Diagnosis not present

## 2022-03-02 DIAGNOSIS — N186 End stage renal disease: Secondary | ICD-10-CM | POA: Diagnosis not present

## 2022-03-02 DIAGNOSIS — D509 Iron deficiency anemia, unspecified: Secondary | ICD-10-CM | POA: Diagnosis not present

## 2022-03-03 DIAGNOSIS — Z79899 Other long term (current) drug therapy: Secondary | ICD-10-CM | POA: Diagnosis not present

## 2022-03-03 DIAGNOSIS — D631 Anemia in chronic kidney disease: Secondary | ICD-10-CM | POA: Diagnosis not present

## 2022-03-03 DIAGNOSIS — E440 Moderate protein-calorie malnutrition: Secondary | ICD-10-CM | POA: Diagnosis not present

## 2022-03-03 DIAGNOSIS — N2581 Secondary hyperparathyroidism of renal origin: Secondary | ICD-10-CM | POA: Diagnosis not present

## 2022-03-03 DIAGNOSIS — R17 Unspecified jaundice: Secondary | ICD-10-CM | POA: Diagnosis not present

## 2022-03-03 DIAGNOSIS — D509 Iron deficiency anemia, unspecified: Secondary | ICD-10-CM | POA: Diagnosis not present

## 2022-03-03 DIAGNOSIS — R82998 Other abnormal findings in urine: Secondary | ICD-10-CM | POA: Diagnosis not present

## 2022-03-03 DIAGNOSIS — Z992 Dependence on renal dialysis: Secondary | ICD-10-CM | POA: Diagnosis not present

## 2022-03-03 DIAGNOSIS — N186 End stage renal disease: Secondary | ICD-10-CM | POA: Diagnosis not present

## 2022-03-03 DIAGNOSIS — E875 Hyperkalemia: Secondary | ICD-10-CM | POA: Diagnosis not present

## 2022-03-05 DIAGNOSIS — N2581 Secondary hyperparathyroidism of renal origin: Secondary | ICD-10-CM | POA: Diagnosis not present

## 2022-03-05 DIAGNOSIS — E875 Hyperkalemia: Secondary | ICD-10-CM | POA: Diagnosis not present

## 2022-03-05 DIAGNOSIS — D631 Anemia in chronic kidney disease: Secondary | ICD-10-CM | POA: Diagnosis not present

## 2022-03-05 DIAGNOSIS — Z79899 Other long term (current) drug therapy: Secondary | ICD-10-CM | POA: Diagnosis not present

## 2022-03-05 DIAGNOSIS — R17 Unspecified jaundice: Secondary | ICD-10-CM | POA: Diagnosis not present

## 2022-03-05 DIAGNOSIS — E440 Moderate protein-calorie malnutrition: Secondary | ICD-10-CM | POA: Diagnosis not present

## 2022-03-05 DIAGNOSIS — D509 Iron deficiency anemia, unspecified: Secondary | ICD-10-CM | POA: Diagnosis not present

## 2022-03-05 DIAGNOSIS — Z992 Dependence on renal dialysis: Secondary | ICD-10-CM | POA: Diagnosis not present

## 2022-03-05 DIAGNOSIS — N186 End stage renal disease: Secondary | ICD-10-CM | POA: Diagnosis not present

## 2022-03-05 DIAGNOSIS — R82998 Other abnormal findings in urine: Secondary | ICD-10-CM | POA: Diagnosis not present

## 2022-03-06 DIAGNOSIS — R82998 Other abnormal findings in urine: Secondary | ICD-10-CM | POA: Diagnosis not present

## 2022-03-06 DIAGNOSIS — N2581 Secondary hyperparathyroidism of renal origin: Secondary | ICD-10-CM | POA: Diagnosis not present

## 2022-03-06 DIAGNOSIS — D631 Anemia in chronic kidney disease: Secondary | ICD-10-CM | POA: Diagnosis not present

## 2022-03-06 DIAGNOSIS — Z79899 Other long term (current) drug therapy: Secondary | ICD-10-CM | POA: Diagnosis not present

## 2022-03-06 DIAGNOSIS — Z992 Dependence on renal dialysis: Secondary | ICD-10-CM | POA: Diagnosis not present

## 2022-03-06 DIAGNOSIS — D509 Iron deficiency anemia, unspecified: Secondary | ICD-10-CM | POA: Diagnosis not present

## 2022-03-06 DIAGNOSIS — R17 Unspecified jaundice: Secondary | ICD-10-CM | POA: Diagnosis not present

## 2022-03-06 DIAGNOSIS — E440 Moderate protein-calorie malnutrition: Secondary | ICD-10-CM | POA: Diagnosis not present

## 2022-03-06 DIAGNOSIS — E875 Hyperkalemia: Secondary | ICD-10-CM | POA: Diagnosis not present

## 2022-03-06 DIAGNOSIS — N186 End stage renal disease: Secondary | ICD-10-CM | POA: Diagnosis not present

## 2022-03-09 DIAGNOSIS — N2581 Secondary hyperparathyroidism of renal origin: Secondary | ICD-10-CM | POA: Diagnosis not present

## 2022-03-09 DIAGNOSIS — Z79899 Other long term (current) drug therapy: Secondary | ICD-10-CM | POA: Diagnosis not present

## 2022-03-09 DIAGNOSIS — D509 Iron deficiency anemia, unspecified: Secondary | ICD-10-CM | POA: Diagnosis not present

## 2022-03-09 DIAGNOSIS — R82998 Other abnormal findings in urine: Secondary | ICD-10-CM | POA: Diagnosis not present

## 2022-03-09 DIAGNOSIS — N186 End stage renal disease: Secondary | ICD-10-CM | POA: Diagnosis not present

## 2022-03-09 DIAGNOSIS — E440 Moderate protein-calorie malnutrition: Secondary | ICD-10-CM | POA: Diagnosis not present

## 2022-03-09 DIAGNOSIS — R17 Unspecified jaundice: Secondary | ICD-10-CM | POA: Diagnosis not present

## 2022-03-09 DIAGNOSIS — Z992 Dependence on renal dialysis: Secondary | ICD-10-CM | POA: Diagnosis not present

## 2022-03-09 DIAGNOSIS — D631 Anemia in chronic kidney disease: Secondary | ICD-10-CM | POA: Diagnosis not present

## 2022-03-09 DIAGNOSIS — E875 Hyperkalemia: Secondary | ICD-10-CM | POA: Diagnosis not present

## 2022-03-10 ENCOUNTER — Ambulatory Visit (HOSPITAL_COMMUNITY)
Admission: RE | Admit: 2022-03-10 | Discharge: 2022-03-10 | Disposition: A | Discharge status: Home / Self Care | Payer: Medicare Other | Source: Ambulatory Visit | Attending: Surgery | Admitting: Surgery

## 2022-03-10 ENCOUNTER — Encounter: Payer: Self-pay | Admitting: Surgery

## 2022-03-10 ENCOUNTER — Ambulatory Visit: Payer: Medicare Other | Admitting: Surgery

## 2022-03-10 VITALS — BP 158/98 | HR 110 | Temp 98.1°F | Resp 20 | Ht 69.0 in | Wt 296.0 lb

## 2022-03-10 DIAGNOSIS — Z992 Dependence on renal dialysis: Secondary | ICD-10-CM

## 2022-03-10 DIAGNOSIS — N186 End stage renal disease: Secondary | ICD-10-CM | POA: Diagnosis not present

## 2022-03-10 DIAGNOSIS — I872 Venous insufficiency (chronic) (peripheral): Secondary | ICD-10-CM | POA: Diagnosis not present

## 2022-03-10 DIAGNOSIS — I825Y1 Chronic embolism and thrombosis of unspecified deep veins of right proximal lower extremity: Secondary | ICD-10-CM | POA: Diagnosis not present

## 2022-03-10 NOTE — Progress Notes (Signed)
[facility] [place]  Patient name: Hector Brooks MRN: [id number] DOB: 09/14/1970 Sex: male   REASON FOR VISIT:    Follow up  HISOTRY OF PRESENT ILLNESS:    Hector Brooks is a 52 y.o. male who is here today for evaluation of right leg DVT.  The patient states that approximately 9-10 years ago, he had right knee surgery, and in the postoperative period developed a right leg DVT.  This was treated with approximately 6 months of anticoagulation.  He also wore compression socks at that time.  He does not wear compression socks now.  He has leg swelling in both legs, the right is the worst.  He does not have any skin breakdown or ulcers.  The patient is on dialysis.  He dialyzes at home.  Left radiocephalic fistula.   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   Sleep apnea    does not use cpap   Vitamin D deficiency      FAMILY HISTORY:   Family History  Problem Relation Age of Onset   Prostate cancer Father    Diabetes Father    Drug abuse Father    Hyperlipidemia Sister    Stomach cancer Maternal Grandmother    Diabetes Maternal Grandfather    Heart disease Maternal Grandfather     SOCIAL HISTORY:   Social History   Tobacco Use   Smoking status: Never   Smokeless tobacco: Never  Substance Use Topics   Alcohol use: No    Alcohol/week: 0.0 standard drinks of alcohol      ALLERGIES:   No Known Allergies   CURRENT MEDICATIONS:   Current Outpatient Medications  Medication Sig Dispense Refill   acetaminophen (TYLENOL) 500 MG tablet Take 1,000 mg by mouth every 6 (six) hours as needed for headache.     calcitRIOL (ROCALTROL) 0.25 MCG capsule Take 0.25 mcg by mouth daily.     calcium acetate (PHOSLO) 667 MG capsule Take 1,334 mg by mouth 3 (three) times daily with meals.     diazepam (VALIUM) 5 MG tablet Take one tablet one hour prior to procedure. May repeat if needed. MUST HAVE DRIVER. 3 tablet 0   hydrALAZINE (APRESOLINE) 100 MG tablet Take 100 mg by mouth 2 (two) times daily.     NIFEdipine (PROCARDIA XL/NIFEDICAL XL) 60 MG 24 hr tablet Take 60 mg by mouth at bedtime.     olmesartan (BENICAR) 40 MG tablet Take 40 mg by mouth daily.     No current facility-administered medications for this visit.    REVIEW OF SYSTEMS:   '[X]'$  denotes positive finding, '[ ]'$  denotes negative finding Cardiac  Comments:  Chest pain or chest pressure:    Shortness of breath upon exertion:    Short of breath when lying flat:    Irregular heart rhythm:  Vascular    Pain in calf, thigh, or hip brought on by ambulation:    Pain in feet at night that wakes you up from your sleep:     Blood clot in your veins:    Leg swelling:  x       Pulmonary    Oxygen at home:    Productive cough:     Wheezing:         Neurologic    Sudden weakness in arms or legs:     Sudden numbness in arms or legs:     Sudden onset of difficulty speaking or slurred speech:    Temporary loss of vision in one eye:     Problems with dizziness:         Gastrointestinal    Blood in stool:     Vomited blood:         Genitourinary    Burning when urinating:     Blood in urine:        Psychiatric    Major depression:         Hematologic    Bleeding problems:    Problems with blood clotting too easily:        Skin    Rashes or ulcers:        Constitutional    Fever or  chills:      PHYSICAL EXAM:   Vitals:   03/10/22 1034  BP: (!) 158/98  Pulse: (!) 110  Resp: 20  Temp: 98.1 F (36.7 C)  SpO2: 92%  Weight: 296 lb (134.3 kg)  Height: '5\' 9"'$  (1.753 m)    GENERAL: The patient is a well-nourished male, in no acute distress. The vital signs are documented above. CARDIAC: There is a regular rate and rhythm.  VASCULAR: Pedal pulses are nonpalpable given the amount of edema however his feet are warm and well-perfused.  He has bilateral 2+ pitting edema with hyperpigmentation PULMONARY: Non-labored respirations ABDOMEN: Soft and non-tender  MUSCULOSKELETAL: There are no major deformities or cyanosis. NEUROLOGIC: No focal weakness or paresthesias are detected. SKIN: There are no ulcers or rashes noted. PSYCHIATRIC: The patient has a normal affect.  STUDIES:   I have reviewed the following:  RIGHT:  - Findings consistent with chronic deep vein thrombosis involving the  right femoral vein, and right popliteal vein. No significant change when  compared to prior study 05/09/2021.  - No cystic structure found in the popliteal fossa.    LEFT:  - There is no evidence of deep vein thrombosis in the lower extremity.    - No cystic structure found in the popliteal fossa.      MEDICAL ISSUES:   Right leg DVT: This appears to be a provoked DVT following knee surgery.  He completed a 67-monthcourse of anticoagulation and has not had any additional thrombotic episodes.  He does have a postphlebitic syndrome in that right leg on top of chronic bilateral lower extremity edema.  I stressed the importance of leg elevation and beginning to wear 20-30 thigh-high compression stockings again.  He will follow-up in 3 months with formal reflux testing.    WLeia Alf MD, FACS [phone] [phone]

## 2022-03-11 ENCOUNTER — Other Ambulatory Visit: Payer: Self-pay

## 2022-03-11 DIAGNOSIS — R82998 Other abnormal findings in urine: Secondary | ICD-10-CM | POA: Diagnosis not present

## 2022-03-11 DIAGNOSIS — R17 Unspecified jaundice: Secondary | ICD-10-CM | POA: Diagnosis not present

## 2022-03-11 DIAGNOSIS — N186 End stage renal disease: Secondary | ICD-10-CM | POA: Diagnosis not present

## 2022-03-11 DIAGNOSIS — Z992 Dependence on renal dialysis: Secondary | ICD-10-CM | POA: Diagnosis not present

## 2022-03-11 DIAGNOSIS — D631 Anemia in chronic kidney disease: Secondary | ICD-10-CM | POA: Diagnosis not present

## 2022-03-11 DIAGNOSIS — E875 Hyperkalemia: Secondary | ICD-10-CM | POA: Diagnosis not present

## 2022-03-11 DIAGNOSIS — R600 Localized edema: Secondary | ICD-10-CM

## 2022-03-11 DIAGNOSIS — N2581 Secondary hyperparathyroidism of renal origin: Secondary | ICD-10-CM | POA: Diagnosis not present

## 2022-03-11 DIAGNOSIS — Z79899 Other long term (current) drug therapy: Secondary | ICD-10-CM | POA: Diagnosis not present

## 2022-03-11 DIAGNOSIS — I872 Venous insufficiency (chronic) (peripheral): Secondary | ICD-10-CM

## 2022-03-11 DIAGNOSIS — D509 Iron deficiency anemia, unspecified: Secondary | ICD-10-CM | POA: Diagnosis not present

## 2022-03-11 DIAGNOSIS — E440 Moderate protein-calorie malnutrition: Secondary | ICD-10-CM | POA: Diagnosis not present

## 2022-03-12 DIAGNOSIS — Z992 Dependence on renal dialysis: Secondary | ICD-10-CM | POA: Diagnosis not present

## 2022-03-12 DIAGNOSIS — E875 Hyperkalemia: Secondary | ICD-10-CM | POA: Diagnosis not present

## 2022-03-12 DIAGNOSIS — Z79899 Other long term (current) drug therapy: Secondary | ICD-10-CM | POA: Diagnosis not present

## 2022-03-12 DIAGNOSIS — R82998 Other abnormal findings in urine: Secondary | ICD-10-CM | POA: Diagnosis not present

## 2022-03-12 DIAGNOSIS — D631 Anemia in chronic kidney disease: Secondary | ICD-10-CM | POA: Diagnosis not present

## 2022-03-12 DIAGNOSIS — R17 Unspecified jaundice: Secondary | ICD-10-CM | POA: Diagnosis not present

## 2022-03-12 DIAGNOSIS — E440 Moderate protein-calorie malnutrition: Secondary | ICD-10-CM | POA: Diagnosis not present

## 2022-03-12 DIAGNOSIS — N2581 Secondary hyperparathyroidism of renal origin: Secondary | ICD-10-CM | POA: Diagnosis not present

## 2022-03-12 DIAGNOSIS — D509 Iron deficiency anemia, unspecified: Secondary | ICD-10-CM | POA: Diagnosis not present

## 2022-03-12 DIAGNOSIS — N186 End stage renal disease: Secondary | ICD-10-CM | POA: Diagnosis not present

## 2022-03-13 DIAGNOSIS — Z79899 Other long term (current) drug therapy: Secondary | ICD-10-CM | POA: Diagnosis not present

## 2022-03-13 DIAGNOSIS — N186 End stage renal disease: Secondary | ICD-10-CM | POA: Diagnosis not present

## 2022-03-13 DIAGNOSIS — Z992 Dependence on renal dialysis: Secondary | ICD-10-CM | POA: Diagnosis not present

## 2022-03-13 DIAGNOSIS — N2581 Secondary hyperparathyroidism of renal origin: Secondary | ICD-10-CM | POA: Diagnosis not present

## 2022-03-13 DIAGNOSIS — E440 Moderate protein-calorie malnutrition: Secondary | ICD-10-CM | POA: Diagnosis not present

## 2022-03-13 DIAGNOSIS — D509 Iron deficiency anemia, unspecified: Secondary | ICD-10-CM | POA: Diagnosis not present

## 2022-03-13 DIAGNOSIS — R82998 Other abnormal findings in urine: Secondary | ICD-10-CM | POA: Diagnosis not present

## 2022-03-13 DIAGNOSIS — E875 Hyperkalemia: Secondary | ICD-10-CM | POA: Diagnosis not present

## 2022-03-13 DIAGNOSIS — R17 Unspecified jaundice: Secondary | ICD-10-CM | POA: Diagnosis not present

## 2022-03-13 DIAGNOSIS — D631 Anemia in chronic kidney disease: Secondary | ICD-10-CM | POA: Diagnosis not present

## 2022-03-17 DIAGNOSIS — R82998 Other abnormal findings in urine: Secondary | ICD-10-CM | POA: Diagnosis not present

## 2022-03-17 DIAGNOSIS — Z992 Dependence on renal dialysis: Secondary | ICD-10-CM | POA: Diagnosis not present

## 2022-03-17 DIAGNOSIS — E440 Moderate protein-calorie malnutrition: Secondary | ICD-10-CM | POA: Diagnosis not present

## 2022-03-17 DIAGNOSIS — E875 Hyperkalemia: Secondary | ICD-10-CM | POA: Diagnosis not present

## 2022-03-17 DIAGNOSIS — Z79899 Other long term (current) drug therapy: Secondary | ICD-10-CM | POA: Diagnosis not present

## 2022-03-17 DIAGNOSIS — D631 Anemia in chronic kidney disease: Secondary | ICD-10-CM | POA: Diagnosis not present

## 2022-03-17 DIAGNOSIS — R17 Unspecified jaundice: Secondary | ICD-10-CM | POA: Diagnosis not present

## 2022-03-17 DIAGNOSIS — D509 Iron deficiency anemia, unspecified: Secondary | ICD-10-CM | POA: Diagnosis not present

## 2022-03-17 DIAGNOSIS — N2581 Secondary hyperparathyroidism of renal origin: Secondary | ICD-10-CM | POA: Diagnosis not present

## 2022-03-17 DIAGNOSIS — N186 End stage renal disease: Secondary | ICD-10-CM | POA: Diagnosis not present

## 2022-03-18 ENCOUNTER — Ambulatory Visit: Payer: Medicare Other | Admitting: Orthopaedic Surgery

## 2022-03-18 ENCOUNTER — Telehealth: Payer: Self-pay

## 2022-03-18 VITALS — BP 152/91 | HR 108

## 2022-03-18 DIAGNOSIS — M546 Pain in thoracic spine: Secondary | ICD-10-CM | POA: Diagnosis not present

## 2022-03-18 NOTE — Telephone Encounter (Signed)
Dr Lorin Mercy wanted me to let you know patient was seen today and is ready to be scheduled for MRI with sedation

## 2022-03-18 NOTE — Progress Notes (Signed)
Office Visit Note/History and Physical exam   Patient: Hector Brooks           Date of Birth: May 06, 1970           MRN: [id number] Visit Date: 03/18/2022              Requested by: No referring provider defined for this encounter. PCP: Patient, No Pcp Per   Assessment & Plan: Visit Diagnoses:  1. Pain in thoracic spine     Plan: Proceed with conscious sedation thoracic MRI scan office follow-up after scan.  Follow-Up Instructions: No follow-ups on file.   Orders:  No orders of the defined types were placed in this encounter.  No orders of the defined types were placed in this encounter.     Procedures: No procedures performed   Clinical Data: No additional findings.   Subjective: Chief Complaint  Patient presents with   Other    H&P to proceed with MRI requiring sedation    HPI 52 year old male on chronic dialysis renal failure originally referred to me by Dr. Posey Pronto for radiographic changes at T8-T9 and T11-T12 for concern for possible discitis or osteomyelitis.  He has been on dialysis for about 2 years past history of DVT with follow-up negative Doppler and negative chest images done in March.  Patient does have prediabetes BMI greater than 43.  MRI was ordered Valium sent in but he was too claustrophobic and unable to proceed.  He had been able to tolerate a CT scan opening when he had had Valium in the past.  He returns today since have not seen him since November for history and physical so he can proceed with conscious sedation and thoracic MRI.benign neoplasm of the colon, history of DVT or brain chronic.  Chronic dialysis.  History of hypertension obesity obstructive sleep apnea.  All systems noncontributory HPI. benign neoplasm Review of Systems all systems noncontributory HPI note fever or chills.   Objective: Vital Signs: BP (!) 152/91   Pulse (!) 108   Physical Exam Constitutional:      Appearance: He is well-developed.  HENT:     Head:  Normocephalic and atraumatic.     Right Ear: External ear normal.     Left Ear: External ear normal.  Eyes:     Pupils: Pupils are equal, round, and reactive to light.  Neck:     Thyroid: No thyromegaly.     Trachea: No tracheal deviation.  Cardiovascular:     Rate and Rhythm: Normal rate.  Pulmonary:     Effort: Pulmonary effort is normal.     Breath sounds: No wheezing.  Abdominal:     General: Bowel sounds are normal.     Palpations: Abdomen is soft.  Musculoskeletal:     Cervical back: Neck supple.  Skin:    General: Skin is warm and dry.     Capillary Refill: Capillary refill takes less than 2 seconds.  Neurological:     Mental Status: He is alert and oriented to person, place, and time.  Psychiatric:        Behavior: Behavior normal.        Thought Content: Thought content normal.        Judgment: Judgment normal.     Ortho Exam no thoracic curvature.  Normal heel-toe gait.  Some discomfort palpation of the thoracic region.  Lower extremity strength is normal.  Shunts left upper extremity for dialysis noted.  Specialty Comments:  No specialty comments available.  Imaging: No results found.   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   Generalized edema 07/04/2017   History of DVT of lower extremity 07/04/2017   Morbid obesity ([place]) 07/04/2017   Pure  hypercholesterolemia 07/04/2017   Prediabetes 07/04/2017   Hypertension 05/21/2016   DVT, femoral, chronic ([employer]) 09/29/2014   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   ESRD (end stage renal disease) on dialysis [facility])    History of torn meniscus of right knee    HLD (hyperlipidemia)    HTN (hypertension)    Hx of blood clots    Hyperkalemia 05/09/2021   Lower extremity edema    Metabolic acidosis 69/48/5462   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   Sleep apnea    does not use cpap   Vitamin D deficiency     Family History  Problem Relation Age of Onset   Prostate cancer Father    Diabetes Father    Drug abuse Father    Hyperlipidemia Sister    Stomach cancer Maternal Grandmother    Diabetes Maternal Grandfather    Heart disease Maternal Grandfather     Past Surgical History:  Procedure Laterality Date   AV FISTULA PLACEMENT Left 02/09/2020   Procedure: LEFT ARM FISTULA CREATION;  Surgeon: Serafina Mitchell, MD;  Location: [facility] OR;  Service: Vascular;  Laterality: Left;   COLONOSCOPY WITH PROPOFOL N/A 02/10/2022   Procedure: COLONOSCOPY WITH PROPOFOL;  Surgeon: Yetta Flock, MD;  Location: [place] [facility];  Service: Gastroenterology;  Laterality: N/A;   INSERTION OF DIALYSIS CATHETER Right 05/22/2020   Procedure: INSERTION OF TUNNELED DIALYSIS CATHETER;  Surgeon: Angelia Mould, MD;  Location: [place];  Service: Vascular;  Laterality: Right;   MENISCUS REPAIR Right 12/14   POLYPECTOMY  02/10/2022   Procedure: POLYPECTOMY;  Surgeon: Yetta Flock, MD;  Location: [facility];  Service: Gastroenterology;;   Social History   Occupational History   Not on file  Tobacco Use   Smoking status: Never   Smokeless tobacco: Never  Vaping Use    Vaping Use: Never used  Substance and Sexual Activity   Alcohol use: No    Alcohol/week: 0.0 standard drinks of alcohol   Drug use: No   Sexual activity: Yes

## 2022-03-19 DIAGNOSIS — Z79899 Other long term (current) drug therapy: Secondary | ICD-10-CM | POA: Diagnosis not present

## 2022-03-19 DIAGNOSIS — E875 Hyperkalemia: Secondary | ICD-10-CM | POA: Diagnosis not present

## 2022-03-19 DIAGNOSIS — Z992 Dependence on renal dialysis: Secondary | ICD-10-CM | POA: Diagnosis not present

## 2022-03-19 DIAGNOSIS — R82998 Other abnormal findings in urine: Secondary | ICD-10-CM | POA: Diagnosis not present

## 2022-03-19 DIAGNOSIS — R17 Unspecified jaundice: Secondary | ICD-10-CM | POA: Diagnosis not present

## 2022-03-19 DIAGNOSIS — D631 Anemia in chronic kidney disease: Secondary | ICD-10-CM | POA: Diagnosis not present

## 2022-03-19 DIAGNOSIS — E440 Moderate protein-calorie malnutrition: Secondary | ICD-10-CM | POA: Diagnosis not present

## 2022-03-19 DIAGNOSIS — N186 End stage renal disease: Secondary | ICD-10-CM | POA: Diagnosis not present

## 2022-03-19 DIAGNOSIS — D509 Iron deficiency anemia, unspecified: Secondary | ICD-10-CM | POA: Diagnosis not present

## 2022-03-19 DIAGNOSIS — N2581 Secondary hyperparathyroidism of renal origin: Secondary | ICD-10-CM | POA: Diagnosis not present

## 2022-03-19 NOTE — Telephone Encounter (Signed)
Sedation form has been faxed to [facility] with [facility] scheduling. They will contact pt to schedule appt

## 2022-03-20 DIAGNOSIS — R17 Unspecified jaundice: Secondary | ICD-10-CM | POA: Diagnosis not present

## 2022-03-20 DIAGNOSIS — E440 Moderate protein-calorie malnutrition: Secondary | ICD-10-CM | POA: Diagnosis not present

## 2022-03-20 DIAGNOSIS — N2581 Secondary hyperparathyroidism of renal origin: Secondary | ICD-10-CM | POA: Diagnosis not present

## 2022-03-20 DIAGNOSIS — Z992 Dependence on renal dialysis: Secondary | ICD-10-CM | POA: Diagnosis not present

## 2022-03-20 DIAGNOSIS — Z79899 Other long term (current) drug therapy: Secondary | ICD-10-CM | POA: Diagnosis not present

## 2022-03-20 DIAGNOSIS — N186 End stage renal disease: Secondary | ICD-10-CM | POA: Diagnosis not present

## 2022-03-20 DIAGNOSIS — D631 Anemia in chronic kidney disease: Secondary | ICD-10-CM | POA: Diagnosis not present

## 2022-03-20 DIAGNOSIS — R82998 Other abnormal findings in urine: Secondary | ICD-10-CM | POA: Diagnosis not present

## 2022-03-20 DIAGNOSIS — E875 Hyperkalemia: Secondary | ICD-10-CM | POA: Diagnosis not present

## 2022-03-20 DIAGNOSIS — D509 Iron deficiency anemia, unspecified: Secondary | ICD-10-CM | POA: Diagnosis not present

## 2022-03-23 DIAGNOSIS — D509 Iron deficiency anemia, unspecified: Secondary | ICD-10-CM | POA: Diagnosis not present

## 2022-03-23 DIAGNOSIS — R17 Unspecified jaundice: Secondary | ICD-10-CM | POA: Diagnosis not present

## 2022-03-23 DIAGNOSIS — N2581 Secondary hyperparathyroidism of renal origin: Secondary | ICD-10-CM | POA: Diagnosis not present

## 2022-03-23 DIAGNOSIS — N186 End stage renal disease: Secondary | ICD-10-CM | POA: Diagnosis not present

## 2022-03-23 DIAGNOSIS — R82998 Other abnormal findings in urine: Secondary | ICD-10-CM | POA: Diagnosis not present

## 2022-03-23 DIAGNOSIS — Z992 Dependence on renal dialysis: Secondary | ICD-10-CM | POA: Diagnosis not present

## 2022-03-23 DIAGNOSIS — Z79899 Other long term (current) drug therapy: Secondary | ICD-10-CM | POA: Diagnosis not present

## 2022-03-23 DIAGNOSIS — E875 Hyperkalemia: Secondary | ICD-10-CM | POA: Diagnosis not present

## 2022-03-23 DIAGNOSIS — D631 Anemia in chronic kidney disease: Secondary | ICD-10-CM | POA: Diagnosis not present

## 2022-03-23 DIAGNOSIS — E440 Moderate protein-calorie malnutrition: Secondary | ICD-10-CM | POA: Diagnosis not present

## 2022-03-25 DIAGNOSIS — R17 Unspecified jaundice: Secondary | ICD-10-CM | POA: Diagnosis not present

## 2022-03-25 DIAGNOSIS — E875 Hyperkalemia: Secondary | ICD-10-CM | POA: Diagnosis not present

## 2022-03-25 DIAGNOSIS — Z79899 Other long term (current) drug therapy: Secondary | ICD-10-CM | POA: Diagnosis not present

## 2022-03-25 DIAGNOSIS — R82998 Other abnormal findings in urine: Secondary | ICD-10-CM | POA: Diagnosis not present

## 2022-03-25 DIAGNOSIS — N186 End stage renal disease: Secondary | ICD-10-CM | POA: Diagnosis not present

## 2022-03-25 DIAGNOSIS — N2581 Secondary hyperparathyroidism of renal origin: Secondary | ICD-10-CM | POA: Diagnosis not present

## 2022-03-25 DIAGNOSIS — D631 Anemia in chronic kidney disease: Secondary | ICD-10-CM | POA: Diagnosis not present

## 2022-03-25 DIAGNOSIS — Z992 Dependence on renal dialysis: Secondary | ICD-10-CM | POA: Diagnosis not present

## 2022-03-25 DIAGNOSIS — D509 Iron deficiency anemia, unspecified: Secondary | ICD-10-CM | POA: Diagnosis not present

## 2022-03-25 DIAGNOSIS — E440 Moderate protein-calorie malnutrition: Secondary | ICD-10-CM | POA: Diagnosis not present

## 2022-03-28 DIAGNOSIS — Z79899 Other long term (current) drug therapy: Secondary | ICD-10-CM | POA: Diagnosis not present

## 2022-03-28 DIAGNOSIS — D631 Anemia in chronic kidney disease: Secondary | ICD-10-CM | POA: Diagnosis not present

## 2022-03-28 DIAGNOSIS — R17 Unspecified jaundice: Secondary | ICD-10-CM | POA: Diagnosis not present

## 2022-03-28 DIAGNOSIS — N186 End stage renal disease: Secondary | ICD-10-CM | POA: Diagnosis not present

## 2022-03-28 DIAGNOSIS — R82998 Other abnormal findings in urine: Secondary | ICD-10-CM | POA: Diagnosis not present

## 2022-03-28 DIAGNOSIS — D509 Iron deficiency anemia, unspecified: Secondary | ICD-10-CM | POA: Diagnosis not present

## 2022-03-28 DIAGNOSIS — Z992 Dependence on renal dialysis: Secondary | ICD-10-CM | POA: Diagnosis not present

## 2022-03-28 DIAGNOSIS — E440 Moderate protein-calorie malnutrition: Secondary | ICD-10-CM | POA: Diagnosis not present

## 2022-03-28 DIAGNOSIS — N2581 Secondary hyperparathyroidism of renal origin: Secondary | ICD-10-CM | POA: Diagnosis not present

## 2022-03-28 DIAGNOSIS — E875 Hyperkalemia: Secondary | ICD-10-CM | POA: Diagnosis not present

## 2022-03-29 DIAGNOSIS — N186 End stage renal disease: Secondary | ICD-10-CM | POA: Diagnosis not present

## 2022-03-29 DIAGNOSIS — E440 Moderate protein-calorie malnutrition: Secondary | ICD-10-CM | POA: Diagnosis not present

## 2022-03-29 DIAGNOSIS — Z992 Dependence on renal dialysis: Secondary | ICD-10-CM | POA: Diagnosis not present

## 2022-03-29 DIAGNOSIS — E875 Hyperkalemia: Secondary | ICD-10-CM | POA: Diagnosis not present

## 2022-03-29 DIAGNOSIS — R17 Unspecified jaundice: Secondary | ICD-10-CM | POA: Diagnosis not present

## 2022-03-29 DIAGNOSIS — D509 Iron deficiency anemia, unspecified: Secondary | ICD-10-CM | POA: Diagnosis not present

## 2022-03-29 DIAGNOSIS — R82998 Other abnormal findings in urine: Secondary | ICD-10-CM | POA: Diagnosis not present

## 2022-03-29 DIAGNOSIS — N2581 Secondary hyperparathyroidism of renal origin: Secondary | ICD-10-CM | POA: Diagnosis not present

## 2022-03-29 DIAGNOSIS — Z79899 Other long term (current) drug therapy: Secondary | ICD-10-CM | POA: Diagnosis not present

## 2022-03-29 DIAGNOSIS — D631 Anemia in chronic kidney disease: Secondary | ICD-10-CM | POA: Diagnosis not present

## 2022-03-30 DIAGNOSIS — R17 Unspecified jaundice: Secondary | ICD-10-CM | POA: Diagnosis not present

## 2022-03-30 DIAGNOSIS — R82998 Other abnormal findings in urine: Secondary | ICD-10-CM | POA: Diagnosis not present

## 2022-03-30 DIAGNOSIS — E875 Hyperkalemia: Secondary | ICD-10-CM | POA: Diagnosis not present

## 2022-03-30 DIAGNOSIS — D631 Anemia in chronic kidney disease: Secondary | ICD-10-CM | POA: Diagnosis not present

## 2022-03-30 DIAGNOSIS — E440 Moderate protein-calorie malnutrition: Secondary | ICD-10-CM | POA: Diagnosis not present

## 2022-03-30 DIAGNOSIS — Z992 Dependence on renal dialysis: Secondary | ICD-10-CM | POA: Diagnosis not present

## 2022-03-30 DIAGNOSIS — Z79899 Other long term (current) drug therapy: Secondary | ICD-10-CM | POA: Diagnosis not present

## 2022-03-30 DIAGNOSIS — D509 Iron deficiency anemia, unspecified: Secondary | ICD-10-CM | POA: Diagnosis not present

## 2022-03-30 DIAGNOSIS — N2581 Secondary hyperparathyroidism of renal origin: Secondary | ICD-10-CM | POA: Diagnosis not present

## 2022-03-30 DIAGNOSIS — N186 End stage renal disease: Secondary | ICD-10-CM | POA: Diagnosis not present

## 2022-04-02 ENCOUNTER — Encounter (HOSPITAL_COMMUNITY): Payer: Self-pay | Admitting: *Deleted

## 2022-04-02 DIAGNOSIS — R82998 Other abnormal findings in urine: Secondary | ICD-10-CM | POA: Diagnosis not present

## 2022-04-02 DIAGNOSIS — R17 Unspecified jaundice: Secondary | ICD-10-CM | POA: Diagnosis not present

## 2022-04-02 DIAGNOSIS — Z79899 Other long term (current) drug therapy: Secondary | ICD-10-CM | POA: Diagnosis not present

## 2022-04-02 DIAGNOSIS — D631 Anemia in chronic kidney disease: Secondary | ICD-10-CM | POA: Diagnosis not present

## 2022-04-02 DIAGNOSIS — E875 Hyperkalemia: Secondary | ICD-10-CM | POA: Diagnosis not present

## 2022-04-02 DIAGNOSIS — E440 Moderate protein-calorie malnutrition: Secondary | ICD-10-CM | POA: Diagnosis not present

## 2022-04-02 DIAGNOSIS — N186 End stage renal disease: Secondary | ICD-10-CM | POA: Diagnosis not present

## 2022-04-02 DIAGNOSIS — D509 Iron deficiency anemia, unspecified: Secondary | ICD-10-CM | POA: Diagnosis not present

## 2022-04-02 DIAGNOSIS — Z992 Dependence on renal dialysis: Secondary | ICD-10-CM | POA: Diagnosis not present

## 2022-04-02 DIAGNOSIS — N2581 Secondary hyperparathyroidism of renal origin: Secondary | ICD-10-CM | POA: Diagnosis not present

## 2022-04-02 HISTORY — DX: End stage renal disease: N18.6

## 2022-04-02 NOTE — Progress Notes (Signed)
PCP - None Cardiologist - None  Chest x-ray - 05/09/21 EKG - 05/08/21 Stress Test - 12/26/20 CE ECHO - 02/08/19 Cardiac Cath - n/a  ICD Pacemaker/Loop - n/a  Sleep Study -  Yes CPAP - does not use CPAP  Anesthesia review: Yes  Last Dialysis was on 04/01/22.  Dialysis at home 4 times a week.   STOP now taking any Aspirin (unless otherwise instructed by your surgeon), Aleve, Naproxen, Ibuprofen, Motrin, Advil, Goody's, BC's, all herbal medications, fish oil, and all vitamins.   Coronavirus Screening Do you have any of the following symptoms:  Cough yes/no: No Fever (>100.46F)  yes/no: No Runny nose yes/no: No Sore throat yes/no: No Difficulty breathing/shortness of breath  yes/no: No  Have you traveled in the last 14 days and where? yes/no: No  Patient verbalized understanding of instructions that were given via phone.

## 2022-04-03 ENCOUNTER — Ambulatory Visit (HOSPITAL_COMMUNITY)
Admission: RE | Admit: 2022-04-03 | Discharge: 2022-04-03 | Disposition: A | Discharge status: Home / Self Care | Payer: Medicare Other | Source: Ambulatory Visit | Attending: Orthopaedic Surgery | Admitting: Orthopaedic Surgery

## 2022-04-03 ENCOUNTER — Ambulatory Visit (HOSPITAL_COMMUNITY): Payer: Medicare Other | Admitting: Physician Assistant

## 2022-04-03 ENCOUNTER — Encounter (HOSPITAL_COMMUNITY): Payer: Self-pay

## 2022-04-03 ENCOUNTER — Other Ambulatory Visit: Payer: Self-pay

## 2022-04-03 ENCOUNTER — Ambulatory Visit (HOSPITAL_BASED_OUTPATIENT_CLINIC_OR_DEPARTMENT_OTHER): Payer: Medicare Other | Admitting: Physician Assistant

## 2022-04-03 ENCOUNTER — Encounter (HOSPITAL_COMMUNITY): Admission: RE | Disposition: A | Discharge status: Home / Self Care | Payer: Self-pay | Source: Ambulatory Visit

## 2022-04-03 DIAGNOSIS — M546 Pain in thoracic spine: Secondary | ICD-10-CM | POA: Diagnosis not present

## 2022-04-03 DIAGNOSIS — M438X4 Other specified deforming dorsopathies, thoracic region: Secondary | ICD-10-CM | POA: Diagnosis not present

## 2022-04-03 DIAGNOSIS — G4730 Sleep apnea, unspecified: Secondary | ICD-10-CM | POA: Insufficient documentation

## 2022-04-03 DIAGNOSIS — Z6841 Body Mass Index (BMI) 40.0 and over, adult: Secondary | ICD-10-CM | POA: Insufficient documentation

## 2022-04-03 DIAGNOSIS — D649 Anemia, unspecified: Secondary | ICD-10-CM

## 2022-04-03 DIAGNOSIS — Z79899 Other long term (current) drug therapy: Secondary | ICD-10-CM | POA: Diagnosis not present

## 2022-04-03 DIAGNOSIS — I739 Peripheral vascular disease, unspecified: Secondary | ICD-10-CM

## 2022-04-03 DIAGNOSIS — M4804 Spinal stenosis, thoracic region: Secondary | ICD-10-CM | POA: Insufficient documentation

## 2022-04-03 DIAGNOSIS — M549 Dorsalgia, unspecified: Secondary | ICD-10-CM | POA: Insufficient documentation

## 2022-04-03 DIAGNOSIS — I10 Essential (primary) hypertension: Secondary | ICD-10-CM | POA: Diagnosis not present

## 2022-04-03 DIAGNOSIS — M47814 Spondylosis without myelopathy or radiculopathy, thoracic region: Secondary | ICD-10-CM | POA: Insufficient documentation

## 2022-04-03 HISTORY — PX: RADIOLOGY WITH ANESTHESIA: SHX6223

## 2022-04-03 LAB — POCT I-STAT, CHEM 8
BUN: 88 mg/dL — ABNORMAL HIGH (ref 6–20)
Calcium, Ion: 1.01 mmol/L — ABNORMAL LOW (ref 1.15–1.40)
Chloride: 105 mmol/L (ref 98–111)
Creatinine, Ser: 13.7 mg/dL — ABNORMAL HIGH (ref 0.61–1.24)
Glucose, Bld: 88 mg/dL (ref 70–99)
HCT: 26 % — ABNORMAL LOW (ref 39.0–52.0)
Hemoglobin: 8.8 g/dL — ABNORMAL LOW (ref 13.0–17.0)
Potassium: 5.6 mmol/L — ABNORMAL HIGH (ref 3.5–5.1)
Sodium: 138 mmol/L (ref 135–145)
TCO2: 25 mmol/L (ref 22–32)

## 2022-04-03 SURGERY — MRI WITH ANESTHESIA
Anesthesia: General

## 2022-04-03 MED ORDER — SODIUM CHLORIDE 0.9 % IV SOLN: INTRAVENOUS | Status: DC

## 2022-04-03 MED ORDER — CHLORHEXIDINE GLUCONATE 0.12 % MT SOLN
OROMUCOSAL | Status: AC
  Filled 2022-04-03: qty 15

## 2022-04-03 MED ORDER — LIDOCAINE 2% (20 MG/ML) 5 ML SYRINGE
INTRAMUSCULAR | Status: DC | PRN
  Administered 2022-04-03: 40 mg via INTRAVENOUS

## 2022-04-03 MED ORDER — OXYCODONE HCL 5 MG/5ML PO SOLN: 5.0000 mg | Freq: Once | ORAL | Status: DC | PRN

## 2022-04-03 MED ORDER — DEXAMETHASONE SODIUM PHOSPHATE 10 MG/ML IJ SOLN
INTRAMUSCULAR | Status: DC | PRN
  Administered 2022-04-03: 10 mg via INTRAVENOUS

## 2022-04-03 MED ORDER — OXYCODONE HCL 5 MG PO TABS: 5.0000 mg | ORAL_TABLET | Freq: Once | ORAL | Status: DC | PRN

## 2022-04-03 MED ORDER — PHENYLEPHRINE HCL-NACL 20-0.9 MG/250ML-% IV SOLN
INTRAVENOUS | Status: DC | PRN
  Administered 2022-04-03: 40 ug/min via INTRAVENOUS

## 2022-04-03 MED ORDER — ORAL CARE MOUTH RINSE: 15.0000 mL | Freq: Once | OROMUCOSAL | Status: AC

## 2022-04-03 MED ORDER — CHLORHEXIDINE GLUCONATE 0.12 % MT SOLN
15.0000 mL | Freq: Once | OROMUCOSAL | Status: AC
  Administered 2022-04-03: 15 mL via OROMUCOSAL

## 2022-04-03 MED ORDER — HYDROMORPHONE HCL 1 MG/ML IJ SOLN: 0.2500 mg | INTRAMUSCULAR | Status: DC | PRN

## 2022-04-03 MED ORDER — MIDAZOLAM HCL 2 MG/2ML IJ SOLN
INTRAMUSCULAR | Status: DC | PRN
  Administered 2022-04-03: 2 mg via INTRAVENOUS

## 2022-04-03 MED ORDER — AMISULPRIDE (ANTIEMETIC) 5 MG/2ML IV SOLN: 10.0000 mg | Freq: Once | INTRAVENOUS | Status: DC | PRN

## 2022-04-03 MED ORDER — PROMETHAZINE HCL 25 MG/ML IJ SOLN: 6.2500 mg | INTRAMUSCULAR | Status: DC | PRN

## 2022-04-03 MED ORDER — PHENYLEPHRINE 80 MCG/ML (10ML) SYRINGE FOR IV PUSH (FOR BLOOD PRESSURE SUPPORT)
PREFILLED_SYRINGE | INTRAVENOUS | Status: DC | PRN
  Administered 2022-04-03 (×2): 200 ug via INTRAVENOUS

## 2022-04-03 MED ORDER — ONDANSETRON HCL 4 MG/2ML IJ SOLN
INTRAMUSCULAR | Status: DC | PRN
  Administered 2022-04-03: 4 mg via INTRAVENOUS

## 2022-04-03 MED ORDER — EPHEDRINE SULFATE-NACL 50-0.9 MG/10ML-% IV SOSY
PREFILLED_SYRINGE | INTRAVENOUS | Status: DC | PRN
  Administered 2022-04-03: 10 mg via INTRAVENOUS

## 2022-04-03 MED ORDER — PROPOFOL 10 MG/ML IV BOLUS
INTRAVENOUS | Status: DC | PRN
  Administered 2022-04-03: 200 mg via INTRAVENOUS

## 2022-04-03 NOTE — Anesthesia Preprocedure Evaluation (Addendum)
Anesthesia Evaluation  Patient identified by MRN, date of birth, ID band Patient awake    Reviewed: Allergy & Precautions, H&P , NPO status , Patient's Chart, lab work & pertinent test results  Airway Mallampati: II  TM Distance: >3 FB Neck ROM: Full    Dental no notable dental hx.    Pulmonary sleep apnea    Pulmonary exam normal breath sounds clear to auscultation       Cardiovascular hypertension, Pt. on medications + Peripheral Vascular Disease  Normal cardiovascular exam Rhythm:Regular Rate:Normal     Neuro/Psych negative neurological ROS  negative psych ROS   GI/Hepatic negative GI ROS, Neg liver ROS,,,  Endo/Other    Morbid obesity  Renal/GU Renal InsufficiencyRenal disease  negative genitourinary   Musculoskeletal  (+) Arthritis , Osteoarthritis,    Abdominal  (+) + obese  Peds negative pediatric ROS (+)  Hematology  (+) Blood dyscrasia, anemia   Anesthesia Other Findings   Reproductive/Obstetrics negative OB ROS                             Anesthesia Physical Anesthesia Plan  ASA: 4  Anesthesia Plan: General   Post-op Pain Management: Minimal or no pain anticipated   Induction: Intravenous  PONV Risk Score and Plan: 2 and Ondansetron, Treatment may vary due to age or medical condition and Midazolam  Airway Management Planned: LMA  Additional Equipment:   Intra-op Plan:   Post-operative Plan: Extubation in OR  Informed Consent: I have reviewed the patients History and Physical, chart, labs and discussed the procedure including the risks, benefits and alternatives for the proposed anesthesia with the patient or authorized representative who has indicated his/her understanding and acceptance.     Dental advisory given  Plan Discussed with: CRNA  Anesthesia Plan Comments:        Anesthesia Quick Evaluation

## 2022-04-03 NOTE — Anesthesia Postprocedure Evaluation (Signed)
Anesthesia Post Note  Patient: Hector Brooks  Procedure(s) Performed: MRI THORASIC SPINE WITH ANESTHESIA     Patient location during evaluation: PACU Anesthesia Type: General Level of consciousness: awake and alert Pain management: pain level controlled Vital Signs Assessment: post-procedure vital signs reviewed and stable Respiratory status: spontaneous breathing, nonlabored ventilation and respiratory function stable Cardiovascular status: blood pressure returned to baseline and stable Postop Assessment: no apparent nausea or vomiting Anesthetic complications: no   No notable events documented.  Last Vitals:  Vitals:   04/03/22 1030 04/03/22 1040  BP: (!) 151/87 (!) 149/86  Pulse: 96 96  Resp: 15 19  Temp:    SpO2: 93% 94%    Last Pain:  Vitals:   04/03/22 0636  TempSrc: Oral                 Lynda Rainwater

## 2022-04-03 NOTE — Transfer of Care (Signed)
Immediate Anesthesia Transfer of Care Note  Patient: Hector Brooks  Procedure(s) Performed: MRI THORASIC SPINE WITH ANESTHESIA  Patient Location: PACU  Anesthesia Type:General  Level of Consciousness: drowsy and patient cooperative  Airway & Oxygen Therapy: Patient Spontanous Breathing  Post-op Assessment: Report given to RN and Post -op Vital signs reviewed and stable  Post vital signs: Reviewed and stable  Last Vitals:  Vitals Value Taken Time  BP    Temp    Pulse 105 04/03/22 1007  Resp 15 04/03/22 1007  SpO2 94 % 04/03/22 1007  Vitals shown include unvalidated device data.  Last Pain:  Vitals:   04/03/22 0636  TempSrc: Oral         Complications: No notable events documented.

## 2022-04-03 NOTE — Anesthesia Procedure Notes (Signed)
Procedure Name: LMA Insertion Date/Time: 04/03/2022 8:38 AM  Performed by: Lance Coon, CRNAPre-anesthesia Checklist: Patient identified, Emergency Drugs available, Suction available, Timeout performed and Patient being monitored Patient Re-evaluated:Patient Re-evaluated prior to induction Oxygen Delivery Method: Circle system utilized Preoxygenation: Pre-oxygenation with 100% oxygen Induction Type: IV induction LMA: LMA inserted LMA Size: 4.0 Number of attempts: 1 Placement Confirmation: positive ETCO2 and breath sounds checked- equal and bilateral Tube secured with: Tape Dental Injury: Teeth and Oropharynx as per pre-operative assessment

## 2022-04-04 ENCOUNTER — Encounter (HOSPITAL_COMMUNITY): Payer: Self-pay | Admitting: Radiology

## 2022-04-04 DIAGNOSIS — N2581 Secondary hyperparathyroidism of renal origin: Secondary | ICD-10-CM | POA: Diagnosis not present

## 2022-04-04 DIAGNOSIS — E440 Moderate protein-calorie malnutrition: Secondary | ICD-10-CM | POA: Diagnosis not present

## 2022-04-04 DIAGNOSIS — N186 End stage renal disease: Secondary | ICD-10-CM | POA: Diagnosis not present

## 2022-04-04 DIAGNOSIS — Z79899 Other long term (current) drug therapy: Secondary | ICD-10-CM | POA: Diagnosis not present

## 2022-04-04 DIAGNOSIS — E875 Hyperkalemia: Secondary | ICD-10-CM | POA: Diagnosis not present

## 2022-04-04 DIAGNOSIS — D509 Iron deficiency anemia, unspecified: Secondary | ICD-10-CM | POA: Diagnosis not present

## 2022-04-04 DIAGNOSIS — E878 Other disorders of electrolyte and fluid balance, not elsewhere classified: Secondary | ICD-10-CM | POA: Diagnosis not present

## 2022-04-04 DIAGNOSIS — Z992 Dependence on renal dialysis: Secondary | ICD-10-CM | POA: Diagnosis not present

## 2022-04-04 DIAGNOSIS — R82998 Other abnormal findings in urine: Secondary | ICD-10-CM | POA: Diagnosis not present

## 2022-04-04 DIAGNOSIS — R7309 Other abnormal glucose: Secondary | ICD-10-CM | POA: Diagnosis not present

## 2022-04-04 DIAGNOSIS — R17 Unspecified jaundice: Secondary | ICD-10-CM | POA: Diagnosis not present

## 2022-04-04 DIAGNOSIS — E872 Acidosis, unspecified: Secondary | ICD-10-CM | POA: Diagnosis not present

## 2022-04-05 DIAGNOSIS — Z79899 Other long term (current) drug therapy: Secondary | ICD-10-CM | POA: Diagnosis not present

## 2022-04-05 DIAGNOSIS — D509 Iron deficiency anemia, unspecified: Secondary | ICD-10-CM | POA: Diagnosis not present

## 2022-04-05 DIAGNOSIS — N2581 Secondary hyperparathyroidism of renal origin: Secondary | ICD-10-CM | POA: Diagnosis not present

## 2022-04-05 DIAGNOSIS — N186 End stage renal disease: Secondary | ICD-10-CM | POA: Diagnosis not present

## 2022-04-05 DIAGNOSIS — R17 Unspecified jaundice: Secondary | ICD-10-CM | POA: Diagnosis not present

## 2022-04-05 DIAGNOSIS — Z992 Dependence on renal dialysis: Secondary | ICD-10-CM | POA: Diagnosis not present

## 2022-04-05 DIAGNOSIS — E440 Moderate protein-calorie malnutrition: Secondary | ICD-10-CM | POA: Diagnosis not present

## 2022-04-05 DIAGNOSIS — R7309 Other abnormal glucose: Secondary | ICD-10-CM | POA: Diagnosis not present

## 2022-04-05 DIAGNOSIS — E878 Other disorders of electrolyte and fluid balance, not elsewhere classified: Secondary | ICD-10-CM | POA: Diagnosis not present

## 2022-04-05 DIAGNOSIS — E872 Acidosis, unspecified: Secondary | ICD-10-CM | POA: Diagnosis not present

## 2022-04-05 DIAGNOSIS — R82998 Other abnormal findings in urine: Secondary | ICD-10-CM | POA: Diagnosis not present

## 2022-04-05 DIAGNOSIS — E875 Hyperkalemia: Secondary | ICD-10-CM | POA: Diagnosis not present

## 2022-04-09 DIAGNOSIS — N186 End stage renal disease: Secondary | ICD-10-CM | POA: Diagnosis not present

## 2022-04-09 DIAGNOSIS — R82998 Other abnormal findings in urine: Secondary | ICD-10-CM | POA: Diagnosis not present

## 2022-04-09 DIAGNOSIS — Z992 Dependence on renal dialysis: Secondary | ICD-10-CM | POA: Diagnosis not present

## 2022-04-09 DIAGNOSIS — N2581 Secondary hyperparathyroidism of renal origin: Secondary | ICD-10-CM | POA: Diagnosis not present

## 2022-04-09 DIAGNOSIS — E440 Moderate protein-calorie malnutrition: Secondary | ICD-10-CM | POA: Diagnosis not present

## 2022-04-09 DIAGNOSIS — Z79899 Other long term (current) drug therapy: Secondary | ICD-10-CM | POA: Diagnosis not present

## 2022-04-09 DIAGNOSIS — D509 Iron deficiency anemia, unspecified: Secondary | ICD-10-CM | POA: Diagnosis not present

## 2022-04-09 DIAGNOSIS — E872 Acidosis, unspecified: Secondary | ICD-10-CM | POA: Diagnosis not present

## 2022-04-09 DIAGNOSIS — R7309 Other abnormal glucose: Secondary | ICD-10-CM | POA: Diagnosis not present

## 2022-04-09 DIAGNOSIS — E878 Other disorders of electrolyte and fluid balance, not elsewhere classified: Secondary | ICD-10-CM | POA: Diagnosis not present

## 2022-04-09 DIAGNOSIS — E875 Hyperkalemia: Secondary | ICD-10-CM | POA: Diagnosis not present

## 2022-04-09 DIAGNOSIS — R17 Unspecified jaundice: Secondary | ICD-10-CM | POA: Diagnosis not present

## 2022-04-11 DIAGNOSIS — R82998 Other abnormal findings in urine: Secondary | ICD-10-CM | POA: Diagnosis not present

## 2022-04-11 DIAGNOSIS — E440 Moderate protein-calorie malnutrition: Secondary | ICD-10-CM | POA: Diagnosis not present

## 2022-04-11 DIAGNOSIS — E878 Other disorders of electrolyte and fluid balance, not elsewhere classified: Secondary | ICD-10-CM | POA: Diagnosis not present

## 2022-04-11 DIAGNOSIS — R7309 Other abnormal glucose: Secondary | ICD-10-CM | POA: Diagnosis not present

## 2022-04-11 DIAGNOSIS — Z992 Dependence on renal dialysis: Secondary | ICD-10-CM | POA: Diagnosis not present

## 2022-04-11 DIAGNOSIS — N2581 Secondary hyperparathyroidism of renal origin: Secondary | ICD-10-CM | POA: Diagnosis not present

## 2022-04-11 DIAGNOSIS — R17 Unspecified jaundice: Secondary | ICD-10-CM | POA: Diagnosis not present

## 2022-04-11 DIAGNOSIS — E875 Hyperkalemia: Secondary | ICD-10-CM | POA: Diagnosis not present

## 2022-04-11 DIAGNOSIS — Z79899 Other long term (current) drug therapy: Secondary | ICD-10-CM | POA: Diagnosis not present

## 2022-04-11 DIAGNOSIS — N186 End stage renal disease: Secondary | ICD-10-CM | POA: Diagnosis not present

## 2022-04-11 DIAGNOSIS — D509 Iron deficiency anemia, unspecified: Secondary | ICD-10-CM | POA: Diagnosis not present

## 2022-04-11 DIAGNOSIS — E872 Acidosis, unspecified: Secondary | ICD-10-CM | POA: Diagnosis not present

## 2022-04-12 DIAGNOSIS — E878 Other disorders of electrolyte and fluid balance, not elsewhere classified: Secondary | ICD-10-CM | POA: Diagnosis not present

## 2022-04-12 DIAGNOSIS — D509 Iron deficiency anemia, unspecified: Secondary | ICD-10-CM | POA: Diagnosis not present

## 2022-04-12 DIAGNOSIS — R17 Unspecified jaundice: Secondary | ICD-10-CM | POA: Diagnosis not present

## 2022-04-12 DIAGNOSIS — E875 Hyperkalemia: Secondary | ICD-10-CM | POA: Diagnosis not present

## 2022-04-12 DIAGNOSIS — Z79899 Other long term (current) drug therapy: Secondary | ICD-10-CM | POA: Diagnosis not present

## 2022-04-12 DIAGNOSIS — E872 Acidosis, unspecified: Secondary | ICD-10-CM | POA: Diagnosis not present

## 2022-04-12 DIAGNOSIS — E440 Moderate protein-calorie malnutrition: Secondary | ICD-10-CM | POA: Diagnosis not present

## 2022-04-12 DIAGNOSIS — R82998 Other abnormal findings in urine: Secondary | ICD-10-CM | POA: Diagnosis not present

## 2022-04-12 DIAGNOSIS — Z992 Dependence on renal dialysis: Secondary | ICD-10-CM | POA: Diagnosis not present

## 2022-04-12 DIAGNOSIS — N2581 Secondary hyperparathyroidism of renal origin: Secondary | ICD-10-CM | POA: Diagnosis not present

## 2022-04-12 DIAGNOSIS — N186 End stage renal disease: Secondary | ICD-10-CM | POA: Diagnosis not present

## 2022-04-12 DIAGNOSIS — R7309 Other abnormal glucose: Secondary | ICD-10-CM | POA: Diagnosis not present

## 2022-04-16 DIAGNOSIS — E878 Other disorders of electrolyte and fluid balance, not elsewhere classified: Secondary | ICD-10-CM | POA: Diagnosis not present

## 2022-04-16 DIAGNOSIS — R7309 Other abnormal glucose: Secondary | ICD-10-CM | POA: Diagnosis not present

## 2022-04-16 DIAGNOSIS — R17 Unspecified jaundice: Secondary | ICD-10-CM | POA: Diagnosis not present

## 2022-04-16 DIAGNOSIS — Z992 Dependence on renal dialysis: Secondary | ICD-10-CM | POA: Diagnosis not present

## 2022-04-16 DIAGNOSIS — Z79899 Other long term (current) drug therapy: Secondary | ICD-10-CM | POA: Diagnosis not present

## 2022-04-16 DIAGNOSIS — D509 Iron deficiency anemia, unspecified: Secondary | ICD-10-CM | POA: Diagnosis not present

## 2022-04-16 DIAGNOSIS — N2581 Secondary hyperparathyroidism of renal origin: Secondary | ICD-10-CM | POA: Diagnosis not present

## 2022-04-16 DIAGNOSIS — N186 End stage renal disease: Secondary | ICD-10-CM | POA: Diagnosis not present

## 2022-04-16 DIAGNOSIS — R82998 Other abnormal findings in urine: Secondary | ICD-10-CM | POA: Diagnosis not present

## 2022-04-16 DIAGNOSIS — E872 Acidosis, unspecified: Secondary | ICD-10-CM | POA: Diagnosis not present

## 2022-04-16 DIAGNOSIS — E440 Moderate protein-calorie malnutrition: Secondary | ICD-10-CM | POA: Diagnosis not present

## 2022-04-16 DIAGNOSIS — E875 Hyperkalemia: Secondary | ICD-10-CM | POA: Diagnosis not present

## 2022-04-17 DIAGNOSIS — Z992 Dependence on renal dialysis: Secondary | ICD-10-CM | POA: Diagnosis not present

## 2022-04-17 DIAGNOSIS — E440 Moderate protein-calorie malnutrition: Secondary | ICD-10-CM | POA: Diagnosis not present

## 2022-04-17 DIAGNOSIS — E872 Acidosis, unspecified: Secondary | ICD-10-CM | POA: Diagnosis not present

## 2022-04-17 DIAGNOSIS — N2581 Secondary hyperparathyroidism of renal origin: Secondary | ICD-10-CM | POA: Diagnosis not present

## 2022-04-17 DIAGNOSIS — R82998 Other abnormal findings in urine: Secondary | ICD-10-CM | POA: Diagnosis not present

## 2022-04-17 DIAGNOSIS — E875 Hyperkalemia: Secondary | ICD-10-CM | POA: Diagnosis not present

## 2022-04-17 DIAGNOSIS — D509 Iron deficiency anemia, unspecified: Secondary | ICD-10-CM | POA: Diagnosis not present

## 2022-04-17 DIAGNOSIS — E878 Other disorders of electrolyte and fluid balance, not elsewhere classified: Secondary | ICD-10-CM | POA: Diagnosis not present

## 2022-04-17 DIAGNOSIS — Z79899 Other long term (current) drug therapy: Secondary | ICD-10-CM | POA: Diagnosis not present

## 2022-04-17 DIAGNOSIS — R17 Unspecified jaundice: Secondary | ICD-10-CM | POA: Diagnosis not present

## 2022-04-17 DIAGNOSIS — R7309 Other abnormal glucose: Secondary | ICD-10-CM | POA: Diagnosis not present

## 2022-04-17 DIAGNOSIS — N186 End stage renal disease: Secondary | ICD-10-CM | POA: Diagnosis not present

## 2022-04-19 DIAGNOSIS — E875 Hyperkalemia: Secondary | ICD-10-CM | POA: Diagnosis not present

## 2022-04-19 DIAGNOSIS — R82998 Other abnormal findings in urine: Secondary | ICD-10-CM | POA: Diagnosis not present

## 2022-04-19 DIAGNOSIS — R17 Unspecified jaundice: Secondary | ICD-10-CM | POA: Diagnosis not present

## 2022-04-19 DIAGNOSIS — R7309 Other abnormal glucose: Secondary | ICD-10-CM | POA: Diagnosis not present

## 2022-04-19 DIAGNOSIS — N186 End stage renal disease: Secondary | ICD-10-CM | POA: Diagnosis not present

## 2022-04-19 DIAGNOSIS — Z79899 Other long term (current) drug therapy: Secondary | ICD-10-CM | POA: Diagnosis not present

## 2022-04-19 DIAGNOSIS — E872 Acidosis, unspecified: Secondary | ICD-10-CM | POA: Diagnosis not present

## 2022-04-19 DIAGNOSIS — D509 Iron deficiency anemia, unspecified: Secondary | ICD-10-CM | POA: Diagnosis not present

## 2022-04-19 DIAGNOSIS — Z992 Dependence on renal dialysis: Secondary | ICD-10-CM | POA: Diagnosis not present

## 2022-04-19 DIAGNOSIS — N2581 Secondary hyperparathyroidism of renal origin: Secondary | ICD-10-CM | POA: Diagnosis not present

## 2022-04-19 DIAGNOSIS — E440 Moderate protein-calorie malnutrition: Secondary | ICD-10-CM | POA: Diagnosis not present

## 2022-04-19 DIAGNOSIS — E878 Other disorders of electrolyte and fluid balance, not elsewhere classified: Secondary | ICD-10-CM | POA: Diagnosis not present

## 2022-04-20 DIAGNOSIS — R82998 Other abnormal findings in urine: Secondary | ICD-10-CM | POA: Diagnosis not present

## 2022-04-20 DIAGNOSIS — N2581 Secondary hyperparathyroidism of renal origin: Secondary | ICD-10-CM | POA: Diagnosis not present

## 2022-04-20 DIAGNOSIS — D509 Iron deficiency anemia, unspecified: Secondary | ICD-10-CM | POA: Diagnosis not present

## 2022-04-20 DIAGNOSIS — N186 End stage renal disease: Secondary | ICD-10-CM | POA: Diagnosis not present

## 2022-04-20 DIAGNOSIS — E440 Moderate protein-calorie malnutrition: Secondary | ICD-10-CM | POA: Diagnosis not present

## 2022-04-20 DIAGNOSIS — E875 Hyperkalemia: Secondary | ICD-10-CM | POA: Diagnosis not present

## 2022-04-20 DIAGNOSIS — Z79899 Other long term (current) drug therapy: Secondary | ICD-10-CM | POA: Diagnosis not present

## 2022-04-20 DIAGNOSIS — E878 Other disorders of electrolyte and fluid balance, not elsewhere classified: Secondary | ICD-10-CM | POA: Diagnosis not present

## 2022-04-20 DIAGNOSIS — E872 Acidosis, unspecified: Secondary | ICD-10-CM | POA: Diagnosis not present

## 2022-04-20 DIAGNOSIS — R17 Unspecified jaundice: Secondary | ICD-10-CM | POA: Diagnosis not present

## 2022-04-20 DIAGNOSIS — R7309 Other abnormal glucose: Secondary | ICD-10-CM | POA: Diagnosis not present

## 2022-04-20 DIAGNOSIS — Z992 Dependence on renal dialysis: Secondary | ICD-10-CM | POA: Diagnosis not present

## 2022-04-23 DIAGNOSIS — E872 Acidosis, unspecified: Secondary | ICD-10-CM | POA: Diagnosis not present

## 2022-04-23 DIAGNOSIS — R17 Unspecified jaundice: Secondary | ICD-10-CM | POA: Diagnosis not present

## 2022-04-23 DIAGNOSIS — Z79899 Other long term (current) drug therapy: Secondary | ICD-10-CM | POA: Diagnosis not present

## 2022-04-23 DIAGNOSIS — R82998 Other abnormal findings in urine: Secondary | ICD-10-CM | POA: Diagnosis not present

## 2022-04-23 DIAGNOSIS — E875 Hyperkalemia: Secondary | ICD-10-CM | POA: Diagnosis not present

## 2022-04-23 DIAGNOSIS — N2581 Secondary hyperparathyroidism of renal origin: Secondary | ICD-10-CM | POA: Diagnosis not present

## 2022-04-23 DIAGNOSIS — R7309 Other abnormal glucose: Secondary | ICD-10-CM | POA: Diagnosis not present

## 2022-04-23 DIAGNOSIS — Z992 Dependence on renal dialysis: Secondary | ICD-10-CM | POA: Diagnosis not present

## 2022-04-23 DIAGNOSIS — N186 End stage renal disease: Secondary | ICD-10-CM | POA: Diagnosis not present

## 2022-04-23 DIAGNOSIS — D509 Iron deficiency anemia, unspecified: Secondary | ICD-10-CM | POA: Diagnosis not present

## 2022-04-23 DIAGNOSIS — E440 Moderate protein-calorie malnutrition: Secondary | ICD-10-CM | POA: Diagnosis not present

## 2022-04-23 DIAGNOSIS — E878 Other disorders of electrolyte and fluid balance, not elsewhere classified: Secondary | ICD-10-CM | POA: Diagnosis not present

## 2022-04-25 DIAGNOSIS — E440 Moderate protein-calorie malnutrition: Secondary | ICD-10-CM | POA: Diagnosis not present

## 2022-04-25 DIAGNOSIS — Z992 Dependence on renal dialysis: Secondary | ICD-10-CM | POA: Diagnosis not present

## 2022-04-25 DIAGNOSIS — Z79899 Other long term (current) drug therapy: Secondary | ICD-10-CM | POA: Diagnosis not present

## 2022-04-25 DIAGNOSIS — E878 Other disorders of electrolyte and fluid balance, not elsewhere classified: Secondary | ICD-10-CM | POA: Diagnosis not present

## 2022-04-25 DIAGNOSIS — E875 Hyperkalemia: Secondary | ICD-10-CM | POA: Diagnosis not present

## 2022-04-25 DIAGNOSIS — R17 Unspecified jaundice: Secondary | ICD-10-CM | POA: Diagnosis not present

## 2022-04-25 DIAGNOSIS — N186 End stage renal disease: Secondary | ICD-10-CM | POA: Diagnosis not present

## 2022-04-25 DIAGNOSIS — N2581 Secondary hyperparathyroidism of renal origin: Secondary | ICD-10-CM | POA: Diagnosis not present

## 2022-04-25 DIAGNOSIS — R7309 Other abnormal glucose: Secondary | ICD-10-CM | POA: Diagnosis not present

## 2022-04-25 DIAGNOSIS — E872 Acidosis, unspecified: Secondary | ICD-10-CM | POA: Diagnosis not present

## 2022-04-25 DIAGNOSIS — R82998 Other abnormal findings in urine: Secondary | ICD-10-CM | POA: Diagnosis not present

## 2022-04-25 DIAGNOSIS — D509 Iron deficiency anemia, unspecified: Secondary | ICD-10-CM | POA: Diagnosis not present

## 2022-04-26 DIAGNOSIS — N2581 Secondary hyperparathyroidism of renal origin: Secondary | ICD-10-CM | POA: Diagnosis not present

## 2022-04-26 DIAGNOSIS — R7309 Other abnormal glucose: Secondary | ICD-10-CM | POA: Diagnosis not present

## 2022-04-26 DIAGNOSIS — R17 Unspecified jaundice: Secondary | ICD-10-CM | POA: Diagnosis not present

## 2022-04-26 DIAGNOSIS — E872 Acidosis, unspecified: Secondary | ICD-10-CM | POA: Diagnosis not present

## 2022-04-26 DIAGNOSIS — Z79899 Other long term (current) drug therapy: Secondary | ICD-10-CM | POA: Diagnosis not present

## 2022-04-26 DIAGNOSIS — E875 Hyperkalemia: Secondary | ICD-10-CM | POA: Diagnosis not present

## 2022-04-26 DIAGNOSIS — N186 End stage renal disease: Secondary | ICD-10-CM | POA: Diagnosis not present

## 2022-04-26 DIAGNOSIS — D509 Iron deficiency anemia, unspecified: Secondary | ICD-10-CM | POA: Diagnosis not present

## 2022-04-26 DIAGNOSIS — E440 Moderate protein-calorie malnutrition: Secondary | ICD-10-CM | POA: Diagnosis not present

## 2022-04-26 DIAGNOSIS — E878 Other disorders of electrolyte and fluid balance, not elsewhere classified: Secondary | ICD-10-CM | POA: Diagnosis not present

## 2022-04-26 DIAGNOSIS — R82998 Other abnormal findings in urine: Secondary | ICD-10-CM | POA: Diagnosis not present

## 2022-04-26 DIAGNOSIS — Z992 Dependence on renal dialysis: Secondary | ICD-10-CM | POA: Diagnosis not present

## 2022-04-27 DIAGNOSIS — N186 End stage renal disease: Secondary | ICD-10-CM | POA: Diagnosis not present

## 2022-04-27 DIAGNOSIS — D509 Iron deficiency anemia, unspecified: Secondary | ICD-10-CM | POA: Diagnosis not present

## 2022-04-27 DIAGNOSIS — E440 Moderate protein-calorie malnutrition: Secondary | ICD-10-CM | POA: Diagnosis not present

## 2022-04-27 DIAGNOSIS — E872 Acidosis, unspecified: Secondary | ICD-10-CM | POA: Diagnosis not present

## 2022-04-27 DIAGNOSIS — R82998 Other abnormal findings in urine: Secondary | ICD-10-CM | POA: Diagnosis not present

## 2022-04-27 DIAGNOSIS — R7309 Other abnormal glucose: Secondary | ICD-10-CM | POA: Diagnosis not present

## 2022-04-27 DIAGNOSIS — E875 Hyperkalemia: Secondary | ICD-10-CM | POA: Diagnosis not present

## 2022-04-27 DIAGNOSIS — R17 Unspecified jaundice: Secondary | ICD-10-CM | POA: Diagnosis not present

## 2022-04-27 DIAGNOSIS — N2581 Secondary hyperparathyroidism of renal origin: Secondary | ICD-10-CM | POA: Diagnosis not present

## 2022-04-27 DIAGNOSIS — Z79899 Other long term (current) drug therapy: Secondary | ICD-10-CM | POA: Diagnosis not present

## 2022-04-27 DIAGNOSIS — E878 Other disorders of electrolyte and fluid balance, not elsewhere classified: Secondary | ICD-10-CM | POA: Diagnosis not present

## 2022-04-27 DIAGNOSIS — Z992 Dependence on renal dialysis: Secondary | ICD-10-CM | POA: Diagnosis not present

## 2022-04-30 DIAGNOSIS — D509 Iron deficiency anemia, unspecified: Secondary | ICD-10-CM | POA: Diagnosis not present

## 2022-04-30 DIAGNOSIS — R17 Unspecified jaundice: Secondary | ICD-10-CM | POA: Diagnosis not present

## 2022-04-30 DIAGNOSIS — N186 End stage renal disease: Secondary | ICD-10-CM | POA: Diagnosis not present

## 2022-04-30 DIAGNOSIS — R7309 Other abnormal glucose: Secondary | ICD-10-CM | POA: Diagnosis not present

## 2022-04-30 DIAGNOSIS — E878 Other disorders of electrolyte and fluid balance, not elsewhere classified: Secondary | ICD-10-CM | POA: Diagnosis not present

## 2022-04-30 DIAGNOSIS — Z992 Dependence on renal dialysis: Secondary | ICD-10-CM | POA: Diagnosis not present

## 2022-04-30 DIAGNOSIS — E875 Hyperkalemia: Secondary | ICD-10-CM | POA: Diagnosis not present

## 2022-04-30 DIAGNOSIS — E440 Moderate protein-calorie malnutrition: Secondary | ICD-10-CM | POA: Diagnosis not present

## 2022-04-30 DIAGNOSIS — N2581 Secondary hyperparathyroidism of renal origin: Secondary | ICD-10-CM | POA: Diagnosis not present

## 2022-04-30 DIAGNOSIS — Z79899 Other long term (current) drug therapy: Secondary | ICD-10-CM | POA: Diagnosis not present

## 2022-04-30 DIAGNOSIS — R82998 Other abnormal findings in urine: Secondary | ICD-10-CM | POA: Diagnosis not present

## 2022-04-30 DIAGNOSIS — E872 Acidosis, unspecified: Secondary | ICD-10-CM | POA: Diagnosis not present

## 2022-05-02 DIAGNOSIS — R82998 Other abnormal findings in urine: Secondary | ICD-10-CM | POA: Diagnosis not present

## 2022-05-02 DIAGNOSIS — D509 Iron deficiency anemia, unspecified: Secondary | ICD-10-CM | POA: Diagnosis not present

## 2022-05-02 DIAGNOSIS — N186 End stage renal disease: Secondary | ICD-10-CM | POA: Diagnosis not present

## 2022-05-02 DIAGNOSIS — Z992 Dependence on renal dialysis: Secondary | ICD-10-CM | POA: Diagnosis not present

## 2022-05-02 DIAGNOSIS — N2589 Other disorders resulting from impaired renal tubular function: Secondary | ICD-10-CM | POA: Diagnosis not present

## 2022-05-02 DIAGNOSIS — D631 Anemia in chronic kidney disease: Secondary | ICD-10-CM | POA: Diagnosis not present

## 2022-05-02 DIAGNOSIS — E875 Hyperkalemia: Secondary | ICD-10-CM | POA: Diagnosis not present

## 2022-05-02 DIAGNOSIS — N2581 Secondary hyperparathyroidism of renal origin: Secondary | ICD-10-CM | POA: Diagnosis not present

## 2022-05-03 DIAGNOSIS — N2589 Other disorders resulting from impaired renal tubular function: Secondary | ICD-10-CM | POA: Diagnosis not present

## 2022-05-03 DIAGNOSIS — D509 Iron deficiency anemia, unspecified: Secondary | ICD-10-CM | POA: Diagnosis not present

## 2022-05-03 DIAGNOSIS — Z992 Dependence on renal dialysis: Secondary | ICD-10-CM | POA: Diagnosis not present

## 2022-05-03 DIAGNOSIS — D631 Anemia in chronic kidney disease: Secondary | ICD-10-CM | POA: Diagnosis not present

## 2022-05-03 DIAGNOSIS — N186 End stage renal disease: Secondary | ICD-10-CM | POA: Diagnosis not present

## 2022-05-03 DIAGNOSIS — N2581 Secondary hyperparathyroidism of renal origin: Secondary | ICD-10-CM | POA: Diagnosis not present

## 2022-05-03 DIAGNOSIS — R82998 Other abnormal findings in urine: Secondary | ICD-10-CM | POA: Diagnosis not present

## 2022-05-03 DIAGNOSIS — E875 Hyperkalemia: Secondary | ICD-10-CM | POA: Diagnosis not present

## 2022-05-05 DIAGNOSIS — R82998 Other abnormal findings in urine: Secondary | ICD-10-CM | POA: Diagnosis not present

## 2022-05-05 DIAGNOSIS — N186 End stage renal disease: Secondary | ICD-10-CM | POA: Diagnosis not present

## 2022-05-05 DIAGNOSIS — N2589 Other disorders resulting from impaired renal tubular function: Secondary | ICD-10-CM | POA: Diagnosis not present

## 2022-05-05 DIAGNOSIS — E875 Hyperkalemia: Secondary | ICD-10-CM | POA: Diagnosis not present

## 2022-05-05 DIAGNOSIS — D631 Anemia in chronic kidney disease: Secondary | ICD-10-CM | POA: Diagnosis not present

## 2022-05-05 DIAGNOSIS — N2581 Secondary hyperparathyroidism of renal origin: Secondary | ICD-10-CM | POA: Diagnosis not present

## 2022-05-05 DIAGNOSIS — Z992 Dependence on renal dialysis: Secondary | ICD-10-CM | POA: Diagnosis not present

## 2022-05-05 DIAGNOSIS — D509 Iron deficiency anemia, unspecified: Secondary | ICD-10-CM | POA: Diagnosis not present

## 2022-05-07 DIAGNOSIS — N186 End stage renal disease: Secondary | ICD-10-CM | POA: Diagnosis not present

## 2022-05-07 DIAGNOSIS — R82998 Other abnormal findings in urine: Secondary | ICD-10-CM | POA: Diagnosis not present

## 2022-05-07 DIAGNOSIS — E875 Hyperkalemia: Secondary | ICD-10-CM | POA: Diagnosis not present

## 2022-05-07 DIAGNOSIS — N2581 Secondary hyperparathyroidism of renal origin: Secondary | ICD-10-CM | POA: Diagnosis not present

## 2022-05-07 DIAGNOSIS — N2589 Other disorders resulting from impaired renal tubular function: Secondary | ICD-10-CM | POA: Diagnosis not present

## 2022-05-07 DIAGNOSIS — Z992 Dependence on renal dialysis: Secondary | ICD-10-CM | POA: Diagnosis not present

## 2022-05-07 DIAGNOSIS — D631 Anemia in chronic kidney disease: Secondary | ICD-10-CM | POA: Diagnosis not present

## 2022-05-07 DIAGNOSIS — D509 Iron deficiency anemia, unspecified: Secondary | ICD-10-CM | POA: Diagnosis not present

## 2022-05-08 DIAGNOSIS — N2589 Other disorders resulting from impaired renal tubular function: Secondary | ICD-10-CM | POA: Diagnosis not present

## 2022-05-08 DIAGNOSIS — R82998 Other abnormal findings in urine: Secondary | ICD-10-CM | POA: Diagnosis not present

## 2022-05-08 DIAGNOSIS — Z992 Dependence on renal dialysis: Secondary | ICD-10-CM | POA: Diagnosis not present

## 2022-05-08 DIAGNOSIS — E875 Hyperkalemia: Secondary | ICD-10-CM | POA: Diagnosis not present

## 2022-05-08 DIAGNOSIS — N186 End stage renal disease: Secondary | ICD-10-CM | POA: Diagnosis not present

## 2022-05-08 DIAGNOSIS — D631 Anemia in chronic kidney disease: Secondary | ICD-10-CM | POA: Diagnosis not present

## 2022-05-08 DIAGNOSIS — N2581 Secondary hyperparathyroidism of renal origin: Secondary | ICD-10-CM | POA: Diagnosis not present

## 2022-05-08 DIAGNOSIS — D509 Iron deficiency anemia, unspecified: Secondary | ICD-10-CM | POA: Diagnosis not present

## 2022-05-11 DIAGNOSIS — D631 Anemia in chronic kidney disease: Secondary | ICD-10-CM | POA: Diagnosis not present

## 2022-05-11 DIAGNOSIS — E875 Hyperkalemia: Secondary | ICD-10-CM | POA: Diagnosis not present

## 2022-05-11 DIAGNOSIS — N2581 Secondary hyperparathyroidism of renal origin: Secondary | ICD-10-CM | POA: Diagnosis not present

## 2022-05-11 DIAGNOSIS — Z992 Dependence on renal dialysis: Secondary | ICD-10-CM | POA: Diagnosis not present

## 2022-05-11 DIAGNOSIS — R82998 Other abnormal findings in urine: Secondary | ICD-10-CM | POA: Diagnosis not present

## 2022-05-11 DIAGNOSIS — N2589 Other disorders resulting from impaired renal tubular function: Secondary | ICD-10-CM | POA: Diagnosis not present

## 2022-05-11 DIAGNOSIS — D509 Iron deficiency anemia, unspecified: Secondary | ICD-10-CM | POA: Diagnosis not present

## 2022-05-11 DIAGNOSIS — N186 End stage renal disease: Secondary | ICD-10-CM | POA: Diagnosis not present

## 2022-05-12 DIAGNOSIS — R82998 Other abnormal findings in urine: Secondary | ICD-10-CM | POA: Diagnosis not present

## 2022-05-12 DIAGNOSIS — Z992 Dependence on renal dialysis: Secondary | ICD-10-CM | POA: Diagnosis not present

## 2022-05-12 DIAGNOSIS — N186 End stage renal disease: Secondary | ICD-10-CM | POA: Diagnosis not present

## 2022-05-12 DIAGNOSIS — E875 Hyperkalemia: Secondary | ICD-10-CM | POA: Diagnosis not present

## 2022-05-12 DIAGNOSIS — D509 Iron deficiency anemia, unspecified: Secondary | ICD-10-CM | POA: Diagnosis not present

## 2022-05-12 DIAGNOSIS — N2589 Other disorders resulting from impaired renal tubular function: Secondary | ICD-10-CM | POA: Diagnosis not present

## 2022-05-12 DIAGNOSIS — N2581 Secondary hyperparathyroidism of renal origin: Secondary | ICD-10-CM | POA: Diagnosis not present

## 2022-05-12 DIAGNOSIS — D631 Anemia in chronic kidney disease: Secondary | ICD-10-CM | POA: Diagnosis not present

## 2022-05-13 DIAGNOSIS — D509 Iron deficiency anemia, unspecified: Secondary | ICD-10-CM | POA: Diagnosis not present

## 2022-05-13 DIAGNOSIS — N2581 Secondary hyperparathyroidism of renal origin: Secondary | ICD-10-CM | POA: Diagnosis not present

## 2022-05-13 DIAGNOSIS — N186 End stage renal disease: Secondary | ICD-10-CM | POA: Diagnosis not present

## 2022-05-13 DIAGNOSIS — N2589 Other disorders resulting from impaired renal tubular function: Secondary | ICD-10-CM | POA: Diagnosis not present

## 2022-05-13 DIAGNOSIS — D631 Anemia in chronic kidney disease: Secondary | ICD-10-CM | POA: Diagnosis not present

## 2022-05-13 DIAGNOSIS — R82998 Other abnormal findings in urine: Secondary | ICD-10-CM | POA: Diagnosis not present

## 2022-05-13 DIAGNOSIS — Z992 Dependence on renal dialysis: Secondary | ICD-10-CM | POA: Diagnosis not present

## 2022-05-13 DIAGNOSIS — E875 Hyperkalemia: Secondary | ICD-10-CM | POA: Diagnosis not present

## 2022-05-14 DIAGNOSIS — D631 Anemia in chronic kidney disease: Secondary | ICD-10-CM | POA: Diagnosis not present

## 2022-05-14 DIAGNOSIS — N2589 Other disorders resulting from impaired renal tubular function: Secondary | ICD-10-CM | POA: Diagnosis not present

## 2022-05-14 DIAGNOSIS — R82998 Other abnormal findings in urine: Secondary | ICD-10-CM | POA: Diagnosis not present

## 2022-05-14 DIAGNOSIS — Z992 Dependence on renal dialysis: Secondary | ICD-10-CM | POA: Diagnosis not present

## 2022-05-14 DIAGNOSIS — N186 End stage renal disease: Secondary | ICD-10-CM | POA: Diagnosis not present

## 2022-05-14 DIAGNOSIS — E875 Hyperkalemia: Secondary | ICD-10-CM | POA: Diagnosis not present

## 2022-05-14 DIAGNOSIS — D509 Iron deficiency anemia, unspecified: Secondary | ICD-10-CM | POA: Diagnosis not present

## 2022-05-14 DIAGNOSIS — N2581 Secondary hyperparathyroidism of renal origin: Secondary | ICD-10-CM | POA: Diagnosis not present

## 2022-05-16 DIAGNOSIS — D509 Iron deficiency anemia, unspecified: Secondary | ICD-10-CM | POA: Diagnosis not present

## 2022-05-16 DIAGNOSIS — E875 Hyperkalemia: Secondary | ICD-10-CM | POA: Diagnosis not present

## 2022-05-16 DIAGNOSIS — R82998 Other abnormal findings in urine: Secondary | ICD-10-CM | POA: Diagnosis not present

## 2022-05-16 DIAGNOSIS — Z992 Dependence on renal dialysis: Secondary | ICD-10-CM | POA: Diagnosis not present

## 2022-05-16 DIAGNOSIS — N2581 Secondary hyperparathyroidism of renal origin: Secondary | ICD-10-CM | POA: Diagnosis not present

## 2022-05-16 DIAGNOSIS — N2589 Other disorders resulting from impaired renal tubular function: Secondary | ICD-10-CM | POA: Diagnosis not present

## 2022-05-16 DIAGNOSIS — D631 Anemia in chronic kidney disease: Secondary | ICD-10-CM | POA: Diagnosis not present

## 2022-05-16 DIAGNOSIS — N186 End stage renal disease: Secondary | ICD-10-CM | POA: Diagnosis not present

## 2022-05-17 DIAGNOSIS — N2589 Other disorders resulting from impaired renal tubular function: Secondary | ICD-10-CM | POA: Diagnosis not present

## 2022-05-17 DIAGNOSIS — R82998 Other abnormal findings in urine: Secondary | ICD-10-CM | POA: Diagnosis not present

## 2022-05-17 DIAGNOSIS — D509 Iron deficiency anemia, unspecified: Secondary | ICD-10-CM | POA: Diagnosis not present

## 2022-05-17 DIAGNOSIS — Z992 Dependence on renal dialysis: Secondary | ICD-10-CM | POA: Diagnosis not present

## 2022-05-17 DIAGNOSIS — D631 Anemia in chronic kidney disease: Secondary | ICD-10-CM | POA: Diagnosis not present

## 2022-05-17 DIAGNOSIS — N186 End stage renal disease: Secondary | ICD-10-CM | POA: Diagnosis not present

## 2022-05-17 DIAGNOSIS — N2581 Secondary hyperparathyroidism of renal origin: Secondary | ICD-10-CM | POA: Diagnosis not present

## 2022-05-17 DIAGNOSIS — E875 Hyperkalemia: Secondary | ICD-10-CM | POA: Diagnosis not present

## 2022-05-19 ENCOUNTER — Ambulatory Visit (INDEPENDENT_AMBULATORY_CARE_PROVIDER_SITE_OTHER): Payer: Medicare Other | Admitting: Internal Medicine

## 2022-05-19 ENCOUNTER — Encounter: Payer: Self-pay | Admitting: Internal Medicine

## 2022-05-19 VITALS — BP 135/96 | HR 117 | Ht 69.0 in | Wt 299.0 lb

## 2022-05-19 DIAGNOSIS — Z992 Dependence on renal dialysis: Secondary | ICD-10-CM | POA: Diagnosis not present

## 2022-05-19 DIAGNOSIS — R82998 Other abnormal findings in urine: Secondary | ICD-10-CM | POA: Diagnosis not present

## 2022-05-19 DIAGNOSIS — D631 Anemia in chronic kidney disease: Secondary | ICD-10-CM | POA: Diagnosis not present

## 2022-05-19 DIAGNOSIS — N2589 Other disorders resulting from impaired renal tubular function: Secondary | ICD-10-CM | POA: Diagnosis not present

## 2022-05-19 DIAGNOSIS — Z23 Encounter for immunization: Secondary | ICD-10-CM

## 2022-05-19 DIAGNOSIS — I825Z1 Chronic embolism and thrombosis of unspecified deep veins of right distal lower extremity: Secondary | ICD-10-CM | POA: Diagnosis not present

## 2022-05-19 DIAGNOSIS — I10 Essential (primary) hypertension: Secondary | ICD-10-CM

## 2022-05-19 DIAGNOSIS — D509 Iron deficiency anemia, unspecified: Secondary | ICD-10-CM | POA: Diagnosis not present

## 2022-05-19 DIAGNOSIS — N186 End stage renal disease: Secondary | ICD-10-CM | POA: Diagnosis not present

## 2022-05-19 DIAGNOSIS — N2581 Secondary hyperparathyroidism of renal origin: Secondary | ICD-10-CM | POA: Diagnosis not present

## 2022-05-19 DIAGNOSIS — E875 Hyperkalemia: Secondary | ICD-10-CM | POA: Diagnosis not present

## 2022-05-19 NOTE — Progress Notes (Signed)
New Patient Office Visit     CC/Reason for Visit: Establish care, discuss chronic and acute concerns Previous PCP: Unknown Last Visit: Years ago  HPI: Hector Brooks is a 52 y.o. male who is coming in today for the above mentioned reasons. Past Medical History is significant for: Morbid obesity, end-stage renal disease on home hemodialysis 4 days a week.  He also has a history of hypertension and morbid obesity.  He is followed by [facility].  He had a colonoscopy in December 2023 and was advised 3-year follow-up.  He has never had a shingles vaccination.  He is feeling well.   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   ESRD (end stage renal disease) on dialysis ([place]) 04/02/2022   stage 5 -on home dialysis 4 times a week   History of torn meniscus of right knee    HLD (hyperlipidemia)    HTN (hypertension)    Hx of blood clots    Hyperkalemia 05/09/2021   Lower extremity edema    Metabolic acidosis [id number]   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   Sleep apnea    does not use cpap   Vitamin D deficiency     Past Surgical History:  Procedure Laterality Date   AV FISTULA PLACEMENT Left 02/09/2020   Procedure: LEFT ARM FISTULA CREATION;  Surgeon: Serafina Mitchell, MD;  [facility];  Service: Vascular;  Laterality: Left;   COLONOSCOPY WITH PROPOFOL N/A 02/10/2022   Procedure: COLONOSCOPY WITH PROPOFOL;  Surgeon: Yetta Flock, MD;  Location: [place];  Service: Gastroenterology;  Laterality: N/A;   INSERTION OF DIALYSIS CATHETER Right 05/22/2020   Procedure: INSERTION OF TUNNELED DIALYSIS CATHETER;  Surgeon: Angelia Mould, MD;  Location: [place];  Service: Vascular;  Laterality: Right;    MENISCUS REPAIR Right 12/14   POLYPECTOMY  02/10/2022   Procedure: POLYPECTOMY;  Surgeon: Yetta Flock, MD;  Location: [facility] ENDOSCOPY;  Service: Gastroenterology;;   RADIOLOGY WITH ANESTHESIA N/A 04/03/2022   Procedure: MRI THORASIC SPINE WITH ANESTHESIA;  Surgeon: Radiologist, Medication, MD;  Location: [place];  Service: Radiology;  Laterality: N/A;    Social History:  reports that he has never smoked. He has never used smokeless tobacco. He reports that he does not drink alcohol and does not use drugs.  Allergies: No Known Allergies  Family History:  Family History  Problem Relation Age of Onset   Prostate cancer Father    Diabetes Father    Drug abuse Father    Hyperlipidemia Sister    Stomach cancer Maternal Grandmother    Diabetes Maternal Grandfather    Heart disease Maternal Grandfather      Current Outpatient Medications:    acetaminophen (TYLENOL) 500 MG tablet, Take 1,000 mg by mouth every 6 (six) hours as needed for headache., Disp: , Rfl:    calcitRIOL (ROCALTROL) 0.25 MCG capsule, Take 0.25 mcg by mouth in the morning., Disp: , Rfl:    calcium acetate (PHOSLO) 667 MG capsule, Take 2,001 mg by mouth See admin instructions. Take 3 capsules (2001 mg) by mouth with each meal and with each snacks, Disp: , Rfl:    hydrALAZINE (APRESOLINE) 100 MG tablet, Take 100 mg by mouth 2 (two) times  daily., Disp: , Rfl:    NIFEdipine (PROCARDIA XL/NIFEDICAL-XL) 90 MG 24 hr tablet, Take 90 mg by mouth at bedtime., Disp: , Rfl:    olmesartan (BENICAR) 40 MG tablet, Take 40 mg by mouth at bedtime., Disp: , Rfl:   Review of Systems:  Negative except as indicated in HPI.   Physical Exam: Vitals:   05/19/22 1329 05/19/22 1333  BP: (!) 137/93 (!) 135/96  Pulse: (!) 117   SpO2: 98%   Weight: 299 lb (135.6 kg)   Height: 5\' 9"  (1.753 m)    Body mass index is 44.15 kg/m.  Physical Exam Vitals reviewed.  Constitutional:      Appearance: Normal appearance.  HENT:     Head:  Normocephalic and atraumatic.  Eyes:     Conjunctiva/sclera: Conjunctivae normal.     Pupils: Pupils are equal, round, and reactive to light.  Cardiovascular:     Rate and Rhythm: Normal rate and regular rhythm.  Pulmonary:     Effort: Pulmonary effort is normal.     Breath sounds: Normal breath sounds.  Skin:    General: Skin is warm and dry.  Neurological:     General: No focal deficit present.     Mental Status: He is alert and oriented to person, place, and time.  Psychiatric:        Mood and Affect: Mood normal.        Behavior: Behavior normal.        Thought Content: Thought content normal.        Judgment: Judgment normal.       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  Followed by Dr. Posey Pronto at [facility].  He does home hemodialysis 4 days a week. -For blood pressure he remains on olmesartan, hydralazine, nifedipine. -He is overdue for labs and wellness visit, he will schedule at his convenience. -Chronic DVT that was provoked after knee surgery for which he received 6 months of anticoagulation, nothing further is needed. -He will receive his first shingles vaccine today.    Lelon Frohlich, MD [facility] at [facility]

## 2022-05-20 DIAGNOSIS — N2581 Secondary hyperparathyroidism of renal origin: Secondary | ICD-10-CM | POA: Diagnosis not present

## 2022-05-20 DIAGNOSIS — R82998 Other abnormal findings in urine: Secondary | ICD-10-CM | POA: Diagnosis not present

## 2022-05-20 DIAGNOSIS — N2589 Other disorders resulting from impaired renal tubular function: Secondary | ICD-10-CM | POA: Diagnosis not present

## 2022-05-20 DIAGNOSIS — D631 Anemia in chronic kidney disease: Secondary | ICD-10-CM | POA: Diagnosis not present

## 2022-05-20 DIAGNOSIS — N186 End stage renal disease: Secondary | ICD-10-CM | POA: Diagnosis not present

## 2022-05-20 DIAGNOSIS — E875 Hyperkalemia: Secondary | ICD-10-CM | POA: Diagnosis not present

## 2022-05-20 DIAGNOSIS — Z992 Dependence on renal dialysis: Secondary | ICD-10-CM | POA: Diagnosis not present

## 2022-05-20 DIAGNOSIS — D509 Iron deficiency anemia, unspecified: Secondary | ICD-10-CM | POA: Diagnosis not present

## 2022-05-22 DIAGNOSIS — E611 Iron deficiency: Secondary | ICD-10-CM | POA: Diagnosis not present

## 2022-05-22 DIAGNOSIS — N186 End stage renal disease: Secondary | ICD-10-CM | POA: Diagnosis not present

## 2022-05-22 DIAGNOSIS — Z992 Dependence on renal dialysis: Secondary | ICD-10-CM | POA: Diagnosis not present

## 2022-05-22 DIAGNOSIS — E875 Hyperkalemia: Secondary | ICD-10-CM | POA: Diagnosis not present

## 2022-05-22 DIAGNOSIS — N2581 Secondary hyperparathyroidism of renal origin: Secondary | ICD-10-CM | POA: Diagnosis not present

## 2022-05-22 DIAGNOSIS — D649 Anemia, unspecified: Secondary | ICD-10-CM | POA: Diagnosis not present

## 2022-05-24 DIAGNOSIS — Z992 Dependence on renal dialysis: Secondary | ICD-10-CM | POA: Diagnosis not present

## 2022-05-24 DIAGNOSIS — N186 End stage renal disease: Secondary | ICD-10-CM | POA: Diagnosis not present

## 2022-05-24 DIAGNOSIS — N2581 Secondary hyperparathyroidism of renal origin: Secondary | ICD-10-CM | POA: Diagnosis not present

## 2022-05-24 DIAGNOSIS — E611 Iron deficiency: Secondary | ICD-10-CM | POA: Diagnosis not present

## 2022-05-24 DIAGNOSIS — D649 Anemia, unspecified: Secondary | ICD-10-CM | POA: Diagnosis not present

## 2022-05-24 DIAGNOSIS — E875 Hyperkalemia: Secondary | ICD-10-CM | POA: Diagnosis not present

## 2022-05-27 DIAGNOSIS — E611 Iron deficiency: Secondary | ICD-10-CM | POA: Diagnosis not present

## 2022-05-27 DIAGNOSIS — N2581 Secondary hyperparathyroidism of renal origin: Secondary | ICD-10-CM | POA: Diagnosis not present

## 2022-05-27 DIAGNOSIS — D649 Anemia, unspecified: Secondary | ICD-10-CM | POA: Diagnosis not present

## 2022-05-27 DIAGNOSIS — Z992 Dependence on renal dialysis: Secondary | ICD-10-CM | POA: Diagnosis not present

## 2022-05-27 DIAGNOSIS — E875 Hyperkalemia: Secondary | ICD-10-CM | POA: Diagnosis not present

## 2022-05-27 DIAGNOSIS — N186 End stage renal disease: Secondary | ICD-10-CM | POA: Diagnosis not present

## 2022-05-29 DIAGNOSIS — E875 Hyperkalemia: Secondary | ICD-10-CM | POA: Diagnosis not present

## 2022-05-29 DIAGNOSIS — D649 Anemia, unspecified: Secondary | ICD-10-CM | POA: Diagnosis not present

## 2022-05-29 DIAGNOSIS — N2581 Secondary hyperparathyroidism of renal origin: Secondary | ICD-10-CM | POA: Diagnosis not present

## 2022-05-29 DIAGNOSIS — Z992 Dependence on renal dialysis: Secondary | ICD-10-CM | POA: Diagnosis not present

## 2022-05-29 DIAGNOSIS — E611 Iron deficiency: Secondary | ICD-10-CM | POA: Diagnosis not present

## 2022-05-29 DIAGNOSIS — N186 End stage renal disease: Secondary | ICD-10-CM | POA: Diagnosis not present

## 2022-05-30 DIAGNOSIS — E875 Hyperkalemia: Secondary | ICD-10-CM | POA: Diagnosis not present

## 2022-05-30 DIAGNOSIS — D649 Anemia, unspecified: Secondary | ICD-10-CM | POA: Diagnosis not present

## 2022-05-30 DIAGNOSIS — N2581 Secondary hyperparathyroidism of renal origin: Secondary | ICD-10-CM | POA: Diagnosis not present

## 2022-05-30 DIAGNOSIS — N186 End stage renal disease: Secondary | ICD-10-CM | POA: Diagnosis not present

## 2022-05-30 DIAGNOSIS — E611 Iron deficiency: Secondary | ICD-10-CM | POA: Diagnosis not present

## 2022-05-30 DIAGNOSIS — Z992 Dependence on renal dialysis: Secondary | ICD-10-CM | POA: Diagnosis not present

## 2022-06-01 DIAGNOSIS — N049 Nephrotic syndrome with unspecified morphologic changes: Secondary | ICD-10-CM | POA: Diagnosis not present

## 2022-06-01 DIAGNOSIS — N186 End stage renal disease: Secondary | ICD-10-CM | POA: Diagnosis not present

## 2022-06-01 DIAGNOSIS — Z992 Dependence on renal dialysis: Secondary | ICD-10-CM | POA: Diagnosis not present

## 2022-06-02 DIAGNOSIS — E611 Iron deficiency: Secondary | ICD-10-CM | POA: Diagnosis not present

## 2022-06-02 DIAGNOSIS — Z992 Dependence on renal dialysis: Secondary | ICD-10-CM | POA: Diagnosis not present

## 2022-06-02 DIAGNOSIS — N186 End stage renal disease: Secondary | ICD-10-CM | POA: Diagnosis not present

## 2022-06-05 DIAGNOSIS — D631 Anemia in chronic kidney disease: Secondary | ICD-10-CM | POA: Diagnosis not present

## 2022-06-05 DIAGNOSIS — N186 End stage renal disease: Secondary | ICD-10-CM | POA: Diagnosis not present

## 2022-06-05 DIAGNOSIS — Z992 Dependence on renal dialysis: Secondary | ICD-10-CM | POA: Diagnosis not present

## 2022-06-05 DIAGNOSIS — E875 Hyperkalemia: Secondary | ICD-10-CM | POA: Diagnosis not present

## 2022-06-05 DIAGNOSIS — Z79899 Other long term (current) drug therapy: Secondary | ICD-10-CM | POA: Diagnosis not present

## 2022-06-05 DIAGNOSIS — E440 Moderate protein-calorie malnutrition: Secondary | ICD-10-CM | POA: Diagnosis not present

## 2022-06-05 DIAGNOSIS — N2581 Secondary hyperparathyroidism of renal origin: Secondary | ICD-10-CM | POA: Diagnosis not present

## 2022-06-05 DIAGNOSIS — R17 Unspecified jaundice: Secondary | ICD-10-CM | POA: Diagnosis not present

## 2022-06-05 DIAGNOSIS — D509 Iron deficiency anemia, unspecified: Secondary | ICD-10-CM | POA: Diagnosis not present

## 2022-06-05 DIAGNOSIS — R82998 Other abnormal findings in urine: Secondary | ICD-10-CM | POA: Diagnosis not present

## 2022-06-06 DIAGNOSIS — N186 End stage renal disease: Secondary | ICD-10-CM | POA: Diagnosis not present

## 2022-06-06 DIAGNOSIS — Z79899 Other long term (current) drug therapy: Secondary | ICD-10-CM | POA: Diagnosis not present

## 2022-06-06 DIAGNOSIS — D509 Iron deficiency anemia, unspecified: Secondary | ICD-10-CM | POA: Diagnosis not present

## 2022-06-06 DIAGNOSIS — R82998 Other abnormal findings in urine: Secondary | ICD-10-CM | POA: Diagnosis not present

## 2022-06-06 DIAGNOSIS — N2581 Secondary hyperparathyroidism of renal origin: Secondary | ICD-10-CM | POA: Diagnosis not present

## 2022-06-06 DIAGNOSIS — Z992 Dependence on renal dialysis: Secondary | ICD-10-CM | POA: Diagnosis not present

## 2022-06-06 DIAGNOSIS — R17 Unspecified jaundice: Secondary | ICD-10-CM | POA: Diagnosis not present

## 2022-06-06 DIAGNOSIS — E875 Hyperkalemia: Secondary | ICD-10-CM | POA: Diagnosis not present

## 2022-06-06 DIAGNOSIS — D631 Anemia in chronic kidney disease: Secondary | ICD-10-CM | POA: Diagnosis not present

## 2022-06-06 DIAGNOSIS — E440 Moderate protein-calorie malnutrition: Secondary | ICD-10-CM | POA: Diagnosis not present

## 2022-06-08 DIAGNOSIS — E440 Moderate protein-calorie malnutrition: Secondary | ICD-10-CM | POA: Diagnosis not present

## 2022-06-08 DIAGNOSIS — R82998 Other abnormal findings in urine: Secondary | ICD-10-CM | POA: Diagnosis not present

## 2022-06-08 DIAGNOSIS — Z79899 Other long term (current) drug therapy: Secondary | ICD-10-CM | POA: Diagnosis not present

## 2022-06-08 DIAGNOSIS — R17 Unspecified jaundice: Secondary | ICD-10-CM | POA: Diagnosis not present

## 2022-06-08 DIAGNOSIS — N2581 Secondary hyperparathyroidism of renal origin: Secondary | ICD-10-CM | POA: Diagnosis not present

## 2022-06-08 DIAGNOSIS — N186 End stage renal disease: Secondary | ICD-10-CM | POA: Diagnosis not present

## 2022-06-08 DIAGNOSIS — D509 Iron deficiency anemia, unspecified: Secondary | ICD-10-CM | POA: Diagnosis not present

## 2022-06-08 DIAGNOSIS — D631 Anemia in chronic kidney disease: Secondary | ICD-10-CM | POA: Diagnosis not present

## 2022-06-08 DIAGNOSIS — E875 Hyperkalemia: Secondary | ICD-10-CM | POA: Diagnosis not present

## 2022-06-08 DIAGNOSIS — Z992 Dependence on renal dialysis: Secondary | ICD-10-CM | POA: Diagnosis not present

## 2022-06-10 DIAGNOSIS — Z79899 Other long term (current) drug therapy: Secondary | ICD-10-CM | POA: Diagnosis not present

## 2022-06-10 DIAGNOSIS — R17 Unspecified jaundice: Secondary | ICD-10-CM | POA: Diagnosis not present

## 2022-06-10 DIAGNOSIS — D509 Iron deficiency anemia, unspecified: Secondary | ICD-10-CM | POA: Diagnosis not present

## 2022-06-10 DIAGNOSIS — Z992 Dependence on renal dialysis: Secondary | ICD-10-CM | POA: Diagnosis not present

## 2022-06-10 DIAGNOSIS — N186 End stage renal disease: Secondary | ICD-10-CM | POA: Diagnosis not present

## 2022-06-10 DIAGNOSIS — E440 Moderate protein-calorie malnutrition: Secondary | ICD-10-CM | POA: Diagnosis not present

## 2022-06-10 DIAGNOSIS — D631 Anemia in chronic kidney disease: Secondary | ICD-10-CM | POA: Diagnosis not present

## 2022-06-10 DIAGNOSIS — E875 Hyperkalemia: Secondary | ICD-10-CM | POA: Diagnosis not present

## 2022-06-10 DIAGNOSIS — R82998 Other abnormal findings in urine: Secondary | ICD-10-CM | POA: Diagnosis not present

## 2022-06-10 DIAGNOSIS — N2581 Secondary hyperparathyroidism of renal origin: Secondary | ICD-10-CM | POA: Diagnosis not present

## 2022-06-11 DIAGNOSIS — N186 End stage renal disease: Secondary | ICD-10-CM | POA: Diagnosis not present

## 2022-06-11 DIAGNOSIS — D509 Iron deficiency anemia, unspecified: Secondary | ICD-10-CM | POA: Diagnosis not present

## 2022-06-11 DIAGNOSIS — Z992 Dependence on renal dialysis: Secondary | ICD-10-CM | POA: Diagnosis not present

## 2022-06-11 DIAGNOSIS — N2581 Secondary hyperparathyroidism of renal origin: Secondary | ICD-10-CM | POA: Diagnosis not present

## 2022-06-11 DIAGNOSIS — Z79899 Other long term (current) drug therapy: Secondary | ICD-10-CM | POA: Diagnosis not present

## 2022-06-11 DIAGNOSIS — E440 Moderate protein-calorie malnutrition: Secondary | ICD-10-CM | POA: Diagnosis not present

## 2022-06-11 DIAGNOSIS — D631 Anemia in chronic kidney disease: Secondary | ICD-10-CM | POA: Diagnosis not present

## 2022-06-11 DIAGNOSIS — E875 Hyperkalemia: Secondary | ICD-10-CM | POA: Diagnosis not present

## 2022-06-11 DIAGNOSIS — R17 Unspecified jaundice: Secondary | ICD-10-CM | POA: Diagnosis not present

## 2022-06-11 DIAGNOSIS — R82998 Other abnormal findings in urine: Secondary | ICD-10-CM | POA: Diagnosis not present

## 2022-06-12 ENCOUNTER — Ambulatory Visit (INDEPENDENT_AMBULATORY_CARE_PROVIDER_SITE_OTHER): Payer: Medicare Other | Admitting: Internal Medicine

## 2022-06-12 ENCOUNTER — Encounter: Payer: Self-pay | Admitting: Internal Medicine

## 2022-06-12 VITALS — BP 158/101 | HR 111 | Temp 98.6°F | Ht 68.5 in | Wt 301.1 lb

## 2022-06-12 DIAGNOSIS — R7303 Prediabetes: Secondary | ICD-10-CM

## 2022-06-12 DIAGNOSIS — Z0100 Encounter for examination of eyes and vision without abnormal findings: Secondary | ICD-10-CM

## 2022-06-12 DIAGNOSIS — E7800 Pure hypercholesterolemia, unspecified: Secondary | ICD-10-CM | POA: Diagnosis not present

## 2022-06-12 DIAGNOSIS — I10 Essential (primary) hypertension: Secondary | ICD-10-CM

## 2022-06-12 DIAGNOSIS — N186 End stage renal disease: Secondary | ICD-10-CM | POA: Diagnosis not present

## 2022-06-12 DIAGNOSIS — Z0000 Encounter for general adult medical examination without abnormal findings: Secondary | ICD-10-CM

## 2022-06-12 DIAGNOSIS — Z992 Dependence on renal dialysis: Secondary | ICD-10-CM

## 2022-06-12 MED ORDER — HYDRALAZINE HCL 100 MG PO TABS
100.0000 mg | ORAL_TABLET | Freq: Two times a day (BID) | ORAL | 1 refills | Status: AC
Start: 2022-06-12 — End: ?

## 2022-06-12 MED ORDER — NIFEDIPINE ER OSMOTIC RELEASE 90 MG PO TB24
90.0000 mg | ORAL_TABLET | Freq: Every day | ORAL | 1 refills | Status: DC
Start: 2022-06-12 — End: 2023-01-21

## 2022-06-12 MED ORDER — CALCITRIOL 0.25 MCG PO CAPS
0.2500 ug | ORAL_CAPSULE | Freq: Every morning | ORAL | 1 refills | Status: AC
Start: 2022-06-12 — End: ?

## 2022-06-12 MED ORDER — OLMESARTAN MEDOXOMIL 40 MG PO TABS
40.0000 mg | ORAL_TABLET | Freq: Every day | ORAL | 1 refills | Status: AC
Start: 2022-06-12 — End: ?

## 2022-06-12 NOTE — Progress Notes (Signed)
Established Patient Office Visit     CC/Reason for Visit: Annual preventive exam and welcome to Medicare visit  HPI: Hector Brooks is a 52 y.o. male who is coming in today for the above mentioned reasons. Past Medical History is significant for: Morbid obesity, end-stage renal disease on home hemodialysis 4 days a week. He also has a history of hypertension.  He is overdue for eye and dental care.  He is due for second shingles vaccine.  He had a colonoscopy in December 2023.  He feels well today and has no acute concerns or complaints.   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   Sleep apnea    does not use cpap   Vitamin D deficiency     Past Surgical History:  Procedure Laterality Date   AV FISTULA PLACEMENT Left 02/09/2020   Procedure: LEFT ARM FISTULA CREATION;  Surgeon: Nada LibmanBrabham, Vance W, MD;  [facility];  Service: Vascular;  Laterality: Left;   COLONOSCOPY WITH PROPOFOL N/A 02/10/2022   Procedure: COLONOSCOPY WITH PROPOFOL;  Surgeon: Benancio DeedsArmbruster, Steven P, MD;  Location: [facility] ENDOSCOPY;  Service: Gastroenterology;  Laterality: N/A;   INSERTION OF DIALYSIS CATHETER Right 05/22/2020   Procedure: INSERTION OF TUNNELED DIALYSIS CATHETER;  Surgeon: Chuck Hintickson, Christopher S, MD;  [facility];  Service: Vascular;  Laterality: Right;   MENISCUS REPAIR Right 12/14   POLYPECTOMY  02/10/2022    Procedure: POLYPECTOMY;  Surgeon: Benancio DeedsArmbruster, Steven P, MD;  [facility];  Service: Gastroenterology;;   RADIOLOGY WITH ANESTHESIA N/A 04/03/2022   Procedure: MRI THORASIC SPINE WITH ANESTHESIA;  Surgeon: Radiologist, Medication, MD;  [facility] OR;  Service: Radiology;  Laterality: N/A;    Social History:  reports that he has never smoked. He has never used smokeless tobacco. He reports that he does not drink alcohol and does not use drugs.  Allergies: No Known Allergies  Family History:  Family History  Problem Relation Age of Onset   Prostate cancer Father    Diabetes Father    Drug abuse Father    Hyperlipidemia Sister    Stomach cancer Maternal Grandmother    Diabetes Maternal Grandfather    Heart disease Maternal Grandfather      Current Outpatient Medications:    acetaminophen (TYLENOL) 500 MG tablet, Take 1,000 mg by mouth every 6 (six) hours as needed for headache., Disp: , Rfl:    calcium acetate (PHOSLO) 667 MG capsule, Take 2,001 mg by mouth See admin instructions. Take 3 capsules (2001 mg) by mouth with each meal and with each snacks, Disp: , Rfl:    calcitRIOL (ROCALTROL) 0.25 MCG capsule, Take 1 capsule (0.25 mcg total) by mouth in the morning., Disp: 90 capsule, Rfl: 1   hydrALAZINE (APRESOLINE) 100 MG tablet, Take 1 tablet (100 mg total) by mouth 2 (two) times  daily., Disp: 180 tablet, Rfl: 1   NIFEdipine (PROCARDIA XL/NIFEDICAL-XL) 90 MG 24 hr tablet, Take 1 tablet (90 mg total) by mouth at bedtime., Disp: 90 tablet, Rfl: 1   olmesartan (BENICAR) 40 MG tablet, Take 1 tablet (40 mg total) by mouth at bedtime., Disp: 90 tablet, Rfl: 1  Review of Systems:  Negative unless indicated in HPI.   Physical Exam: Vitals:   06/12/22 0806 06/12/22 0810  BP: (!) 140/90 (!) 158/101  Pulse: (!) 111   Temp: 98.6 F (37 C)   TempSrc: Oral   SpO2: 95%   Weight: (!) 301 lb 1.6 oz (136.6 kg)   Height: 5' 8.5" (1.74 m)     Body mass index is 45.12  kg/m.   Physical Exam Vitals reviewed.  Constitutional:      General: He is not in acute distress.    Appearance: Normal appearance. He is not ill-appearing, toxic-appearing or diaphoretic.  HENT:     Head: Normocephalic.     Right Ear: Tympanic membrane, ear canal and external ear normal. There is no impacted cerumen.     Left Ear: Tympanic membrane, ear canal and external ear normal. There is no impacted cerumen.     Nose: Nose normal.     Mouth/Throat:     Mouth: Mucous membranes are moist.     Pharynx: Oropharynx is clear. No oropharyngeal exudate or posterior oropharyngeal erythema.  Eyes:     General: No scleral icterus.       Right eye: No discharge.        Left eye: No discharge.     Conjunctiva/sclera: Conjunctivae normal.     Pupils: Pupils are equal, round, and reactive to light.  Neck:     Vascular: No carotid bruit.  Cardiovascular:     Rate and Rhythm: Normal rate and regular rhythm.     Pulses: Normal pulses.     Heart sounds: Normal heart sounds.  Pulmonary:     Effort: Pulmonary effort is normal. No respiratory distress.     Breath sounds: Normal breath sounds.  Abdominal:     General: Abdomen is flat. Bowel sounds are normal.     Palpations: Abdomen is soft.  Musculoskeletal:        General: Swelling present. Normal range of motion.     Cervical back: Normal range of motion.  Skin:    General: Skin is warm and dry.  Neurological:     General: No focal deficit present.     Mental Status: He is alert and oriented to person, place, and time. Mental status is at baseline.  Psychiatric:        Mood and Affect: Mood normal.        Behavior: Behavior normal.        Thought Content: Thought content normal.        Judgment: Judgment normal.      Welcome to Medicare wellness visit   1. Risk factors, based on past  M,S,F - Cardiac Risk Factors include: diabetes mellitus;hypertension   2.  Physical activities: Dietary issues and exercise activities  discussed:  Current Exercise Habits: Home exercise routine, Type of exercise: walking, Time (Minutes): 40, Frequency (Times/Week): 3, Weekly Exercise (Minutes/Week): 120, Intensity: Moderate, Exercise limited by: orthopedic condition(s)   3.  Depression/mood:  Flowsheet Row Office Visit from 06/12/2022 in [facility] Total Score 0        4.  ADL's:    06/12/2022  7:57 AM 04/03/2022    7:07 AM  In your present state of health, do you have any difficulty performing the following activities:  Hearing? 0   Vision? 1   Difficulty concentrating or making decisions? 0   Walking or climbing stairs? 1   Comment right knee pain   Dressing or bathing? 0   Doing errands, shopping? 0 0  Preparing Food and eating ? N   Using the Toilet? N   In the past six months, have you accidently leaked urine? N   Do you have problems with loss of bowel control? N   Managing your Medications? N   Managing your Finances? N   Housekeeping or managing your Housekeeping? N      5.  Fall risk:     05/11/2021    7:40 PM 05/12/2021   10:00 AM 02/10/2022    7:11 AM 05/19/2022    2:34 PM 06/12/2022    8:00 AM  Fall Risk  Falls in the past year?    0 0  Was there an injury with Fall?    0 0  Fall Risk Category Calculator    0 0  (RETIRED) Patient Fall Risk Level Low fall risk Low fall risk Moderate fall risk    Patient at Risk for Falls Due to    No Fall Risks No Fall Risks  Fall risk Follow up    Falls evaluation completed Falls evaluation completed     6.  Home safety: No problems identified   7.  Height weight, and visual acuity: height and weight as above, vision/hearing: Vision Screening   Right eye Left eye Both eyes  Without correction 20/30 20/30 20/30   With correction        8.  Counseling: Counseling given: Not Answered    9. Lab orders based on risk factors: Laboratory update will be reviewed   10. Cognitive assessment:        06/12/2022     8:01 AM  6CIT Screen  What Year? 0 points  What month? 0 points  What time? 0 points  Count back from 20 0 points  Months in reverse 0 points  Repeat phrase 0 points  Total Score 0 points     11. Screening: Patient provided with a written and personalized 5-10 year screening schedule in the AVS. Health Maintenance  Topic Date Due   COVID-19 Vaccine (3 - Pfizer risk series) 06/28/2022*   Zoster (Shingles) Vaccine (2 of 2) 07/14/2022   Flu Shot  10/02/2022   Medicare Annual Wellness Visit  06/12/2023   DTaP/Tdap/Td vaccine (2 - Td or Tdap) 08/25/2023   Colon Cancer Screening  02/11/2032   Hepatitis C Screening: USPSTF Recommendation to screen - Ages 18-79 yo.  Completed   HIV Screening  Completed   HPV Vaccine  Aged Out  *Topic was postponed. The date shown is not the original due date.    12. Provider List Update: Patient Care Team    Relationship Specialty Notifications Start End  Philip Aspen, Limmie Patricia, MD PCP - General Internal Medicine  05/19/22   Center, [facility]    03/10/22    Comment: Home Dialysis [id number]     13. Advance Directives: Does Patient Have a Medical Advance Directive?: No Would patient like information on creating a medical advance directive?: No - Patient declined  14. Opioids: Patient is not on any opioid prescriptions and has no risk factors for a substance use disorder.   15.  Goals      Weight (lb) < 200 lb (90.7 kg)         I have personally reviewed and noted the following in the patient's chart:   Medical and social history Use of alcohol, tobacco or illicit drugs  Current medications and supplements Functional ability and status Nutritional status Physical activity Advanced directives List of other physicians Hospitalizations, surgeries, and ER visits in previous 12 months Vitals Screenings to include cognitive, depression, and falls Referrals and appointments  In addition, I have reviewed and discussed with  patient certain preventive protocols, quality metrics, and best practice recommendations. A written personalized care plan for preventive services as well as general preventive health recommendations were provided to patient.  Impression and Plan:  Welcome to Medicare preventive visit - Plan: PSA  Primary hypertension - Plan: CBC with Differential/Platelet, Comprehensive metabolic panel, hydrALAZINE (APRESOLINE) 100 MG tablet, NIFEdipine (PROCARDIA XL/NIFEDICAL-XL) 90 MG 24 hr tablet, olmesartan (BENICAR) 40 MG tablet  Pure hypercholesterolemia - Plan: Lipid panel  Prediabetes - Plan: Hemoglobin A1c  ESRD on dialysis - Plan: calcitRIOL (ROCALTROL) 0.25 MCG capsule  Morbid obesity - Plan: TSH, Vitamin B12, Vitamin D, 25-hydroxy  Encounter for vision screening - Plan: Ambulatory referral to Ophthalmology  -Recommend routine eye and dental care. -Healthy lifestyle discussed in detail. -Labs to be updated today. -Prostate cancer screening: PSA today Health Maintenance  Topic Date Due   COVID-19 Vaccine (3 - Pfizer risk series) 06/28/2022*   Zoster (Shingles) Vaccine (2 of 2) 07/14/2022   Flu Shot  10/02/2022   Medicare Annual Wellness Visit  06/12/2023   DTaP/Tdap/Td vaccine (2 - Td or Tdap) 08/25/2023   Colon Cancer Screening  02/11/2032   Hepatitis C Screening: USPSTF Recommendation to screen - Ages 18-79 yo.  Completed   HIV Screening  Completed   HPV Vaccine  Aged Out  *Topic was postponed. The date shown is not the original due date.    -He will discuss elevated BP with his nephrologist since he is on hemodialysis.    Chaya Jan, MD [facility] at [facility]

## 2022-06-14 DIAGNOSIS — R17 Unspecified jaundice: Secondary | ICD-10-CM | POA: Diagnosis not present

## 2022-06-14 DIAGNOSIS — E875 Hyperkalemia: Secondary | ICD-10-CM | POA: Diagnosis not present

## 2022-06-14 DIAGNOSIS — N2581 Secondary hyperparathyroidism of renal origin: Secondary | ICD-10-CM | POA: Diagnosis not present

## 2022-06-14 DIAGNOSIS — D509 Iron deficiency anemia, unspecified: Secondary | ICD-10-CM | POA: Diagnosis not present

## 2022-06-14 DIAGNOSIS — N186 End stage renal disease: Secondary | ICD-10-CM | POA: Diagnosis not present

## 2022-06-14 DIAGNOSIS — E440 Moderate protein-calorie malnutrition: Secondary | ICD-10-CM | POA: Diagnosis not present

## 2022-06-14 DIAGNOSIS — Z992 Dependence on renal dialysis: Secondary | ICD-10-CM | POA: Diagnosis not present

## 2022-06-14 DIAGNOSIS — R82998 Other abnormal findings in urine: Secondary | ICD-10-CM | POA: Diagnosis not present

## 2022-06-14 DIAGNOSIS — D631 Anemia in chronic kidney disease: Secondary | ICD-10-CM | POA: Diagnosis not present

## 2022-06-14 DIAGNOSIS — Z79899 Other long term (current) drug therapy: Secondary | ICD-10-CM | POA: Diagnosis not present

## 2022-06-15 DIAGNOSIS — N2581 Secondary hyperparathyroidism of renal origin: Secondary | ICD-10-CM | POA: Diagnosis not present

## 2022-06-15 DIAGNOSIS — Z992 Dependence on renal dialysis: Secondary | ICD-10-CM | POA: Diagnosis not present

## 2022-06-15 DIAGNOSIS — D509 Iron deficiency anemia, unspecified: Secondary | ICD-10-CM | POA: Diagnosis not present

## 2022-06-15 DIAGNOSIS — D631 Anemia in chronic kidney disease: Secondary | ICD-10-CM | POA: Diagnosis not present

## 2022-06-15 DIAGNOSIS — R17 Unspecified jaundice: Secondary | ICD-10-CM | POA: Diagnosis not present

## 2022-06-15 DIAGNOSIS — R82998 Other abnormal findings in urine: Secondary | ICD-10-CM | POA: Diagnosis not present

## 2022-06-15 DIAGNOSIS — E440 Moderate protein-calorie malnutrition: Secondary | ICD-10-CM | POA: Diagnosis not present

## 2022-06-15 DIAGNOSIS — E875 Hyperkalemia: Secondary | ICD-10-CM | POA: Diagnosis not present

## 2022-06-15 DIAGNOSIS — N186 End stage renal disease: Secondary | ICD-10-CM | POA: Diagnosis not present

## 2022-06-15 DIAGNOSIS — Z79899 Other long term (current) drug therapy: Secondary | ICD-10-CM | POA: Diagnosis not present

## 2022-06-16 ENCOUNTER — Encounter: Payer: Self-pay | Admitting: Surgery

## 2022-06-16 ENCOUNTER — Ambulatory Visit: Payer: Medicare Other | Admitting: Surgery

## 2022-06-16 ENCOUNTER — Ambulatory Visit (HOSPITAL_COMMUNITY)
Admission: RE | Admit: 2022-06-16 | Discharge: 2022-06-16 | Disposition: A | Discharge status: Home / Self Care | Payer: Medicare Other | Source: Ambulatory Visit | Attending: Surgery | Admitting: Surgery

## 2022-06-16 VITALS — BP 148/99 | HR 111 | Temp 98.3°F | Resp 20 | Ht 68.5 in | Wt 297.0 lb

## 2022-06-16 DIAGNOSIS — N186 End stage renal disease: Secondary | ICD-10-CM

## 2022-06-16 DIAGNOSIS — R600 Localized edema: Secondary | ICD-10-CM | POA: Diagnosis not present

## 2022-06-16 DIAGNOSIS — Z992 Dependence on renal dialysis: Secondary | ICD-10-CM | POA: Diagnosis not present

## 2022-06-16 DIAGNOSIS — I872 Venous insufficiency (chronic) (peripheral): Secondary | ICD-10-CM

## 2022-06-16 NOTE — Progress Notes (Signed)
[facility]ist of [place]  Patient name: Hector Brooks MRN: [id number] DOB: 03-23-1970 Sex: male   REASON FOR VISIT:    Follow up  HISOTRY OF PRESENT ILLNESS:   Hector Brooks is a 52 y.o. male who is here today for evaluation of right leg DVT.  The patient states that approximately 9-10 years ago, he had right knee surgery, and in the postoperative period developed a right leg DVT.  This was treated with approximately 6 months of anticoagulation.  He also wore compression socks at that time.  He does not wear compression socks now.  He has leg swelling in both legs, the right is the worst.  He does not have any skin breakdown or ulcers.  He is back today for venous reflux study.  He has had no interval change.   The patient is on dialysis.  He dialyzes at home.  Left radiocephalic fistula.     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   Sleep apnea    does not use cpap   Vitamin D deficiency      FAMILY HISTORY:   Family History  Problem Relation Age of Onset   Prostate cancer Father    Diabetes Father    Drug abuse Father    Hyperlipidemia Sister    Stomach cancer Maternal Grandmother    Diabetes Maternal Grandfather    Heart disease Maternal Grandfather     SOCIAL HISTORY:   Social History   Tobacco Use   Smoking status: Never    Smokeless tobacco: Never  Substance Use Topics   Alcohol use: No    Alcohol/week: 0.0 standard drinks of alcohol     ALLERGIES:   No Known Allergies   CURRENT MEDICATIONS:   Current Outpatient Medications  Medication Sig Dispense Refill   acetaminophen (TYLENOL) 500 MG tablet Take 1,000 mg by mouth every 6 (six) hours as needed for headache.     calcitRIOL (ROCALTROL) 0.25 MCG capsule Take 1 capsule (0.25 mcg total) by mouth in the morning. 90 capsule 1   calcium acetate (PHOSLO) 667 MG capsule Take 2,001 mg by mouth See admin instructions. Take 3 capsules (2001 mg) by mouth with each meal and with each snacks     hydrALAZINE (APRESOLINE) 100 MG tablet Take 1 tablet (100 mg total) by mouth 2 (two) times daily. 180 tablet 1   NIFEdipine (PROCARDIA XL/NIFEDICAL-XL) 90 MG 24 hr tablet Take 1 tablet (90 mg total) by mouth at bedtime. 90 tablet 1   olmesartan (BENICAR) 40 MG tablet Take 1 tablet (40 mg total) by mouth at bedtime. 90 tablet 1   No current facility-administered medications for this visit.    REVIEW OF SYSTEMS:    denotes positive finding,  denotes negative finding Cardiac  Comments:  Chest pain or  chest pressure:    Shortness of breath upon exertion:    Short of breath when lying flat:    Irregular heart rhythm:        Vascular    Pain in calf, thigh, or hip brought on by ambulation:    Pain in feet at night that wakes you up from your sleep:     Blood clot in your veins:    Leg swelling:  x       Pulmonary    Oxygen at home:    Productive cough:     Wheezing:         Neurologic    Sudden weakness in arms or legs:     Sudden numbness in arms or legs:     Sudden onset of difficulty speaking or slurred speech:    Temporary loss of vision in one eye:     Problems with dizziness:         Gastrointestinal    Blood in stool:     Vomited blood:         Genitourinary    Burning when urinating:     Blood in urine:        Psychiatric    Major  depression:         Hematologic    Bleeding problems:    Problems with blood clotting too easily:        Skin    Rashes or ulcers:        Constitutional    Fever or chills:      PHYSICAL EXAM:   Vitals:   06/16/22 1344  BP: (!) 148/99  Pulse: (!) 111  Resp: 20  Temp: 98.3 F (36.8 C)  SpO2: 92%  Weight: 297 lb (134.7 kg)  Height: 5' 8.5" (1.74 m)    GENERAL: The patient is a well-nourished male, in no acute distress. The vital signs are documented above. CARDIAC: There is a regular rate and rhythm.  VASCULAR: Lateral lower extremity edema PULMONARY: Non-labored respirations MUSCULOSKELETAL: There are no major deformities or cyanosis. NEUROLOGIC: No focal weakness or paresthesias are detected. SKIN: There are no ulcers or rashes noted. PSYCHIATRIC: The patient has a normal affect.  STUDIES:   I have reviewed the following: Venous Reflux Times  +--------------+---------+------+-----------+------------+----------------+   RIGHT        Reflux NoRefluxReflux TimeDiameter cmsComments                                  Yes                                            +--------------+---------+------+-----------+------------+----------------+   CFV                    yes   >1 second                                +--------------+---------+------+-----------+------------+----------------+   FV prox       no                                                       +--------------+---------+------+-----------+------------+----------------+  FV mid        no                                                       +--------------+---------+------+-----------+------------+----------------+   FV dist       no                                    chronic  thrombus  +--------------+---------+------+-----------+------------+----------------+   Popliteal    no              >1 second             chronic  thrombus   +--------------+---------+------+-----------+------------+----------------+   GSV at SFJ              yes    >500 ms      0.73                       +--------------+---------+------+-----------+------------+----------------+   GSV prox thigh          yes    >500 ms      0.74                       +--------------+---------+------+-----------+------------+----------------+   GSV mid thigh no                            0.50                       +--------------+---------+------+-----------+------------+----------------+   GSV dist thighno                            0.56                       +--------------+---------+------+-----------+------------+----------------+   GSV at knee   no                            0.49                       +--------------+---------+------+-----------+------------+----------------+   GSV prox calf no                                                       +--------------+---------+------+-----------+------------+----------------+   SSV Pop Fossa           yes    >500 ms                                 +--------------+---------+------+-----------+------------+----------------+   SSV prox calf           yes    >500 ms                                 +--------------+---------+------+-----------+------------+----------------+  MEDICAL ISSUES:   CEAP class4: The patient has bilateral edema with brawny skin discoloration.  Venous reflux study does show saphenous vein reflux however he also has deep vein reflux, likely from his prior DVT.  This coupled with the fact that he is on hemodialysis, I do not think we will get adequate results with endovenous laser ablation.  Therefore I recommended continuing with leg elevation and compression socks.  He will follow-up on an as-needed basis.    Charlena Cross, MD, FACS [facility] [phone] Pager [phone]

## 2022-06-18 DIAGNOSIS — N186 End stage renal disease: Secondary | ICD-10-CM | POA: Diagnosis not present

## 2022-06-18 DIAGNOSIS — Z79899 Other long term (current) drug therapy: Secondary | ICD-10-CM | POA: Diagnosis not present

## 2022-06-18 DIAGNOSIS — D509 Iron deficiency anemia, unspecified: Secondary | ICD-10-CM | POA: Diagnosis not present

## 2022-06-18 DIAGNOSIS — E875 Hyperkalemia: Secondary | ICD-10-CM | POA: Diagnosis not present

## 2022-06-18 DIAGNOSIS — R17 Unspecified jaundice: Secondary | ICD-10-CM | POA: Diagnosis not present

## 2022-06-18 DIAGNOSIS — R82998 Other abnormal findings in urine: Secondary | ICD-10-CM | POA: Diagnosis not present

## 2022-06-18 DIAGNOSIS — E440 Moderate protein-calorie malnutrition: Secondary | ICD-10-CM | POA: Diagnosis not present

## 2022-06-18 DIAGNOSIS — N2581 Secondary hyperparathyroidism of renal origin: Secondary | ICD-10-CM | POA: Diagnosis not present

## 2022-06-18 DIAGNOSIS — D631 Anemia in chronic kidney disease: Secondary | ICD-10-CM | POA: Diagnosis not present

## 2022-06-18 DIAGNOSIS — Z992 Dependence on renal dialysis: Secondary | ICD-10-CM | POA: Diagnosis not present

## 2022-06-20 DIAGNOSIS — Z79899 Other long term (current) drug therapy: Secondary | ICD-10-CM | POA: Diagnosis not present

## 2022-06-20 DIAGNOSIS — D631 Anemia in chronic kidney disease: Secondary | ICD-10-CM | POA: Diagnosis not present

## 2022-06-20 DIAGNOSIS — R17 Unspecified jaundice: Secondary | ICD-10-CM | POA: Diagnosis not present

## 2022-06-20 DIAGNOSIS — N2581 Secondary hyperparathyroidism of renal origin: Secondary | ICD-10-CM | POA: Diagnosis not present

## 2022-06-20 DIAGNOSIS — R82998 Other abnormal findings in urine: Secondary | ICD-10-CM | POA: Diagnosis not present

## 2022-06-20 DIAGNOSIS — E440 Moderate protein-calorie malnutrition: Secondary | ICD-10-CM | POA: Diagnosis not present

## 2022-06-20 DIAGNOSIS — N186 End stage renal disease: Secondary | ICD-10-CM | POA: Diagnosis not present

## 2022-06-20 DIAGNOSIS — Z992 Dependence on renal dialysis: Secondary | ICD-10-CM | POA: Diagnosis not present

## 2022-06-20 DIAGNOSIS — E875 Hyperkalemia: Secondary | ICD-10-CM | POA: Diagnosis not present

## 2022-06-20 DIAGNOSIS — D509 Iron deficiency anemia, unspecified: Secondary | ICD-10-CM | POA: Diagnosis not present

## 2022-06-21 DIAGNOSIS — R17 Unspecified jaundice: Secondary | ICD-10-CM | POA: Diagnosis not present

## 2022-06-21 DIAGNOSIS — Z992 Dependence on renal dialysis: Secondary | ICD-10-CM | POA: Diagnosis not present

## 2022-06-21 DIAGNOSIS — R82998 Other abnormal findings in urine: Secondary | ICD-10-CM | POA: Diagnosis not present

## 2022-06-21 DIAGNOSIS — D631 Anemia in chronic kidney disease: Secondary | ICD-10-CM | POA: Diagnosis not present

## 2022-06-21 DIAGNOSIS — Z79899 Other long term (current) drug therapy: Secondary | ICD-10-CM | POA: Diagnosis not present

## 2022-06-21 DIAGNOSIS — D509 Iron deficiency anemia, unspecified: Secondary | ICD-10-CM | POA: Diagnosis not present

## 2022-06-21 DIAGNOSIS — N2581 Secondary hyperparathyroidism of renal origin: Secondary | ICD-10-CM | POA: Diagnosis not present

## 2022-06-21 DIAGNOSIS — E875 Hyperkalemia: Secondary | ICD-10-CM | POA: Diagnosis not present

## 2022-06-21 DIAGNOSIS — N186 End stage renal disease: Secondary | ICD-10-CM | POA: Diagnosis not present

## 2022-06-21 DIAGNOSIS — E440 Moderate protein-calorie malnutrition: Secondary | ICD-10-CM | POA: Diagnosis not present

## 2022-06-22 DIAGNOSIS — D631 Anemia in chronic kidney disease: Secondary | ICD-10-CM | POA: Diagnosis not present

## 2022-06-22 DIAGNOSIS — R82998 Other abnormal findings in urine: Secondary | ICD-10-CM | POA: Diagnosis not present

## 2022-06-22 DIAGNOSIS — Z79899 Other long term (current) drug therapy: Secondary | ICD-10-CM | POA: Diagnosis not present

## 2022-06-22 DIAGNOSIS — R17 Unspecified jaundice: Secondary | ICD-10-CM | POA: Diagnosis not present

## 2022-06-22 DIAGNOSIS — E875 Hyperkalemia: Secondary | ICD-10-CM | POA: Diagnosis not present

## 2022-06-22 DIAGNOSIS — E440 Moderate protein-calorie malnutrition: Secondary | ICD-10-CM | POA: Diagnosis not present

## 2022-06-22 DIAGNOSIS — Z992 Dependence on renal dialysis: Secondary | ICD-10-CM | POA: Diagnosis not present

## 2022-06-22 DIAGNOSIS — N2581 Secondary hyperparathyroidism of renal origin: Secondary | ICD-10-CM | POA: Diagnosis not present

## 2022-06-22 DIAGNOSIS — N186 End stage renal disease: Secondary | ICD-10-CM | POA: Diagnosis not present

## 2022-06-22 DIAGNOSIS — D509 Iron deficiency anemia, unspecified: Secondary | ICD-10-CM | POA: Diagnosis not present

## 2022-06-24 DIAGNOSIS — D509 Iron deficiency anemia, unspecified: Secondary | ICD-10-CM | POA: Diagnosis not present

## 2022-06-24 DIAGNOSIS — E875 Hyperkalemia: Secondary | ICD-10-CM | POA: Diagnosis not present

## 2022-06-24 DIAGNOSIS — Z79899 Other long term (current) drug therapy: Secondary | ICD-10-CM | POA: Diagnosis not present

## 2022-06-24 DIAGNOSIS — R17 Unspecified jaundice: Secondary | ICD-10-CM | POA: Diagnosis not present

## 2022-06-24 DIAGNOSIS — Z992 Dependence on renal dialysis: Secondary | ICD-10-CM | POA: Diagnosis not present

## 2022-06-24 DIAGNOSIS — E440 Moderate protein-calorie malnutrition: Secondary | ICD-10-CM | POA: Diagnosis not present

## 2022-06-24 DIAGNOSIS — R82998 Other abnormal findings in urine: Secondary | ICD-10-CM | POA: Diagnosis not present

## 2022-06-24 DIAGNOSIS — N186 End stage renal disease: Secondary | ICD-10-CM | POA: Diagnosis not present

## 2022-06-24 DIAGNOSIS — D631 Anemia in chronic kidney disease: Secondary | ICD-10-CM | POA: Diagnosis not present

## 2022-06-24 DIAGNOSIS — N2581 Secondary hyperparathyroidism of renal origin: Secondary | ICD-10-CM | POA: Diagnosis not present

## 2022-06-26 DIAGNOSIS — E440 Moderate protein-calorie malnutrition: Secondary | ICD-10-CM | POA: Diagnosis not present

## 2022-06-26 DIAGNOSIS — E875 Hyperkalemia: Secondary | ICD-10-CM | POA: Diagnosis not present

## 2022-06-26 DIAGNOSIS — D509 Iron deficiency anemia, unspecified: Secondary | ICD-10-CM | POA: Diagnosis not present

## 2022-06-26 DIAGNOSIS — R17 Unspecified jaundice: Secondary | ICD-10-CM | POA: Diagnosis not present

## 2022-06-26 DIAGNOSIS — N186 End stage renal disease: Secondary | ICD-10-CM | POA: Diagnosis not present

## 2022-06-26 DIAGNOSIS — R82998 Other abnormal findings in urine: Secondary | ICD-10-CM | POA: Diagnosis not present

## 2022-06-26 DIAGNOSIS — Z992 Dependence on renal dialysis: Secondary | ICD-10-CM | POA: Diagnosis not present

## 2022-06-26 DIAGNOSIS — N2581 Secondary hyperparathyroidism of renal origin: Secondary | ICD-10-CM | POA: Diagnosis not present

## 2022-06-26 DIAGNOSIS — D631 Anemia in chronic kidney disease: Secondary | ICD-10-CM | POA: Diagnosis not present

## 2022-06-26 DIAGNOSIS — Z79899 Other long term (current) drug therapy: Secondary | ICD-10-CM | POA: Diagnosis not present

## 2022-06-28 DIAGNOSIS — E875 Hyperkalemia: Secondary | ICD-10-CM | POA: Diagnosis not present

## 2022-06-28 DIAGNOSIS — N186 End stage renal disease: Secondary | ICD-10-CM | POA: Diagnosis not present

## 2022-06-28 DIAGNOSIS — Z79899 Other long term (current) drug therapy: Secondary | ICD-10-CM | POA: Diagnosis not present

## 2022-06-28 DIAGNOSIS — D631 Anemia in chronic kidney disease: Secondary | ICD-10-CM | POA: Diagnosis not present

## 2022-06-28 DIAGNOSIS — R17 Unspecified jaundice: Secondary | ICD-10-CM | POA: Diagnosis not present

## 2022-06-28 DIAGNOSIS — N2581 Secondary hyperparathyroidism of renal origin: Secondary | ICD-10-CM | POA: Diagnosis not present

## 2022-06-28 DIAGNOSIS — E440 Moderate protein-calorie malnutrition: Secondary | ICD-10-CM | POA: Diagnosis not present

## 2022-06-28 DIAGNOSIS — D509 Iron deficiency anemia, unspecified: Secondary | ICD-10-CM | POA: Diagnosis not present

## 2022-06-28 DIAGNOSIS — Z992 Dependence on renal dialysis: Secondary | ICD-10-CM | POA: Diagnosis not present

## 2022-06-28 DIAGNOSIS — R82998 Other abnormal findings in urine: Secondary | ICD-10-CM | POA: Diagnosis not present

## 2022-06-30 DIAGNOSIS — D631 Anemia in chronic kidney disease: Secondary | ICD-10-CM | POA: Diagnosis not present

## 2022-06-30 DIAGNOSIS — N186 End stage renal disease: Secondary | ICD-10-CM | POA: Diagnosis not present

## 2022-06-30 DIAGNOSIS — D509 Iron deficiency anemia, unspecified: Secondary | ICD-10-CM | POA: Diagnosis not present

## 2022-06-30 DIAGNOSIS — R17 Unspecified jaundice: Secondary | ICD-10-CM | POA: Diagnosis not present

## 2022-06-30 DIAGNOSIS — Z79899 Other long term (current) drug therapy: Secondary | ICD-10-CM | POA: Diagnosis not present

## 2022-06-30 DIAGNOSIS — Z992 Dependence on renal dialysis: Secondary | ICD-10-CM | POA: Diagnosis not present

## 2022-06-30 DIAGNOSIS — R82998 Other abnormal findings in urine: Secondary | ICD-10-CM | POA: Diagnosis not present

## 2022-06-30 DIAGNOSIS — E440 Moderate protein-calorie malnutrition: Secondary | ICD-10-CM | POA: Diagnosis not present

## 2022-06-30 DIAGNOSIS — N2581 Secondary hyperparathyroidism of renal origin: Secondary | ICD-10-CM | POA: Diagnosis not present

## 2022-06-30 DIAGNOSIS — E875 Hyperkalemia: Secondary | ICD-10-CM | POA: Diagnosis not present

## 2022-07-02 DIAGNOSIS — Z79899 Other long term (current) drug therapy: Secondary | ICD-10-CM | POA: Diagnosis not present

## 2022-07-02 DIAGNOSIS — E875 Hyperkalemia: Secondary | ICD-10-CM | POA: Diagnosis not present

## 2022-07-02 DIAGNOSIS — R17 Unspecified jaundice: Secondary | ICD-10-CM | POA: Diagnosis not present

## 2022-07-02 DIAGNOSIS — N186 End stage renal disease: Secondary | ICD-10-CM | POA: Diagnosis not present

## 2022-07-02 DIAGNOSIS — E440 Moderate protein-calorie malnutrition: Secondary | ICD-10-CM | POA: Diagnosis not present

## 2022-07-02 DIAGNOSIS — N2581 Secondary hyperparathyroidism of renal origin: Secondary | ICD-10-CM | POA: Diagnosis not present

## 2022-07-02 DIAGNOSIS — R82998 Other abnormal findings in urine: Secondary | ICD-10-CM | POA: Diagnosis not present

## 2022-07-02 DIAGNOSIS — D631 Anemia in chronic kidney disease: Secondary | ICD-10-CM | POA: Diagnosis not present

## 2022-07-02 DIAGNOSIS — D509 Iron deficiency anemia, unspecified: Secondary | ICD-10-CM | POA: Diagnosis not present

## 2022-07-02 DIAGNOSIS — Z992 Dependence on renal dialysis: Secondary | ICD-10-CM | POA: Diagnosis not present

## 2022-07-05 DIAGNOSIS — R82998 Other abnormal findings in urine: Secondary | ICD-10-CM | POA: Diagnosis not present

## 2022-07-05 DIAGNOSIS — E440 Moderate protein-calorie malnutrition: Secondary | ICD-10-CM | POA: Diagnosis not present

## 2022-07-05 DIAGNOSIS — N186 End stage renal disease: Secondary | ICD-10-CM | POA: Diagnosis not present

## 2022-07-05 DIAGNOSIS — E875 Hyperkalemia: Secondary | ICD-10-CM | POA: Diagnosis not present

## 2022-07-05 DIAGNOSIS — Z992 Dependence on renal dialysis: Secondary | ICD-10-CM | POA: Diagnosis not present

## 2022-07-05 DIAGNOSIS — D631 Anemia in chronic kidney disease: Secondary | ICD-10-CM | POA: Diagnosis not present

## 2022-07-05 DIAGNOSIS — Z79899 Other long term (current) drug therapy: Secondary | ICD-10-CM | POA: Diagnosis not present

## 2022-07-05 DIAGNOSIS — R17 Unspecified jaundice: Secondary | ICD-10-CM | POA: Diagnosis not present

## 2022-07-05 DIAGNOSIS — N2581 Secondary hyperparathyroidism of renal origin: Secondary | ICD-10-CM | POA: Diagnosis not present

## 2022-07-05 DIAGNOSIS — D509 Iron deficiency anemia, unspecified: Secondary | ICD-10-CM | POA: Diagnosis not present

## 2022-07-06 DIAGNOSIS — R17 Unspecified jaundice: Secondary | ICD-10-CM | POA: Diagnosis not present

## 2022-07-06 DIAGNOSIS — E440 Moderate protein-calorie malnutrition: Secondary | ICD-10-CM | POA: Diagnosis not present

## 2022-07-06 DIAGNOSIS — R82998 Other abnormal findings in urine: Secondary | ICD-10-CM | POA: Diagnosis not present

## 2022-07-06 DIAGNOSIS — N2581 Secondary hyperparathyroidism of renal origin: Secondary | ICD-10-CM | POA: Diagnosis not present

## 2022-07-06 DIAGNOSIS — D509 Iron deficiency anemia, unspecified: Secondary | ICD-10-CM | POA: Diagnosis not present

## 2022-07-06 DIAGNOSIS — D631 Anemia in chronic kidney disease: Secondary | ICD-10-CM | POA: Diagnosis not present

## 2022-07-06 DIAGNOSIS — N186 End stage renal disease: Secondary | ICD-10-CM | POA: Diagnosis not present

## 2022-07-06 DIAGNOSIS — E875 Hyperkalemia: Secondary | ICD-10-CM | POA: Diagnosis not present

## 2022-07-06 DIAGNOSIS — Z992 Dependence on renal dialysis: Secondary | ICD-10-CM | POA: Diagnosis not present

## 2022-07-06 DIAGNOSIS — Z79899 Other long term (current) drug therapy: Secondary | ICD-10-CM | POA: Diagnosis not present

## 2022-07-09 DIAGNOSIS — N186 End stage renal disease: Secondary | ICD-10-CM | POA: Diagnosis not present

## 2022-07-09 DIAGNOSIS — R82998 Other abnormal findings in urine: Secondary | ICD-10-CM | POA: Diagnosis not present

## 2022-07-09 DIAGNOSIS — D509 Iron deficiency anemia, unspecified: Secondary | ICD-10-CM | POA: Diagnosis not present

## 2022-07-09 DIAGNOSIS — E440 Moderate protein-calorie malnutrition: Secondary | ICD-10-CM | POA: Diagnosis not present

## 2022-07-09 DIAGNOSIS — Z79899 Other long term (current) drug therapy: Secondary | ICD-10-CM | POA: Diagnosis not present

## 2022-07-09 DIAGNOSIS — N2581 Secondary hyperparathyroidism of renal origin: Secondary | ICD-10-CM | POA: Diagnosis not present

## 2022-07-09 DIAGNOSIS — R17 Unspecified jaundice: Secondary | ICD-10-CM | POA: Diagnosis not present

## 2022-07-09 DIAGNOSIS — E875 Hyperkalemia: Secondary | ICD-10-CM | POA: Diagnosis not present

## 2022-07-09 DIAGNOSIS — Z992 Dependence on renal dialysis: Secondary | ICD-10-CM | POA: Diagnosis not present

## 2022-07-09 DIAGNOSIS — D631 Anemia in chronic kidney disease: Secondary | ICD-10-CM | POA: Diagnosis not present

## 2022-07-12 DIAGNOSIS — E440 Moderate protein-calorie malnutrition: Secondary | ICD-10-CM | POA: Diagnosis not present

## 2022-07-12 DIAGNOSIS — Z79899 Other long term (current) drug therapy: Secondary | ICD-10-CM | POA: Diagnosis not present

## 2022-07-12 DIAGNOSIS — D509 Iron deficiency anemia, unspecified: Secondary | ICD-10-CM | POA: Diagnosis not present

## 2022-07-12 DIAGNOSIS — R17 Unspecified jaundice: Secondary | ICD-10-CM | POA: Diagnosis not present

## 2022-07-12 DIAGNOSIS — E875 Hyperkalemia: Secondary | ICD-10-CM | POA: Diagnosis not present

## 2022-07-12 DIAGNOSIS — D631 Anemia in chronic kidney disease: Secondary | ICD-10-CM | POA: Diagnosis not present

## 2022-07-12 DIAGNOSIS — N186 End stage renal disease: Secondary | ICD-10-CM | POA: Diagnosis not present

## 2022-07-12 DIAGNOSIS — N2581 Secondary hyperparathyroidism of renal origin: Secondary | ICD-10-CM | POA: Diagnosis not present

## 2022-07-12 DIAGNOSIS — R82998 Other abnormal findings in urine: Secondary | ICD-10-CM | POA: Diagnosis not present

## 2022-07-12 DIAGNOSIS — Z992 Dependence on renal dialysis: Secondary | ICD-10-CM | POA: Diagnosis not present

## 2022-07-14 DIAGNOSIS — D631 Anemia in chronic kidney disease: Secondary | ICD-10-CM | POA: Diagnosis not present

## 2022-07-14 DIAGNOSIS — E875 Hyperkalemia: Secondary | ICD-10-CM | POA: Diagnosis not present

## 2022-07-14 DIAGNOSIS — Z79899 Other long term (current) drug therapy: Secondary | ICD-10-CM | POA: Diagnosis not present

## 2022-07-14 DIAGNOSIS — E440 Moderate protein-calorie malnutrition: Secondary | ICD-10-CM | POA: Diagnosis not present

## 2022-07-14 DIAGNOSIS — N2581 Secondary hyperparathyroidism of renal origin: Secondary | ICD-10-CM | POA: Diagnosis not present

## 2022-07-14 DIAGNOSIS — R17 Unspecified jaundice: Secondary | ICD-10-CM | POA: Diagnosis not present

## 2022-07-14 DIAGNOSIS — N186 End stage renal disease: Secondary | ICD-10-CM | POA: Diagnosis not present

## 2022-07-14 DIAGNOSIS — D509 Iron deficiency anemia, unspecified: Secondary | ICD-10-CM | POA: Diagnosis not present

## 2022-07-14 DIAGNOSIS — R82998 Other abnormal findings in urine: Secondary | ICD-10-CM | POA: Diagnosis not present

## 2022-07-14 DIAGNOSIS — Z992 Dependence on renal dialysis: Secondary | ICD-10-CM | POA: Diagnosis not present

## 2022-07-16 DIAGNOSIS — E875 Hyperkalemia: Secondary | ICD-10-CM | POA: Diagnosis not present

## 2022-07-16 DIAGNOSIS — Z992 Dependence on renal dialysis: Secondary | ICD-10-CM | POA: Diagnosis not present

## 2022-07-16 DIAGNOSIS — N2581 Secondary hyperparathyroidism of renal origin: Secondary | ICD-10-CM | POA: Diagnosis not present

## 2022-07-16 DIAGNOSIS — D509 Iron deficiency anemia, unspecified: Secondary | ICD-10-CM | POA: Diagnosis not present

## 2022-07-16 DIAGNOSIS — Z79899 Other long term (current) drug therapy: Secondary | ICD-10-CM | POA: Diagnosis not present

## 2022-07-16 DIAGNOSIS — E440 Moderate protein-calorie malnutrition: Secondary | ICD-10-CM | POA: Diagnosis not present

## 2022-07-16 DIAGNOSIS — D631 Anemia in chronic kidney disease: Secondary | ICD-10-CM | POA: Diagnosis not present

## 2022-07-16 DIAGNOSIS — R17 Unspecified jaundice: Secondary | ICD-10-CM | POA: Diagnosis not present

## 2022-07-16 DIAGNOSIS — N186 End stage renal disease: Secondary | ICD-10-CM | POA: Diagnosis not present

## 2022-07-16 DIAGNOSIS — R82998 Other abnormal findings in urine: Secondary | ICD-10-CM | POA: Diagnosis not present

## 2022-07-19 DIAGNOSIS — Z79899 Other long term (current) drug therapy: Secondary | ICD-10-CM | POA: Diagnosis not present

## 2022-07-19 DIAGNOSIS — N186 End stage renal disease: Secondary | ICD-10-CM | POA: Diagnosis not present

## 2022-07-19 DIAGNOSIS — E440 Moderate protein-calorie malnutrition: Secondary | ICD-10-CM | POA: Diagnosis not present

## 2022-07-19 DIAGNOSIS — R17 Unspecified jaundice: Secondary | ICD-10-CM | POA: Diagnosis not present

## 2022-07-19 DIAGNOSIS — D631 Anemia in chronic kidney disease: Secondary | ICD-10-CM | POA: Diagnosis not present

## 2022-07-19 DIAGNOSIS — N2581 Secondary hyperparathyroidism of renal origin: Secondary | ICD-10-CM | POA: Diagnosis not present

## 2022-07-19 DIAGNOSIS — E875 Hyperkalemia: Secondary | ICD-10-CM | POA: Diagnosis not present

## 2022-07-19 DIAGNOSIS — Z992 Dependence on renal dialysis: Secondary | ICD-10-CM | POA: Diagnosis not present

## 2022-07-19 DIAGNOSIS — R82998 Other abnormal findings in urine: Secondary | ICD-10-CM | POA: Diagnosis not present

## 2022-07-19 DIAGNOSIS — D509 Iron deficiency anemia, unspecified: Secondary | ICD-10-CM | POA: Diagnosis not present

## 2022-07-20 DIAGNOSIS — Z992 Dependence on renal dialysis: Secondary | ICD-10-CM | POA: Diagnosis not present

## 2022-07-20 DIAGNOSIS — D509 Iron deficiency anemia, unspecified: Secondary | ICD-10-CM | POA: Diagnosis not present

## 2022-07-20 DIAGNOSIS — E440 Moderate protein-calorie malnutrition: Secondary | ICD-10-CM | POA: Diagnosis not present

## 2022-07-20 DIAGNOSIS — N2581 Secondary hyperparathyroidism of renal origin: Secondary | ICD-10-CM | POA: Diagnosis not present

## 2022-07-20 DIAGNOSIS — N186 End stage renal disease: Secondary | ICD-10-CM | POA: Diagnosis not present

## 2022-07-20 DIAGNOSIS — Z79899 Other long term (current) drug therapy: Secondary | ICD-10-CM | POA: Diagnosis not present

## 2022-07-20 DIAGNOSIS — R82998 Other abnormal findings in urine: Secondary | ICD-10-CM | POA: Diagnosis not present

## 2022-07-20 DIAGNOSIS — D631 Anemia in chronic kidney disease: Secondary | ICD-10-CM | POA: Diagnosis not present

## 2022-07-20 DIAGNOSIS — R17 Unspecified jaundice: Secondary | ICD-10-CM | POA: Diagnosis not present

## 2022-07-20 DIAGNOSIS — E875 Hyperkalemia: Secondary | ICD-10-CM | POA: Diagnosis not present

## 2022-07-24 DIAGNOSIS — N2581 Secondary hyperparathyroidism of renal origin: Secondary | ICD-10-CM | POA: Diagnosis not present

## 2022-07-24 DIAGNOSIS — D509 Iron deficiency anemia, unspecified: Secondary | ICD-10-CM | POA: Diagnosis not present

## 2022-07-24 DIAGNOSIS — E875 Hyperkalemia: Secondary | ICD-10-CM | POA: Diagnosis not present

## 2022-07-24 DIAGNOSIS — Z79899 Other long term (current) drug therapy: Secondary | ICD-10-CM | POA: Diagnosis not present

## 2022-07-24 DIAGNOSIS — R17 Unspecified jaundice: Secondary | ICD-10-CM | POA: Diagnosis not present

## 2022-07-24 DIAGNOSIS — D631 Anemia in chronic kidney disease: Secondary | ICD-10-CM | POA: Diagnosis not present

## 2022-07-24 DIAGNOSIS — N186 End stage renal disease: Secondary | ICD-10-CM | POA: Diagnosis not present

## 2022-07-24 DIAGNOSIS — Z992 Dependence on renal dialysis: Secondary | ICD-10-CM | POA: Diagnosis not present

## 2022-07-24 DIAGNOSIS — R82998 Other abnormal findings in urine: Secondary | ICD-10-CM | POA: Diagnosis not present

## 2022-07-24 DIAGNOSIS — E440 Moderate protein-calorie malnutrition: Secondary | ICD-10-CM | POA: Diagnosis not present

## 2022-07-26 DIAGNOSIS — N2581 Secondary hyperparathyroidism of renal origin: Secondary | ICD-10-CM | POA: Diagnosis not present

## 2022-07-26 DIAGNOSIS — N186 End stage renal disease: Secondary | ICD-10-CM | POA: Diagnosis not present

## 2022-07-26 DIAGNOSIS — E440 Moderate protein-calorie malnutrition: Secondary | ICD-10-CM | POA: Diagnosis not present

## 2022-07-26 DIAGNOSIS — E875 Hyperkalemia: Secondary | ICD-10-CM | POA: Diagnosis not present

## 2022-07-26 DIAGNOSIS — D509 Iron deficiency anemia, unspecified: Secondary | ICD-10-CM | POA: Diagnosis not present

## 2022-07-26 DIAGNOSIS — R17 Unspecified jaundice: Secondary | ICD-10-CM | POA: Diagnosis not present

## 2022-07-26 DIAGNOSIS — D631 Anemia in chronic kidney disease: Secondary | ICD-10-CM | POA: Diagnosis not present

## 2022-07-26 DIAGNOSIS — Z79899 Other long term (current) drug therapy: Secondary | ICD-10-CM | POA: Diagnosis not present

## 2022-07-26 DIAGNOSIS — R82998 Other abnormal findings in urine: Secondary | ICD-10-CM | POA: Diagnosis not present

## 2022-07-26 DIAGNOSIS — Z992 Dependence on renal dialysis: Secondary | ICD-10-CM | POA: Diagnosis not present

## 2022-07-27 DIAGNOSIS — N186 End stage renal disease: Secondary | ICD-10-CM | POA: Diagnosis not present

## 2022-07-27 DIAGNOSIS — Z79899 Other long term (current) drug therapy: Secondary | ICD-10-CM | POA: Diagnosis not present

## 2022-07-27 DIAGNOSIS — Z992 Dependence on renal dialysis: Secondary | ICD-10-CM | POA: Diagnosis not present

## 2022-07-27 DIAGNOSIS — D509 Iron deficiency anemia, unspecified: Secondary | ICD-10-CM | POA: Diagnosis not present

## 2022-07-27 DIAGNOSIS — N2581 Secondary hyperparathyroidism of renal origin: Secondary | ICD-10-CM | POA: Diagnosis not present

## 2022-07-27 DIAGNOSIS — E875 Hyperkalemia: Secondary | ICD-10-CM | POA: Diagnosis not present

## 2022-07-27 DIAGNOSIS — R17 Unspecified jaundice: Secondary | ICD-10-CM | POA: Diagnosis not present

## 2022-07-27 DIAGNOSIS — E440 Moderate protein-calorie malnutrition: Secondary | ICD-10-CM | POA: Diagnosis not present

## 2022-07-27 DIAGNOSIS — R82998 Other abnormal findings in urine: Secondary | ICD-10-CM | POA: Diagnosis not present

## 2022-07-27 DIAGNOSIS — D631 Anemia in chronic kidney disease: Secondary | ICD-10-CM | POA: Diagnosis not present

## 2022-07-30 DIAGNOSIS — E875 Hyperkalemia: Secondary | ICD-10-CM | POA: Diagnosis not present

## 2022-07-30 DIAGNOSIS — N2581 Secondary hyperparathyroidism of renal origin: Secondary | ICD-10-CM | POA: Diagnosis not present

## 2022-07-30 DIAGNOSIS — H40023 Open angle with borderline findings, high risk, bilateral: Secondary | ICD-10-CM | POA: Diagnosis not present

## 2022-07-30 DIAGNOSIS — D509 Iron deficiency anemia, unspecified: Secondary | ICD-10-CM | POA: Diagnosis not present

## 2022-07-30 DIAGNOSIS — H5213 Myopia, bilateral: Secondary | ICD-10-CM | POA: Diagnosis not present

## 2022-07-30 DIAGNOSIS — R17 Unspecified jaundice: Secondary | ICD-10-CM | POA: Diagnosis not present

## 2022-07-30 DIAGNOSIS — H524 Presbyopia: Secondary | ICD-10-CM | POA: Diagnosis not present

## 2022-07-30 DIAGNOSIS — R82998 Other abnormal findings in urine: Secondary | ICD-10-CM | POA: Diagnosis not present

## 2022-07-30 DIAGNOSIS — E440 Moderate protein-calorie malnutrition: Secondary | ICD-10-CM | POA: Diagnosis not present

## 2022-07-30 DIAGNOSIS — Z992 Dependence on renal dialysis: Secondary | ICD-10-CM | POA: Diagnosis not present

## 2022-07-30 DIAGNOSIS — Z79899 Other long term (current) drug therapy: Secondary | ICD-10-CM | POA: Diagnosis not present

## 2022-07-30 DIAGNOSIS — I10 Essential (primary) hypertension: Secondary | ICD-10-CM | POA: Diagnosis not present

## 2022-07-30 DIAGNOSIS — H52223 Regular astigmatism, bilateral: Secondary | ICD-10-CM | POA: Diagnosis not present

## 2022-07-30 DIAGNOSIS — N186 End stage renal disease: Secondary | ICD-10-CM | POA: Diagnosis not present

## 2022-07-30 DIAGNOSIS — D631 Anemia in chronic kidney disease: Secondary | ICD-10-CM | POA: Diagnosis not present

## 2022-08-01 DIAGNOSIS — R82998 Other abnormal findings in urine: Secondary | ICD-10-CM | POA: Diagnosis not present

## 2022-08-01 DIAGNOSIS — D631 Anemia in chronic kidney disease: Secondary | ICD-10-CM | POA: Diagnosis not present

## 2022-08-01 DIAGNOSIS — N186 End stage renal disease: Secondary | ICD-10-CM | POA: Diagnosis not present

## 2022-08-01 DIAGNOSIS — E875 Hyperkalemia: Secondary | ICD-10-CM | POA: Diagnosis not present

## 2022-08-01 DIAGNOSIS — R17 Unspecified jaundice: Secondary | ICD-10-CM | POA: Diagnosis not present

## 2022-08-01 DIAGNOSIS — Z992 Dependence on renal dialysis: Secondary | ICD-10-CM | POA: Diagnosis not present

## 2022-08-01 DIAGNOSIS — N2581 Secondary hyperparathyroidism of renal origin: Secondary | ICD-10-CM | POA: Diagnosis not present

## 2022-08-01 DIAGNOSIS — E440 Moderate protein-calorie malnutrition: Secondary | ICD-10-CM | POA: Diagnosis not present

## 2022-08-01 DIAGNOSIS — D509 Iron deficiency anemia, unspecified: Secondary | ICD-10-CM | POA: Diagnosis not present

## 2022-08-01 DIAGNOSIS — Z79899 Other long term (current) drug therapy: Secondary | ICD-10-CM | POA: Diagnosis not present

## 2022-08-01 DIAGNOSIS — N049 Nephrotic syndrome with unspecified morphologic changes: Secondary | ICD-10-CM | POA: Diagnosis not present

## 2022-08-03 DIAGNOSIS — R82998 Other abnormal findings in urine: Secondary | ICD-10-CM | POA: Diagnosis not present

## 2022-08-03 DIAGNOSIS — E875 Hyperkalemia: Secondary | ICD-10-CM | POA: Diagnosis not present

## 2022-08-03 DIAGNOSIS — D509 Iron deficiency anemia, unspecified: Secondary | ICD-10-CM | POA: Diagnosis not present

## 2022-08-03 DIAGNOSIS — N2581 Secondary hyperparathyroidism of renal origin: Secondary | ICD-10-CM | POA: Diagnosis not present

## 2022-08-03 DIAGNOSIS — N186 End stage renal disease: Secondary | ICD-10-CM | POA: Diagnosis not present

## 2022-08-03 DIAGNOSIS — E440 Moderate protein-calorie malnutrition: Secondary | ICD-10-CM | POA: Diagnosis not present

## 2022-08-03 DIAGNOSIS — N2589 Other disorders resulting from impaired renal tubular function: Secondary | ICD-10-CM | POA: Diagnosis not present

## 2022-08-03 DIAGNOSIS — K769 Liver disease, unspecified: Secondary | ICD-10-CM | POA: Diagnosis not present

## 2022-08-03 DIAGNOSIS — Z992 Dependence on renal dialysis: Secondary | ICD-10-CM | POA: Diagnosis not present

## 2022-08-03 DIAGNOSIS — D631 Anemia in chronic kidney disease: Secondary | ICD-10-CM | POA: Diagnosis not present

## 2022-08-07 DIAGNOSIS — K769 Liver disease, unspecified: Secondary | ICD-10-CM | POA: Diagnosis not present

## 2022-08-07 DIAGNOSIS — D631 Anemia in chronic kidney disease: Secondary | ICD-10-CM | POA: Diagnosis not present

## 2022-08-07 DIAGNOSIS — R82998 Other abnormal findings in urine: Secondary | ICD-10-CM | POA: Diagnosis not present

## 2022-08-07 DIAGNOSIS — N2581 Secondary hyperparathyroidism of renal origin: Secondary | ICD-10-CM | POA: Diagnosis not present

## 2022-08-07 DIAGNOSIS — Z992 Dependence on renal dialysis: Secondary | ICD-10-CM | POA: Diagnosis not present

## 2022-08-07 DIAGNOSIS — D509 Iron deficiency anemia, unspecified: Secondary | ICD-10-CM | POA: Diagnosis not present

## 2022-08-07 DIAGNOSIS — N186 End stage renal disease: Secondary | ICD-10-CM | POA: Diagnosis not present

## 2022-08-07 DIAGNOSIS — E440 Moderate protein-calorie malnutrition: Secondary | ICD-10-CM | POA: Diagnosis not present

## 2022-08-07 DIAGNOSIS — E875 Hyperkalemia: Secondary | ICD-10-CM | POA: Diagnosis not present

## 2022-08-07 DIAGNOSIS — N2589 Other disorders resulting from impaired renal tubular function: Secondary | ICD-10-CM | POA: Diagnosis not present

## 2022-08-09 DIAGNOSIS — N2589 Other disorders resulting from impaired renal tubular function: Secondary | ICD-10-CM | POA: Diagnosis not present

## 2022-08-09 DIAGNOSIS — E875 Hyperkalemia: Secondary | ICD-10-CM | POA: Diagnosis not present

## 2022-08-09 DIAGNOSIS — Z992 Dependence on renal dialysis: Secondary | ICD-10-CM | POA: Diagnosis not present

## 2022-08-09 DIAGNOSIS — K769 Liver disease, unspecified: Secondary | ICD-10-CM | POA: Diagnosis not present

## 2022-08-09 DIAGNOSIS — E440 Moderate protein-calorie malnutrition: Secondary | ICD-10-CM | POA: Diagnosis not present

## 2022-08-09 DIAGNOSIS — N186 End stage renal disease: Secondary | ICD-10-CM | POA: Diagnosis not present

## 2022-08-09 DIAGNOSIS — R82998 Other abnormal findings in urine: Secondary | ICD-10-CM | POA: Diagnosis not present

## 2022-08-09 DIAGNOSIS — D631 Anemia in chronic kidney disease: Secondary | ICD-10-CM | POA: Diagnosis not present

## 2022-08-09 DIAGNOSIS — D509 Iron deficiency anemia, unspecified: Secondary | ICD-10-CM | POA: Diagnosis not present

## 2022-08-09 DIAGNOSIS — N2581 Secondary hyperparathyroidism of renal origin: Secondary | ICD-10-CM | POA: Diagnosis not present

## 2022-08-12 DIAGNOSIS — D509 Iron deficiency anemia, unspecified: Secondary | ICD-10-CM | POA: Diagnosis not present

## 2022-08-12 DIAGNOSIS — N2581 Secondary hyperparathyroidism of renal origin: Secondary | ICD-10-CM | POA: Diagnosis not present

## 2022-08-12 DIAGNOSIS — K769 Liver disease, unspecified: Secondary | ICD-10-CM | POA: Diagnosis not present

## 2022-08-12 DIAGNOSIS — E875 Hyperkalemia: Secondary | ICD-10-CM | POA: Diagnosis not present

## 2022-08-12 DIAGNOSIS — D631 Anemia in chronic kidney disease: Secondary | ICD-10-CM | POA: Diagnosis not present

## 2022-08-12 DIAGNOSIS — E440 Moderate protein-calorie malnutrition: Secondary | ICD-10-CM | POA: Diagnosis not present

## 2022-08-12 DIAGNOSIS — N2589 Other disorders resulting from impaired renal tubular function: Secondary | ICD-10-CM | POA: Diagnosis not present

## 2022-08-12 DIAGNOSIS — R82998 Other abnormal findings in urine: Secondary | ICD-10-CM | POA: Diagnosis not present

## 2022-08-12 DIAGNOSIS — N186 End stage renal disease: Secondary | ICD-10-CM | POA: Diagnosis not present

## 2022-08-12 DIAGNOSIS — Z992 Dependence on renal dialysis: Secondary | ICD-10-CM | POA: Diagnosis not present

## 2022-08-14 DIAGNOSIS — N2581 Secondary hyperparathyroidism of renal origin: Secondary | ICD-10-CM | POA: Diagnosis not present

## 2022-08-14 DIAGNOSIS — E440 Moderate protein-calorie malnutrition: Secondary | ICD-10-CM | POA: Diagnosis not present

## 2022-08-14 DIAGNOSIS — D509 Iron deficiency anemia, unspecified: Secondary | ICD-10-CM | POA: Diagnosis not present

## 2022-08-14 DIAGNOSIS — R82998 Other abnormal findings in urine: Secondary | ICD-10-CM | POA: Diagnosis not present

## 2022-08-14 DIAGNOSIS — Z992 Dependence on renal dialysis: Secondary | ICD-10-CM | POA: Diagnosis not present

## 2022-08-14 DIAGNOSIS — N2589 Other disorders resulting from impaired renal tubular function: Secondary | ICD-10-CM | POA: Diagnosis not present

## 2022-08-14 DIAGNOSIS — E875 Hyperkalemia: Secondary | ICD-10-CM | POA: Diagnosis not present

## 2022-08-14 DIAGNOSIS — D631 Anemia in chronic kidney disease: Secondary | ICD-10-CM | POA: Diagnosis not present

## 2022-08-14 DIAGNOSIS — N186 End stage renal disease: Secondary | ICD-10-CM | POA: Diagnosis not present

## 2022-08-14 DIAGNOSIS — K769 Liver disease, unspecified: Secondary | ICD-10-CM | POA: Diagnosis not present

## 2022-08-16 DIAGNOSIS — N2589 Other disorders resulting from impaired renal tubular function: Secondary | ICD-10-CM | POA: Diagnosis not present

## 2022-08-16 DIAGNOSIS — E440 Moderate protein-calorie malnutrition: Secondary | ICD-10-CM | POA: Diagnosis not present

## 2022-08-16 DIAGNOSIS — D509 Iron deficiency anemia, unspecified: Secondary | ICD-10-CM | POA: Diagnosis not present

## 2022-08-16 DIAGNOSIS — K769 Liver disease, unspecified: Secondary | ICD-10-CM | POA: Diagnosis not present

## 2022-08-16 DIAGNOSIS — Z992 Dependence on renal dialysis: Secondary | ICD-10-CM | POA: Diagnosis not present

## 2022-08-16 DIAGNOSIS — N2581 Secondary hyperparathyroidism of renal origin: Secondary | ICD-10-CM | POA: Diagnosis not present

## 2022-08-16 DIAGNOSIS — D631 Anemia in chronic kidney disease: Secondary | ICD-10-CM | POA: Diagnosis not present

## 2022-08-16 DIAGNOSIS — E875 Hyperkalemia: Secondary | ICD-10-CM | POA: Diagnosis not present

## 2022-08-16 DIAGNOSIS — R82998 Other abnormal findings in urine: Secondary | ICD-10-CM | POA: Diagnosis not present

## 2022-08-16 DIAGNOSIS — N186 End stage renal disease: Secondary | ICD-10-CM | POA: Diagnosis not present

## 2022-08-17 DIAGNOSIS — R82998 Other abnormal findings in urine: Secondary | ICD-10-CM | POA: Diagnosis not present

## 2022-08-17 DIAGNOSIS — D631 Anemia in chronic kidney disease: Secondary | ICD-10-CM | POA: Diagnosis not present

## 2022-08-17 DIAGNOSIS — E875 Hyperkalemia: Secondary | ICD-10-CM | POA: Diagnosis not present

## 2022-08-17 DIAGNOSIS — N2581 Secondary hyperparathyroidism of renal origin: Secondary | ICD-10-CM | POA: Diagnosis not present

## 2022-08-17 DIAGNOSIS — D509 Iron deficiency anemia, unspecified: Secondary | ICD-10-CM | POA: Diagnosis not present

## 2022-08-17 DIAGNOSIS — N2589 Other disorders resulting from impaired renal tubular function: Secondary | ICD-10-CM | POA: Diagnosis not present

## 2022-08-17 DIAGNOSIS — N186 End stage renal disease: Secondary | ICD-10-CM | POA: Diagnosis not present

## 2022-08-17 DIAGNOSIS — K769 Liver disease, unspecified: Secondary | ICD-10-CM | POA: Diagnosis not present

## 2022-08-17 DIAGNOSIS — Z992 Dependence on renal dialysis: Secondary | ICD-10-CM | POA: Diagnosis not present

## 2022-08-17 DIAGNOSIS — E440 Moderate protein-calorie malnutrition: Secondary | ICD-10-CM | POA: Diagnosis not present

## 2022-08-20 DIAGNOSIS — Z992 Dependence on renal dialysis: Secondary | ICD-10-CM | POA: Diagnosis not present

## 2022-08-20 DIAGNOSIS — E875 Hyperkalemia: Secondary | ICD-10-CM | POA: Diagnosis not present

## 2022-08-20 DIAGNOSIS — N2581 Secondary hyperparathyroidism of renal origin: Secondary | ICD-10-CM | POA: Diagnosis not present

## 2022-08-20 DIAGNOSIS — K769 Liver disease, unspecified: Secondary | ICD-10-CM | POA: Diagnosis not present

## 2022-08-20 DIAGNOSIS — R82998 Other abnormal findings in urine: Secondary | ICD-10-CM | POA: Diagnosis not present

## 2022-08-20 DIAGNOSIS — N2589 Other disorders resulting from impaired renal tubular function: Secondary | ICD-10-CM | POA: Diagnosis not present

## 2022-08-20 DIAGNOSIS — E440 Moderate protein-calorie malnutrition: Secondary | ICD-10-CM | POA: Diagnosis not present

## 2022-08-20 DIAGNOSIS — D509 Iron deficiency anemia, unspecified: Secondary | ICD-10-CM | POA: Diagnosis not present

## 2022-08-20 DIAGNOSIS — N186 End stage renal disease: Secondary | ICD-10-CM | POA: Diagnosis not present

## 2022-08-20 DIAGNOSIS — D631 Anemia in chronic kidney disease: Secondary | ICD-10-CM | POA: Diagnosis not present

## 2022-08-21 DIAGNOSIS — D509 Iron deficiency anemia, unspecified: Secondary | ICD-10-CM | POA: Diagnosis not present

## 2022-08-21 DIAGNOSIS — Z992 Dependence on renal dialysis: Secondary | ICD-10-CM | POA: Diagnosis not present

## 2022-08-21 DIAGNOSIS — N2581 Secondary hyperparathyroidism of renal origin: Secondary | ICD-10-CM | POA: Diagnosis not present

## 2022-08-21 DIAGNOSIS — N2589 Other disorders resulting from impaired renal tubular function: Secondary | ICD-10-CM | POA: Diagnosis not present

## 2022-08-21 DIAGNOSIS — E875 Hyperkalemia: Secondary | ICD-10-CM | POA: Diagnosis not present

## 2022-08-21 DIAGNOSIS — D631 Anemia in chronic kidney disease: Secondary | ICD-10-CM | POA: Diagnosis not present

## 2022-08-21 DIAGNOSIS — R82998 Other abnormal findings in urine: Secondary | ICD-10-CM | POA: Diagnosis not present

## 2022-08-21 DIAGNOSIS — K769 Liver disease, unspecified: Secondary | ICD-10-CM | POA: Diagnosis not present

## 2022-08-21 DIAGNOSIS — N186 End stage renal disease: Secondary | ICD-10-CM | POA: Diagnosis not present

## 2022-08-21 DIAGNOSIS — E440 Moderate protein-calorie malnutrition: Secondary | ICD-10-CM | POA: Diagnosis not present

## 2022-08-23 DIAGNOSIS — D631 Anemia in chronic kidney disease: Secondary | ICD-10-CM | POA: Diagnosis not present

## 2022-08-23 DIAGNOSIS — R82998 Other abnormal findings in urine: Secondary | ICD-10-CM | POA: Diagnosis not present

## 2022-08-23 DIAGNOSIS — N186 End stage renal disease: Secondary | ICD-10-CM | POA: Diagnosis not present

## 2022-08-23 DIAGNOSIS — E440 Moderate protein-calorie malnutrition: Secondary | ICD-10-CM | POA: Diagnosis not present

## 2022-08-23 DIAGNOSIS — N2589 Other disorders resulting from impaired renal tubular function: Secondary | ICD-10-CM | POA: Diagnosis not present

## 2022-08-23 DIAGNOSIS — Z992 Dependence on renal dialysis: Secondary | ICD-10-CM | POA: Diagnosis not present

## 2022-08-23 DIAGNOSIS — D509 Iron deficiency anemia, unspecified: Secondary | ICD-10-CM | POA: Diagnosis not present

## 2022-08-23 DIAGNOSIS — E875 Hyperkalemia: Secondary | ICD-10-CM | POA: Diagnosis not present

## 2022-08-23 DIAGNOSIS — N2581 Secondary hyperparathyroidism of renal origin: Secondary | ICD-10-CM | POA: Diagnosis not present

## 2022-08-23 DIAGNOSIS — K769 Liver disease, unspecified: Secondary | ICD-10-CM | POA: Diagnosis not present

## 2022-08-24 DIAGNOSIS — Z992 Dependence on renal dialysis: Secondary | ICD-10-CM | POA: Diagnosis not present

## 2022-08-24 DIAGNOSIS — N2581 Secondary hyperparathyroidism of renal origin: Secondary | ICD-10-CM | POA: Diagnosis not present

## 2022-08-24 DIAGNOSIS — K769 Liver disease, unspecified: Secondary | ICD-10-CM | POA: Diagnosis not present

## 2022-08-24 DIAGNOSIS — D631 Anemia in chronic kidney disease: Secondary | ICD-10-CM | POA: Diagnosis not present

## 2022-08-24 DIAGNOSIS — E875 Hyperkalemia: Secondary | ICD-10-CM | POA: Diagnosis not present

## 2022-08-24 DIAGNOSIS — E440 Moderate protein-calorie malnutrition: Secondary | ICD-10-CM | POA: Diagnosis not present

## 2022-08-24 DIAGNOSIS — R82998 Other abnormal findings in urine: Secondary | ICD-10-CM | POA: Diagnosis not present

## 2022-08-24 DIAGNOSIS — N2589 Other disorders resulting from impaired renal tubular function: Secondary | ICD-10-CM | POA: Diagnosis not present

## 2022-08-24 DIAGNOSIS — D509 Iron deficiency anemia, unspecified: Secondary | ICD-10-CM | POA: Diagnosis not present

## 2022-08-24 DIAGNOSIS — N186 End stage renal disease: Secondary | ICD-10-CM | POA: Diagnosis not present

## 2022-08-25 DIAGNOSIS — E440 Moderate protein-calorie malnutrition: Secondary | ICD-10-CM | POA: Diagnosis not present

## 2022-08-25 DIAGNOSIS — K769 Liver disease, unspecified: Secondary | ICD-10-CM | POA: Diagnosis not present

## 2022-08-25 DIAGNOSIS — D509 Iron deficiency anemia, unspecified: Secondary | ICD-10-CM | POA: Diagnosis not present

## 2022-08-25 DIAGNOSIS — E875 Hyperkalemia: Secondary | ICD-10-CM | POA: Diagnosis not present

## 2022-08-25 DIAGNOSIS — D631 Anemia in chronic kidney disease: Secondary | ICD-10-CM | POA: Diagnosis not present

## 2022-08-25 DIAGNOSIS — N2589 Other disorders resulting from impaired renal tubular function: Secondary | ICD-10-CM | POA: Diagnosis not present

## 2022-08-25 DIAGNOSIS — Z992 Dependence on renal dialysis: Secondary | ICD-10-CM | POA: Diagnosis not present

## 2022-08-25 DIAGNOSIS — N186 End stage renal disease: Secondary | ICD-10-CM | POA: Diagnosis not present

## 2022-08-25 DIAGNOSIS — R82998 Other abnormal findings in urine: Secondary | ICD-10-CM | POA: Diagnosis not present

## 2022-08-25 DIAGNOSIS — N2581 Secondary hyperparathyroidism of renal origin: Secondary | ICD-10-CM | POA: Diagnosis not present

## 2022-08-27 DIAGNOSIS — N2589 Other disorders resulting from impaired renal tubular function: Secondary | ICD-10-CM | POA: Diagnosis not present

## 2022-08-27 DIAGNOSIS — R82998 Other abnormal findings in urine: Secondary | ICD-10-CM | POA: Diagnosis not present

## 2022-08-27 DIAGNOSIS — E875 Hyperkalemia: Secondary | ICD-10-CM | POA: Diagnosis not present

## 2022-08-27 DIAGNOSIS — Z992 Dependence on renal dialysis: Secondary | ICD-10-CM | POA: Diagnosis not present

## 2022-08-27 DIAGNOSIS — D631 Anemia in chronic kidney disease: Secondary | ICD-10-CM | POA: Diagnosis not present

## 2022-08-27 DIAGNOSIS — D509 Iron deficiency anemia, unspecified: Secondary | ICD-10-CM | POA: Diagnosis not present

## 2022-08-27 DIAGNOSIS — K769 Liver disease, unspecified: Secondary | ICD-10-CM | POA: Diagnosis not present

## 2022-08-27 DIAGNOSIS — E440 Moderate protein-calorie malnutrition: Secondary | ICD-10-CM | POA: Diagnosis not present

## 2022-08-27 DIAGNOSIS — N2581 Secondary hyperparathyroidism of renal origin: Secondary | ICD-10-CM | POA: Diagnosis not present

## 2022-08-27 DIAGNOSIS — N186 End stage renal disease: Secondary | ICD-10-CM | POA: Diagnosis not present

## 2022-08-28 DIAGNOSIS — K769 Liver disease, unspecified: Secondary | ICD-10-CM | POA: Diagnosis not present

## 2022-08-28 DIAGNOSIS — N2589 Other disorders resulting from impaired renal tubular function: Secondary | ICD-10-CM | POA: Diagnosis not present

## 2022-08-28 DIAGNOSIS — E875 Hyperkalemia: Secondary | ICD-10-CM | POA: Diagnosis not present

## 2022-08-28 DIAGNOSIS — N2581 Secondary hyperparathyroidism of renal origin: Secondary | ICD-10-CM | POA: Diagnosis not present

## 2022-08-28 DIAGNOSIS — E440 Moderate protein-calorie malnutrition: Secondary | ICD-10-CM | POA: Diagnosis not present

## 2022-08-28 DIAGNOSIS — N186 End stage renal disease: Secondary | ICD-10-CM | POA: Diagnosis not present

## 2022-08-28 DIAGNOSIS — D631 Anemia in chronic kidney disease: Secondary | ICD-10-CM | POA: Diagnosis not present

## 2022-08-28 DIAGNOSIS — Z992 Dependence on renal dialysis: Secondary | ICD-10-CM | POA: Diagnosis not present

## 2022-08-28 DIAGNOSIS — R82998 Other abnormal findings in urine: Secondary | ICD-10-CM | POA: Diagnosis not present

## 2022-08-28 DIAGNOSIS — D509 Iron deficiency anemia, unspecified: Secondary | ICD-10-CM | POA: Diagnosis not present

## 2022-08-30 DIAGNOSIS — D631 Anemia in chronic kidney disease: Secondary | ICD-10-CM | POA: Diagnosis not present

## 2022-08-30 DIAGNOSIS — R82998 Other abnormal findings in urine: Secondary | ICD-10-CM | POA: Diagnosis not present

## 2022-08-30 DIAGNOSIS — Z992 Dependence on renal dialysis: Secondary | ICD-10-CM | POA: Diagnosis not present

## 2022-08-30 DIAGNOSIS — E440 Moderate protein-calorie malnutrition: Secondary | ICD-10-CM | POA: Diagnosis not present

## 2022-08-30 DIAGNOSIS — E875 Hyperkalemia: Secondary | ICD-10-CM | POA: Diagnosis not present

## 2022-08-30 DIAGNOSIS — N186 End stage renal disease: Secondary | ICD-10-CM | POA: Diagnosis not present

## 2022-08-30 DIAGNOSIS — N2589 Other disorders resulting from impaired renal tubular function: Secondary | ICD-10-CM | POA: Diagnosis not present

## 2022-08-30 DIAGNOSIS — D509 Iron deficiency anemia, unspecified: Secondary | ICD-10-CM | POA: Diagnosis not present

## 2022-08-30 DIAGNOSIS — N2581 Secondary hyperparathyroidism of renal origin: Secondary | ICD-10-CM | POA: Diagnosis not present

## 2022-08-30 DIAGNOSIS — K769 Liver disease, unspecified: Secondary | ICD-10-CM | POA: Diagnosis not present

## 2022-08-31 DIAGNOSIS — Z992 Dependence on renal dialysis: Secondary | ICD-10-CM | POA: Diagnosis not present

## 2022-08-31 DIAGNOSIS — N2581 Secondary hyperparathyroidism of renal origin: Secondary | ICD-10-CM | POA: Diagnosis not present

## 2022-08-31 DIAGNOSIS — D631 Anemia in chronic kidney disease: Secondary | ICD-10-CM | POA: Diagnosis not present

## 2022-08-31 DIAGNOSIS — R82998 Other abnormal findings in urine: Secondary | ICD-10-CM | POA: Diagnosis not present

## 2022-08-31 DIAGNOSIS — K769 Liver disease, unspecified: Secondary | ICD-10-CM | POA: Diagnosis not present

## 2022-08-31 DIAGNOSIS — N049 Nephrotic syndrome with unspecified morphologic changes: Secondary | ICD-10-CM | POA: Diagnosis not present

## 2022-08-31 DIAGNOSIS — D509 Iron deficiency anemia, unspecified: Secondary | ICD-10-CM | POA: Diagnosis not present

## 2022-08-31 DIAGNOSIS — N2589 Other disorders resulting from impaired renal tubular function: Secondary | ICD-10-CM | POA: Diagnosis not present

## 2022-08-31 DIAGNOSIS — E440 Moderate protein-calorie malnutrition: Secondary | ICD-10-CM | POA: Diagnosis not present

## 2022-08-31 DIAGNOSIS — N186 End stage renal disease: Secondary | ICD-10-CM | POA: Diagnosis not present

## 2022-08-31 DIAGNOSIS — E875 Hyperkalemia: Secondary | ICD-10-CM | POA: Diagnosis not present

## 2022-09-03 DIAGNOSIS — Z992 Dependence on renal dialysis: Secondary | ICD-10-CM | POA: Diagnosis not present

## 2022-09-03 DIAGNOSIS — N186 End stage renal disease: Secondary | ICD-10-CM | POA: Diagnosis not present

## 2022-09-03 DIAGNOSIS — E875 Hyperkalemia: Secondary | ICD-10-CM | POA: Diagnosis not present

## 2022-09-03 DIAGNOSIS — D631 Anemia in chronic kidney disease: Secondary | ICD-10-CM | POA: Diagnosis not present

## 2022-09-03 DIAGNOSIS — Z79899 Other long term (current) drug therapy: Secondary | ICD-10-CM | POA: Diagnosis not present

## 2022-09-03 DIAGNOSIS — D509 Iron deficiency anemia, unspecified: Secondary | ICD-10-CM | POA: Diagnosis not present

## 2022-09-03 DIAGNOSIS — E440 Moderate protein-calorie malnutrition: Secondary | ICD-10-CM | POA: Diagnosis not present

## 2022-09-03 DIAGNOSIS — N2581 Secondary hyperparathyroidism of renal origin: Secondary | ICD-10-CM | POA: Diagnosis not present

## 2022-09-03 DIAGNOSIS — R82998 Other abnormal findings in urine: Secondary | ICD-10-CM | POA: Diagnosis not present

## 2022-09-03 DIAGNOSIS — R17 Unspecified jaundice: Secondary | ICD-10-CM | POA: Diagnosis not present

## 2022-09-06 DIAGNOSIS — E440 Moderate protein-calorie malnutrition: Secondary | ICD-10-CM | POA: Diagnosis not present

## 2022-09-06 DIAGNOSIS — D509 Iron deficiency anemia, unspecified: Secondary | ICD-10-CM | POA: Diagnosis not present

## 2022-09-06 DIAGNOSIS — Z79899 Other long term (current) drug therapy: Secondary | ICD-10-CM | POA: Diagnosis not present

## 2022-09-06 DIAGNOSIS — D631 Anemia in chronic kidney disease: Secondary | ICD-10-CM | POA: Diagnosis not present

## 2022-09-06 DIAGNOSIS — Z992 Dependence on renal dialysis: Secondary | ICD-10-CM | POA: Diagnosis not present

## 2022-09-06 DIAGNOSIS — N2581 Secondary hyperparathyroidism of renal origin: Secondary | ICD-10-CM | POA: Diagnosis not present

## 2022-09-06 DIAGNOSIS — N186 End stage renal disease: Secondary | ICD-10-CM | POA: Diagnosis not present

## 2022-09-06 DIAGNOSIS — E875 Hyperkalemia: Secondary | ICD-10-CM | POA: Diagnosis not present

## 2022-09-06 DIAGNOSIS — R82998 Other abnormal findings in urine: Secondary | ICD-10-CM | POA: Diagnosis not present

## 2022-09-06 DIAGNOSIS — R17 Unspecified jaundice: Secondary | ICD-10-CM | POA: Diagnosis not present

## 2022-09-07 DIAGNOSIS — N186 End stage renal disease: Secondary | ICD-10-CM | POA: Diagnosis not present

## 2022-09-07 DIAGNOSIS — Z992 Dependence on renal dialysis: Secondary | ICD-10-CM | POA: Diagnosis not present

## 2022-09-07 DIAGNOSIS — Z79899 Other long term (current) drug therapy: Secondary | ICD-10-CM | POA: Diagnosis not present

## 2022-09-07 DIAGNOSIS — D631 Anemia in chronic kidney disease: Secondary | ICD-10-CM | POA: Diagnosis not present

## 2022-09-07 DIAGNOSIS — E440 Moderate protein-calorie malnutrition: Secondary | ICD-10-CM | POA: Diagnosis not present

## 2022-09-07 DIAGNOSIS — R82998 Other abnormal findings in urine: Secondary | ICD-10-CM | POA: Diagnosis not present

## 2022-09-07 DIAGNOSIS — E875 Hyperkalemia: Secondary | ICD-10-CM | POA: Diagnosis not present

## 2022-09-07 DIAGNOSIS — N2581 Secondary hyperparathyroidism of renal origin: Secondary | ICD-10-CM | POA: Diagnosis not present

## 2022-09-07 DIAGNOSIS — R17 Unspecified jaundice: Secondary | ICD-10-CM | POA: Diagnosis not present

## 2022-09-07 DIAGNOSIS — D509 Iron deficiency anemia, unspecified: Secondary | ICD-10-CM | POA: Diagnosis not present

## 2022-09-09 DIAGNOSIS — E440 Moderate protein-calorie malnutrition: Secondary | ICD-10-CM | POA: Diagnosis not present

## 2022-09-09 DIAGNOSIS — N186 End stage renal disease: Secondary | ICD-10-CM | POA: Diagnosis not present

## 2022-09-09 DIAGNOSIS — D631 Anemia in chronic kidney disease: Secondary | ICD-10-CM | POA: Diagnosis not present

## 2022-09-09 DIAGNOSIS — D509 Iron deficiency anemia, unspecified: Secondary | ICD-10-CM | POA: Diagnosis not present

## 2022-09-09 DIAGNOSIS — R82998 Other abnormal findings in urine: Secondary | ICD-10-CM | POA: Diagnosis not present

## 2022-09-09 DIAGNOSIS — E875 Hyperkalemia: Secondary | ICD-10-CM | POA: Diagnosis not present

## 2022-09-09 DIAGNOSIS — Z79899 Other long term (current) drug therapy: Secondary | ICD-10-CM | POA: Diagnosis not present

## 2022-09-09 DIAGNOSIS — N2581 Secondary hyperparathyroidism of renal origin: Secondary | ICD-10-CM | POA: Diagnosis not present

## 2022-09-09 DIAGNOSIS — Z992 Dependence on renal dialysis: Secondary | ICD-10-CM | POA: Diagnosis not present

## 2022-09-09 DIAGNOSIS — R17 Unspecified jaundice: Secondary | ICD-10-CM | POA: Diagnosis not present

## 2022-09-10 DIAGNOSIS — N2581 Secondary hyperparathyroidism of renal origin: Secondary | ICD-10-CM | POA: Diagnosis not present

## 2022-09-10 DIAGNOSIS — Z992 Dependence on renal dialysis: Secondary | ICD-10-CM | POA: Diagnosis not present

## 2022-09-10 DIAGNOSIS — D509 Iron deficiency anemia, unspecified: Secondary | ICD-10-CM | POA: Diagnosis not present

## 2022-09-10 DIAGNOSIS — Z79899 Other long term (current) drug therapy: Secondary | ICD-10-CM | POA: Diagnosis not present

## 2022-09-10 DIAGNOSIS — R17 Unspecified jaundice: Secondary | ICD-10-CM | POA: Diagnosis not present

## 2022-09-10 DIAGNOSIS — N186 End stage renal disease: Secondary | ICD-10-CM | POA: Diagnosis not present

## 2022-09-10 DIAGNOSIS — R82998 Other abnormal findings in urine: Secondary | ICD-10-CM | POA: Diagnosis not present

## 2022-09-10 DIAGNOSIS — D631 Anemia in chronic kidney disease: Secondary | ICD-10-CM | POA: Diagnosis not present

## 2022-09-10 DIAGNOSIS — E440 Moderate protein-calorie malnutrition: Secondary | ICD-10-CM | POA: Diagnosis not present

## 2022-09-10 DIAGNOSIS — E875 Hyperkalemia: Secondary | ICD-10-CM | POA: Diagnosis not present

## 2022-09-11 DIAGNOSIS — Z79899 Other long term (current) drug therapy: Secondary | ICD-10-CM | POA: Diagnosis not present

## 2022-09-11 DIAGNOSIS — D509 Iron deficiency anemia, unspecified: Secondary | ICD-10-CM | POA: Diagnosis not present

## 2022-09-11 DIAGNOSIS — R17 Unspecified jaundice: Secondary | ICD-10-CM | POA: Diagnosis not present

## 2022-09-11 DIAGNOSIS — N186 End stage renal disease: Secondary | ICD-10-CM | POA: Diagnosis not present

## 2022-09-11 DIAGNOSIS — D631 Anemia in chronic kidney disease: Secondary | ICD-10-CM | POA: Diagnosis not present

## 2022-09-11 DIAGNOSIS — Z992 Dependence on renal dialysis: Secondary | ICD-10-CM | POA: Diagnosis not present

## 2022-09-11 DIAGNOSIS — E875 Hyperkalemia: Secondary | ICD-10-CM | POA: Diagnosis not present

## 2022-09-11 DIAGNOSIS — E440 Moderate protein-calorie malnutrition: Secondary | ICD-10-CM | POA: Diagnosis not present

## 2022-09-11 DIAGNOSIS — N2581 Secondary hyperparathyroidism of renal origin: Secondary | ICD-10-CM | POA: Diagnosis not present

## 2022-09-11 DIAGNOSIS — R82998 Other abnormal findings in urine: Secondary | ICD-10-CM | POA: Diagnosis not present

## 2022-09-13 DIAGNOSIS — D509 Iron deficiency anemia, unspecified: Secondary | ICD-10-CM | POA: Diagnosis not present

## 2022-09-13 DIAGNOSIS — E875 Hyperkalemia: Secondary | ICD-10-CM | POA: Diagnosis not present

## 2022-09-13 DIAGNOSIS — N2581 Secondary hyperparathyroidism of renal origin: Secondary | ICD-10-CM | POA: Diagnosis not present

## 2022-09-13 DIAGNOSIS — Z992 Dependence on renal dialysis: Secondary | ICD-10-CM | POA: Diagnosis not present

## 2022-09-13 DIAGNOSIS — N186 End stage renal disease: Secondary | ICD-10-CM | POA: Diagnosis not present

## 2022-09-13 DIAGNOSIS — Z79899 Other long term (current) drug therapy: Secondary | ICD-10-CM | POA: Diagnosis not present

## 2022-09-13 DIAGNOSIS — R17 Unspecified jaundice: Secondary | ICD-10-CM | POA: Diagnosis not present

## 2022-09-13 DIAGNOSIS — R82998 Other abnormal findings in urine: Secondary | ICD-10-CM | POA: Diagnosis not present

## 2022-09-13 DIAGNOSIS — D631 Anemia in chronic kidney disease: Secondary | ICD-10-CM | POA: Diagnosis not present

## 2022-09-13 DIAGNOSIS — E440 Moderate protein-calorie malnutrition: Secondary | ICD-10-CM | POA: Diagnosis not present

## 2022-09-14 DIAGNOSIS — N186 End stage renal disease: Secondary | ICD-10-CM | POA: Diagnosis not present

## 2022-09-14 DIAGNOSIS — Z992 Dependence on renal dialysis: Secondary | ICD-10-CM | POA: Diagnosis not present

## 2022-09-14 DIAGNOSIS — D631 Anemia in chronic kidney disease: Secondary | ICD-10-CM | POA: Diagnosis not present

## 2022-09-14 DIAGNOSIS — R82998 Other abnormal findings in urine: Secondary | ICD-10-CM | POA: Diagnosis not present

## 2022-09-14 DIAGNOSIS — D509 Iron deficiency anemia, unspecified: Secondary | ICD-10-CM | POA: Diagnosis not present

## 2022-09-14 DIAGNOSIS — E440 Moderate protein-calorie malnutrition: Secondary | ICD-10-CM | POA: Diagnosis not present

## 2022-09-14 DIAGNOSIS — Z79899 Other long term (current) drug therapy: Secondary | ICD-10-CM | POA: Diagnosis not present

## 2022-09-14 DIAGNOSIS — R17 Unspecified jaundice: Secondary | ICD-10-CM | POA: Diagnosis not present

## 2022-09-14 DIAGNOSIS — N2581 Secondary hyperparathyroidism of renal origin: Secondary | ICD-10-CM | POA: Diagnosis not present

## 2022-09-14 DIAGNOSIS — E875 Hyperkalemia: Secondary | ICD-10-CM | POA: Diagnosis not present

## 2022-09-17 DIAGNOSIS — Z992 Dependence on renal dialysis: Secondary | ICD-10-CM | POA: Diagnosis not present

## 2022-09-17 DIAGNOSIS — R82998 Other abnormal findings in urine: Secondary | ICD-10-CM | POA: Diagnosis not present

## 2022-09-17 DIAGNOSIS — N186 End stage renal disease: Secondary | ICD-10-CM | POA: Diagnosis not present

## 2022-09-17 DIAGNOSIS — R17 Unspecified jaundice: Secondary | ICD-10-CM | POA: Diagnosis not present

## 2022-09-17 DIAGNOSIS — E440 Moderate protein-calorie malnutrition: Secondary | ICD-10-CM | POA: Diagnosis not present

## 2022-09-17 DIAGNOSIS — D509 Iron deficiency anemia, unspecified: Secondary | ICD-10-CM | POA: Diagnosis not present

## 2022-09-17 DIAGNOSIS — Z79899 Other long term (current) drug therapy: Secondary | ICD-10-CM | POA: Diagnosis not present

## 2022-09-17 DIAGNOSIS — D631 Anemia in chronic kidney disease: Secondary | ICD-10-CM | POA: Diagnosis not present

## 2022-09-17 DIAGNOSIS — E875 Hyperkalemia: Secondary | ICD-10-CM | POA: Diagnosis not present

## 2022-09-17 DIAGNOSIS — N2581 Secondary hyperparathyroidism of renal origin: Secondary | ICD-10-CM | POA: Diagnosis not present

## 2022-09-19 DIAGNOSIS — E875 Hyperkalemia: Secondary | ICD-10-CM | POA: Diagnosis not present

## 2022-09-19 DIAGNOSIS — N186 End stage renal disease: Secondary | ICD-10-CM | POA: Diagnosis not present

## 2022-09-19 DIAGNOSIS — D631 Anemia in chronic kidney disease: Secondary | ICD-10-CM | POA: Diagnosis not present

## 2022-09-19 DIAGNOSIS — D509 Iron deficiency anemia, unspecified: Secondary | ICD-10-CM | POA: Diagnosis not present

## 2022-09-19 DIAGNOSIS — N2581 Secondary hyperparathyroidism of renal origin: Secondary | ICD-10-CM | POA: Diagnosis not present

## 2022-09-19 DIAGNOSIS — R17 Unspecified jaundice: Secondary | ICD-10-CM | POA: Diagnosis not present

## 2022-09-19 DIAGNOSIS — E440 Moderate protein-calorie malnutrition: Secondary | ICD-10-CM | POA: Diagnosis not present

## 2022-09-19 DIAGNOSIS — Z992 Dependence on renal dialysis: Secondary | ICD-10-CM | POA: Diagnosis not present

## 2022-09-19 DIAGNOSIS — Z79899 Other long term (current) drug therapy: Secondary | ICD-10-CM | POA: Diagnosis not present

## 2022-09-19 DIAGNOSIS — R82998 Other abnormal findings in urine: Secondary | ICD-10-CM | POA: Diagnosis not present

## 2022-09-21 DIAGNOSIS — E875 Hyperkalemia: Secondary | ICD-10-CM | POA: Diagnosis not present

## 2022-09-21 DIAGNOSIS — E440 Moderate protein-calorie malnutrition: Secondary | ICD-10-CM | POA: Diagnosis not present

## 2022-09-21 DIAGNOSIS — D509 Iron deficiency anemia, unspecified: Secondary | ICD-10-CM | POA: Diagnosis not present

## 2022-09-21 DIAGNOSIS — D631 Anemia in chronic kidney disease: Secondary | ICD-10-CM | POA: Diagnosis not present

## 2022-09-21 DIAGNOSIS — Z992 Dependence on renal dialysis: Secondary | ICD-10-CM | POA: Diagnosis not present

## 2022-09-21 DIAGNOSIS — R17 Unspecified jaundice: Secondary | ICD-10-CM | POA: Diagnosis not present

## 2022-09-21 DIAGNOSIS — N186 End stage renal disease: Secondary | ICD-10-CM | POA: Diagnosis not present

## 2022-09-21 DIAGNOSIS — R82998 Other abnormal findings in urine: Secondary | ICD-10-CM | POA: Diagnosis not present

## 2022-09-21 DIAGNOSIS — N2581 Secondary hyperparathyroidism of renal origin: Secondary | ICD-10-CM | POA: Diagnosis not present

## 2022-09-21 DIAGNOSIS — Z79899 Other long term (current) drug therapy: Secondary | ICD-10-CM | POA: Diagnosis not present

## 2022-09-24 DIAGNOSIS — D509 Iron deficiency anemia, unspecified: Secondary | ICD-10-CM | POA: Diagnosis not present

## 2022-09-24 DIAGNOSIS — Z79899 Other long term (current) drug therapy: Secondary | ICD-10-CM | POA: Diagnosis not present

## 2022-09-24 DIAGNOSIS — N186 End stage renal disease: Secondary | ICD-10-CM | POA: Diagnosis not present

## 2022-09-24 DIAGNOSIS — E440 Moderate protein-calorie malnutrition: Secondary | ICD-10-CM | POA: Diagnosis not present

## 2022-09-24 DIAGNOSIS — D631 Anemia in chronic kidney disease: Secondary | ICD-10-CM | POA: Diagnosis not present

## 2022-09-24 DIAGNOSIS — R17 Unspecified jaundice: Secondary | ICD-10-CM | POA: Diagnosis not present

## 2022-09-24 DIAGNOSIS — Z992 Dependence on renal dialysis: Secondary | ICD-10-CM | POA: Diagnosis not present

## 2022-09-24 DIAGNOSIS — N2581 Secondary hyperparathyroidism of renal origin: Secondary | ICD-10-CM | POA: Diagnosis not present

## 2022-09-24 DIAGNOSIS — R82998 Other abnormal findings in urine: Secondary | ICD-10-CM | POA: Diagnosis not present

## 2022-09-24 DIAGNOSIS — E875 Hyperkalemia: Secondary | ICD-10-CM | POA: Diagnosis not present

## 2022-09-26 DIAGNOSIS — R17 Unspecified jaundice: Secondary | ICD-10-CM | POA: Diagnosis not present

## 2022-09-26 DIAGNOSIS — N2581 Secondary hyperparathyroidism of renal origin: Secondary | ICD-10-CM | POA: Diagnosis not present

## 2022-09-26 DIAGNOSIS — N186 End stage renal disease: Secondary | ICD-10-CM | POA: Diagnosis not present

## 2022-09-26 DIAGNOSIS — E440 Moderate protein-calorie malnutrition: Secondary | ICD-10-CM | POA: Diagnosis not present

## 2022-09-26 DIAGNOSIS — E875 Hyperkalemia: Secondary | ICD-10-CM | POA: Diagnosis not present

## 2022-09-26 DIAGNOSIS — Z79899 Other long term (current) drug therapy: Secondary | ICD-10-CM | POA: Diagnosis not present

## 2022-09-26 DIAGNOSIS — R82998 Other abnormal findings in urine: Secondary | ICD-10-CM | POA: Diagnosis not present

## 2022-09-26 DIAGNOSIS — D631 Anemia in chronic kidney disease: Secondary | ICD-10-CM | POA: Diagnosis not present

## 2022-09-26 DIAGNOSIS — D509 Iron deficiency anemia, unspecified: Secondary | ICD-10-CM | POA: Diagnosis not present

## 2022-09-26 DIAGNOSIS — Z992 Dependence on renal dialysis: Secondary | ICD-10-CM | POA: Diagnosis not present

## 2022-09-27 DIAGNOSIS — Z79899 Other long term (current) drug therapy: Secondary | ICD-10-CM | POA: Diagnosis not present

## 2022-09-27 DIAGNOSIS — R82998 Other abnormal findings in urine: Secondary | ICD-10-CM | POA: Diagnosis not present

## 2022-09-27 DIAGNOSIS — N2581 Secondary hyperparathyroidism of renal origin: Secondary | ICD-10-CM | POA: Diagnosis not present

## 2022-09-27 DIAGNOSIS — E440 Moderate protein-calorie malnutrition: Secondary | ICD-10-CM | POA: Diagnosis not present

## 2022-09-27 DIAGNOSIS — R17 Unspecified jaundice: Secondary | ICD-10-CM | POA: Diagnosis not present

## 2022-09-27 DIAGNOSIS — D631 Anemia in chronic kidney disease: Secondary | ICD-10-CM | POA: Diagnosis not present

## 2022-09-27 DIAGNOSIS — D509 Iron deficiency anemia, unspecified: Secondary | ICD-10-CM | POA: Diagnosis not present

## 2022-09-27 DIAGNOSIS — N186 End stage renal disease: Secondary | ICD-10-CM | POA: Diagnosis not present

## 2022-09-27 DIAGNOSIS — Z992 Dependence on renal dialysis: Secondary | ICD-10-CM | POA: Diagnosis not present

## 2022-09-27 DIAGNOSIS — E875 Hyperkalemia: Secondary | ICD-10-CM | POA: Diagnosis not present

## 2022-09-30 DIAGNOSIS — N186 End stage renal disease: Secondary | ICD-10-CM | POA: Diagnosis not present

## 2022-09-30 DIAGNOSIS — D509 Iron deficiency anemia, unspecified: Secondary | ICD-10-CM | POA: Diagnosis not present

## 2022-09-30 DIAGNOSIS — Z992 Dependence on renal dialysis: Secondary | ICD-10-CM | POA: Diagnosis not present

## 2022-09-30 DIAGNOSIS — R17 Unspecified jaundice: Secondary | ICD-10-CM | POA: Diagnosis not present

## 2022-09-30 DIAGNOSIS — Z79899 Other long term (current) drug therapy: Secondary | ICD-10-CM | POA: Diagnosis not present

## 2022-09-30 DIAGNOSIS — E875 Hyperkalemia: Secondary | ICD-10-CM | POA: Diagnosis not present

## 2022-09-30 DIAGNOSIS — E440 Moderate protein-calorie malnutrition: Secondary | ICD-10-CM | POA: Diagnosis not present

## 2022-09-30 DIAGNOSIS — R82998 Other abnormal findings in urine: Secondary | ICD-10-CM | POA: Diagnosis not present

## 2022-09-30 DIAGNOSIS — N2581 Secondary hyperparathyroidism of renal origin: Secondary | ICD-10-CM | POA: Diagnosis not present

## 2022-09-30 DIAGNOSIS — D631 Anemia in chronic kidney disease: Secondary | ICD-10-CM | POA: Diagnosis not present

## 2022-10-01 DIAGNOSIS — N186 End stage renal disease: Secondary | ICD-10-CM | POA: Diagnosis not present

## 2022-10-01 DIAGNOSIS — N049 Nephrotic syndrome with unspecified morphologic changes: Secondary | ICD-10-CM | POA: Diagnosis not present

## 2022-10-01 DIAGNOSIS — Z992 Dependence on renal dialysis: Secondary | ICD-10-CM | POA: Diagnosis not present

## 2022-10-02 DIAGNOSIS — N2581 Secondary hyperparathyroidism of renal origin: Secondary | ICD-10-CM | POA: Diagnosis not present

## 2022-10-02 DIAGNOSIS — Z992 Dependence on renal dialysis: Secondary | ICD-10-CM | POA: Diagnosis not present

## 2022-10-02 DIAGNOSIS — D631 Anemia in chronic kidney disease: Secondary | ICD-10-CM | POA: Diagnosis not present

## 2022-10-02 DIAGNOSIS — D509 Iron deficiency anemia, unspecified: Secondary | ICD-10-CM | POA: Diagnosis not present

## 2022-10-02 DIAGNOSIS — R17 Unspecified jaundice: Secondary | ICD-10-CM | POA: Diagnosis not present

## 2022-10-02 DIAGNOSIS — E875 Hyperkalemia: Secondary | ICD-10-CM | POA: Diagnosis not present

## 2022-10-02 DIAGNOSIS — Z79899 Other long term (current) drug therapy: Secondary | ICD-10-CM | POA: Diagnosis not present

## 2022-10-02 DIAGNOSIS — R82998 Other abnormal findings in urine: Secondary | ICD-10-CM | POA: Diagnosis not present

## 2022-10-02 DIAGNOSIS — N186 End stage renal disease: Secondary | ICD-10-CM | POA: Diagnosis not present

## 2022-10-02 DIAGNOSIS — E440 Moderate protein-calorie malnutrition: Secondary | ICD-10-CM | POA: Diagnosis not present

## 2022-10-03 DIAGNOSIS — R82998 Other abnormal findings in urine: Secondary | ICD-10-CM | POA: Diagnosis not present

## 2022-10-03 DIAGNOSIS — E875 Hyperkalemia: Secondary | ICD-10-CM | POA: Diagnosis not present

## 2022-10-03 DIAGNOSIS — D509 Iron deficiency anemia, unspecified: Secondary | ICD-10-CM | POA: Diagnosis not present

## 2022-10-03 DIAGNOSIS — N186 End stage renal disease: Secondary | ICD-10-CM | POA: Diagnosis not present

## 2022-10-03 DIAGNOSIS — Z79899 Other long term (current) drug therapy: Secondary | ICD-10-CM | POA: Diagnosis not present

## 2022-10-03 DIAGNOSIS — E440 Moderate protein-calorie malnutrition: Secondary | ICD-10-CM | POA: Diagnosis not present

## 2022-10-03 DIAGNOSIS — Z992 Dependence on renal dialysis: Secondary | ICD-10-CM | POA: Diagnosis not present

## 2022-10-03 DIAGNOSIS — D631 Anemia in chronic kidney disease: Secondary | ICD-10-CM | POA: Diagnosis not present

## 2022-10-03 DIAGNOSIS — R17 Unspecified jaundice: Secondary | ICD-10-CM | POA: Diagnosis not present

## 2022-10-03 DIAGNOSIS — N2581 Secondary hyperparathyroidism of renal origin: Secondary | ICD-10-CM | POA: Diagnosis not present

## 2022-10-05 DIAGNOSIS — D509 Iron deficiency anemia, unspecified: Secondary | ICD-10-CM | POA: Diagnosis not present

## 2022-10-05 DIAGNOSIS — Z79899 Other long term (current) drug therapy: Secondary | ICD-10-CM | POA: Diagnosis not present

## 2022-10-05 DIAGNOSIS — Z992 Dependence on renal dialysis: Secondary | ICD-10-CM | POA: Diagnosis not present

## 2022-10-05 DIAGNOSIS — E875 Hyperkalemia: Secondary | ICD-10-CM | POA: Diagnosis not present

## 2022-10-05 DIAGNOSIS — E440 Moderate protein-calorie malnutrition: Secondary | ICD-10-CM | POA: Diagnosis not present

## 2022-10-05 DIAGNOSIS — N2581 Secondary hyperparathyroidism of renal origin: Secondary | ICD-10-CM | POA: Diagnosis not present

## 2022-10-05 DIAGNOSIS — D631 Anemia in chronic kidney disease: Secondary | ICD-10-CM | POA: Diagnosis not present

## 2022-10-05 DIAGNOSIS — N186 End stage renal disease: Secondary | ICD-10-CM | POA: Diagnosis not present

## 2022-10-05 DIAGNOSIS — R82998 Other abnormal findings in urine: Secondary | ICD-10-CM | POA: Diagnosis not present

## 2022-10-05 DIAGNOSIS — R17 Unspecified jaundice: Secondary | ICD-10-CM | POA: Diagnosis not present

## 2022-10-08 DIAGNOSIS — N186 End stage renal disease: Secondary | ICD-10-CM | POA: Diagnosis not present

## 2022-10-08 DIAGNOSIS — D631 Anemia in chronic kidney disease: Secondary | ICD-10-CM | POA: Diagnosis not present

## 2022-10-08 DIAGNOSIS — D509 Iron deficiency anemia, unspecified: Secondary | ICD-10-CM | POA: Diagnosis not present

## 2022-10-08 DIAGNOSIS — E875 Hyperkalemia: Secondary | ICD-10-CM | POA: Diagnosis not present

## 2022-10-08 DIAGNOSIS — N2581 Secondary hyperparathyroidism of renal origin: Secondary | ICD-10-CM | POA: Diagnosis not present

## 2022-10-08 DIAGNOSIS — E440 Moderate protein-calorie malnutrition: Secondary | ICD-10-CM | POA: Diagnosis not present

## 2022-10-08 DIAGNOSIS — R82998 Other abnormal findings in urine: Secondary | ICD-10-CM | POA: Diagnosis not present

## 2022-10-08 DIAGNOSIS — Z992 Dependence on renal dialysis: Secondary | ICD-10-CM | POA: Diagnosis not present

## 2022-10-08 DIAGNOSIS — R17 Unspecified jaundice: Secondary | ICD-10-CM | POA: Diagnosis not present

## 2022-10-08 DIAGNOSIS — Z79899 Other long term (current) drug therapy: Secondary | ICD-10-CM | POA: Diagnosis not present

## 2022-10-10 DIAGNOSIS — D631 Anemia in chronic kidney disease: Secondary | ICD-10-CM | POA: Diagnosis not present

## 2022-10-10 DIAGNOSIS — Z79899 Other long term (current) drug therapy: Secondary | ICD-10-CM | POA: Diagnosis not present

## 2022-10-10 DIAGNOSIS — R82998 Other abnormal findings in urine: Secondary | ICD-10-CM | POA: Diagnosis not present

## 2022-10-10 DIAGNOSIS — R17 Unspecified jaundice: Secondary | ICD-10-CM | POA: Diagnosis not present

## 2022-10-10 DIAGNOSIS — N186 End stage renal disease: Secondary | ICD-10-CM | POA: Diagnosis not present

## 2022-10-10 DIAGNOSIS — N2581 Secondary hyperparathyroidism of renal origin: Secondary | ICD-10-CM | POA: Diagnosis not present

## 2022-10-10 DIAGNOSIS — E875 Hyperkalemia: Secondary | ICD-10-CM | POA: Diagnosis not present

## 2022-10-10 DIAGNOSIS — D509 Iron deficiency anemia, unspecified: Secondary | ICD-10-CM | POA: Diagnosis not present

## 2022-10-10 DIAGNOSIS — Z992 Dependence on renal dialysis: Secondary | ICD-10-CM | POA: Diagnosis not present

## 2022-10-10 DIAGNOSIS — E440 Moderate protein-calorie malnutrition: Secondary | ICD-10-CM | POA: Diagnosis not present

## 2022-10-11 DIAGNOSIS — E875 Hyperkalemia: Secondary | ICD-10-CM | POA: Diagnosis not present

## 2022-10-11 DIAGNOSIS — N186 End stage renal disease: Secondary | ICD-10-CM | POA: Diagnosis not present

## 2022-10-11 DIAGNOSIS — D631 Anemia in chronic kidney disease: Secondary | ICD-10-CM | POA: Diagnosis not present

## 2022-10-11 DIAGNOSIS — Z992 Dependence on renal dialysis: Secondary | ICD-10-CM | POA: Diagnosis not present

## 2022-10-11 DIAGNOSIS — R82998 Other abnormal findings in urine: Secondary | ICD-10-CM | POA: Diagnosis not present

## 2022-10-11 DIAGNOSIS — R17 Unspecified jaundice: Secondary | ICD-10-CM | POA: Diagnosis not present

## 2022-10-11 DIAGNOSIS — N2581 Secondary hyperparathyroidism of renal origin: Secondary | ICD-10-CM | POA: Diagnosis not present

## 2022-10-11 DIAGNOSIS — Z79899 Other long term (current) drug therapy: Secondary | ICD-10-CM | POA: Diagnosis not present

## 2022-10-11 DIAGNOSIS — E440 Moderate protein-calorie malnutrition: Secondary | ICD-10-CM | POA: Diagnosis not present

## 2022-10-11 DIAGNOSIS — D509 Iron deficiency anemia, unspecified: Secondary | ICD-10-CM | POA: Diagnosis not present

## 2022-10-12 DIAGNOSIS — E875 Hyperkalemia: Secondary | ICD-10-CM | POA: Diagnosis not present

## 2022-10-12 DIAGNOSIS — Z79899 Other long term (current) drug therapy: Secondary | ICD-10-CM | POA: Diagnosis not present

## 2022-10-12 DIAGNOSIS — E440 Moderate protein-calorie malnutrition: Secondary | ICD-10-CM | POA: Diagnosis not present

## 2022-10-12 DIAGNOSIS — N2581 Secondary hyperparathyroidism of renal origin: Secondary | ICD-10-CM | POA: Diagnosis not present

## 2022-10-12 DIAGNOSIS — D509 Iron deficiency anemia, unspecified: Secondary | ICD-10-CM | POA: Diagnosis not present

## 2022-10-12 DIAGNOSIS — N186 End stage renal disease: Secondary | ICD-10-CM | POA: Diagnosis not present

## 2022-10-12 DIAGNOSIS — D631 Anemia in chronic kidney disease: Secondary | ICD-10-CM | POA: Diagnosis not present

## 2022-10-12 DIAGNOSIS — Z992 Dependence on renal dialysis: Secondary | ICD-10-CM | POA: Diagnosis not present

## 2022-10-12 DIAGNOSIS — R82998 Other abnormal findings in urine: Secondary | ICD-10-CM | POA: Diagnosis not present

## 2022-10-12 DIAGNOSIS — R17 Unspecified jaundice: Secondary | ICD-10-CM | POA: Diagnosis not present

## 2022-10-16 DIAGNOSIS — Z992 Dependence on renal dialysis: Secondary | ICD-10-CM | POA: Diagnosis not present

## 2022-10-16 DIAGNOSIS — E440 Moderate protein-calorie malnutrition: Secondary | ICD-10-CM | POA: Diagnosis not present

## 2022-10-16 DIAGNOSIS — Z79899 Other long term (current) drug therapy: Secondary | ICD-10-CM | POA: Diagnosis not present

## 2022-10-16 DIAGNOSIS — D509 Iron deficiency anemia, unspecified: Secondary | ICD-10-CM | POA: Diagnosis not present

## 2022-10-16 DIAGNOSIS — N186 End stage renal disease: Secondary | ICD-10-CM | POA: Diagnosis not present

## 2022-10-16 DIAGNOSIS — E875 Hyperkalemia: Secondary | ICD-10-CM | POA: Diagnosis not present

## 2022-10-16 DIAGNOSIS — R17 Unspecified jaundice: Secondary | ICD-10-CM | POA: Diagnosis not present

## 2022-10-16 DIAGNOSIS — N2581 Secondary hyperparathyroidism of renal origin: Secondary | ICD-10-CM | POA: Diagnosis not present

## 2022-10-16 DIAGNOSIS — D631 Anemia in chronic kidney disease: Secondary | ICD-10-CM | POA: Diagnosis not present

## 2022-10-16 DIAGNOSIS — R82998 Other abnormal findings in urine: Secondary | ICD-10-CM | POA: Diagnosis not present

## 2022-10-17 DIAGNOSIS — N186 End stage renal disease: Secondary | ICD-10-CM | POA: Diagnosis not present

## 2022-10-17 DIAGNOSIS — Z992 Dependence on renal dialysis: Secondary | ICD-10-CM | POA: Diagnosis not present

## 2022-10-17 DIAGNOSIS — E440 Moderate protein-calorie malnutrition: Secondary | ICD-10-CM | POA: Diagnosis not present

## 2022-10-17 DIAGNOSIS — D509 Iron deficiency anemia, unspecified: Secondary | ICD-10-CM | POA: Diagnosis not present

## 2022-10-17 DIAGNOSIS — E875 Hyperkalemia: Secondary | ICD-10-CM | POA: Diagnosis not present

## 2022-10-17 DIAGNOSIS — R82998 Other abnormal findings in urine: Secondary | ICD-10-CM | POA: Diagnosis not present

## 2022-10-17 DIAGNOSIS — R17 Unspecified jaundice: Secondary | ICD-10-CM | POA: Diagnosis not present

## 2022-10-17 DIAGNOSIS — N2581 Secondary hyperparathyroidism of renal origin: Secondary | ICD-10-CM | POA: Diagnosis not present

## 2022-10-17 DIAGNOSIS — Z79899 Other long term (current) drug therapy: Secondary | ICD-10-CM | POA: Diagnosis not present

## 2022-10-17 DIAGNOSIS — D631 Anemia in chronic kidney disease: Secondary | ICD-10-CM | POA: Diagnosis not present

## 2022-10-18 DIAGNOSIS — R82998 Other abnormal findings in urine: Secondary | ICD-10-CM | POA: Diagnosis not present

## 2022-10-18 DIAGNOSIS — Z79899 Other long term (current) drug therapy: Secondary | ICD-10-CM | POA: Diagnosis not present

## 2022-10-18 DIAGNOSIS — N2581 Secondary hyperparathyroidism of renal origin: Secondary | ICD-10-CM | POA: Diagnosis not present

## 2022-10-18 DIAGNOSIS — N186 End stage renal disease: Secondary | ICD-10-CM | POA: Diagnosis not present

## 2022-10-18 DIAGNOSIS — D631 Anemia in chronic kidney disease: Secondary | ICD-10-CM | POA: Diagnosis not present

## 2022-10-18 DIAGNOSIS — R17 Unspecified jaundice: Secondary | ICD-10-CM | POA: Diagnosis not present

## 2022-10-18 DIAGNOSIS — Z992 Dependence on renal dialysis: Secondary | ICD-10-CM | POA: Diagnosis not present

## 2022-10-18 DIAGNOSIS — D509 Iron deficiency anemia, unspecified: Secondary | ICD-10-CM | POA: Diagnosis not present

## 2022-10-18 DIAGNOSIS — E875 Hyperkalemia: Secondary | ICD-10-CM | POA: Diagnosis not present

## 2022-10-18 DIAGNOSIS — E440 Moderate protein-calorie malnutrition: Secondary | ICD-10-CM | POA: Diagnosis not present

## 2022-10-20 DIAGNOSIS — N2581 Secondary hyperparathyroidism of renal origin: Secondary | ICD-10-CM | POA: Diagnosis not present

## 2022-10-20 DIAGNOSIS — R82998 Other abnormal findings in urine: Secondary | ICD-10-CM | POA: Diagnosis not present

## 2022-10-20 DIAGNOSIS — Z992 Dependence on renal dialysis: Secondary | ICD-10-CM | POA: Diagnosis not present

## 2022-10-20 DIAGNOSIS — D631 Anemia in chronic kidney disease: Secondary | ICD-10-CM | POA: Diagnosis not present

## 2022-10-20 DIAGNOSIS — E875 Hyperkalemia: Secondary | ICD-10-CM | POA: Diagnosis not present

## 2022-10-20 DIAGNOSIS — R17 Unspecified jaundice: Secondary | ICD-10-CM | POA: Diagnosis not present

## 2022-10-20 DIAGNOSIS — D509 Iron deficiency anemia, unspecified: Secondary | ICD-10-CM | POA: Diagnosis not present

## 2022-10-20 DIAGNOSIS — E440 Moderate protein-calorie malnutrition: Secondary | ICD-10-CM | POA: Diagnosis not present

## 2022-10-20 DIAGNOSIS — N186 End stage renal disease: Secondary | ICD-10-CM | POA: Diagnosis not present

## 2022-10-20 DIAGNOSIS — Z79899 Other long term (current) drug therapy: Secondary | ICD-10-CM | POA: Diagnosis not present

## 2022-10-23 DIAGNOSIS — N2581 Secondary hyperparathyroidism of renal origin: Secondary | ICD-10-CM | POA: Diagnosis not present

## 2022-10-23 DIAGNOSIS — E440 Moderate protein-calorie malnutrition: Secondary | ICD-10-CM | POA: Diagnosis not present

## 2022-10-23 DIAGNOSIS — R17 Unspecified jaundice: Secondary | ICD-10-CM | POA: Diagnosis not present

## 2022-10-23 DIAGNOSIS — E875 Hyperkalemia: Secondary | ICD-10-CM | POA: Diagnosis not present

## 2022-10-23 DIAGNOSIS — Z992 Dependence on renal dialysis: Secondary | ICD-10-CM | POA: Diagnosis not present

## 2022-10-23 DIAGNOSIS — D631 Anemia in chronic kidney disease: Secondary | ICD-10-CM | POA: Diagnosis not present

## 2022-10-23 DIAGNOSIS — D509 Iron deficiency anemia, unspecified: Secondary | ICD-10-CM | POA: Diagnosis not present

## 2022-10-23 DIAGNOSIS — Z79899 Other long term (current) drug therapy: Secondary | ICD-10-CM | POA: Diagnosis not present

## 2022-10-23 DIAGNOSIS — N186 End stage renal disease: Secondary | ICD-10-CM | POA: Diagnosis not present

## 2022-10-23 DIAGNOSIS — R82998 Other abnormal findings in urine: Secondary | ICD-10-CM | POA: Diagnosis not present

## 2022-10-24 DIAGNOSIS — D509 Iron deficiency anemia, unspecified: Secondary | ICD-10-CM | POA: Diagnosis not present

## 2022-10-24 DIAGNOSIS — E440 Moderate protein-calorie malnutrition: Secondary | ICD-10-CM | POA: Diagnosis not present

## 2022-10-24 DIAGNOSIS — D631 Anemia in chronic kidney disease: Secondary | ICD-10-CM | POA: Diagnosis not present

## 2022-10-24 DIAGNOSIS — N2581 Secondary hyperparathyroidism of renal origin: Secondary | ICD-10-CM | POA: Diagnosis not present

## 2022-10-24 DIAGNOSIS — E875 Hyperkalemia: Secondary | ICD-10-CM | POA: Diagnosis not present

## 2022-10-24 DIAGNOSIS — R82998 Other abnormal findings in urine: Secondary | ICD-10-CM | POA: Diagnosis not present

## 2022-10-24 DIAGNOSIS — Z79899 Other long term (current) drug therapy: Secondary | ICD-10-CM | POA: Diagnosis not present

## 2022-10-24 DIAGNOSIS — Z992 Dependence on renal dialysis: Secondary | ICD-10-CM | POA: Diagnosis not present

## 2022-10-24 DIAGNOSIS — R17 Unspecified jaundice: Secondary | ICD-10-CM | POA: Diagnosis not present

## 2022-10-24 DIAGNOSIS — N186 End stage renal disease: Secondary | ICD-10-CM | POA: Diagnosis not present

## 2022-10-25 DIAGNOSIS — D509 Iron deficiency anemia, unspecified: Secondary | ICD-10-CM | POA: Diagnosis not present

## 2022-10-25 DIAGNOSIS — E440 Moderate protein-calorie malnutrition: Secondary | ICD-10-CM | POA: Diagnosis not present

## 2022-10-25 DIAGNOSIS — E875 Hyperkalemia: Secondary | ICD-10-CM | POA: Diagnosis not present

## 2022-10-25 DIAGNOSIS — R82998 Other abnormal findings in urine: Secondary | ICD-10-CM | POA: Diagnosis not present

## 2022-10-25 DIAGNOSIS — N186 End stage renal disease: Secondary | ICD-10-CM | POA: Diagnosis not present

## 2022-10-25 DIAGNOSIS — R17 Unspecified jaundice: Secondary | ICD-10-CM | POA: Diagnosis not present

## 2022-10-25 DIAGNOSIS — D631 Anemia in chronic kidney disease: Secondary | ICD-10-CM | POA: Diagnosis not present

## 2022-10-25 DIAGNOSIS — N2581 Secondary hyperparathyroidism of renal origin: Secondary | ICD-10-CM | POA: Diagnosis not present

## 2022-10-25 DIAGNOSIS — Z992 Dependence on renal dialysis: Secondary | ICD-10-CM | POA: Diagnosis not present

## 2022-10-25 DIAGNOSIS — Z79899 Other long term (current) drug therapy: Secondary | ICD-10-CM | POA: Diagnosis not present

## 2022-10-28 DIAGNOSIS — Z79899 Other long term (current) drug therapy: Secondary | ICD-10-CM | POA: Diagnosis not present

## 2022-10-28 DIAGNOSIS — Z992 Dependence on renal dialysis: Secondary | ICD-10-CM | POA: Diagnosis not present

## 2022-10-28 DIAGNOSIS — E440 Moderate protein-calorie malnutrition: Secondary | ICD-10-CM | POA: Diagnosis not present

## 2022-10-28 DIAGNOSIS — E875 Hyperkalemia: Secondary | ICD-10-CM | POA: Diagnosis not present

## 2022-10-28 DIAGNOSIS — D509 Iron deficiency anemia, unspecified: Secondary | ICD-10-CM | POA: Diagnosis not present

## 2022-10-28 DIAGNOSIS — N186 End stage renal disease: Secondary | ICD-10-CM | POA: Diagnosis not present

## 2022-10-28 DIAGNOSIS — R17 Unspecified jaundice: Secondary | ICD-10-CM | POA: Diagnosis not present

## 2022-10-28 DIAGNOSIS — N2581 Secondary hyperparathyroidism of renal origin: Secondary | ICD-10-CM | POA: Diagnosis not present

## 2022-10-28 DIAGNOSIS — R82998 Other abnormal findings in urine: Secondary | ICD-10-CM | POA: Diagnosis not present

## 2022-10-28 DIAGNOSIS — D631 Anemia in chronic kidney disease: Secondary | ICD-10-CM | POA: Diagnosis not present

## 2022-10-29 DIAGNOSIS — E875 Hyperkalemia: Secondary | ICD-10-CM | POA: Diagnosis not present

## 2022-10-29 DIAGNOSIS — R82998 Other abnormal findings in urine: Secondary | ICD-10-CM | POA: Diagnosis not present

## 2022-10-29 DIAGNOSIS — D509 Iron deficiency anemia, unspecified: Secondary | ICD-10-CM | POA: Diagnosis not present

## 2022-10-29 DIAGNOSIS — N2581 Secondary hyperparathyroidism of renal origin: Secondary | ICD-10-CM | POA: Diagnosis not present

## 2022-10-29 DIAGNOSIS — Z79899 Other long term (current) drug therapy: Secondary | ICD-10-CM | POA: Diagnosis not present

## 2022-10-29 DIAGNOSIS — E440 Moderate protein-calorie malnutrition: Secondary | ICD-10-CM | POA: Diagnosis not present

## 2022-10-29 DIAGNOSIS — Z992 Dependence on renal dialysis: Secondary | ICD-10-CM | POA: Diagnosis not present

## 2022-10-29 DIAGNOSIS — R17 Unspecified jaundice: Secondary | ICD-10-CM | POA: Diagnosis not present

## 2022-10-29 DIAGNOSIS — N186 End stage renal disease: Secondary | ICD-10-CM | POA: Diagnosis not present

## 2022-10-29 DIAGNOSIS — D631 Anemia in chronic kidney disease: Secondary | ICD-10-CM | POA: Diagnosis not present

## 2022-11-01 DIAGNOSIS — Z992 Dependence on renal dialysis: Secondary | ICD-10-CM | POA: Diagnosis not present

## 2022-11-01 DIAGNOSIS — N186 End stage renal disease: Secondary | ICD-10-CM | POA: Diagnosis not present

## 2022-11-01 DIAGNOSIS — N049 Nephrotic syndrome with unspecified morphologic changes: Secondary | ICD-10-CM | POA: Diagnosis not present

## 2022-11-02 DIAGNOSIS — N2581 Secondary hyperparathyroidism of renal origin: Secondary | ICD-10-CM | POA: Diagnosis not present

## 2022-11-02 DIAGNOSIS — D509 Iron deficiency anemia, unspecified: Secondary | ICD-10-CM | POA: Diagnosis not present

## 2022-11-02 DIAGNOSIS — R82998 Other abnormal findings in urine: Secondary | ICD-10-CM | POA: Diagnosis not present

## 2022-11-02 DIAGNOSIS — Z992 Dependence on renal dialysis: Secondary | ICD-10-CM | POA: Diagnosis not present

## 2022-11-02 DIAGNOSIS — N186 End stage renal disease: Secondary | ICD-10-CM | POA: Diagnosis not present

## 2022-11-02 DIAGNOSIS — N2589 Other disorders resulting from impaired renal tubular function: Secondary | ICD-10-CM | POA: Diagnosis not present

## 2022-11-02 DIAGNOSIS — D631 Anemia in chronic kidney disease: Secondary | ICD-10-CM | POA: Diagnosis not present

## 2022-11-02 DIAGNOSIS — E875 Hyperkalemia: Secondary | ICD-10-CM | POA: Diagnosis not present

## 2022-11-05 DIAGNOSIS — N2589 Other disorders resulting from impaired renal tubular function: Secondary | ICD-10-CM | POA: Diagnosis not present

## 2022-11-05 DIAGNOSIS — N186 End stage renal disease: Secondary | ICD-10-CM | POA: Diagnosis not present

## 2022-11-05 DIAGNOSIS — E875 Hyperkalemia: Secondary | ICD-10-CM | POA: Diagnosis not present

## 2022-11-05 DIAGNOSIS — R82998 Other abnormal findings in urine: Secondary | ICD-10-CM | POA: Diagnosis not present

## 2022-11-05 DIAGNOSIS — N2581 Secondary hyperparathyroidism of renal origin: Secondary | ICD-10-CM | POA: Diagnosis not present

## 2022-11-05 DIAGNOSIS — D631 Anemia in chronic kidney disease: Secondary | ICD-10-CM | POA: Diagnosis not present

## 2022-11-05 DIAGNOSIS — Z992 Dependence on renal dialysis: Secondary | ICD-10-CM | POA: Diagnosis not present

## 2022-11-05 DIAGNOSIS — D509 Iron deficiency anemia, unspecified: Secondary | ICD-10-CM | POA: Diagnosis not present

## 2022-11-06 DIAGNOSIS — N186 End stage renal disease: Secondary | ICD-10-CM | POA: Diagnosis not present

## 2022-11-06 DIAGNOSIS — Z992 Dependence on renal dialysis: Secondary | ICD-10-CM | POA: Diagnosis not present

## 2022-11-06 DIAGNOSIS — E875 Hyperkalemia: Secondary | ICD-10-CM | POA: Diagnosis not present

## 2022-11-06 DIAGNOSIS — N2581 Secondary hyperparathyroidism of renal origin: Secondary | ICD-10-CM | POA: Diagnosis not present

## 2022-11-06 DIAGNOSIS — N2589 Other disorders resulting from impaired renal tubular function: Secondary | ICD-10-CM | POA: Diagnosis not present

## 2022-11-06 DIAGNOSIS — D509 Iron deficiency anemia, unspecified: Secondary | ICD-10-CM | POA: Diagnosis not present

## 2022-11-06 DIAGNOSIS — R82998 Other abnormal findings in urine: Secondary | ICD-10-CM | POA: Diagnosis not present

## 2022-11-06 DIAGNOSIS — D631 Anemia in chronic kidney disease: Secondary | ICD-10-CM | POA: Diagnosis not present

## 2022-11-07 DIAGNOSIS — E875 Hyperkalemia: Secondary | ICD-10-CM | POA: Diagnosis not present

## 2022-11-07 DIAGNOSIS — N2581 Secondary hyperparathyroidism of renal origin: Secondary | ICD-10-CM | POA: Diagnosis not present

## 2022-11-07 DIAGNOSIS — Z992 Dependence on renal dialysis: Secondary | ICD-10-CM | POA: Diagnosis not present

## 2022-11-07 DIAGNOSIS — D509 Iron deficiency anemia, unspecified: Secondary | ICD-10-CM | POA: Diagnosis not present

## 2022-11-07 DIAGNOSIS — N2589 Other disorders resulting from impaired renal tubular function: Secondary | ICD-10-CM | POA: Diagnosis not present

## 2022-11-07 DIAGNOSIS — N186 End stage renal disease: Secondary | ICD-10-CM | POA: Diagnosis not present

## 2022-11-07 DIAGNOSIS — R82998 Other abnormal findings in urine: Secondary | ICD-10-CM | POA: Diagnosis not present

## 2022-11-07 DIAGNOSIS — D631 Anemia in chronic kidney disease: Secondary | ICD-10-CM | POA: Diagnosis not present

## 2022-11-09 DIAGNOSIS — D509 Iron deficiency anemia, unspecified: Secondary | ICD-10-CM | POA: Diagnosis not present

## 2022-11-09 DIAGNOSIS — N186 End stage renal disease: Secondary | ICD-10-CM | POA: Diagnosis not present

## 2022-11-09 DIAGNOSIS — N2581 Secondary hyperparathyroidism of renal origin: Secondary | ICD-10-CM | POA: Diagnosis not present

## 2022-11-09 DIAGNOSIS — E875 Hyperkalemia: Secondary | ICD-10-CM | POA: Diagnosis not present

## 2022-11-09 DIAGNOSIS — N2589 Other disorders resulting from impaired renal tubular function: Secondary | ICD-10-CM | POA: Diagnosis not present

## 2022-11-09 DIAGNOSIS — R82998 Other abnormal findings in urine: Secondary | ICD-10-CM | POA: Diagnosis not present

## 2022-11-09 DIAGNOSIS — Z992 Dependence on renal dialysis: Secondary | ICD-10-CM | POA: Diagnosis not present

## 2022-11-09 DIAGNOSIS — D631 Anemia in chronic kidney disease: Secondary | ICD-10-CM | POA: Diagnosis not present

## 2022-11-12 DIAGNOSIS — N2589 Other disorders resulting from impaired renal tubular function: Secondary | ICD-10-CM | POA: Diagnosis not present

## 2022-11-12 DIAGNOSIS — N2581 Secondary hyperparathyroidism of renal origin: Secondary | ICD-10-CM | POA: Diagnosis not present

## 2022-11-12 DIAGNOSIS — D631 Anemia in chronic kidney disease: Secondary | ICD-10-CM | POA: Diagnosis not present

## 2022-11-12 DIAGNOSIS — N186 End stage renal disease: Secondary | ICD-10-CM | POA: Diagnosis not present

## 2022-11-12 DIAGNOSIS — E875 Hyperkalemia: Secondary | ICD-10-CM | POA: Diagnosis not present

## 2022-11-12 DIAGNOSIS — R82998 Other abnormal findings in urine: Secondary | ICD-10-CM | POA: Diagnosis not present

## 2022-11-12 DIAGNOSIS — D509 Iron deficiency anemia, unspecified: Secondary | ICD-10-CM | POA: Diagnosis not present

## 2022-11-12 DIAGNOSIS — Z992 Dependence on renal dialysis: Secondary | ICD-10-CM | POA: Diagnosis not present

## 2022-11-14 DIAGNOSIS — N2589 Other disorders resulting from impaired renal tubular function: Secondary | ICD-10-CM | POA: Diagnosis not present

## 2022-11-14 DIAGNOSIS — D631 Anemia in chronic kidney disease: Secondary | ICD-10-CM | POA: Diagnosis not present

## 2022-11-14 DIAGNOSIS — D509 Iron deficiency anemia, unspecified: Secondary | ICD-10-CM | POA: Diagnosis not present

## 2022-11-14 DIAGNOSIS — N2581 Secondary hyperparathyroidism of renal origin: Secondary | ICD-10-CM | POA: Diagnosis not present

## 2022-11-14 DIAGNOSIS — N186 End stage renal disease: Secondary | ICD-10-CM | POA: Diagnosis not present

## 2022-11-14 DIAGNOSIS — R82998 Other abnormal findings in urine: Secondary | ICD-10-CM | POA: Diagnosis not present

## 2022-11-14 DIAGNOSIS — E875 Hyperkalemia: Secondary | ICD-10-CM | POA: Diagnosis not present

## 2022-11-14 DIAGNOSIS — Z992 Dependence on renal dialysis: Secondary | ICD-10-CM | POA: Diagnosis not present

## 2022-11-15 DIAGNOSIS — N2581 Secondary hyperparathyroidism of renal origin: Secondary | ICD-10-CM | POA: Diagnosis not present

## 2022-11-15 DIAGNOSIS — N186 End stage renal disease: Secondary | ICD-10-CM | POA: Diagnosis not present

## 2022-11-15 DIAGNOSIS — R82998 Other abnormal findings in urine: Secondary | ICD-10-CM | POA: Diagnosis not present

## 2022-11-15 DIAGNOSIS — E875 Hyperkalemia: Secondary | ICD-10-CM | POA: Diagnosis not present

## 2022-11-15 DIAGNOSIS — Z992 Dependence on renal dialysis: Secondary | ICD-10-CM | POA: Diagnosis not present

## 2022-11-15 DIAGNOSIS — N2589 Other disorders resulting from impaired renal tubular function: Secondary | ICD-10-CM | POA: Diagnosis not present

## 2022-11-15 DIAGNOSIS — D509 Iron deficiency anemia, unspecified: Secondary | ICD-10-CM | POA: Diagnosis not present

## 2022-11-15 DIAGNOSIS — D631 Anemia in chronic kidney disease: Secondary | ICD-10-CM | POA: Diagnosis not present

## 2022-11-17 DIAGNOSIS — E875 Hyperkalemia: Secondary | ICD-10-CM | POA: Diagnosis not present

## 2022-11-17 DIAGNOSIS — Z992 Dependence on renal dialysis: Secondary | ICD-10-CM | POA: Diagnosis not present

## 2022-11-17 DIAGNOSIS — N186 End stage renal disease: Secondary | ICD-10-CM | POA: Diagnosis not present

## 2022-11-17 DIAGNOSIS — D509 Iron deficiency anemia, unspecified: Secondary | ICD-10-CM | POA: Diagnosis not present

## 2022-11-17 DIAGNOSIS — D631 Anemia in chronic kidney disease: Secondary | ICD-10-CM | POA: Diagnosis not present

## 2022-11-17 DIAGNOSIS — R82998 Other abnormal findings in urine: Secondary | ICD-10-CM | POA: Diagnosis not present

## 2022-11-17 DIAGNOSIS — N2589 Other disorders resulting from impaired renal tubular function: Secondary | ICD-10-CM | POA: Diagnosis not present

## 2022-11-17 DIAGNOSIS — N2581 Secondary hyperparathyroidism of renal origin: Secondary | ICD-10-CM | POA: Diagnosis not present

## 2022-11-20 DIAGNOSIS — N186 End stage renal disease: Secondary | ICD-10-CM | POA: Diagnosis not present

## 2022-11-20 DIAGNOSIS — N2589 Other disorders resulting from impaired renal tubular function: Secondary | ICD-10-CM | POA: Diagnosis not present

## 2022-11-20 DIAGNOSIS — D509 Iron deficiency anemia, unspecified: Secondary | ICD-10-CM | POA: Diagnosis not present

## 2022-11-20 DIAGNOSIS — E875 Hyperkalemia: Secondary | ICD-10-CM | POA: Diagnosis not present

## 2022-11-20 DIAGNOSIS — N2581 Secondary hyperparathyroidism of renal origin: Secondary | ICD-10-CM | POA: Diagnosis not present

## 2022-11-20 DIAGNOSIS — Z992 Dependence on renal dialysis: Secondary | ICD-10-CM | POA: Diagnosis not present

## 2022-11-20 DIAGNOSIS — D631 Anemia in chronic kidney disease: Secondary | ICD-10-CM | POA: Diagnosis not present

## 2022-11-20 DIAGNOSIS — R82998 Other abnormal findings in urine: Secondary | ICD-10-CM | POA: Diagnosis not present

## 2022-11-22 DIAGNOSIS — E875 Hyperkalemia: Secondary | ICD-10-CM | POA: Diagnosis not present

## 2022-11-22 DIAGNOSIS — R82998 Other abnormal findings in urine: Secondary | ICD-10-CM | POA: Diagnosis not present

## 2022-11-22 DIAGNOSIS — D631 Anemia in chronic kidney disease: Secondary | ICD-10-CM | POA: Diagnosis not present

## 2022-11-22 DIAGNOSIS — N186 End stage renal disease: Secondary | ICD-10-CM | POA: Diagnosis not present

## 2022-11-22 DIAGNOSIS — N2581 Secondary hyperparathyroidism of renal origin: Secondary | ICD-10-CM | POA: Diagnosis not present

## 2022-11-22 DIAGNOSIS — D509 Iron deficiency anemia, unspecified: Secondary | ICD-10-CM | POA: Diagnosis not present

## 2022-11-22 DIAGNOSIS — N2589 Other disorders resulting from impaired renal tubular function: Secondary | ICD-10-CM | POA: Diagnosis not present

## 2022-11-22 DIAGNOSIS — Z992 Dependence on renal dialysis: Secondary | ICD-10-CM | POA: Diagnosis not present

## 2022-11-23 DIAGNOSIS — R82998 Other abnormal findings in urine: Secondary | ICD-10-CM | POA: Diagnosis not present

## 2022-11-23 DIAGNOSIS — E875 Hyperkalemia: Secondary | ICD-10-CM | POA: Diagnosis not present

## 2022-11-23 DIAGNOSIS — N2581 Secondary hyperparathyroidism of renal origin: Secondary | ICD-10-CM | POA: Diagnosis not present

## 2022-11-23 DIAGNOSIS — N2589 Other disorders resulting from impaired renal tubular function: Secondary | ICD-10-CM | POA: Diagnosis not present

## 2022-11-23 DIAGNOSIS — Z992 Dependence on renal dialysis: Secondary | ICD-10-CM | POA: Diagnosis not present

## 2022-11-23 DIAGNOSIS — D631 Anemia in chronic kidney disease: Secondary | ICD-10-CM | POA: Diagnosis not present

## 2022-11-23 DIAGNOSIS — D509 Iron deficiency anemia, unspecified: Secondary | ICD-10-CM | POA: Diagnosis not present

## 2022-11-23 DIAGNOSIS — N186 End stage renal disease: Secondary | ICD-10-CM | POA: Diagnosis not present

## 2022-11-26 DIAGNOSIS — E875 Hyperkalemia: Secondary | ICD-10-CM | POA: Diagnosis not present

## 2022-11-26 DIAGNOSIS — N2581 Secondary hyperparathyroidism of renal origin: Secondary | ICD-10-CM | POA: Diagnosis not present

## 2022-11-26 DIAGNOSIS — R82998 Other abnormal findings in urine: Secondary | ICD-10-CM | POA: Diagnosis not present

## 2022-11-26 DIAGNOSIS — D631 Anemia in chronic kidney disease: Secondary | ICD-10-CM | POA: Diagnosis not present

## 2022-11-26 DIAGNOSIS — Z992 Dependence on renal dialysis: Secondary | ICD-10-CM | POA: Diagnosis not present

## 2022-11-26 DIAGNOSIS — D509 Iron deficiency anemia, unspecified: Secondary | ICD-10-CM | POA: Diagnosis not present

## 2022-11-26 DIAGNOSIS — N186 End stage renal disease: Secondary | ICD-10-CM | POA: Diagnosis not present

## 2022-11-26 DIAGNOSIS — N2589 Other disorders resulting from impaired renal tubular function: Secondary | ICD-10-CM | POA: Diagnosis not present

## 2022-11-27 DIAGNOSIS — N2581 Secondary hyperparathyroidism of renal origin: Secondary | ICD-10-CM | POA: Diagnosis not present

## 2022-11-27 DIAGNOSIS — N186 End stage renal disease: Secondary | ICD-10-CM | POA: Diagnosis not present

## 2022-11-27 DIAGNOSIS — D509 Iron deficiency anemia, unspecified: Secondary | ICD-10-CM | POA: Diagnosis not present

## 2022-11-27 DIAGNOSIS — Z992 Dependence on renal dialysis: Secondary | ICD-10-CM | POA: Diagnosis not present

## 2022-11-27 DIAGNOSIS — E875 Hyperkalemia: Secondary | ICD-10-CM | POA: Diagnosis not present

## 2022-11-27 DIAGNOSIS — R82998 Other abnormal findings in urine: Secondary | ICD-10-CM | POA: Diagnosis not present

## 2022-11-27 DIAGNOSIS — N2589 Other disorders resulting from impaired renal tubular function: Secondary | ICD-10-CM | POA: Diagnosis not present

## 2022-11-27 DIAGNOSIS — D631 Anemia in chronic kidney disease: Secondary | ICD-10-CM | POA: Diagnosis not present

## 2022-11-28 DIAGNOSIS — R82998 Other abnormal findings in urine: Secondary | ICD-10-CM | POA: Diagnosis not present

## 2022-11-28 DIAGNOSIS — E875 Hyperkalemia: Secondary | ICD-10-CM | POA: Diagnosis not present

## 2022-11-28 DIAGNOSIS — Z992 Dependence on renal dialysis: Secondary | ICD-10-CM | POA: Diagnosis not present

## 2022-11-28 DIAGNOSIS — N186 End stage renal disease: Secondary | ICD-10-CM | POA: Diagnosis not present

## 2022-11-28 DIAGNOSIS — D509 Iron deficiency anemia, unspecified: Secondary | ICD-10-CM | POA: Diagnosis not present

## 2022-11-28 DIAGNOSIS — N2581 Secondary hyperparathyroidism of renal origin: Secondary | ICD-10-CM | POA: Diagnosis not present

## 2022-11-28 DIAGNOSIS — N2589 Other disorders resulting from impaired renal tubular function: Secondary | ICD-10-CM | POA: Diagnosis not present

## 2022-11-28 DIAGNOSIS — D631 Anemia in chronic kidney disease: Secondary | ICD-10-CM | POA: Diagnosis not present

## 2022-11-29 DIAGNOSIS — E875 Hyperkalemia: Secondary | ICD-10-CM | POA: Diagnosis not present

## 2022-11-29 DIAGNOSIS — D631 Anemia in chronic kidney disease: Secondary | ICD-10-CM | POA: Diagnosis not present

## 2022-11-29 DIAGNOSIS — N186 End stage renal disease: Secondary | ICD-10-CM | POA: Diagnosis not present

## 2022-11-29 DIAGNOSIS — D509 Iron deficiency anemia, unspecified: Secondary | ICD-10-CM | POA: Diagnosis not present

## 2022-11-29 DIAGNOSIS — R82998 Other abnormal findings in urine: Secondary | ICD-10-CM | POA: Diagnosis not present

## 2022-11-29 DIAGNOSIS — N2589 Other disorders resulting from impaired renal tubular function: Secondary | ICD-10-CM | POA: Diagnosis not present

## 2022-11-29 DIAGNOSIS — Z992 Dependence on renal dialysis: Secondary | ICD-10-CM | POA: Diagnosis not present

## 2022-11-29 DIAGNOSIS — N2581 Secondary hyperparathyroidism of renal origin: Secondary | ICD-10-CM | POA: Diagnosis not present

## 2022-12-01 DIAGNOSIS — N2589 Other disorders resulting from impaired renal tubular function: Secondary | ICD-10-CM | POA: Diagnosis not present

## 2022-12-01 DIAGNOSIS — D631 Anemia in chronic kidney disease: Secondary | ICD-10-CM | POA: Diagnosis not present

## 2022-12-01 DIAGNOSIS — E875 Hyperkalemia: Secondary | ICD-10-CM | POA: Diagnosis not present

## 2022-12-01 DIAGNOSIS — R82998 Other abnormal findings in urine: Secondary | ICD-10-CM | POA: Diagnosis not present

## 2022-12-01 DIAGNOSIS — N049 Nephrotic syndrome with unspecified morphologic changes: Secondary | ICD-10-CM | POA: Diagnosis not present

## 2022-12-01 DIAGNOSIS — D509 Iron deficiency anemia, unspecified: Secondary | ICD-10-CM | POA: Diagnosis not present

## 2022-12-01 DIAGNOSIS — N186 End stage renal disease: Secondary | ICD-10-CM | POA: Diagnosis not present

## 2022-12-01 DIAGNOSIS — N2581 Secondary hyperparathyroidism of renal origin: Secondary | ICD-10-CM | POA: Diagnosis not present

## 2022-12-01 DIAGNOSIS — Z992 Dependence on renal dialysis: Secondary | ICD-10-CM | POA: Diagnosis not present

## 2022-12-02 DIAGNOSIS — D631 Anemia in chronic kidney disease: Secondary | ICD-10-CM | POA: Diagnosis not present

## 2022-12-02 DIAGNOSIS — N186 End stage renal disease: Secondary | ICD-10-CM | POA: Diagnosis not present

## 2022-12-02 DIAGNOSIS — N2581 Secondary hyperparathyroidism of renal origin: Secondary | ICD-10-CM | POA: Diagnosis not present

## 2022-12-02 DIAGNOSIS — D509 Iron deficiency anemia, unspecified: Secondary | ICD-10-CM | POA: Diagnosis not present

## 2022-12-02 DIAGNOSIS — Z79899 Other long term (current) drug therapy: Secondary | ICD-10-CM | POA: Diagnosis not present

## 2022-12-02 DIAGNOSIS — E440 Moderate protein-calorie malnutrition: Secondary | ICD-10-CM | POA: Diagnosis not present

## 2022-12-02 DIAGNOSIS — R17 Unspecified jaundice: Secondary | ICD-10-CM | POA: Diagnosis not present

## 2022-12-02 DIAGNOSIS — E875 Hyperkalemia: Secondary | ICD-10-CM | POA: Diagnosis not present

## 2022-12-02 DIAGNOSIS — Z992 Dependence on renal dialysis: Secondary | ICD-10-CM | POA: Diagnosis not present

## 2022-12-02 DIAGNOSIS — R82998 Other abnormal findings in urine: Secondary | ICD-10-CM | POA: Diagnosis not present

## 2022-12-03 DIAGNOSIS — R17 Unspecified jaundice: Secondary | ICD-10-CM | POA: Diagnosis not present

## 2022-12-03 DIAGNOSIS — E875 Hyperkalemia: Secondary | ICD-10-CM | POA: Diagnosis not present

## 2022-12-03 DIAGNOSIS — R82998 Other abnormal findings in urine: Secondary | ICD-10-CM | POA: Diagnosis not present

## 2022-12-03 DIAGNOSIS — Z992 Dependence on renal dialysis: Secondary | ICD-10-CM | POA: Diagnosis not present

## 2022-12-03 DIAGNOSIS — E440 Moderate protein-calorie malnutrition: Secondary | ICD-10-CM | POA: Diagnosis not present

## 2022-12-03 DIAGNOSIS — D631 Anemia in chronic kidney disease: Secondary | ICD-10-CM | POA: Diagnosis not present

## 2022-12-03 DIAGNOSIS — N186 End stage renal disease: Secondary | ICD-10-CM | POA: Diagnosis not present

## 2022-12-03 DIAGNOSIS — N2581 Secondary hyperparathyroidism of renal origin: Secondary | ICD-10-CM | POA: Diagnosis not present

## 2022-12-03 DIAGNOSIS — D509 Iron deficiency anemia, unspecified: Secondary | ICD-10-CM | POA: Diagnosis not present

## 2022-12-03 DIAGNOSIS — Z79899 Other long term (current) drug therapy: Secondary | ICD-10-CM | POA: Diagnosis not present

## 2022-12-06 DIAGNOSIS — D631 Anemia in chronic kidney disease: Secondary | ICD-10-CM | POA: Diagnosis not present

## 2022-12-06 DIAGNOSIS — Z79899 Other long term (current) drug therapy: Secondary | ICD-10-CM | POA: Diagnosis not present

## 2022-12-06 DIAGNOSIS — Z992 Dependence on renal dialysis: Secondary | ICD-10-CM | POA: Diagnosis not present

## 2022-12-06 DIAGNOSIS — E875 Hyperkalemia: Secondary | ICD-10-CM | POA: Diagnosis not present

## 2022-12-06 DIAGNOSIS — N186 End stage renal disease: Secondary | ICD-10-CM | POA: Diagnosis not present

## 2022-12-06 DIAGNOSIS — E440 Moderate protein-calorie malnutrition: Secondary | ICD-10-CM | POA: Diagnosis not present

## 2022-12-06 DIAGNOSIS — D509 Iron deficiency anemia, unspecified: Secondary | ICD-10-CM | POA: Diagnosis not present

## 2022-12-06 DIAGNOSIS — R82998 Other abnormal findings in urine: Secondary | ICD-10-CM | POA: Diagnosis not present

## 2022-12-06 DIAGNOSIS — R17 Unspecified jaundice: Secondary | ICD-10-CM | POA: Diagnosis not present

## 2022-12-06 DIAGNOSIS — N2581 Secondary hyperparathyroidism of renal origin: Secondary | ICD-10-CM | POA: Diagnosis not present

## 2022-12-10 DIAGNOSIS — D509 Iron deficiency anemia, unspecified: Secondary | ICD-10-CM | POA: Diagnosis not present

## 2022-12-10 DIAGNOSIS — E875 Hyperkalemia: Secondary | ICD-10-CM | POA: Diagnosis not present

## 2022-12-10 DIAGNOSIS — R82998 Other abnormal findings in urine: Secondary | ICD-10-CM | POA: Diagnosis not present

## 2022-12-10 DIAGNOSIS — N186 End stage renal disease: Secondary | ICD-10-CM | POA: Diagnosis not present

## 2022-12-10 DIAGNOSIS — Z992 Dependence on renal dialysis: Secondary | ICD-10-CM | POA: Diagnosis not present

## 2022-12-10 DIAGNOSIS — E440 Moderate protein-calorie malnutrition: Secondary | ICD-10-CM | POA: Diagnosis not present

## 2022-12-10 DIAGNOSIS — D631 Anemia in chronic kidney disease: Secondary | ICD-10-CM | POA: Diagnosis not present

## 2022-12-10 DIAGNOSIS — R17 Unspecified jaundice: Secondary | ICD-10-CM | POA: Diagnosis not present

## 2022-12-10 DIAGNOSIS — Z79899 Other long term (current) drug therapy: Secondary | ICD-10-CM | POA: Diagnosis not present

## 2022-12-10 DIAGNOSIS — N2581 Secondary hyperparathyroidism of renal origin: Secondary | ICD-10-CM | POA: Diagnosis not present

## 2022-12-11 DIAGNOSIS — D631 Anemia in chronic kidney disease: Secondary | ICD-10-CM | POA: Diagnosis not present

## 2022-12-11 DIAGNOSIS — Z992 Dependence on renal dialysis: Secondary | ICD-10-CM | POA: Diagnosis not present

## 2022-12-11 DIAGNOSIS — Z79899 Other long term (current) drug therapy: Secondary | ICD-10-CM | POA: Diagnosis not present

## 2022-12-11 DIAGNOSIS — D509 Iron deficiency anemia, unspecified: Secondary | ICD-10-CM | POA: Diagnosis not present

## 2022-12-11 DIAGNOSIS — R82998 Other abnormal findings in urine: Secondary | ICD-10-CM | POA: Diagnosis not present

## 2022-12-11 DIAGNOSIS — N2581 Secondary hyperparathyroidism of renal origin: Secondary | ICD-10-CM | POA: Diagnosis not present

## 2022-12-11 DIAGNOSIS — N186 End stage renal disease: Secondary | ICD-10-CM | POA: Diagnosis not present

## 2022-12-11 DIAGNOSIS — R17 Unspecified jaundice: Secondary | ICD-10-CM | POA: Diagnosis not present

## 2022-12-11 DIAGNOSIS — E875 Hyperkalemia: Secondary | ICD-10-CM | POA: Diagnosis not present

## 2022-12-11 DIAGNOSIS — E440 Moderate protein-calorie malnutrition: Secondary | ICD-10-CM | POA: Diagnosis not present

## 2022-12-13 DIAGNOSIS — R17 Unspecified jaundice: Secondary | ICD-10-CM | POA: Diagnosis not present

## 2022-12-13 DIAGNOSIS — E440 Moderate protein-calorie malnutrition: Secondary | ICD-10-CM | POA: Diagnosis not present

## 2022-12-13 DIAGNOSIS — E875 Hyperkalemia: Secondary | ICD-10-CM | POA: Diagnosis not present

## 2022-12-13 DIAGNOSIS — N186 End stage renal disease: Secondary | ICD-10-CM | POA: Diagnosis not present

## 2022-12-13 DIAGNOSIS — R82998 Other abnormal findings in urine: Secondary | ICD-10-CM | POA: Diagnosis not present

## 2022-12-13 DIAGNOSIS — D509 Iron deficiency anemia, unspecified: Secondary | ICD-10-CM | POA: Diagnosis not present

## 2022-12-13 DIAGNOSIS — D631 Anemia in chronic kidney disease: Secondary | ICD-10-CM | POA: Diagnosis not present

## 2022-12-13 DIAGNOSIS — Z79899 Other long term (current) drug therapy: Secondary | ICD-10-CM | POA: Diagnosis not present

## 2022-12-13 DIAGNOSIS — N2581 Secondary hyperparathyroidism of renal origin: Secondary | ICD-10-CM | POA: Diagnosis not present

## 2022-12-13 DIAGNOSIS — Z992 Dependence on renal dialysis: Secondary | ICD-10-CM | POA: Diagnosis not present

## 2022-12-15 DIAGNOSIS — E440 Moderate protein-calorie malnutrition: Secondary | ICD-10-CM | POA: Diagnosis not present

## 2022-12-15 DIAGNOSIS — R17 Unspecified jaundice: Secondary | ICD-10-CM | POA: Diagnosis not present

## 2022-12-15 DIAGNOSIS — N186 End stage renal disease: Secondary | ICD-10-CM | POA: Diagnosis not present

## 2022-12-15 DIAGNOSIS — R82998 Other abnormal findings in urine: Secondary | ICD-10-CM | POA: Diagnosis not present

## 2022-12-15 DIAGNOSIS — D631 Anemia in chronic kidney disease: Secondary | ICD-10-CM | POA: Diagnosis not present

## 2022-12-15 DIAGNOSIS — E875 Hyperkalemia: Secondary | ICD-10-CM | POA: Diagnosis not present

## 2022-12-15 DIAGNOSIS — N2581 Secondary hyperparathyroidism of renal origin: Secondary | ICD-10-CM | POA: Diagnosis not present

## 2022-12-15 DIAGNOSIS — Z79899 Other long term (current) drug therapy: Secondary | ICD-10-CM | POA: Diagnosis not present

## 2022-12-15 DIAGNOSIS — D509 Iron deficiency anemia, unspecified: Secondary | ICD-10-CM | POA: Diagnosis not present

## 2022-12-15 DIAGNOSIS — Z992 Dependence on renal dialysis: Secondary | ICD-10-CM | POA: Diagnosis not present

## 2022-12-18 DIAGNOSIS — Z992 Dependence on renal dialysis: Secondary | ICD-10-CM | POA: Diagnosis not present

## 2022-12-18 DIAGNOSIS — Z79899 Other long term (current) drug therapy: Secondary | ICD-10-CM | POA: Diagnosis not present

## 2022-12-18 DIAGNOSIS — R17 Unspecified jaundice: Secondary | ICD-10-CM | POA: Diagnosis not present

## 2022-12-18 DIAGNOSIS — E875 Hyperkalemia: Secondary | ICD-10-CM | POA: Diagnosis not present

## 2022-12-18 DIAGNOSIS — D631 Anemia in chronic kidney disease: Secondary | ICD-10-CM | POA: Diagnosis not present

## 2022-12-18 DIAGNOSIS — D509 Iron deficiency anemia, unspecified: Secondary | ICD-10-CM | POA: Diagnosis not present

## 2022-12-18 DIAGNOSIS — R82998 Other abnormal findings in urine: Secondary | ICD-10-CM | POA: Diagnosis not present

## 2022-12-18 DIAGNOSIS — E440 Moderate protein-calorie malnutrition: Secondary | ICD-10-CM | POA: Diagnosis not present

## 2022-12-18 DIAGNOSIS — N2581 Secondary hyperparathyroidism of renal origin: Secondary | ICD-10-CM | POA: Diagnosis not present

## 2022-12-18 DIAGNOSIS — N186 End stage renal disease: Secondary | ICD-10-CM | POA: Diagnosis not present

## 2022-12-20 DIAGNOSIS — D509 Iron deficiency anemia, unspecified: Secondary | ICD-10-CM | POA: Diagnosis not present

## 2022-12-20 DIAGNOSIS — R17 Unspecified jaundice: Secondary | ICD-10-CM | POA: Diagnosis not present

## 2022-12-20 DIAGNOSIS — R82998 Other abnormal findings in urine: Secondary | ICD-10-CM | POA: Diagnosis not present

## 2022-12-20 DIAGNOSIS — Z992 Dependence on renal dialysis: Secondary | ICD-10-CM | POA: Diagnosis not present

## 2022-12-20 DIAGNOSIS — E440 Moderate protein-calorie malnutrition: Secondary | ICD-10-CM | POA: Diagnosis not present

## 2022-12-20 DIAGNOSIS — D631 Anemia in chronic kidney disease: Secondary | ICD-10-CM | POA: Diagnosis not present

## 2022-12-20 DIAGNOSIS — N2581 Secondary hyperparathyroidism of renal origin: Secondary | ICD-10-CM | POA: Diagnosis not present

## 2022-12-20 DIAGNOSIS — N186 End stage renal disease: Secondary | ICD-10-CM | POA: Diagnosis not present

## 2022-12-20 DIAGNOSIS — E875 Hyperkalemia: Secondary | ICD-10-CM | POA: Diagnosis not present

## 2022-12-20 DIAGNOSIS — Z79899 Other long term (current) drug therapy: Secondary | ICD-10-CM | POA: Diagnosis not present

## 2022-12-21 DIAGNOSIS — N2581 Secondary hyperparathyroidism of renal origin: Secondary | ICD-10-CM | POA: Diagnosis not present

## 2022-12-21 DIAGNOSIS — R17 Unspecified jaundice: Secondary | ICD-10-CM | POA: Diagnosis not present

## 2022-12-21 DIAGNOSIS — R82998 Other abnormal findings in urine: Secondary | ICD-10-CM | POA: Diagnosis not present

## 2022-12-21 DIAGNOSIS — D631 Anemia in chronic kidney disease: Secondary | ICD-10-CM | POA: Diagnosis not present

## 2022-12-21 DIAGNOSIS — Z79899 Other long term (current) drug therapy: Secondary | ICD-10-CM | POA: Diagnosis not present

## 2022-12-21 DIAGNOSIS — N186 End stage renal disease: Secondary | ICD-10-CM | POA: Diagnosis not present

## 2022-12-21 DIAGNOSIS — E440 Moderate protein-calorie malnutrition: Secondary | ICD-10-CM | POA: Diagnosis not present

## 2022-12-21 DIAGNOSIS — Z992 Dependence on renal dialysis: Secondary | ICD-10-CM | POA: Diagnosis not present

## 2022-12-21 DIAGNOSIS — E875 Hyperkalemia: Secondary | ICD-10-CM | POA: Diagnosis not present

## 2022-12-21 DIAGNOSIS — D509 Iron deficiency anemia, unspecified: Secondary | ICD-10-CM | POA: Diagnosis not present

## 2022-12-23 DIAGNOSIS — D509 Iron deficiency anemia, unspecified: Secondary | ICD-10-CM | POA: Diagnosis not present

## 2022-12-23 DIAGNOSIS — E875 Hyperkalemia: Secondary | ICD-10-CM | POA: Diagnosis not present

## 2022-12-23 DIAGNOSIS — R17 Unspecified jaundice: Secondary | ICD-10-CM | POA: Diagnosis not present

## 2022-12-23 DIAGNOSIS — D631 Anemia in chronic kidney disease: Secondary | ICD-10-CM | POA: Diagnosis not present

## 2022-12-23 DIAGNOSIS — E440 Moderate protein-calorie malnutrition: Secondary | ICD-10-CM | POA: Diagnosis not present

## 2022-12-23 DIAGNOSIS — Z79899 Other long term (current) drug therapy: Secondary | ICD-10-CM | POA: Diagnosis not present

## 2022-12-23 DIAGNOSIS — N186 End stage renal disease: Secondary | ICD-10-CM | POA: Diagnosis not present

## 2022-12-23 DIAGNOSIS — N2581 Secondary hyperparathyroidism of renal origin: Secondary | ICD-10-CM | POA: Diagnosis not present

## 2022-12-23 DIAGNOSIS — Z992 Dependence on renal dialysis: Secondary | ICD-10-CM | POA: Diagnosis not present

## 2022-12-23 DIAGNOSIS — R82998 Other abnormal findings in urine: Secondary | ICD-10-CM | POA: Diagnosis not present

## 2022-12-25 DIAGNOSIS — E875 Hyperkalemia: Secondary | ICD-10-CM | POA: Diagnosis not present

## 2022-12-25 DIAGNOSIS — R82998 Other abnormal findings in urine: Secondary | ICD-10-CM | POA: Diagnosis not present

## 2022-12-25 DIAGNOSIS — D631 Anemia in chronic kidney disease: Secondary | ICD-10-CM | POA: Diagnosis not present

## 2022-12-25 DIAGNOSIS — E440 Moderate protein-calorie malnutrition: Secondary | ICD-10-CM | POA: Diagnosis not present

## 2022-12-25 DIAGNOSIS — Z79899 Other long term (current) drug therapy: Secondary | ICD-10-CM | POA: Diagnosis not present

## 2022-12-25 DIAGNOSIS — N2581 Secondary hyperparathyroidism of renal origin: Secondary | ICD-10-CM | POA: Diagnosis not present

## 2022-12-25 DIAGNOSIS — N186 End stage renal disease: Secondary | ICD-10-CM | POA: Diagnosis not present

## 2022-12-25 DIAGNOSIS — R17 Unspecified jaundice: Secondary | ICD-10-CM | POA: Diagnosis not present

## 2022-12-25 DIAGNOSIS — Z992 Dependence on renal dialysis: Secondary | ICD-10-CM | POA: Diagnosis not present

## 2022-12-25 DIAGNOSIS — D509 Iron deficiency anemia, unspecified: Secondary | ICD-10-CM | POA: Diagnosis not present

## 2022-12-27 DIAGNOSIS — D631 Anemia in chronic kidney disease: Secondary | ICD-10-CM | POA: Diagnosis not present

## 2022-12-27 DIAGNOSIS — E875 Hyperkalemia: Secondary | ICD-10-CM | POA: Diagnosis not present

## 2022-12-27 DIAGNOSIS — R82998 Other abnormal findings in urine: Secondary | ICD-10-CM | POA: Diagnosis not present

## 2022-12-27 DIAGNOSIS — E440 Moderate protein-calorie malnutrition: Secondary | ICD-10-CM | POA: Diagnosis not present

## 2022-12-27 DIAGNOSIS — Z992 Dependence on renal dialysis: Secondary | ICD-10-CM | POA: Diagnosis not present

## 2022-12-27 DIAGNOSIS — N186 End stage renal disease: Secondary | ICD-10-CM | POA: Diagnosis not present

## 2022-12-27 DIAGNOSIS — D509 Iron deficiency anemia, unspecified: Secondary | ICD-10-CM | POA: Diagnosis not present

## 2022-12-27 DIAGNOSIS — N2581 Secondary hyperparathyroidism of renal origin: Secondary | ICD-10-CM | POA: Diagnosis not present

## 2022-12-27 DIAGNOSIS — R17 Unspecified jaundice: Secondary | ICD-10-CM | POA: Diagnosis not present

## 2022-12-27 DIAGNOSIS — Z79899 Other long term (current) drug therapy: Secondary | ICD-10-CM | POA: Diagnosis not present

## 2022-12-29 DIAGNOSIS — D631 Anemia in chronic kidney disease: Secondary | ICD-10-CM | POA: Diagnosis not present

## 2022-12-29 DIAGNOSIS — Z992 Dependence on renal dialysis: Secondary | ICD-10-CM | POA: Diagnosis not present

## 2022-12-29 DIAGNOSIS — R82998 Other abnormal findings in urine: Secondary | ICD-10-CM | POA: Diagnosis not present

## 2022-12-29 DIAGNOSIS — Z79899 Other long term (current) drug therapy: Secondary | ICD-10-CM | POA: Diagnosis not present

## 2022-12-29 DIAGNOSIS — N186 End stage renal disease: Secondary | ICD-10-CM | POA: Diagnosis not present

## 2022-12-29 DIAGNOSIS — R17 Unspecified jaundice: Secondary | ICD-10-CM | POA: Diagnosis not present

## 2022-12-29 DIAGNOSIS — E440 Moderate protein-calorie malnutrition: Secondary | ICD-10-CM | POA: Diagnosis not present

## 2022-12-29 DIAGNOSIS — E875 Hyperkalemia: Secondary | ICD-10-CM | POA: Diagnosis not present

## 2022-12-29 DIAGNOSIS — D509 Iron deficiency anemia, unspecified: Secondary | ICD-10-CM | POA: Diagnosis not present

## 2022-12-29 DIAGNOSIS — N2581 Secondary hyperparathyroidism of renal origin: Secondary | ICD-10-CM | POA: Diagnosis not present

## 2022-12-31 DIAGNOSIS — R17 Unspecified jaundice: Secondary | ICD-10-CM | POA: Diagnosis not present

## 2022-12-31 DIAGNOSIS — Z992 Dependence on renal dialysis: Secondary | ICD-10-CM | POA: Diagnosis not present

## 2022-12-31 DIAGNOSIS — D509 Iron deficiency anemia, unspecified: Secondary | ICD-10-CM | POA: Diagnosis not present

## 2022-12-31 DIAGNOSIS — D631 Anemia in chronic kidney disease: Secondary | ICD-10-CM | POA: Diagnosis not present

## 2022-12-31 DIAGNOSIS — N186 End stage renal disease: Secondary | ICD-10-CM | POA: Diagnosis not present

## 2022-12-31 DIAGNOSIS — R82998 Other abnormal findings in urine: Secondary | ICD-10-CM | POA: Diagnosis not present

## 2022-12-31 DIAGNOSIS — E875 Hyperkalemia: Secondary | ICD-10-CM | POA: Diagnosis not present

## 2022-12-31 DIAGNOSIS — Z79899 Other long term (current) drug therapy: Secondary | ICD-10-CM | POA: Diagnosis not present

## 2022-12-31 DIAGNOSIS — N2581 Secondary hyperparathyroidism of renal origin: Secondary | ICD-10-CM | POA: Diagnosis not present

## 2022-12-31 DIAGNOSIS — E440 Moderate protein-calorie malnutrition: Secondary | ICD-10-CM | POA: Diagnosis not present

## 2023-01-01 DIAGNOSIS — N186 End stage renal disease: Secondary | ICD-10-CM | POA: Diagnosis not present

## 2023-01-01 DIAGNOSIS — Z992 Dependence on renal dialysis: Secondary | ICD-10-CM | POA: Diagnosis not present

## 2023-01-01 DIAGNOSIS — N049 Nephrotic syndrome with unspecified morphologic changes: Secondary | ICD-10-CM | POA: Diagnosis not present

## 2023-01-03 DIAGNOSIS — D509 Iron deficiency anemia, unspecified: Secondary | ICD-10-CM | POA: Diagnosis not present

## 2023-01-03 DIAGNOSIS — Z79899 Other long term (current) drug therapy: Secondary | ICD-10-CM | POA: Diagnosis not present

## 2023-01-03 DIAGNOSIS — R82998 Other abnormal findings in urine: Secondary | ICD-10-CM | POA: Diagnosis not present

## 2023-01-03 DIAGNOSIS — N186 End stage renal disease: Secondary | ICD-10-CM | POA: Diagnosis not present

## 2023-01-03 DIAGNOSIS — N2581 Secondary hyperparathyroidism of renal origin: Secondary | ICD-10-CM | POA: Diagnosis not present

## 2023-01-03 DIAGNOSIS — Z992 Dependence on renal dialysis: Secondary | ICD-10-CM | POA: Diagnosis not present

## 2023-01-03 DIAGNOSIS — E875 Hyperkalemia: Secondary | ICD-10-CM | POA: Diagnosis not present

## 2023-01-03 DIAGNOSIS — D631 Anemia in chronic kidney disease: Secondary | ICD-10-CM | POA: Diagnosis not present

## 2023-01-03 DIAGNOSIS — E440 Moderate protein-calorie malnutrition: Secondary | ICD-10-CM | POA: Diagnosis not present

## 2023-01-03 DIAGNOSIS — R17 Unspecified jaundice: Secondary | ICD-10-CM | POA: Diagnosis not present

## 2023-01-04 DIAGNOSIS — E440 Moderate protein-calorie malnutrition: Secondary | ICD-10-CM | POA: Diagnosis not present

## 2023-01-04 DIAGNOSIS — R82998 Other abnormal findings in urine: Secondary | ICD-10-CM | POA: Diagnosis not present

## 2023-01-04 DIAGNOSIS — R17 Unspecified jaundice: Secondary | ICD-10-CM | POA: Diagnosis not present

## 2023-01-04 DIAGNOSIS — D631 Anemia in chronic kidney disease: Secondary | ICD-10-CM | POA: Diagnosis not present

## 2023-01-04 DIAGNOSIS — Z79899 Other long term (current) drug therapy: Secondary | ICD-10-CM | POA: Diagnosis not present

## 2023-01-04 DIAGNOSIS — N186 End stage renal disease: Secondary | ICD-10-CM | POA: Diagnosis not present

## 2023-01-04 DIAGNOSIS — N2581 Secondary hyperparathyroidism of renal origin: Secondary | ICD-10-CM | POA: Diagnosis not present

## 2023-01-04 DIAGNOSIS — D509 Iron deficiency anemia, unspecified: Secondary | ICD-10-CM | POA: Diagnosis not present

## 2023-01-04 DIAGNOSIS — Z992 Dependence on renal dialysis: Secondary | ICD-10-CM | POA: Diagnosis not present

## 2023-01-04 DIAGNOSIS — E875 Hyperkalemia: Secondary | ICD-10-CM | POA: Diagnosis not present

## 2023-01-06 DIAGNOSIS — Z79899 Other long term (current) drug therapy: Secondary | ICD-10-CM | POA: Diagnosis not present

## 2023-01-06 DIAGNOSIS — N2581 Secondary hyperparathyroidism of renal origin: Secondary | ICD-10-CM | POA: Diagnosis not present

## 2023-01-06 DIAGNOSIS — R17 Unspecified jaundice: Secondary | ICD-10-CM | POA: Diagnosis not present

## 2023-01-06 DIAGNOSIS — D631 Anemia in chronic kidney disease: Secondary | ICD-10-CM | POA: Diagnosis not present

## 2023-01-06 DIAGNOSIS — E875 Hyperkalemia: Secondary | ICD-10-CM | POA: Diagnosis not present

## 2023-01-06 DIAGNOSIS — N186 End stage renal disease: Secondary | ICD-10-CM | POA: Diagnosis not present

## 2023-01-06 DIAGNOSIS — D509 Iron deficiency anemia, unspecified: Secondary | ICD-10-CM | POA: Diagnosis not present

## 2023-01-06 DIAGNOSIS — R82998 Other abnormal findings in urine: Secondary | ICD-10-CM | POA: Diagnosis not present

## 2023-01-06 DIAGNOSIS — Z992 Dependence on renal dialysis: Secondary | ICD-10-CM | POA: Diagnosis not present

## 2023-01-06 DIAGNOSIS — E440 Moderate protein-calorie malnutrition: Secondary | ICD-10-CM | POA: Diagnosis not present

## 2023-01-08 DIAGNOSIS — N186 End stage renal disease: Secondary | ICD-10-CM | POA: Diagnosis not present

## 2023-01-08 DIAGNOSIS — Z992 Dependence on renal dialysis: Secondary | ICD-10-CM | POA: Diagnosis not present

## 2023-01-08 DIAGNOSIS — E875 Hyperkalemia: Secondary | ICD-10-CM | POA: Diagnosis not present

## 2023-01-08 DIAGNOSIS — R17 Unspecified jaundice: Secondary | ICD-10-CM | POA: Diagnosis not present

## 2023-01-08 DIAGNOSIS — R82998 Other abnormal findings in urine: Secondary | ICD-10-CM | POA: Diagnosis not present

## 2023-01-08 DIAGNOSIS — D509 Iron deficiency anemia, unspecified: Secondary | ICD-10-CM | POA: Diagnosis not present

## 2023-01-08 DIAGNOSIS — Z79899 Other long term (current) drug therapy: Secondary | ICD-10-CM | POA: Diagnosis not present

## 2023-01-08 DIAGNOSIS — D631 Anemia in chronic kidney disease: Secondary | ICD-10-CM | POA: Diagnosis not present

## 2023-01-08 DIAGNOSIS — E440 Moderate protein-calorie malnutrition: Secondary | ICD-10-CM | POA: Diagnosis not present

## 2023-01-08 DIAGNOSIS — N2581 Secondary hyperparathyroidism of renal origin: Secondary | ICD-10-CM | POA: Diagnosis not present

## 2023-01-09 DIAGNOSIS — R17 Unspecified jaundice: Secondary | ICD-10-CM | POA: Diagnosis not present

## 2023-01-09 DIAGNOSIS — E875 Hyperkalemia: Secondary | ICD-10-CM | POA: Diagnosis not present

## 2023-01-09 DIAGNOSIS — Z992 Dependence on renal dialysis: Secondary | ICD-10-CM | POA: Diagnosis not present

## 2023-01-09 DIAGNOSIS — N2581 Secondary hyperparathyroidism of renal origin: Secondary | ICD-10-CM | POA: Diagnosis not present

## 2023-01-09 DIAGNOSIS — N186 End stage renal disease: Secondary | ICD-10-CM | POA: Diagnosis not present

## 2023-01-09 DIAGNOSIS — R82998 Other abnormal findings in urine: Secondary | ICD-10-CM | POA: Diagnosis not present

## 2023-01-09 DIAGNOSIS — E440 Moderate protein-calorie malnutrition: Secondary | ICD-10-CM | POA: Diagnosis not present

## 2023-01-09 DIAGNOSIS — Z79899 Other long term (current) drug therapy: Secondary | ICD-10-CM | POA: Diagnosis not present

## 2023-01-09 DIAGNOSIS — D631 Anemia in chronic kidney disease: Secondary | ICD-10-CM | POA: Diagnosis not present

## 2023-01-09 DIAGNOSIS — D509 Iron deficiency anemia, unspecified: Secondary | ICD-10-CM | POA: Diagnosis not present

## 2023-01-12 DIAGNOSIS — N2581 Secondary hyperparathyroidism of renal origin: Secondary | ICD-10-CM | POA: Diagnosis not present

## 2023-01-12 DIAGNOSIS — R82998 Other abnormal findings in urine: Secondary | ICD-10-CM | POA: Diagnosis not present

## 2023-01-12 DIAGNOSIS — E440 Moderate protein-calorie malnutrition: Secondary | ICD-10-CM | POA: Diagnosis not present

## 2023-01-12 DIAGNOSIS — E875 Hyperkalemia: Secondary | ICD-10-CM | POA: Diagnosis not present

## 2023-01-12 DIAGNOSIS — R17 Unspecified jaundice: Secondary | ICD-10-CM | POA: Diagnosis not present

## 2023-01-12 DIAGNOSIS — Z992 Dependence on renal dialysis: Secondary | ICD-10-CM | POA: Diagnosis not present

## 2023-01-12 DIAGNOSIS — Z79899 Other long term (current) drug therapy: Secondary | ICD-10-CM | POA: Diagnosis not present

## 2023-01-12 DIAGNOSIS — D631 Anemia in chronic kidney disease: Secondary | ICD-10-CM | POA: Diagnosis not present

## 2023-01-12 DIAGNOSIS — D509 Iron deficiency anemia, unspecified: Secondary | ICD-10-CM | POA: Diagnosis not present

## 2023-01-12 DIAGNOSIS — N186 End stage renal disease: Secondary | ICD-10-CM | POA: Diagnosis not present

## 2023-01-13 DIAGNOSIS — Z992 Dependence on renal dialysis: Secondary | ICD-10-CM | POA: Diagnosis not present

## 2023-01-13 DIAGNOSIS — D509 Iron deficiency anemia, unspecified: Secondary | ICD-10-CM | POA: Diagnosis not present

## 2023-01-13 DIAGNOSIS — R17 Unspecified jaundice: Secondary | ICD-10-CM | POA: Diagnosis not present

## 2023-01-13 DIAGNOSIS — D631 Anemia in chronic kidney disease: Secondary | ICD-10-CM | POA: Diagnosis not present

## 2023-01-13 DIAGNOSIS — Z79899 Other long term (current) drug therapy: Secondary | ICD-10-CM | POA: Diagnosis not present

## 2023-01-13 DIAGNOSIS — N186 End stage renal disease: Secondary | ICD-10-CM | POA: Diagnosis not present

## 2023-01-13 DIAGNOSIS — E440 Moderate protein-calorie malnutrition: Secondary | ICD-10-CM | POA: Diagnosis not present

## 2023-01-13 DIAGNOSIS — E875 Hyperkalemia: Secondary | ICD-10-CM | POA: Diagnosis not present

## 2023-01-13 DIAGNOSIS — R82998 Other abnormal findings in urine: Secondary | ICD-10-CM | POA: Diagnosis not present

## 2023-01-13 DIAGNOSIS — N2581 Secondary hyperparathyroidism of renal origin: Secondary | ICD-10-CM | POA: Diagnosis not present

## 2023-01-15 DIAGNOSIS — R17 Unspecified jaundice: Secondary | ICD-10-CM | POA: Diagnosis not present

## 2023-01-15 DIAGNOSIS — Z79899 Other long term (current) drug therapy: Secondary | ICD-10-CM | POA: Diagnosis not present

## 2023-01-15 DIAGNOSIS — D631 Anemia in chronic kidney disease: Secondary | ICD-10-CM | POA: Diagnosis not present

## 2023-01-15 DIAGNOSIS — R82998 Other abnormal findings in urine: Secondary | ICD-10-CM | POA: Diagnosis not present

## 2023-01-15 DIAGNOSIS — N186 End stage renal disease: Secondary | ICD-10-CM | POA: Diagnosis not present

## 2023-01-15 DIAGNOSIS — N2581 Secondary hyperparathyroidism of renal origin: Secondary | ICD-10-CM | POA: Diagnosis not present

## 2023-01-15 DIAGNOSIS — E440 Moderate protein-calorie malnutrition: Secondary | ICD-10-CM | POA: Diagnosis not present

## 2023-01-15 DIAGNOSIS — D509 Iron deficiency anemia, unspecified: Secondary | ICD-10-CM | POA: Diagnosis not present

## 2023-01-15 DIAGNOSIS — Z992 Dependence on renal dialysis: Secondary | ICD-10-CM | POA: Diagnosis not present

## 2023-01-15 DIAGNOSIS — E875 Hyperkalemia: Secondary | ICD-10-CM | POA: Diagnosis not present

## 2023-01-16 DIAGNOSIS — R82998 Other abnormal findings in urine: Secondary | ICD-10-CM | POA: Diagnosis not present

## 2023-01-16 DIAGNOSIS — Z992 Dependence on renal dialysis: Secondary | ICD-10-CM | POA: Diagnosis not present

## 2023-01-16 DIAGNOSIS — N2581 Secondary hyperparathyroidism of renal origin: Secondary | ICD-10-CM | POA: Diagnosis not present

## 2023-01-16 DIAGNOSIS — R17 Unspecified jaundice: Secondary | ICD-10-CM | POA: Diagnosis not present

## 2023-01-16 DIAGNOSIS — N186 End stage renal disease: Secondary | ICD-10-CM | POA: Diagnosis not present

## 2023-01-16 DIAGNOSIS — E875 Hyperkalemia: Secondary | ICD-10-CM | POA: Diagnosis not present

## 2023-01-16 DIAGNOSIS — E440 Moderate protein-calorie malnutrition: Secondary | ICD-10-CM | POA: Diagnosis not present

## 2023-01-16 DIAGNOSIS — Z79899 Other long term (current) drug therapy: Secondary | ICD-10-CM | POA: Diagnosis not present

## 2023-01-16 DIAGNOSIS — D631 Anemia in chronic kidney disease: Secondary | ICD-10-CM | POA: Diagnosis not present

## 2023-01-16 DIAGNOSIS — D509 Iron deficiency anemia, unspecified: Secondary | ICD-10-CM | POA: Diagnosis not present

## 2023-01-20 ENCOUNTER — Other Ambulatory Visit: Payer: Self-pay | Admitting: Internal Medicine

## 2023-01-20 DIAGNOSIS — E875 Hyperkalemia: Secondary | ICD-10-CM | POA: Diagnosis not present

## 2023-01-20 DIAGNOSIS — Z992 Dependence on renal dialysis: Secondary | ICD-10-CM | POA: Diagnosis not present

## 2023-01-20 DIAGNOSIS — Z79899 Other long term (current) drug therapy: Secondary | ICD-10-CM | POA: Diagnosis not present

## 2023-01-20 DIAGNOSIS — R17 Unspecified jaundice: Secondary | ICD-10-CM | POA: Diagnosis not present

## 2023-01-20 DIAGNOSIS — D509 Iron deficiency anemia, unspecified: Secondary | ICD-10-CM | POA: Diagnosis not present

## 2023-01-20 DIAGNOSIS — I10 Essential (primary) hypertension: Secondary | ICD-10-CM

## 2023-01-20 DIAGNOSIS — D631 Anemia in chronic kidney disease: Secondary | ICD-10-CM | POA: Diagnosis not present

## 2023-01-20 DIAGNOSIS — N186 End stage renal disease: Secondary | ICD-10-CM | POA: Diagnosis not present

## 2023-01-20 DIAGNOSIS — N2581 Secondary hyperparathyroidism of renal origin: Secondary | ICD-10-CM | POA: Diagnosis not present

## 2023-01-20 DIAGNOSIS — R82998 Other abnormal findings in urine: Secondary | ICD-10-CM | POA: Diagnosis not present

## 2023-01-20 DIAGNOSIS — E440 Moderate protein-calorie malnutrition: Secondary | ICD-10-CM | POA: Diagnosis not present

## 2023-01-21 DIAGNOSIS — E875 Hyperkalemia: Secondary | ICD-10-CM | POA: Diagnosis not present

## 2023-01-21 DIAGNOSIS — R17 Unspecified jaundice: Secondary | ICD-10-CM | POA: Diagnosis not present

## 2023-01-21 DIAGNOSIS — Z992 Dependence on renal dialysis: Secondary | ICD-10-CM | POA: Diagnosis not present

## 2023-01-21 DIAGNOSIS — Z79899 Other long term (current) drug therapy: Secondary | ICD-10-CM | POA: Diagnosis not present

## 2023-01-21 DIAGNOSIS — D509 Iron deficiency anemia, unspecified: Secondary | ICD-10-CM | POA: Diagnosis not present

## 2023-01-21 DIAGNOSIS — R82998 Other abnormal findings in urine: Secondary | ICD-10-CM | POA: Diagnosis not present

## 2023-01-21 DIAGNOSIS — N2581 Secondary hyperparathyroidism of renal origin: Secondary | ICD-10-CM | POA: Diagnosis not present

## 2023-01-21 DIAGNOSIS — N186 End stage renal disease: Secondary | ICD-10-CM | POA: Diagnosis not present

## 2023-01-21 DIAGNOSIS — D631 Anemia in chronic kidney disease: Secondary | ICD-10-CM | POA: Diagnosis not present

## 2023-01-21 DIAGNOSIS — E440 Moderate protein-calorie malnutrition: Secondary | ICD-10-CM | POA: Diagnosis not present

## 2023-01-24 DIAGNOSIS — N186 End stage renal disease: Secondary | ICD-10-CM | POA: Diagnosis not present

## 2023-01-24 DIAGNOSIS — D631 Anemia in chronic kidney disease: Secondary | ICD-10-CM | POA: Diagnosis not present

## 2023-01-24 DIAGNOSIS — R17 Unspecified jaundice: Secondary | ICD-10-CM | POA: Diagnosis not present

## 2023-01-24 DIAGNOSIS — D509 Iron deficiency anemia, unspecified: Secondary | ICD-10-CM | POA: Diagnosis not present

## 2023-01-24 DIAGNOSIS — N2581 Secondary hyperparathyroidism of renal origin: Secondary | ICD-10-CM | POA: Diagnosis not present

## 2023-01-24 DIAGNOSIS — R82998 Other abnormal findings in urine: Secondary | ICD-10-CM | POA: Diagnosis not present

## 2023-01-24 DIAGNOSIS — Z79899 Other long term (current) drug therapy: Secondary | ICD-10-CM | POA: Diagnosis not present

## 2023-01-24 DIAGNOSIS — Z992 Dependence on renal dialysis: Secondary | ICD-10-CM | POA: Diagnosis not present

## 2023-01-24 DIAGNOSIS — E875 Hyperkalemia: Secondary | ICD-10-CM | POA: Diagnosis not present

## 2023-01-24 DIAGNOSIS — E440 Moderate protein-calorie malnutrition: Secondary | ICD-10-CM | POA: Diagnosis not present

## 2023-01-25 DIAGNOSIS — Z992 Dependence on renal dialysis: Secondary | ICD-10-CM | POA: Diagnosis not present

## 2023-01-25 DIAGNOSIS — E440 Moderate protein-calorie malnutrition: Secondary | ICD-10-CM | POA: Diagnosis not present

## 2023-01-25 DIAGNOSIS — R82998 Other abnormal findings in urine: Secondary | ICD-10-CM | POA: Diagnosis not present

## 2023-01-25 DIAGNOSIS — Z79899 Other long term (current) drug therapy: Secondary | ICD-10-CM | POA: Diagnosis not present

## 2023-01-25 DIAGNOSIS — D631 Anemia in chronic kidney disease: Secondary | ICD-10-CM | POA: Diagnosis not present

## 2023-01-25 DIAGNOSIS — N186 End stage renal disease: Secondary | ICD-10-CM | POA: Diagnosis not present

## 2023-01-25 DIAGNOSIS — E875 Hyperkalemia: Secondary | ICD-10-CM | POA: Diagnosis not present

## 2023-01-25 DIAGNOSIS — D509 Iron deficiency anemia, unspecified: Secondary | ICD-10-CM | POA: Diagnosis not present

## 2023-01-25 DIAGNOSIS — R17 Unspecified jaundice: Secondary | ICD-10-CM | POA: Diagnosis not present

## 2023-01-25 DIAGNOSIS — N2581 Secondary hyperparathyroidism of renal origin: Secondary | ICD-10-CM | POA: Diagnosis not present

## 2023-01-26 DIAGNOSIS — Z79899 Other long term (current) drug therapy: Secondary | ICD-10-CM | POA: Diagnosis not present

## 2023-01-26 DIAGNOSIS — N2581 Secondary hyperparathyroidism of renal origin: Secondary | ICD-10-CM | POA: Diagnosis not present

## 2023-01-26 DIAGNOSIS — D509 Iron deficiency anemia, unspecified: Secondary | ICD-10-CM | POA: Diagnosis not present

## 2023-01-26 DIAGNOSIS — R82998 Other abnormal findings in urine: Secondary | ICD-10-CM | POA: Diagnosis not present

## 2023-01-26 DIAGNOSIS — D631 Anemia in chronic kidney disease: Secondary | ICD-10-CM | POA: Diagnosis not present

## 2023-01-26 DIAGNOSIS — E875 Hyperkalemia: Secondary | ICD-10-CM | POA: Diagnosis not present

## 2023-01-26 DIAGNOSIS — R17 Unspecified jaundice: Secondary | ICD-10-CM | POA: Diagnosis not present

## 2023-01-26 DIAGNOSIS — N186 End stage renal disease: Secondary | ICD-10-CM | POA: Diagnosis not present

## 2023-01-26 DIAGNOSIS — Z992 Dependence on renal dialysis: Secondary | ICD-10-CM | POA: Diagnosis not present

## 2023-01-26 DIAGNOSIS — E440 Moderate protein-calorie malnutrition: Secondary | ICD-10-CM | POA: Diagnosis not present

## 2023-01-28 DIAGNOSIS — R17 Unspecified jaundice: Secondary | ICD-10-CM | POA: Diagnosis not present

## 2023-01-28 DIAGNOSIS — Z992 Dependence on renal dialysis: Secondary | ICD-10-CM | POA: Diagnosis not present

## 2023-01-28 DIAGNOSIS — E875 Hyperkalemia: Secondary | ICD-10-CM | POA: Diagnosis not present

## 2023-01-28 DIAGNOSIS — R82998 Other abnormal findings in urine: Secondary | ICD-10-CM | POA: Diagnosis not present

## 2023-01-28 DIAGNOSIS — E440 Moderate protein-calorie malnutrition: Secondary | ICD-10-CM | POA: Diagnosis not present

## 2023-01-28 DIAGNOSIS — D509 Iron deficiency anemia, unspecified: Secondary | ICD-10-CM | POA: Diagnosis not present

## 2023-01-28 DIAGNOSIS — N2581 Secondary hyperparathyroidism of renal origin: Secondary | ICD-10-CM | POA: Diagnosis not present

## 2023-01-28 DIAGNOSIS — Z79899 Other long term (current) drug therapy: Secondary | ICD-10-CM | POA: Diagnosis not present

## 2023-01-28 DIAGNOSIS — D631 Anemia in chronic kidney disease: Secondary | ICD-10-CM | POA: Diagnosis not present

## 2023-01-28 DIAGNOSIS — N186 End stage renal disease: Secondary | ICD-10-CM | POA: Diagnosis not present

## 2023-01-31 DIAGNOSIS — E440 Moderate protein-calorie malnutrition: Secondary | ICD-10-CM | POA: Diagnosis not present

## 2023-01-31 DIAGNOSIS — D509 Iron deficiency anemia, unspecified: Secondary | ICD-10-CM | POA: Diagnosis not present

## 2023-01-31 DIAGNOSIS — E875 Hyperkalemia: Secondary | ICD-10-CM | POA: Diagnosis not present

## 2023-01-31 DIAGNOSIS — Z79899 Other long term (current) drug therapy: Secondary | ICD-10-CM | POA: Diagnosis not present

## 2023-01-31 DIAGNOSIS — N049 Nephrotic syndrome with unspecified morphologic changes: Secondary | ICD-10-CM | POA: Diagnosis not present

## 2023-01-31 DIAGNOSIS — N186 End stage renal disease: Secondary | ICD-10-CM | POA: Diagnosis not present

## 2023-01-31 DIAGNOSIS — D631 Anemia in chronic kidney disease: Secondary | ICD-10-CM | POA: Diagnosis not present

## 2023-01-31 DIAGNOSIS — R17 Unspecified jaundice: Secondary | ICD-10-CM | POA: Diagnosis not present

## 2023-01-31 DIAGNOSIS — N2581 Secondary hyperparathyroidism of renal origin: Secondary | ICD-10-CM | POA: Diagnosis not present

## 2023-01-31 DIAGNOSIS — R82998 Other abnormal findings in urine: Secondary | ICD-10-CM | POA: Diagnosis not present

## 2023-01-31 DIAGNOSIS — Z992 Dependence on renal dialysis: Secondary | ICD-10-CM | POA: Diagnosis not present

## 2023-02-03 DIAGNOSIS — N2589 Other disorders resulting from impaired renal tubular function: Secondary | ICD-10-CM | POA: Diagnosis not present

## 2023-02-03 DIAGNOSIS — R82998 Other abnormal findings in urine: Secondary | ICD-10-CM | POA: Diagnosis not present

## 2023-02-03 DIAGNOSIS — N186 End stage renal disease: Secondary | ICD-10-CM | POA: Diagnosis not present

## 2023-02-03 DIAGNOSIS — D631 Anemia in chronic kidney disease: Secondary | ICD-10-CM | POA: Diagnosis not present

## 2023-02-03 DIAGNOSIS — Z992 Dependence on renal dialysis: Secondary | ICD-10-CM | POA: Diagnosis not present

## 2023-02-03 DIAGNOSIS — Z23 Encounter for immunization: Secondary | ICD-10-CM | POA: Diagnosis not present

## 2023-02-03 DIAGNOSIS — N2581 Secondary hyperparathyroidism of renal origin: Secondary | ICD-10-CM | POA: Diagnosis not present

## 2023-02-03 DIAGNOSIS — D509 Iron deficiency anemia, unspecified: Secondary | ICD-10-CM | POA: Diagnosis not present

## 2023-02-03 DIAGNOSIS — E875 Hyperkalemia: Secondary | ICD-10-CM | POA: Diagnosis not present

## 2023-02-05 DIAGNOSIS — D509 Iron deficiency anemia, unspecified: Secondary | ICD-10-CM | POA: Diagnosis not present

## 2023-02-05 DIAGNOSIS — N2581 Secondary hyperparathyroidism of renal origin: Secondary | ICD-10-CM | POA: Diagnosis not present

## 2023-02-05 DIAGNOSIS — N2589 Other disorders resulting from impaired renal tubular function: Secondary | ICD-10-CM | POA: Diagnosis not present

## 2023-02-05 DIAGNOSIS — Z23 Encounter for immunization: Secondary | ICD-10-CM | POA: Diagnosis not present

## 2023-02-05 DIAGNOSIS — E875 Hyperkalemia: Secondary | ICD-10-CM | POA: Diagnosis not present

## 2023-02-05 DIAGNOSIS — R82998 Other abnormal findings in urine: Secondary | ICD-10-CM | POA: Diagnosis not present

## 2023-02-05 DIAGNOSIS — N186 End stage renal disease: Secondary | ICD-10-CM | POA: Diagnosis not present

## 2023-02-05 DIAGNOSIS — D631 Anemia in chronic kidney disease: Secondary | ICD-10-CM | POA: Diagnosis not present

## 2023-02-05 DIAGNOSIS — Z992 Dependence on renal dialysis: Secondary | ICD-10-CM | POA: Diagnosis not present

## 2023-02-06 DIAGNOSIS — R82998 Other abnormal findings in urine: Secondary | ICD-10-CM | POA: Diagnosis not present

## 2023-02-06 DIAGNOSIS — N2581 Secondary hyperparathyroidism of renal origin: Secondary | ICD-10-CM | POA: Diagnosis not present

## 2023-02-06 DIAGNOSIS — N2589 Other disorders resulting from impaired renal tubular function: Secondary | ICD-10-CM | POA: Diagnosis not present

## 2023-02-06 DIAGNOSIS — D631 Anemia in chronic kidney disease: Secondary | ICD-10-CM | POA: Diagnosis not present

## 2023-02-06 DIAGNOSIS — N186 End stage renal disease: Secondary | ICD-10-CM | POA: Diagnosis not present

## 2023-02-06 DIAGNOSIS — E875 Hyperkalemia: Secondary | ICD-10-CM | POA: Diagnosis not present

## 2023-02-06 DIAGNOSIS — Z992 Dependence on renal dialysis: Secondary | ICD-10-CM | POA: Diagnosis not present

## 2023-02-06 DIAGNOSIS — Z23 Encounter for immunization: Secondary | ICD-10-CM | POA: Diagnosis not present

## 2023-02-06 DIAGNOSIS — D509 Iron deficiency anemia, unspecified: Secondary | ICD-10-CM | POA: Diagnosis not present

## 2023-02-08 DIAGNOSIS — D631 Anemia in chronic kidney disease: Secondary | ICD-10-CM | POA: Diagnosis not present

## 2023-02-08 DIAGNOSIS — Z23 Encounter for immunization: Secondary | ICD-10-CM | POA: Diagnosis not present

## 2023-02-08 DIAGNOSIS — N2581 Secondary hyperparathyroidism of renal origin: Secondary | ICD-10-CM | POA: Diagnosis not present

## 2023-02-08 DIAGNOSIS — E875 Hyperkalemia: Secondary | ICD-10-CM | POA: Diagnosis not present

## 2023-02-08 DIAGNOSIS — D509 Iron deficiency anemia, unspecified: Secondary | ICD-10-CM | POA: Diagnosis not present

## 2023-02-08 DIAGNOSIS — R82998 Other abnormal findings in urine: Secondary | ICD-10-CM | POA: Diagnosis not present

## 2023-02-08 DIAGNOSIS — N186 End stage renal disease: Secondary | ICD-10-CM | POA: Diagnosis not present

## 2023-02-08 DIAGNOSIS — N2589 Other disorders resulting from impaired renal tubular function: Secondary | ICD-10-CM | POA: Diagnosis not present

## 2023-02-08 DIAGNOSIS — Z992 Dependence on renal dialysis: Secondary | ICD-10-CM | POA: Diagnosis not present

## 2023-02-09 DIAGNOSIS — E875 Hyperkalemia: Secondary | ICD-10-CM | POA: Diagnosis not present

## 2023-02-09 DIAGNOSIS — D509 Iron deficiency anemia, unspecified: Secondary | ICD-10-CM | POA: Diagnosis not present

## 2023-02-09 DIAGNOSIS — Z23 Encounter for immunization: Secondary | ICD-10-CM | POA: Diagnosis not present

## 2023-02-09 DIAGNOSIS — R82998 Other abnormal findings in urine: Secondary | ICD-10-CM | POA: Diagnosis not present

## 2023-02-09 DIAGNOSIS — D631 Anemia in chronic kidney disease: Secondary | ICD-10-CM | POA: Diagnosis not present

## 2023-02-09 DIAGNOSIS — Z992 Dependence on renal dialysis: Secondary | ICD-10-CM | POA: Diagnosis not present

## 2023-02-09 DIAGNOSIS — N186 End stage renal disease: Secondary | ICD-10-CM | POA: Diagnosis not present

## 2023-02-09 DIAGNOSIS — N2581 Secondary hyperparathyroidism of renal origin: Secondary | ICD-10-CM | POA: Diagnosis not present

## 2023-02-09 DIAGNOSIS — N2589 Other disorders resulting from impaired renal tubular function: Secondary | ICD-10-CM | POA: Diagnosis not present

## 2023-02-10 DIAGNOSIS — R82998 Other abnormal findings in urine: Secondary | ICD-10-CM | POA: Diagnosis not present

## 2023-02-10 DIAGNOSIS — N2589 Other disorders resulting from impaired renal tubular function: Secondary | ICD-10-CM | POA: Diagnosis not present

## 2023-02-10 DIAGNOSIS — N2581 Secondary hyperparathyroidism of renal origin: Secondary | ICD-10-CM | POA: Diagnosis not present

## 2023-02-10 DIAGNOSIS — N186 End stage renal disease: Secondary | ICD-10-CM | POA: Diagnosis not present

## 2023-02-10 DIAGNOSIS — Z23 Encounter for immunization: Secondary | ICD-10-CM | POA: Diagnosis not present

## 2023-02-10 DIAGNOSIS — D631 Anemia in chronic kidney disease: Secondary | ICD-10-CM | POA: Diagnosis not present

## 2023-02-10 DIAGNOSIS — Z992 Dependence on renal dialysis: Secondary | ICD-10-CM | POA: Diagnosis not present

## 2023-02-10 DIAGNOSIS — D509 Iron deficiency anemia, unspecified: Secondary | ICD-10-CM | POA: Diagnosis not present

## 2023-02-10 DIAGNOSIS — E875 Hyperkalemia: Secondary | ICD-10-CM | POA: Diagnosis not present

## 2023-02-12 DIAGNOSIS — Z992 Dependence on renal dialysis: Secondary | ICD-10-CM | POA: Diagnosis not present

## 2023-02-12 DIAGNOSIS — N2581 Secondary hyperparathyroidism of renal origin: Secondary | ICD-10-CM | POA: Diagnosis not present

## 2023-02-12 DIAGNOSIS — D509 Iron deficiency anemia, unspecified: Secondary | ICD-10-CM | POA: Diagnosis not present

## 2023-02-12 DIAGNOSIS — N2589 Other disorders resulting from impaired renal tubular function: Secondary | ICD-10-CM | POA: Diagnosis not present

## 2023-02-12 DIAGNOSIS — R82998 Other abnormal findings in urine: Secondary | ICD-10-CM | POA: Diagnosis not present

## 2023-02-12 DIAGNOSIS — Z23 Encounter for immunization: Secondary | ICD-10-CM | POA: Diagnosis not present

## 2023-02-12 DIAGNOSIS — E875 Hyperkalemia: Secondary | ICD-10-CM | POA: Diagnosis not present

## 2023-02-12 DIAGNOSIS — D631 Anemia in chronic kidney disease: Secondary | ICD-10-CM | POA: Diagnosis not present

## 2023-02-12 DIAGNOSIS — N186 End stage renal disease: Secondary | ICD-10-CM | POA: Diagnosis not present

## 2023-02-13 DIAGNOSIS — E875 Hyperkalemia: Secondary | ICD-10-CM | POA: Diagnosis not present

## 2023-02-13 DIAGNOSIS — Z23 Encounter for immunization: Secondary | ICD-10-CM | POA: Diagnosis not present

## 2023-02-13 DIAGNOSIS — N186 End stage renal disease: Secondary | ICD-10-CM | POA: Diagnosis not present

## 2023-02-13 DIAGNOSIS — D509 Iron deficiency anemia, unspecified: Secondary | ICD-10-CM | POA: Diagnosis not present

## 2023-02-13 DIAGNOSIS — N2589 Other disorders resulting from impaired renal tubular function: Secondary | ICD-10-CM | POA: Diagnosis not present

## 2023-02-13 DIAGNOSIS — N2581 Secondary hyperparathyroidism of renal origin: Secondary | ICD-10-CM | POA: Diagnosis not present

## 2023-02-13 DIAGNOSIS — D631 Anemia in chronic kidney disease: Secondary | ICD-10-CM | POA: Diagnosis not present

## 2023-02-13 DIAGNOSIS — Z992 Dependence on renal dialysis: Secondary | ICD-10-CM | POA: Diagnosis not present

## 2023-02-13 DIAGNOSIS — R82998 Other abnormal findings in urine: Secondary | ICD-10-CM | POA: Diagnosis not present

## 2023-02-14 DIAGNOSIS — D509 Iron deficiency anemia, unspecified: Secondary | ICD-10-CM | POA: Diagnosis not present

## 2023-02-14 DIAGNOSIS — R82998 Other abnormal findings in urine: Secondary | ICD-10-CM | POA: Diagnosis not present

## 2023-02-14 DIAGNOSIS — Z23 Encounter for immunization: Secondary | ICD-10-CM | POA: Diagnosis not present

## 2023-02-14 DIAGNOSIS — N186 End stage renal disease: Secondary | ICD-10-CM | POA: Diagnosis not present

## 2023-02-14 DIAGNOSIS — E875 Hyperkalemia: Secondary | ICD-10-CM | POA: Diagnosis not present

## 2023-02-14 DIAGNOSIS — Z992 Dependence on renal dialysis: Secondary | ICD-10-CM | POA: Diagnosis not present

## 2023-02-14 DIAGNOSIS — D631 Anemia in chronic kidney disease: Secondary | ICD-10-CM | POA: Diagnosis not present

## 2023-02-14 DIAGNOSIS — N2581 Secondary hyperparathyroidism of renal origin: Secondary | ICD-10-CM | POA: Diagnosis not present

## 2023-02-14 DIAGNOSIS — N2589 Other disorders resulting from impaired renal tubular function: Secondary | ICD-10-CM | POA: Diagnosis not present

## 2023-02-17 DIAGNOSIS — N2581 Secondary hyperparathyroidism of renal origin: Secondary | ICD-10-CM | POA: Diagnosis not present

## 2023-02-17 DIAGNOSIS — R82998 Other abnormal findings in urine: Secondary | ICD-10-CM | POA: Diagnosis not present

## 2023-02-17 DIAGNOSIS — D631 Anemia in chronic kidney disease: Secondary | ICD-10-CM | POA: Diagnosis not present

## 2023-02-17 DIAGNOSIS — D509 Iron deficiency anemia, unspecified: Secondary | ICD-10-CM | POA: Diagnosis not present

## 2023-02-17 DIAGNOSIS — E875 Hyperkalemia: Secondary | ICD-10-CM | POA: Diagnosis not present

## 2023-02-17 DIAGNOSIS — Z992 Dependence on renal dialysis: Secondary | ICD-10-CM | POA: Diagnosis not present

## 2023-02-17 DIAGNOSIS — N2589 Other disorders resulting from impaired renal tubular function: Secondary | ICD-10-CM | POA: Diagnosis not present

## 2023-02-17 DIAGNOSIS — N186 End stage renal disease: Secondary | ICD-10-CM | POA: Diagnosis not present

## 2023-02-17 DIAGNOSIS — Z23 Encounter for immunization: Secondary | ICD-10-CM | POA: Diagnosis not present

## 2023-02-19 DIAGNOSIS — E875 Hyperkalemia: Secondary | ICD-10-CM | POA: Diagnosis not present

## 2023-02-19 DIAGNOSIS — D509 Iron deficiency anemia, unspecified: Secondary | ICD-10-CM | POA: Diagnosis not present

## 2023-02-19 DIAGNOSIS — N186 End stage renal disease: Secondary | ICD-10-CM | POA: Diagnosis not present

## 2023-02-19 DIAGNOSIS — R82998 Other abnormal findings in urine: Secondary | ICD-10-CM | POA: Diagnosis not present

## 2023-02-19 DIAGNOSIS — N2581 Secondary hyperparathyroidism of renal origin: Secondary | ICD-10-CM | POA: Diagnosis not present

## 2023-02-19 DIAGNOSIS — N2589 Other disorders resulting from impaired renal tubular function: Secondary | ICD-10-CM | POA: Diagnosis not present

## 2023-02-19 DIAGNOSIS — Z992 Dependence on renal dialysis: Secondary | ICD-10-CM | POA: Diagnosis not present

## 2023-02-19 DIAGNOSIS — D631 Anemia in chronic kidney disease: Secondary | ICD-10-CM | POA: Diagnosis not present

## 2023-02-19 DIAGNOSIS — Z23 Encounter for immunization: Secondary | ICD-10-CM | POA: Diagnosis not present

## 2023-02-20 DIAGNOSIS — N2589 Other disorders resulting from impaired renal tubular function: Secondary | ICD-10-CM | POA: Diagnosis not present

## 2023-02-20 DIAGNOSIS — N2581 Secondary hyperparathyroidism of renal origin: Secondary | ICD-10-CM | POA: Diagnosis not present

## 2023-02-20 DIAGNOSIS — D509 Iron deficiency anemia, unspecified: Secondary | ICD-10-CM | POA: Diagnosis not present

## 2023-02-20 DIAGNOSIS — Z992 Dependence on renal dialysis: Secondary | ICD-10-CM | POA: Diagnosis not present

## 2023-02-20 DIAGNOSIS — N186 End stage renal disease: Secondary | ICD-10-CM | POA: Diagnosis not present

## 2023-02-20 DIAGNOSIS — Z23 Encounter for immunization: Secondary | ICD-10-CM | POA: Diagnosis not present

## 2023-02-20 DIAGNOSIS — E875 Hyperkalemia: Secondary | ICD-10-CM | POA: Diagnosis not present

## 2023-02-20 DIAGNOSIS — D631 Anemia in chronic kidney disease: Secondary | ICD-10-CM | POA: Diagnosis not present

## 2023-02-20 DIAGNOSIS — R82998 Other abnormal findings in urine: Secondary | ICD-10-CM | POA: Diagnosis not present

## 2023-02-23 DIAGNOSIS — N2581 Secondary hyperparathyroidism of renal origin: Secondary | ICD-10-CM | POA: Diagnosis not present

## 2023-02-23 DIAGNOSIS — D631 Anemia in chronic kidney disease: Secondary | ICD-10-CM | POA: Diagnosis not present

## 2023-02-23 DIAGNOSIS — R82998 Other abnormal findings in urine: Secondary | ICD-10-CM | POA: Diagnosis not present

## 2023-02-23 DIAGNOSIS — N186 End stage renal disease: Secondary | ICD-10-CM | POA: Diagnosis not present

## 2023-02-23 DIAGNOSIS — D509 Iron deficiency anemia, unspecified: Secondary | ICD-10-CM | POA: Diagnosis not present

## 2023-02-23 DIAGNOSIS — Z992 Dependence on renal dialysis: Secondary | ICD-10-CM | POA: Diagnosis not present

## 2023-02-23 DIAGNOSIS — Z23 Encounter for immunization: Secondary | ICD-10-CM | POA: Diagnosis not present

## 2023-02-23 DIAGNOSIS — E875 Hyperkalemia: Secondary | ICD-10-CM | POA: Diagnosis not present

## 2023-02-23 DIAGNOSIS — N2589 Other disorders resulting from impaired renal tubular function: Secondary | ICD-10-CM | POA: Diagnosis not present

## 2023-02-26 DIAGNOSIS — N2581 Secondary hyperparathyroidism of renal origin: Secondary | ICD-10-CM | POA: Diagnosis not present

## 2023-02-26 DIAGNOSIS — D509 Iron deficiency anemia, unspecified: Secondary | ICD-10-CM | POA: Diagnosis not present

## 2023-02-26 DIAGNOSIS — D631 Anemia in chronic kidney disease: Secondary | ICD-10-CM | POA: Diagnosis not present

## 2023-02-26 DIAGNOSIS — N186 End stage renal disease: Secondary | ICD-10-CM | POA: Diagnosis not present

## 2023-02-26 DIAGNOSIS — N2589 Other disorders resulting from impaired renal tubular function: Secondary | ICD-10-CM | POA: Diagnosis not present

## 2023-02-26 DIAGNOSIS — Z992 Dependence on renal dialysis: Secondary | ICD-10-CM | POA: Diagnosis not present

## 2023-02-26 DIAGNOSIS — E875 Hyperkalemia: Secondary | ICD-10-CM | POA: Diagnosis not present

## 2023-02-26 DIAGNOSIS — Z23 Encounter for immunization: Secondary | ICD-10-CM | POA: Diagnosis not present

## 2023-02-26 DIAGNOSIS — R82998 Other abnormal findings in urine: Secondary | ICD-10-CM | POA: Diagnosis not present

## 2023-02-27 DIAGNOSIS — Z992 Dependence on renal dialysis: Secondary | ICD-10-CM | POA: Diagnosis not present

## 2023-02-27 DIAGNOSIS — D631 Anemia in chronic kidney disease: Secondary | ICD-10-CM | POA: Diagnosis not present

## 2023-02-27 DIAGNOSIS — N186 End stage renal disease: Secondary | ICD-10-CM | POA: Diagnosis not present

## 2023-02-27 DIAGNOSIS — D509 Iron deficiency anemia, unspecified: Secondary | ICD-10-CM | POA: Diagnosis not present

## 2023-02-27 DIAGNOSIS — N2589 Other disorders resulting from impaired renal tubular function: Secondary | ICD-10-CM | POA: Diagnosis not present

## 2023-02-27 DIAGNOSIS — N2581 Secondary hyperparathyroidism of renal origin: Secondary | ICD-10-CM | POA: Diagnosis not present

## 2023-02-27 DIAGNOSIS — Z23 Encounter for immunization: Secondary | ICD-10-CM | POA: Diagnosis not present

## 2023-02-27 DIAGNOSIS — R82998 Other abnormal findings in urine: Secondary | ICD-10-CM | POA: Diagnosis not present

## 2023-02-27 DIAGNOSIS — E875 Hyperkalemia: Secondary | ICD-10-CM | POA: Diagnosis not present

## 2023-03-02 DIAGNOSIS — Z992 Dependence on renal dialysis: Secondary | ICD-10-CM | POA: Diagnosis not present

## 2023-03-02 DIAGNOSIS — N2581 Secondary hyperparathyroidism of renal origin: Secondary | ICD-10-CM | POA: Diagnosis not present

## 2023-03-02 DIAGNOSIS — D631 Anemia in chronic kidney disease: Secondary | ICD-10-CM | POA: Diagnosis not present

## 2023-03-02 DIAGNOSIS — N2589 Other disorders resulting from impaired renal tubular function: Secondary | ICD-10-CM | POA: Diagnosis not present

## 2023-03-02 DIAGNOSIS — D509 Iron deficiency anemia, unspecified: Secondary | ICD-10-CM | POA: Diagnosis not present

## 2023-03-02 DIAGNOSIS — R82998 Other abnormal findings in urine: Secondary | ICD-10-CM | POA: Diagnosis not present

## 2023-03-02 DIAGNOSIS — E875 Hyperkalemia: Secondary | ICD-10-CM | POA: Diagnosis not present

## 2023-03-02 DIAGNOSIS — N186 End stage renal disease: Secondary | ICD-10-CM | POA: Diagnosis not present

## 2023-03-02 DIAGNOSIS — Z23 Encounter for immunization: Secondary | ICD-10-CM | POA: Diagnosis not present

## 2023-03-03 DIAGNOSIS — N2589 Other disorders resulting from impaired renal tubular function: Secondary | ICD-10-CM | POA: Diagnosis not present

## 2023-03-03 DIAGNOSIS — N2581 Secondary hyperparathyroidism of renal origin: Secondary | ICD-10-CM | POA: Diagnosis not present

## 2023-03-03 DIAGNOSIS — N186 End stage renal disease: Secondary | ICD-10-CM | POA: Diagnosis not present

## 2023-03-03 DIAGNOSIS — D631 Anemia in chronic kidney disease: Secondary | ICD-10-CM | POA: Diagnosis not present

## 2023-03-03 DIAGNOSIS — Z992 Dependence on renal dialysis: Secondary | ICD-10-CM | POA: Diagnosis not present

## 2023-03-03 DIAGNOSIS — E875 Hyperkalemia: Secondary | ICD-10-CM | POA: Diagnosis not present

## 2023-03-03 DIAGNOSIS — N049 Nephrotic syndrome with unspecified morphologic changes: Secondary | ICD-10-CM | POA: Diagnosis not present

## 2023-03-03 DIAGNOSIS — Z23 Encounter for immunization: Secondary | ICD-10-CM | POA: Diagnosis not present

## 2023-03-03 DIAGNOSIS — D509 Iron deficiency anemia, unspecified: Secondary | ICD-10-CM | POA: Diagnosis not present

## 2023-03-03 DIAGNOSIS — R82998 Other abnormal findings in urine: Secondary | ICD-10-CM | POA: Diagnosis not present

## 2023-03-05 DIAGNOSIS — E875 Hyperkalemia: Secondary | ICD-10-CM | POA: Diagnosis not present

## 2023-03-05 DIAGNOSIS — E440 Moderate protein-calorie malnutrition: Secondary | ICD-10-CM | POA: Diagnosis not present

## 2023-03-05 DIAGNOSIS — D631 Anemia in chronic kidney disease: Secondary | ICD-10-CM | POA: Diagnosis not present

## 2023-03-05 DIAGNOSIS — D509 Iron deficiency anemia, unspecified: Secondary | ICD-10-CM | POA: Diagnosis not present

## 2023-03-05 DIAGNOSIS — R17 Unspecified jaundice: Secondary | ICD-10-CM | POA: Diagnosis not present

## 2023-03-05 DIAGNOSIS — R82998 Other abnormal findings in urine: Secondary | ICD-10-CM | POA: Diagnosis not present

## 2023-03-05 DIAGNOSIS — N186 End stage renal disease: Secondary | ICD-10-CM | POA: Diagnosis not present

## 2023-03-05 DIAGNOSIS — Z992 Dependence on renal dialysis: Secondary | ICD-10-CM | POA: Diagnosis not present

## 2023-03-05 DIAGNOSIS — Z79899 Other long term (current) drug therapy: Secondary | ICD-10-CM | POA: Diagnosis not present

## 2023-03-05 DIAGNOSIS — N2581 Secondary hyperparathyroidism of renal origin: Secondary | ICD-10-CM | POA: Diagnosis not present

## 2023-03-06 DIAGNOSIS — E875 Hyperkalemia: Secondary | ICD-10-CM | POA: Diagnosis not present

## 2023-03-06 DIAGNOSIS — N2581 Secondary hyperparathyroidism of renal origin: Secondary | ICD-10-CM | POA: Diagnosis not present

## 2023-03-06 DIAGNOSIS — N186 End stage renal disease: Secondary | ICD-10-CM | POA: Diagnosis not present

## 2023-03-06 DIAGNOSIS — R82998 Other abnormal findings in urine: Secondary | ICD-10-CM | POA: Diagnosis not present

## 2023-03-06 DIAGNOSIS — D631 Anemia in chronic kidney disease: Secondary | ICD-10-CM | POA: Diagnosis not present

## 2023-03-06 DIAGNOSIS — E440 Moderate protein-calorie malnutrition: Secondary | ICD-10-CM | POA: Diagnosis not present

## 2023-03-06 DIAGNOSIS — Z79899 Other long term (current) drug therapy: Secondary | ICD-10-CM | POA: Diagnosis not present

## 2023-03-06 DIAGNOSIS — Z992 Dependence on renal dialysis: Secondary | ICD-10-CM | POA: Diagnosis not present

## 2023-03-06 DIAGNOSIS — R17 Unspecified jaundice: Secondary | ICD-10-CM | POA: Diagnosis not present

## 2023-03-06 DIAGNOSIS — D509 Iron deficiency anemia, unspecified: Secondary | ICD-10-CM | POA: Diagnosis not present

## 2023-03-09 DIAGNOSIS — D509 Iron deficiency anemia, unspecified: Secondary | ICD-10-CM | POA: Diagnosis not present

## 2023-03-09 DIAGNOSIS — Z992 Dependence on renal dialysis: Secondary | ICD-10-CM | POA: Diagnosis not present

## 2023-03-09 DIAGNOSIS — E440 Moderate protein-calorie malnutrition: Secondary | ICD-10-CM | POA: Diagnosis not present

## 2023-03-09 DIAGNOSIS — N186 End stage renal disease: Secondary | ICD-10-CM | POA: Diagnosis not present

## 2023-03-09 DIAGNOSIS — E875 Hyperkalemia: Secondary | ICD-10-CM | POA: Diagnosis not present

## 2023-03-09 DIAGNOSIS — R17 Unspecified jaundice: Secondary | ICD-10-CM | POA: Diagnosis not present

## 2023-03-09 DIAGNOSIS — Z79899 Other long term (current) drug therapy: Secondary | ICD-10-CM | POA: Diagnosis not present

## 2023-03-09 DIAGNOSIS — D631 Anemia in chronic kidney disease: Secondary | ICD-10-CM | POA: Diagnosis not present

## 2023-03-09 DIAGNOSIS — N2581 Secondary hyperparathyroidism of renal origin: Secondary | ICD-10-CM | POA: Diagnosis not present

## 2023-03-09 DIAGNOSIS — R82998 Other abnormal findings in urine: Secondary | ICD-10-CM | POA: Diagnosis not present

## 2023-03-10 DIAGNOSIS — E440 Moderate protein-calorie malnutrition: Secondary | ICD-10-CM | POA: Diagnosis not present

## 2023-03-10 DIAGNOSIS — N2581 Secondary hyperparathyroidism of renal origin: Secondary | ICD-10-CM | POA: Diagnosis not present

## 2023-03-10 DIAGNOSIS — E875 Hyperkalemia: Secondary | ICD-10-CM | POA: Diagnosis not present

## 2023-03-10 DIAGNOSIS — R82998 Other abnormal findings in urine: Secondary | ICD-10-CM | POA: Diagnosis not present

## 2023-03-10 DIAGNOSIS — N186 End stage renal disease: Secondary | ICD-10-CM | POA: Diagnosis not present

## 2023-03-10 DIAGNOSIS — D631 Anemia in chronic kidney disease: Secondary | ICD-10-CM | POA: Diagnosis not present

## 2023-03-10 DIAGNOSIS — Z79899 Other long term (current) drug therapy: Secondary | ICD-10-CM | POA: Diagnosis not present

## 2023-03-10 DIAGNOSIS — D509 Iron deficiency anemia, unspecified: Secondary | ICD-10-CM | POA: Diagnosis not present

## 2023-03-10 DIAGNOSIS — R17 Unspecified jaundice: Secondary | ICD-10-CM | POA: Diagnosis not present

## 2023-03-10 DIAGNOSIS — Z992 Dependence on renal dialysis: Secondary | ICD-10-CM | POA: Diagnosis not present

## 2023-03-12 DIAGNOSIS — E875 Hyperkalemia: Secondary | ICD-10-CM | POA: Diagnosis not present

## 2023-03-12 DIAGNOSIS — R17 Unspecified jaundice: Secondary | ICD-10-CM | POA: Diagnosis not present

## 2023-03-12 DIAGNOSIS — N2581 Secondary hyperparathyroidism of renal origin: Secondary | ICD-10-CM | POA: Diagnosis not present

## 2023-03-12 DIAGNOSIS — E440 Moderate protein-calorie malnutrition: Secondary | ICD-10-CM | POA: Diagnosis not present

## 2023-03-12 DIAGNOSIS — D631 Anemia in chronic kidney disease: Secondary | ICD-10-CM | POA: Diagnosis not present

## 2023-03-12 DIAGNOSIS — Z79899 Other long term (current) drug therapy: Secondary | ICD-10-CM | POA: Diagnosis not present

## 2023-03-12 DIAGNOSIS — N186 End stage renal disease: Secondary | ICD-10-CM | POA: Diagnosis not present

## 2023-03-12 DIAGNOSIS — R82998 Other abnormal findings in urine: Secondary | ICD-10-CM | POA: Diagnosis not present

## 2023-03-12 DIAGNOSIS — D509 Iron deficiency anemia, unspecified: Secondary | ICD-10-CM | POA: Diagnosis not present

## 2023-03-12 DIAGNOSIS — Z992 Dependence on renal dialysis: Secondary | ICD-10-CM | POA: Diagnosis not present

## 2023-03-13 DIAGNOSIS — N186 End stage renal disease: Secondary | ICD-10-CM | POA: Diagnosis not present

## 2023-03-13 DIAGNOSIS — R17 Unspecified jaundice: Secondary | ICD-10-CM | POA: Diagnosis not present

## 2023-03-13 DIAGNOSIS — D509 Iron deficiency anemia, unspecified: Secondary | ICD-10-CM | POA: Diagnosis not present

## 2023-03-13 DIAGNOSIS — D631 Anemia in chronic kidney disease: Secondary | ICD-10-CM | POA: Diagnosis not present

## 2023-03-13 DIAGNOSIS — Z992 Dependence on renal dialysis: Secondary | ICD-10-CM | POA: Diagnosis not present

## 2023-03-13 DIAGNOSIS — E440 Moderate protein-calorie malnutrition: Secondary | ICD-10-CM | POA: Diagnosis not present

## 2023-03-13 DIAGNOSIS — Z79899 Other long term (current) drug therapy: Secondary | ICD-10-CM | POA: Diagnosis not present

## 2023-03-13 DIAGNOSIS — N2581 Secondary hyperparathyroidism of renal origin: Secondary | ICD-10-CM | POA: Diagnosis not present

## 2023-03-13 DIAGNOSIS — R82998 Other abnormal findings in urine: Secondary | ICD-10-CM | POA: Diagnosis not present

## 2023-03-13 DIAGNOSIS — E875 Hyperkalemia: Secondary | ICD-10-CM | POA: Diagnosis not present

## 2023-03-16 DIAGNOSIS — R82998 Other abnormal findings in urine: Secondary | ICD-10-CM | POA: Diagnosis not present

## 2023-03-16 DIAGNOSIS — E875 Hyperkalemia: Secondary | ICD-10-CM | POA: Diagnosis not present

## 2023-03-16 DIAGNOSIS — Z79899 Other long term (current) drug therapy: Secondary | ICD-10-CM | POA: Diagnosis not present

## 2023-03-16 DIAGNOSIS — D631 Anemia in chronic kidney disease: Secondary | ICD-10-CM | POA: Diagnosis not present

## 2023-03-16 DIAGNOSIS — E440 Moderate protein-calorie malnutrition: Secondary | ICD-10-CM | POA: Diagnosis not present

## 2023-03-16 DIAGNOSIS — N2581 Secondary hyperparathyroidism of renal origin: Secondary | ICD-10-CM | POA: Diagnosis not present

## 2023-03-16 DIAGNOSIS — R17 Unspecified jaundice: Secondary | ICD-10-CM | POA: Diagnosis not present

## 2023-03-16 DIAGNOSIS — D509 Iron deficiency anemia, unspecified: Secondary | ICD-10-CM | POA: Diagnosis not present

## 2023-03-16 DIAGNOSIS — N186 End stage renal disease: Secondary | ICD-10-CM | POA: Diagnosis not present

## 2023-03-16 DIAGNOSIS — Z992 Dependence on renal dialysis: Secondary | ICD-10-CM | POA: Diagnosis not present

## 2023-03-17 DIAGNOSIS — E875 Hyperkalemia: Secondary | ICD-10-CM | POA: Diagnosis not present

## 2023-03-17 DIAGNOSIS — R17 Unspecified jaundice: Secondary | ICD-10-CM | POA: Diagnosis not present

## 2023-03-17 DIAGNOSIS — E440 Moderate protein-calorie malnutrition: Secondary | ICD-10-CM | POA: Diagnosis not present

## 2023-03-17 DIAGNOSIS — R82998 Other abnormal findings in urine: Secondary | ICD-10-CM | POA: Diagnosis not present

## 2023-03-17 DIAGNOSIS — N2581 Secondary hyperparathyroidism of renal origin: Secondary | ICD-10-CM | POA: Diagnosis not present

## 2023-03-17 DIAGNOSIS — D631 Anemia in chronic kidney disease: Secondary | ICD-10-CM | POA: Diagnosis not present

## 2023-03-17 DIAGNOSIS — Z79899 Other long term (current) drug therapy: Secondary | ICD-10-CM | POA: Diagnosis not present

## 2023-03-17 DIAGNOSIS — N186 End stage renal disease: Secondary | ICD-10-CM | POA: Diagnosis not present

## 2023-03-17 DIAGNOSIS — D509 Iron deficiency anemia, unspecified: Secondary | ICD-10-CM | POA: Diagnosis not present

## 2023-03-17 DIAGNOSIS — Z992 Dependence on renal dialysis: Secondary | ICD-10-CM | POA: Diagnosis not present

## 2023-03-19 DIAGNOSIS — D631 Anemia in chronic kidney disease: Secondary | ICD-10-CM | POA: Diagnosis not present

## 2023-03-19 DIAGNOSIS — Z992 Dependence on renal dialysis: Secondary | ICD-10-CM | POA: Diagnosis not present

## 2023-03-19 DIAGNOSIS — D509 Iron deficiency anemia, unspecified: Secondary | ICD-10-CM | POA: Diagnosis not present

## 2023-03-19 DIAGNOSIS — Z79899 Other long term (current) drug therapy: Secondary | ICD-10-CM | POA: Diagnosis not present

## 2023-03-19 DIAGNOSIS — E875 Hyperkalemia: Secondary | ICD-10-CM | POA: Diagnosis not present

## 2023-03-19 DIAGNOSIS — R82998 Other abnormal findings in urine: Secondary | ICD-10-CM | POA: Diagnosis not present

## 2023-03-19 DIAGNOSIS — R17 Unspecified jaundice: Secondary | ICD-10-CM | POA: Diagnosis not present

## 2023-03-19 DIAGNOSIS — E440 Moderate protein-calorie malnutrition: Secondary | ICD-10-CM | POA: Diagnosis not present

## 2023-03-19 DIAGNOSIS — N2581 Secondary hyperparathyroidism of renal origin: Secondary | ICD-10-CM | POA: Diagnosis not present

## 2023-03-19 DIAGNOSIS — N186 End stage renal disease: Secondary | ICD-10-CM | POA: Diagnosis not present

## 2023-03-20 DIAGNOSIS — E875 Hyperkalemia: Secondary | ICD-10-CM | POA: Diagnosis not present

## 2023-03-20 DIAGNOSIS — R17 Unspecified jaundice: Secondary | ICD-10-CM | POA: Diagnosis not present

## 2023-03-20 DIAGNOSIS — R82998 Other abnormal findings in urine: Secondary | ICD-10-CM | POA: Diagnosis not present

## 2023-03-20 DIAGNOSIS — D631 Anemia in chronic kidney disease: Secondary | ICD-10-CM | POA: Diagnosis not present

## 2023-03-20 DIAGNOSIS — N2581 Secondary hyperparathyroidism of renal origin: Secondary | ICD-10-CM | POA: Diagnosis not present

## 2023-03-20 DIAGNOSIS — D509 Iron deficiency anemia, unspecified: Secondary | ICD-10-CM | POA: Diagnosis not present

## 2023-03-20 DIAGNOSIS — E440 Moderate protein-calorie malnutrition: Secondary | ICD-10-CM | POA: Diagnosis not present

## 2023-03-20 DIAGNOSIS — N186 End stage renal disease: Secondary | ICD-10-CM | POA: Diagnosis not present

## 2023-03-20 DIAGNOSIS — Z992 Dependence on renal dialysis: Secondary | ICD-10-CM | POA: Diagnosis not present

## 2023-03-20 DIAGNOSIS — Z79899 Other long term (current) drug therapy: Secondary | ICD-10-CM | POA: Diagnosis not present

## 2023-03-26 DIAGNOSIS — E440 Moderate protein-calorie malnutrition: Secondary | ICD-10-CM | POA: Diagnosis not present

## 2023-03-26 DIAGNOSIS — N2581 Secondary hyperparathyroidism of renal origin: Secondary | ICD-10-CM | POA: Diagnosis not present

## 2023-03-26 DIAGNOSIS — R82998 Other abnormal findings in urine: Secondary | ICD-10-CM | POA: Diagnosis not present

## 2023-03-26 DIAGNOSIS — Z79899 Other long term (current) drug therapy: Secondary | ICD-10-CM | POA: Diagnosis not present

## 2023-03-26 DIAGNOSIS — E875 Hyperkalemia: Secondary | ICD-10-CM | POA: Diagnosis not present

## 2023-03-26 DIAGNOSIS — D631 Anemia in chronic kidney disease: Secondary | ICD-10-CM | POA: Diagnosis not present

## 2023-03-26 DIAGNOSIS — N186 End stage renal disease: Secondary | ICD-10-CM | POA: Diagnosis not present

## 2023-03-26 DIAGNOSIS — R17 Unspecified jaundice: Secondary | ICD-10-CM | POA: Diagnosis not present

## 2023-03-26 DIAGNOSIS — Z992 Dependence on renal dialysis: Secondary | ICD-10-CM | POA: Diagnosis not present

## 2023-03-26 DIAGNOSIS — D509 Iron deficiency anemia, unspecified: Secondary | ICD-10-CM | POA: Diagnosis not present

## 2023-03-27 DIAGNOSIS — D631 Anemia in chronic kidney disease: Secondary | ICD-10-CM | POA: Diagnosis not present

## 2023-03-27 DIAGNOSIS — R17 Unspecified jaundice: Secondary | ICD-10-CM | POA: Diagnosis not present

## 2023-03-27 DIAGNOSIS — E875 Hyperkalemia: Secondary | ICD-10-CM | POA: Diagnosis not present

## 2023-03-27 DIAGNOSIS — Z79899 Other long term (current) drug therapy: Secondary | ICD-10-CM | POA: Diagnosis not present

## 2023-03-27 DIAGNOSIS — R82998 Other abnormal findings in urine: Secondary | ICD-10-CM | POA: Diagnosis not present

## 2023-03-27 DIAGNOSIS — Z992 Dependence on renal dialysis: Secondary | ICD-10-CM | POA: Diagnosis not present

## 2023-03-27 DIAGNOSIS — N186 End stage renal disease: Secondary | ICD-10-CM | POA: Diagnosis not present

## 2023-03-27 DIAGNOSIS — E440 Moderate protein-calorie malnutrition: Secondary | ICD-10-CM | POA: Diagnosis not present

## 2023-03-27 DIAGNOSIS — D509 Iron deficiency anemia, unspecified: Secondary | ICD-10-CM | POA: Diagnosis not present

## 2023-03-27 DIAGNOSIS — N2581 Secondary hyperparathyroidism of renal origin: Secondary | ICD-10-CM | POA: Diagnosis not present

## 2023-03-30 DIAGNOSIS — E875 Hyperkalemia: Secondary | ICD-10-CM | POA: Diagnosis not present

## 2023-03-30 DIAGNOSIS — Z79899 Other long term (current) drug therapy: Secondary | ICD-10-CM | POA: Diagnosis not present

## 2023-03-30 DIAGNOSIS — R17 Unspecified jaundice: Secondary | ICD-10-CM | POA: Diagnosis not present

## 2023-03-30 DIAGNOSIS — D509 Iron deficiency anemia, unspecified: Secondary | ICD-10-CM | POA: Diagnosis not present

## 2023-03-30 DIAGNOSIS — N2581 Secondary hyperparathyroidism of renal origin: Secondary | ICD-10-CM | POA: Diagnosis not present

## 2023-03-30 DIAGNOSIS — N186 End stage renal disease: Secondary | ICD-10-CM | POA: Diagnosis not present

## 2023-03-30 DIAGNOSIS — D631 Anemia in chronic kidney disease: Secondary | ICD-10-CM | POA: Diagnosis not present

## 2023-03-30 DIAGNOSIS — E440 Moderate protein-calorie malnutrition: Secondary | ICD-10-CM | POA: Diagnosis not present

## 2023-03-30 DIAGNOSIS — R82998 Other abnormal findings in urine: Secondary | ICD-10-CM | POA: Diagnosis not present

## 2023-03-30 DIAGNOSIS — Z992 Dependence on renal dialysis: Secondary | ICD-10-CM | POA: Diagnosis not present

## 2023-04-01 DIAGNOSIS — R17 Unspecified jaundice: Secondary | ICD-10-CM | POA: Diagnosis not present

## 2023-04-01 DIAGNOSIS — Z992 Dependence on renal dialysis: Secondary | ICD-10-CM | POA: Diagnosis not present

## 2023-04-01 DIAGNOSIS — D631 Anemia in chronic kidney disease: Secondary | ICD-10-CM | POA: Diagnosis not present

## 2023-04-01 DIAGNOSIS — R82998 Other abnormal findings in urine: Secondary | ICD-10-CM | POA: Diagnosis not present

## 2023-04-01 DIAGNOSIS — N186 End stage renal disease: Secondary | ICD-10-CM | POA: Diagnosis not present

## 2023-04-01 DIAGNOSIS — E440 Moderate protein-calorie malnutrition: Secondary | ICD-10-CM | POA: Diagnosis not present

## 2023-04-01 DIAGNOSIS — Z79899 Other long term (current) drug therapy: Secondary | ICD-10-CM | POA: Diagnosis not present

## 2023-04-01 DIAGNOSIS — D509 Iron deficiency anemia, unspecified: Secondary | ICD-10-CM | POA: Diagnosis not present

## 2023-04-01 DIAGNOSIS — E875 Hyperkalemia: Secondary | ICD-10-CM | POA: Diagnosis not present

## 2023-04-01 DIAGNOSIS — N2581 Secondary hyperparathyroidism of renal origin: Secondary | ICD-10-CM | POA: Diagnosis not present

## 2023-04-02 DIAGNOSIS — E875 Hyperkalemia: Secondary | ICD-10-CM | POA: Diagnosis not present

## 2023-04-02 DIAGNOSIS — N2581 Secondary hyperparathyroidism of renal origin: Secondary | ICD-10-CM | POA: Diagnosis not present

## 2023-04-02 DIAGNOSIS — E440 Moderate protein-calorie malnutrition: Secondary | ICD-10-CM | POA: Diagnosis not present

## 2023-04-02 DIAGNOSIS — R17 Unspecified jaundice: Secondary | ICD-10-CM | POA: Diagnosis not present

## 2023-04-02 DIAGNOSIS — Z79899 Other long term (current) drug therapy: Secondary | ICD-10-CM | POA: Diagnosis not present

## 2023-04-02 DIAGNOSIS — N186 End stage renal disease: Secondary | ICD-10-CM | POA: Diagnosis not present

## 2023-04-02 DIAGNOSIS — D509 Iron deficiency anemia, unspecified: Secondary | ICD-10-CM | POA: Diagnosis not present

## 2023-04-02 DIAGNOSIS — R82998 Other abnormal findings in urine: Secondary | ICD-10-CM | POA: Diagnosis not present

## 2023-04-02 DIAGNOSIS — Z992 Dependence on renal dialysis: Secondary | ICD-10-CM | POA: Diagnosis not present

## 2023-04-02 DIAGNOSIS — D631 Anemia in chronic kidney disease: Secondary | ICD-10-CM | POA: Diagnosis not present

## 2023-04-03 DIAGNOSIS — N049 Nephrotic syndrome with unspecified morphologic changes: Secondary | ICD-10-CM | POA: Diagnosis not present

## 2023-04-03 DIAGNOSIS — Z79899 Other long term (current) drug therapy: Secondary | ICD-10-CM | POA: Diagnosis not present

## 2023-04-03 DIAGNOSIS — R17 Unspecified jaundice: Secondary | ICD-10-CM | POA: Diagnosis not present

## 2023-04-03 DIAGNOSIS — N186 End stage renal disease: Secondary | ICD-10-CM | POA: Diagnosis not present

## 2023-04-03 DIAGNOSIS — Z992 Dependence on renal dialysis: Secondary | ICD-10-CM | POA: Diagnosis not present

## 2023-04-03 DIAGNOSIS — D631 Anemia in chronic kidney disease: Secondary | ICD-10-CM | POA: Diagnosis not present

## 2023-04-03 DIAGNOSIS — R82998 Other abnormal findings in urine: Secondary | ICD-10-CM | POA: Diagnosis not present

## 2023-04-03 DIAGNOSIS — D509 Iron deficiency anemia, unspecified: Secondary | ICD-10-CM | POA: Diagnosis not present

## 2023-04-03 DIAGNOSIS — E440 Moderate protein-calorie malnutrition: Secondary | ICD-10-CM | POA: Diagnosis not present

## 2023-04-03 DIAGNOSIS — E875 Hyperkalemia: Secondary | ICD-10-CM | POA: Diagnosis not present

## 2023-04-03 DIAGNOSIS — N2581 Secondary hyperparathyroidism of renal origin: Secondary | ICD-10-CM | POA: Diagnosis not present

## 2023-04-06 DIAGNOSIS — N2581 Secondary hyperparathyroidism of renal origin: Secondary | ICD-10-CM | POA: Diagnosis not present

## 2023-04-06 DIAGNOSIS — Z992 Dependence on renal dialysis: Secondary | ICD-10-CM | POA: Diagnosis not present

## 2023-04-06 DIAGNOSIS — D509 Iron deficiency anemia, unspecified: Secondary | ICD-10-CM | POA: Diagnosis not present

## 2023-04-06 DIAGNOSIS — D631 Anemia in chronic kidney disease: Secondary | ICD-10-CM | POA: Diagnosis not present

## 2023-04-06 DIAGNOSIS — R17 Unspecified jaundice: Secondary | ICD-10-CM | POA: Diagnosis not present

## 2023-04-06 DIAGNOSIS — Z79899 Other long term (current) drug therapy: Secondary | ICD-10-CM | POA: Diagnosis not present

## 2023-04-06 DIAGNOSIS — E875 Hyperkalemia: Secondary | ICD-10-CM | POA: Diagnosis not present

## 2023-04-06 DIAGNOSIS — N186 End stage renal disease: Secondary | ICD-10-CM | POA: Diagnosis not present

## 2023-04-06 DIAGNOSIS — R82998 Other abnormal findings in urine: Secondary | ICD-10-CM | POA: Diagnosis not present

## 2023-04-06 DIAGNOSIS — E440 Moderate protein-calorie malnutrition: Secondary | ICD-10-CM | POA: Diagnosis not present

## 2023-04-07 DIAGNOSIS — R82998 Other abnormal findings in urine: Secondary | ICD-10-CM | POA: Diagnosis not present

## 2023-04-07 DIAGNOSIS — N186 End stage renal disease: Secondary | ICD-10-CM | POA: Diagnosis not present

## 2023-04-07 DIAGNOSIS — N2581 Secondary hyperparathyroidism of renal origin: Secondary | ICD-10-CM | POA: Diagnosis not present

## 2023-04-07 DIAGNOSIS — R17 Unspecified jaundice: Secondary | ICD-10-CM | POA: Diagnosis not present

## 2023-04-07 DIAGNOSIS — E440 Moderate protein-calorie malnutrition: Secondary | ICD-10-CM | POA: Diagnosis not present

## 2023-04-07 DIAGNOSIS — D631 Anemia in chronic kidney disease: Secondary | ICD-10-CM | POA: Diagnosis not present

## 2023-04-07 DIAGNOSIS — Z79899 Other long term (current) drug therapy: Secondary | ICD-10-CM | POA: Diagnosis not present

## 2023-04-07 DIAGNOSIS — Z992 Dependence on renal dialysis: Secondary | ICD-10-CM | POA: Diagnosis not present

## 2023-04-07 DIAGNOSIS — D509 Iron deficiency anemia, unspecified: Secondary | ICD-10-CM | POA: Diagnosis not present

## 2023-04-07 DIAGNOSIS — E875 Hyperkalemia: Secondary | ICD-10-CM | POA: Diagnosis not present

## 2023-04-09 DIAGNOSIS — Z992 Dependence on renal dialysis: Secondary | ICD-10-CM | POA: Diagnosis not present

## 2023-04-09 DIAGNOSIS — Z79899 Other long term (current) drug therapy: Secondary | ICD-10-CM | POA: Diagnosis not present

## 2023-04-09 DIAGNOSIS — R17 Unspecified jaundice: Secondary | ICD-10-CM | POA: Diagnosis not present

## 2023-04-09 DIAGNOSIS — D509 Iron deficiency anemia, unspecified: Secondary | ICD-10-CM | POA: Diagnosis not present

## 2023-04-09 DIAGNOSIS — E875 Hyperkalemia: Secondary | ICD-10-CM | POA: Diagnosis not present

## 2023-04-09 DIAGNOSIS — E440 Moderate protein-calorie malnutrition: Secondary | ICD-10-CM | POA: Diagnosis not present

## 2023-04-09 DIAGNOSIS — N2581 Secondary hyperparathyroidism of renal origin: Secondary | ICD-10-CM | POA: Diagnosis not present

## 2023-04-09 DIAGNOSIS — N186 End stage renal disease: Secondary | ICD-10-CM | POA: Diagnosis not present

## 2023-04-09 DIAGNOSIS — R82998 Other abnormal findings in urine: Secondary | ICD-10-CM | POA: Diagnosis not present

## 2023-04-09 DIAGNOSIS — D631 Anemia in chronic kidney disease: Secondary | ICD-10-CM | POA: Diagnosis not present

## 2023-04-10 DIAGNOSIS — R17 Unspecified jaundice: Secondary | ICD-10-CM | POA: Diagnosis not present

## 2023-04-10 DIAGNOSIS — R82998 Other abnormal findings in urine: Secondary | ICD-10-CM | POA: Diagnosis not present

## 2023-04-10 DIAGNOSIS — E875 Hyperkalemia: Secondary | ICD-10-CM | POA: Diagnosis not present

## 2023-04-10 DIAGNOSIS — D509 Iron deficiency anemia, unspecified: Secondary | ICD-10-CM | POA: Diagnosis not present

## 2023-04-10 DIAGNOSIS — E440 Moderate protein-calorie malnutrition: Secondary | ICD-10-CM | POA: Diagnosis not present

## 2023-04-10 DIAGNOSIS — D631 Anemia in chronic kidney disease: Secondary | ICD-10-CM | POA: Diagnosis not present

## 2023-04-10 DIAGNOSIS — N2581 Secondary hyperparathyroidism of renal origin: Secondary | ICD-10-CM | POA: Diagnosis not present

## 2023-04-10 DIAGNOSIS — Z79899 Other long term (current) drug therapy: Secondary | ICD-10-CM | POA: Diagnosis not present

## 2023-04-10 DIAGNOSIS — N186 End stage renal disease: Secondary | ICD-10-CM | POA: Diagnosis not present

## 2023-04-10 DIAGNOSIS — Z992 Dependence on renal dialysis: Secondary | ICD-10-CM | POA: Diagnosis not present

## 2023-04-16 DIAGNOSIS — D509 Iron deficiency anemia, unspecified: Secondary | ICD-10-CM | POA: Diagnosis not present

## 2023-04-16 DIAGNOSIS — N186 End stage renal disease: Secondary | ICD-10-CM | POA: Diagnosis not present

## 2023-04-16 DIAGNOSIS — D631 Anemia in chronic kidney disease: Secondary | ICD-10-CM | POA: Diagnosis not present

## 2023-04-16 DIAGNOSIS — R82998 Other abnormal findings in urine: Secondary | ICD-10-CM | POA: Diagnosis not present

## 2023-04-16 DIAGNOSIS — E440 Moderate protein-calorie malnutrition: Secondary | ICD-10-CM | POA: Diagnosis not present

## 2023-04-16 DIAGNOSIS — N2581 Secondary hyperparathyroidism of renal origin: Secondary | ICD-10-CM | POA: Diagnosis not present

## 2023-04-16 DIAGNOSIS — Z992 Dependence on renal dialysis: Secondary | ICD-10-CM | POA: Diagnosis not present

## 2023-04-16 DIAGNOSIS — R17 Unspecified jaundice: Secondary | ICD-10-CM | POA: Diagnosis not present

## 2023-04-16 DIAGNOSIS — Z79899 Other long term (current) drug therapy: Secondary | ICD-10-CM | POA: Diagnosis not present

## 2023-04-16 DIAGNOSIS — E875 Hyperkalemia: Secondary | ICD-10-CM | POA: Diagnosis not present

## 2023-04-17 DIAGNOSIS — R17 Unspecified jaundice: Secondary | ICD-10-CM | POA: Diagnosis not present

## 2023-04-17 DIAGNOSIS — Z79899 Other long term (current) drug therapy: Secondary | ICD-10-CM | POA: Diagnosis not present

## 2023-04-17 DIAGNOSIS — Z992 Dependence on renal dialysis: Secondary | ICD-10-CM | POA: Diagnosis not present

## 2023-04-17 DIAGNOSIS — E440 Moderate protein-calorie malnutrition: Secondary | ICD-10-CM | POA: Diagnosis not present

## 2023-04-17 DIAGNOSIS — N2581 Secondary hyperparathyroidism of renal origin: Secondary | ICD-10-CM | POA: Diagnosis not present

## 2023-04-17 DIAGNOSIS — D509 Iron deficiency anemia, unspecified: Secondary | ICD-10-CM | POA: Diagnosis not present

## 2023-04-17 DIAGNOSIS — E875 Hyperkalemia: Secondary | ICD-10-CM | POA: Diagnosis not present

## 2023-04-17 DIAGNOSIS — N186 End stage renal disease: Secondary | ICD-10-CM | POA: Diagnosis not present

## 2023-04-17 DIAGNOSIS — R82998 Other abnormal findings in urine: Secondary | ICD-10-CM | POA: Diagnosis not present

## 2023-04-17 DIAGNOSIS — D631 Anemia in chronic kidney disease: Secondary | ICD-10-CM | POA: Diagnosis not present

## 2023-04-19 DIAGNOSIS — D631 Anemia in chronic kidney disease: Secondary | ICD-10-CM | POA: Diagnosis not present

## 2023-04-19 DIAGNOSIS — R17 Unspecified jaundice: Secondary | ICD-10-CM | POA: Diagnosis not present

## 2023-04-19 DIAGNOSIS — N2581 Secondary hyperparathyroidism of renal origin: Secondary | ICD-10-CM | POA: Diagnosis not present

## 2023-04-19 DIAGNOSIS — Z992 Dependence on renal dialysis: Secondary | ICD-10-CM | POA: Diagnosis not present

## 2023-04-19 DIAGNOSIS — N186 End stage renal disease: Secondary | ICD-10-CM | POA: Diagnosis not present

## 2023-04-19 DIAGNOSIS — R82998 Other abnormal findings in urine: Secondary | ICD-10-CM | POA: Diagnosis not present

## 2023-04-19 DIAGNOSIS — D509 Iron deficiency anemia, unspecified: Secondary | ICD-10-CM | POA: Diagnosis not present

## 2023-04-19 DIAGNOSIS — E440 Moderate protein-calorie malnutrition: Secondary | ICD-10-CM | POA: Diagnosis not present

## 2023-04-19 DIAGNOSIS — Z79899 Other long term (current) drug therapy: Secondary | ICD-10-CM | POA: Diagnosis not present

## 2023-04-19 DIAGNOSIS — E875 Hyperkalemia: Secondary | ICD-10-CM | POA: Diagnosis not present

## 2023-04-20 DIAGNOSIS — N186 End stage renal disease: Secondary | ICD-10-CM | POA: Diagnosis not present

## 2023-04-20 DIAGNOSIS — E440 Moderate protein-calorie malnutrition: Secondary | ICD-10-CM | POA: Diagnosis not present

## 2023-04-20 DIAGNOSIS — D509 Iron deficiency anemia, unspecified: Secondary | ICD-10-CM | POA: Diagnosis not present

## 2023-04-20 DIAGNOSIS — N2581 Secondary hyperparathyroidism of renal origin: Secondary | ICD-10-CM | POA: Diagnosis not present

## 2023-04-20 DIAGNOSIS — R82998 Other abnormal findings in urine: Secondary | ICD-10-CM | POA: Diagnosis not present

## 2023-04-20 DIAGNOSIS — E875 Hyperkalemia: Secondary | ICD-10-CM | POA: Diagnosis not present

## 2023-04-20 DIAGNOSIS — Z992 Dependence on renal dialysis: Secondary | ICD-10-CM | POA: Diagnosis not present

## 2023-04-20 DIAGNOSIS — R17 Unspecified jaundice: Secondary | ICD-10-CM | POA: Diagnosis not present

## 2023-04-20 DIAGNOSIS — D631 Anemia in chronic kidney disease: Secondary | ICD-10-CM | POA: Diagnosis not present

## 2023-04-20 DIAGNOSIS — Z79899 Other long term (current) drug therapy: Secondary | ICD-10-CM | POA: Diagnosis not present

## 2023-04-21 DIAGNOSIS — Z992 Dependence on renal dialysis: Secondary | ICD-10-CM | POA: Diagnosis not present

## 2023-04-21 DIAGNOSIS — R17 Unspecified jaundice: Secondary | ICD-10-CM | POA: Diagnosis not present

## 2023-04-21 DIAGNOSIS — D631 Anemia in chronic kidney disease: Secondary | ICD-10-CM | POA: Diagnosis not present

## 2023-04-21 DIAGNOSIS — N186 End stage renal disease: Secondary | ICD-10-CM | POA: Diagnosis not present

## 2023-04-21 DIAGNOSIS — Z79899 Other long term (current) drug therapy: Secondary | ICD-10-CM | POA: Diagnosis not present

## 2023-04-21 DIAGNOSIS — N2581 Secondary hyperparathyroidism of renal origin: Secondary | ICD-10-CM | POA: Diagnosis not present

## 2023-04-21 DIAGNOSIS — E875 Hyperkalemia: Secondary | ICD-10-CM | POA: Diagnosis not present

## 2023-04-21 DIAGNOSIS — D509 Iron deficiency anemia, unspecified: Secondary | ICD-10-CM | POA: Diagnosis not present

## 2023-04-21 DIAGNOSIS — R82998 Other abnormal findings in urine: Secondary | ICD-10-CM | POA: Diagnosis not present

## 2023-04-21 DIAGNOSIS — E440 Moderate protein-calorie malnutrition: Secondary | ICD-10-CM | POA: Diagnosis not present

## 2023-04-23 DIAGNOSIS — R17 Unspecified jaundice: Secondary | ICD-10-CM | POA: Diagnosis not present

## 2023-04-23 DIAGNOSIS — D509 Iron deficiency anemia, unspecified: Secondary | ICD-10-CM | POA: Diagnosis not present

## 2023-04-23 DIAGNOSIS — N186 End stage renal disease: Secondary | ICD-10-CM | POA: Diagnosis not present

## 2023-04-23 DIAGNOSIS — E875 Hyperkalemia: Secondary | ICD-10-CM | POA: Diagnosis not present

## 2023-04-23 DIAGNOSIS — D631 Anemia in chronic kidney disease: Secondary | ICD-10-CM | POA: Diagnosis not present

## 2023-04-23 DIAGNOSIS — R82998 Other abnormal findings in urine: Secondary | ICD-10-CM | POA: Diagnosis not present

## 2023-04-23 DIAGNOSIS — N2581 Secondary hyperparathyroidism of renal origin: Secondary | ICD-10-CM | POA: Diagnosis not present

## 2023-04-23 DIAGNOSIS — E440 Moderate protein-calorie malnutrition: Secondary | ICD-10-CM | POA: Diagnosis not present

## 2023-04-23 DIAGNOSIS — Z992 Dependence on renal dialysis: Secondary | ICD-10-CM | POA: Diagnosis not present

## 2023-04-23 DIAGNOSIS — Z79899 Other long term (current) drug therapy: Secondary | ICD-10-CM | POA: Diagnosis not present

## 2023-04-24 DIAGNOSIS — R17 Unspecified jaundice: Secondary | ICD-10-CM | POA: Diagnosis not present

## 2023-04-24 DIAGNOSIS — N186 End stage renal disease: Secondary | ICD-10-CM | POA: Diagnosis not present

## 2023-04-24 DIAGNOSIS — E875 Hyperkalemia: Secondary | ICD-10-CM | POA: Diagnosis not present

## 2023-04-24 DIAGNOSIS — D631 Anemia in chronic kidney disease: Secondary | ICD-10-CM | POA: Diagnosis not present

## 2023-04-24 DIAGNOSIS — N2581 Secondary hyperparathyroidism of renal origin: Secondary | ICD-10-CM | POA: Diagnosis not present

## 2023-04-24 DIAGNOSIS — Z79899 Other long term (current) drug therapy: Secondary | ICD-10-CM | POA: Diagnosis not present

## 2023-04-24 DIAGNOSIS — R82998 Other abnormal findings in urine: Secondary | ICD-10-CM | POA: Diagnosis not present

## 2023-04-24 DIAGNOSIS — Z992 Dependence on renal dialysis: Secondary | ICD-10-CM | POA: Diagnosis not present

## 2023-04-24 DIAGNOSIS — D509 Iron deficiency anemia, unspecified: Secondary | ICD-10-CM | POA: Diagnosis not present

## 2023-04-24 DIAGNOSIS — E440 Moderate protein-calorie malnutrition: Secondary | ICD-10-CM | POA: Diagnosis not present

## 2023-04-27 DIAGNOSIS — Z992 Dependence on renal dialysis: Secondary | ICD-10-CM | POA: Diagnosis not present

## 2023-04-27 DIAGNOSIS — R17 Unspecified jaundice: Secondary | ICD-10-CM | POA: Diagnosis not present

## 2023-04-27 DIAGNOSIS — E875 Hyperkalemia: Secondary | ICD-10-CM | POA: Diagnosis not present

## 2023-04-27 DIAGNOSIS — D631 Anemia in chronic kidney disease: Secondary | ICD-10-CM | POA: Diagnosis not present

## 2023-04-27 DIAGNOSIS — D509 Iron deficiency anemia, unspecified: Secondary | ICD-10-CM | POA: Diagnosis not present

## 2023-04-27 DIAGNOSIS — Z79899 Other long term (current) drug therapy: Secondary | ICD-10-CM | POA: Diagnosis not present

## 2023-04-27 DIAGNOSIS — E440 Moderate protein-calorie malnutrition: Secondary | ICD-10-CM | POA: Diagnosis not present

## 2023-04-27 DIAGNOSIS — R82998 Other abnormal findings in urine: Secondary | ICD-10-CM | POA: Diagnosis not present

## 2023-04-27 DIAGNOSIS — N2581 Secondary hyperparathyroidism of renal origin: Secondary | ICD-10-CM | POA: Diagnosis not present

## 2023-04-27 DIAGNOSIS — N186 End stage renal disease: Secondary | ICD-10-CM | POA: Diagnosis not present

## 2023-04-30 DIAGNOSIS — D631 Anemia in chronic kidney disease: Secondary | ICD-10-CM | POA: Diagnosis not present

## 2023-04-30 DIAGNOSIS — E440 Moderate protein-calorie malnutrition: Secondary | ICD-10-CM | POA: Diagnosis not present

## 2023-04-30 DIAGNOSIS — Z79899 Other long term (current) drug therapy: Secondary | ICD-10-CM | POA: Diagnosis not present

## 2023-04-30 DIAGNOSIS — R17 Unspecified jaundice: Secondary | ICD-10-CM | POA: Diagnosis not present

## 2023-04-30 DIAGNOSIS — N186 End stage renal disease: Secondary | ICD-10-CM | POA: Diagnosis not present

## 2023-04-30 DIAGNOSIS — R82998 Other abnormal findings in urine: Secondary | ICD-10-CM | POA: Diagnosis not present

## 2023-04-30 DIAGNOSIS — D509 Iron deficiency anemia, unspecified: Secondary | ICD-10-CM | POA: Diagnosis not present

## 2023-04-30 DIAGNOSIS — Z992 Dependence on renal dialysis: Secondary | ICD-10-CM | POA: Diagnosis not present

## 2023-04-30 DIAGNOSIS — E875 Hyperkalemia: Secondary | ICD-10-CM | POA: Diagnosis not present

## 2023-04-30 DIAGNOSIS — N2581 Secondary hyperparathyroidism of renal origin: Secondary | ICD-10-CM | POA: Diagnosis not present

## 2023-05-01 DIAGNOSIS — E440 Moderate protein-calorie malnutrition: Secondary | ICD-10-CM | POA: Diagnosis not present

## 2023-05-01 DIAGNOSIS — R82998 Other abnormal findings in urine: Secondary | ICD-10-CM | POA: Diagnosis not present

## 2023-05-01 DIAGNOSIS — N049 Nephrotic syndrome with unspecified morphologic changes: Secondary | ICD-10-CM | POA: Diagnosis not present

## 2023-05-01 DIAGNOSIS — Z992 Dependence on renal dialysis: Secondary | ICD-10-CM | POA: Diagnosis not present

## 2023-05-01 DIAGNOSIS — Z79899 Other long term (current) drug therapy: Secondary | ICD-10-CM | POA: Diagnosis not present

## 2023-05-01 DIAGNOSIS — N2581 Secondary hyperparathyroidism of renal origin: Secondary | ICD-10-CM | POA: Diagnosis not present

## 2023-05-01 DIAGNOSIS — R17 Unspecified jaundice: Secondary | ICD-10-CM | POA: Diagnosis not present

## 2023-05-01 DIAGNOSIS — N186 End stage renal disease: Secondary | ICD-10-CM | POA: Diagnosis not present

## 2023-05-01 DIAGNOSIS — E875 Hyperkalemia: Secondary | ICD-10-CM | POA: Diagnosis not present

## 2023-05-01 DIAGNOSIS — D509 Iron deficiency anemia, unspecified: Secondary | ICD-10-CM | POA: Diagnosis not present

## 2023-05-01 DIAGNOSIS — D631 Anemia in chronic kidney disease: Secondary | ICD-10-CM | POA: Diagnosis not present

## 2023-05-04 DIAGNOSIS — N2581 Secondary hyperparathyroidism of renal origin: Secondary | ICD-10-CM | POA: Diagnosis not present

## 2023-05-04 DIAGNOSIS — N186 End stage renal disease: Secondary | ICD-10-CM | POA: Diagnosis not present

## 2023-05-04 DIAGNOSIS — D509 Iron deficiency anemia, unspecified: Secondary | ICD-10-CM | POA: Diagnosis not present

## 2023-05-04 DIAGNOSIS — D631 Anemia in chronic kidney disease: Secondary | ICD-10-CM | POA: Diagnosis not present

## 2023-05-04 DIAGNOSIS — Z992 Dependence on renal dialysis: Secondary | ICD-10-CM | POA: Diagnosis not present

## 2023-05-04 DIAGNOSIS — E875 Hyperkalemia: Secondary | ICD-10-CM | POA: Diagnosis not present

## 2023-05-04 DIAGNOSIS — N2589 Other disorders resulting from impaired renal tubular function: Secondary | ICD-10-CM | POA: Diagnosis not present

## 2023-05-04 DIAGNOSIS — R82998 Other abnormal findings in urine: Secondary | ICD-10-CM | POA: Diagnosis not present

## 2023-05-05 DIAGNOSIS — N2581 Secondary hyperparathyroidism of renal origin: Secondary | ICD-10-CM | POA: Diagnosis not present

## 2023-05-05 DIAGNOSIS — Z992 Dependence on renal dialysis: Secondary | ICD-10-CM | POA: Diagnosis not present

## 2023-05-05 DIAGNOSIS — D631 Anemia in chronic kidney disease: Secondary | ICD-10-CM | POA: Diagnosis not present

## 2023-05-05 DIAGNOSIS — N2589 Other disorders resulting from impaired renal tubular function: Secondary | ICD-10-CM | POA: Diagnosis not present

## 2023-05-05 DIAGNOSIS — R82998 Other abnormal findings in urine: Secondary | ICD-10-CM | POA: Diagnosis not present

## 2023-05-05 DIAGNOSIS — E875 Hyperkalemia: Secondary | ICD-10-CM | POA: Diagnosis not present

## 2023-05-05 DIAGNOSIS — N186 End stage renal disease: Secondary | ICD-10-CM | POA: Diagnosis not present

## 2023-05-05 DIAGNOSIS — D509 Iron deficiency anemia, unspecified: Secondary | ICD-10-CM | POA: Diagnosis not present

## 2023-05-07 DIAGNOSIS — D631 Anemia in chronic kidney disease: Secondary | ICD-10-CM | POA: Diagnosis not present

## 2023-05-07 DIAGNOSIS — Z992 Dependence on renal dialysis: Secondary | ICD-10-CM | POA: Diagnosis not present

## 2023-05-07 DIAGNOSIS — E875 Hyperkalemia: Secondary | ICD-10-CM | POA: Diagnosis not present

## 2023-05-07 DIAGNOSIS — N2589 Other disorders resulting from impaired renal tubular function: Secondary | ICD-10-CM | POA: Diagnosis not present

## 2023-05-07 DIAGNOSIS — R82998 Other abnormal findings in urine: Secondary | ICD-10-CM | POA: Diagnosis not present

## 2023-05-07 DIAGNOSIS — D509 Iron deficiency anemia, unspecified: Secondary | ICD-10-CM | POA: Diagnosis not present

## 2023-05-07 DIAGNOSIS — N186 End stage renal disease: Secondary | ICD-10-CM | POA: Diagnosis not present

## 2023-05-07 DIAGNOSIS — N2581 Secondary hyperparathyroidism of renal origin: Secondary | ICD-10-CM | POA: Diagnosis not present

## 2023-05-08 DIAGNOSIS — D631 Anemia in chronic kidney disease: Secondary | ICD-10-CM | POA: Diagnosis not present

## 2023-05-08 DIAGNOSIS — R82998 Other abnormal findings in urine: Secondary | ICD-10-CM | POA: Diagnosis not present

## 2023-05-08 DIAGNOSIS — N186 End stage renal disease: Secondary | ICD-10-CM | POA: Diagnosis not present

## 2023-05-08 DIAGNOSIS — Z992 Dependence on renal dialysis: Secondary | ICD-10-CM | POA: Diagnosis not present

## 2023-05-08 DIAGNOSIS — E875 Hyperkalemia: Secondary | ICD-10-CM | POA: Diagnosis not present

## 2023-05-08 DIAGNOSIS — N2589 Other disorders resulting from impaired renal tubular function: Secondary | ICD-10-CM | POA: Diagnosis not present

## 2023-05-08 DIAGNOSIS — N2581 Secondary hyperparathyroidism of renal origin: Secondary | ICD-10-CM | POA: Diagnosis not present

## 2023-05-08 DIAGNOSIS — D509 Iron deficiency anemia, unspecified: Secondary | ICD-10-CM | POA: Diagnosis not present

## 2023-05-11 DIAGNOSIS — N2581 Secondary hyperparathyroidism of renal origin: Secondary | ICD-10-CM | POA: Diagnosis not present

## 2023-05-11 DIAGNOSIS — E875 Hyperkalemia: Secondary | ICD-10-CM | POA: Diagnosis not present

## 2023-05-11 DIAGNOSIS — Z992 Dependence on renal dialysis: Secondary | ICD-10-CM | POA: Diagnosis not present

## 2023-05-11 DIAGNOSIS — D509 Iron deficiency anemia, unspecified: Secondary | ICD-10-CM | POA: Diagnosis not present

## 2023-05-11 DIAGNOSIS — N2589 Other disorders resulting from impaired renal tubular function: Secondary | ICD-10-CM | POA: Diagnosis not present

## 2023-05-11 DIAGNOSIS — N186 End stage renal disease: Secondary | ICD-10-CM | POA: Diagnosis not present

## 2023-05-11 DIAGNOSIS — R82998 Other abnormal findings in urine: Secondary | ICD-10-CM | POA: Diagnosis not present

## 2023-05-11 DIAGNOSIS — D631 Anemia in chronic kidney disease: Secondary | ICD-10-CM | POA: Diagnosis not present

## 2023-05-12 DIAGNOSIS — N2589 Other disorders resulting from impaired renal tubular function: Secondary | ICD-10-CM | POA: Diagnosis not present

## 2023-05-12 DIAGNOSIS — D631 Anemia in chronic kidney disease: Secondary | ICD-10-CM | POA: Diagnosis not present

## 2023-05-12 DIAGNOSIS — R82998 Other abnormal findings in urine: Secondary | ICD-10-CM | POA: Diagnosis not present

## 2023-05-12 DIAGNOSIS — Z992 Dependence on renal dialysis: Secondary | ICD-10-CM | POA: Diagnosis not present

## 2023-05-12 DIAGNOSIS — D509 Iron deficiency anemia, unspecified: Secondary | ICD-10-CM | POA: Diagnosis not present

## 2023-05-12 DIAGNOSIS — N186 End stage renal disease: Secondary | ICD-10-CM | POA: Diagnosis not present

## 2023-05-12 DIAGNOSIS — N2581 Secondary hyperparathyroidism of renal origin: Secondary | ICD-10-CM | POA: Diagnosis not present

## 2023-05-12 DIAGNOSIS — E875 Hyperkalemia: Secondary | ICD-10-CM | POA: Diagnosis not present

## 2023-05-14 DIAGNOSIS — D631 Anemia in chronic kidney disease: Secondary | ICD-10-CM | POA: Diagnosis not present

## 2023-05-14 DIAGNOSIS — D509 Iron deficiency anemia, unspecified: Secondary | ICD-10-CM | POA: Diagnosis not present

## 2023-05-14 DIAGNOSIS — N186 End stage renal disease: Secondary | ICD-10-CM | POA: Diagnosis not present

## 2023-05-14 DIAGNOSIS — Z992 Dependence on renal dialysis: Secondary | ICD-10-CM | POA: Diagnosis not present

## 2023-05-14 DIAGNOSIS — E875 Hyperkalemia: Secondary | ICD-10-CM | POA: Diagnosis not present

## 2023-05-14 DIAGNOSIS — N2581 Secondary hyperparathyroidism of renal origin: Secondary | ICD-10-CM | POA: Diagnosis not present

## 2023-05-14 DIAGNOSIS — N2589 Other disorders resulting from impaired renal tubular function: Secondary | ICD-10-CM | POA: Diagnosis not present

## 2023-05-14 DIAGNOSIS — R82998 Other abnormal findings in urine: Secondary | ICD-10-CM | POA: Diagnosis not present

## 2023-05-15 DIAGNOSIS — Z992 Dependence on renal dialysis: Secondary | ICD-10-CM | POA: Diagnosis not present

## 2023-05-15 DIAGNOSIS — N186 End stage renal disease: Secondary | ICD-10-CM | POA: Diagnosis not present

## 2023-05-15 DIAGNOSIS — D631 Anemia in chronic kidney disease: Secondary | ICD-10-CM | POA: Diagnosis not present

## 2023-05-15 DIAGNOSIS — N2581 Secondary hyperparathyroidism of renal origin: Secondary | ICD-10-CM | POA: Diagnosis not present

## 2023-05-15 DIAGNOSIS — R82998 Other abnormal findings in urine: Secondary | ICD-10-CM | POA: Diagnosis not present

## 2023-05-15 DIAGNOSIS — E875 Hyperkalemia: Secondary | ICD-10-CM | POA: Diagnosis not present

## 2023-05-15 DIAGNOSIS — N2589 Other disorders resulting from impaired renal tubular function: Secondary | ICD-10-CM | POA: Diagnosis not present

## 2023-05-15 DIAGNOSIS — D509 Iron deficiency anemia, unspecified: Secondary | ICD-10-CM | POA: Diagnosis not present

## 2023-05-18 DIAGNOSIS — R82998 Other abnormal findings in urine: Secondary | ICD-10-CM | POA: Diagnosis not present

## 2023-05-18 DIAGNOSIS — N2581 Secondary hyperparathyroidism of renal origin: Secondary | ICD-10-CM | POA: Diagnosis not present

## 2023-05-18 DIAGNOSIS — N186 End stage renal disease: Secondary | ICD-10-CM | POA: Diagnosis not present

## 2023-05-18 DIAGNOSIS — D509 Iron deficiency anemia, unspecified: Secondary | ICD-10-CM | POA: Diagnosis not present

## 2023-05-18 DIAGNOSIS — E875 Hyperkalemia: Secondary | ICD-10-CM | POA: Diagnosis not present

## 2023-05-18 DIAGNOSIS — D631 Anemia in chronic kidney disease: Secondary | ICD-10-CM | POA: Diagnosis not present

## 2023-05-18 DIAGNOSIS — N2589 Other disorders resulting from impaired renal tubular function: Secondary | ICD-10-CM | POA: Diagnosis not present

## 2023-05-18 DIAGNOSIS — Z992 Dependence on renal dialysis: Secondary | ICD-10-CM | POA: Diagnosis not present

## 2023-05-21 DIAGNOSIS — E875 Hyperkalemia: Secondary | ICD-10-CM | POA: Diagnosis not present

## 2023-05-21 DIAGNOSIS — Z992 Dependence on renal dialysis: Secondary | ICD-10-CM | POA: Diagnosis not present

## 2023-05-21 DIAGNOSIS — N2581 Secondary hyperparathyroidism of renal origin: Secondary | ICD-10-CM | POA: Diagnosis not present

## 2023-05-21 DIAGNOSIS — N186 End stage renal disease: Secondary | ICD-10-CM | POA: Diagnosis not present

## 2023-05-21 DIAGNOSIS — D509 Iron deficiency anemia, unspecified: Secondary | ICD-10-CM | POA: Diagnosis not present

## 2023-05-21 DIAGNOSIS — D631 Anemia in chronic kidney disease: Secondary | ICD-10-CM | POA: Diagnosis not present

## 2023-05-21 DIAGNOSIS — R82998 Other abnormal findings in urine: Secondary | ICD-10-CM | POA: Diagnosis not present

## 2023-05-21 DIAGNOSIS — N2589 Other disorders resulting from impaired renal tubular function: Secondary | ICD-10-CM | POA: Diagnosis not present

## 2023-05-22 DIAGNOSIS — Z992 Dependence on renal dialysis: Secondary | ICD-10-CM | POA: Diagnosis not present

## 2023-05-22 DIAGNOSIS — E875 Hyperkalemia: Secondary | ICD-10-CM | POA: Diagnosis not present

## 2023-05-22 DIAGNOSIS — N186 End stage renal disease: Secondary | ICD-10-CM | POA: Diagnosis not present

## 2023-05-22 DIAGNOSIS — D509 Iron deficiency anemia, unspecified: Secondary | ICD-10-CM | POA: Diagnosis not present

## 2023-05-22 DIAGNOSIS — N2581 Secondary hyperparathyroidism of renal origin: Secondary | ICD-10-CM | POA: Diagnosis not present

## 2023-05-22 DIAGNOSIS — D631 Anemia in chronic kidney disease: Secondary | ICD-10-CM | POA: Diagnosis not present

## 2023-05-22 DIAGNOSIS — N2589 Other disorders resulting from impaired renal tubular function: Secondary | ICD-10-CM | POA: Diagnosis not present

## 2023-05-22 DIAGNOSIS — R82998 Other abnormal findings in urine: Secondary | ICD-10-CM | POA: Diagnosis not present

## 2023-05-23 DIAGNOSIS — N186 End stage renal disease: Secondary | ICD-10-CM | POA: Diagnosis not present

## 2023-05-23 DIAGNOSIS — Z992 Dependence on renal dialysis: Secondary | ICD-10-CM | POA: Diagnosis not present

## 2023-05-23 DIAGNOSIS — E875 Hyperkalemia: Secondary | ICD-10-CM | POA: Diagnosis not present

## 2023-05-23 DIAGNOSIS — N2581 Secondary hyperparathyroidism of renal origin: Secondary | ICD-10-CM | POA: Diagnosis not present

## 2023-05-23 DIAGNOSIS — N2589 Other disorders resulting from impaired renal tubular function: Secondary | ICD-10-CM | POA: Diagnosis not present

## 2023-05-23 DIAGNOSIS — D509 Iron deficiency anemia, unspecified: Secondary | ICD-10-CM | POA: Diagnosis not present

## 2023-05-23 DIAGNOSIS — R82998 Other abnormal findings in urine: Secondary | ICD-10-CM | POA: Diagnosis not present

## 2023-05-23 DIAGNOSIS — D631 Anemia in chronic kidney disease: Secondary | ICD-10-CM | POA: Diagnosis not present

## 2023-05-25 DIAGNOSIS — N186 End stage renal disease: Secondary | ICD-10-CM | POA: Diagnosis not present

## 2023-05-25 DIAGNOSIS — Z992 Dependence on renal dialysis: Secondary | ICD-10-CM | POA: Diagnosis not present

## 2023-05-25 DIAGNOSIS — E875 Hyperkalemia: Secondary | ICD-10-CM | POA: Diagnosis not present

## 2023-05-25 DIAGNOSIS — N2589 Other disorders resulting from impaired renal tubular function: Secondary | ICD-10-CM | POA: Diagnosis not present

## 2023-05-25 DIAGNOSIS — D631 Anemia in chronic kidney disease: Secondary | ICD-10-CM | POA: Diagnosis not present

## 2023-05-25 DIAGNOSIS — N2581 Secondary hyperparathyroidism of renal origin: Secondary | ICD-10-CM | POA: Diagnosis not present

## 2023-05-25 DIAGNOSIS — D509 Iron deficiency anemia, unspecified: Secondary | ICD-10-CM | POA: Diagnosis not present

## 2023-05-25 DIAGNOSIS — R82998 Other abnormal findings in urine: Secondary | ICD-10-CM | POA: Diagnosis not present

## 2023-05-26 DIAGNOSIS — N2581 Secondary hyperparathyroidism of renal origin: Secondary | ICD-10-CM | POA: Diagnosis not present

## 2023-05-26 DIAGNOSIS — N2589 Other disorders resulting from impaired renal tubular function: Secondary | ICD-10-CM | POA: Diagnosis not present

## 2023-05-26 DIAGNOSIS — D509 Iron deficiency anemia, unspecified: Secondary | ICD-10-CM | POA: Diagnosis not present

## 2023-05-26 DIAGNOSIS — D631 Anemia in chronic kidney disease: Secondary | ICD-10-CM | POA: Diagnosis not present

## 2023-05-26 DIAGNOSIS — R82998 Other abnormal findings in urine: Secondary | ICD-10-CM | POA: Diagnosis not present

## 2023-05-26 DIAGNOSIS — E875 Hyperkalemia: Secondary | ICD-10-CM | POA: Diagnosis not present

## 2023-05-26 DIAGNOSIS — Z992 Dependence on renal dialysis: Secondary | ICD-10-CM | POA: Diagnosis not present

## 2023-05-26 DIAGNOSIS — N186 End stage renal disease: Secondary | ICD-10-CM | POA: Diagnosis not present

## 2023-05-28 DIAGNOSIS — D631 Anemia in chronic kidney disease: Secondary | ICD-10-CM | POA: Diagnosis not present

## 2023-05-28 DIAGNOSIS — N186 End stage renal disease: Secondary | ICD-10-CM | POA: Diagnosis not present

## 2023-05-28 DIAGNOSIS — D509 Iron deficiency anemia, unspecified: Secondary | ICD-10-CM | POA: Diagnosis not present

## 2023-05-28 DIAGNOSIS — N2581 Secondary hyperparathyroidism of renal origin: Secondary | ICD-10-CM | POA: Diagnosis not present

## 2023-05-28 DIAGNOSIS — N2589 Other disorders resulting from impaired renal tubular function: Secondary | ICD-10-CM | POA: Diagnosis not present

## 2023-05-28 DIAGNOSIS — R82998 Other abnormal findings in urine: Secondary | ICD-10-CM | POA: Diagnosis not present

## 2023-05-28 DIAGNOSIS — Z992 Dependence on renal dialysis: Secondary | ICD-10-CM | POA: Diagnosis not present

## 2023-05-28 DIAGNOSIS — E875 Hyperkalemia: Secondary | ICD-10-CM | POA: Diagnosis not present

## 2023-05-29 DIAGNOSIS — Z992 Dependence on renal dialysis: Secondary | ICD-10-CM | POA: Diagnosis not present

## 2023-05-29 DIAGNOSIS — D631 Anemia in chronic kidney disease: Secondary | ICD-10-CM | POA: Diagnosis not present

## 2023-05-29 DIAGNOSIS — N2589 Other disorders resulting from impaired renal tubular function: Secondary | ICD-10-CM | POA: Diagnosis not present

## 2023-05-29 DIAGNOSIS — N186 End stage renal disease: Secondary | ICD-10-CM | POA: Diagnosis not present

## 2023-05-29 DIAGNOSIS — R82998 Other abnormal findings in urine: Secondary | ICD-10-CM | POA: Diagnosis not present

## 2023-05-29 DIAGNOSIS — E875 Hyperkalemia: Secondary | ICD-10-CM | POA: Diagnosis not present

## 2023-05-29 DIAGNOSIS — D509 Iron deficiency anemia, unspecified: Secondary | ICD-10-CM | POA: Diagnosis not present

## 2023-05-29 DIAGNOSIS — N2581 Secondary hyperparathyroidism of renal origin: Secondary | ICD-10-CM | POA: Diagnosis not present

## 2023-06-01 DIAGNOSIS — N049 Nephrotic syndrome with unspecified morphologic changes: Secondary | ICD-10-CM | POA: Diagnosis not present

## 2023-06-01 DIAGNOSIS — D509 Iron deficiency anemia, unspecified: Secondary | ICD-10-CM | POA: Diagnosis not present

## 2023-06-01 DIAGNOSIS — E875 Hyperkalemia: Secondary | ICD-10-CM | POA: Diagnosis not present

## 2023-06-01 DIAGNOSIS — Z992 Dependence on renal dialysis: Secondary | ICD-10-CM | POA: Diagnosis not present

## 2023-06-01 DIAGNOSIS — N2589 Other disorders resulting from impaired renal tubular function: Secondary | ICD-10-CM | POA: Diagnosis not present

## 2023-06-01 DIAGNOSIS — N2581 Secondary hyperparathyroidism of renal origin: Secondary | ICD-10-CM | POA: Diagnosis not present

## 2023-06-01 DIAGNOSIS — R82998 Other abnormal findings in urine: Secondary | ICD-10-CM | POA: Diagnosis not present

## 2023-06-01 DIAGNOSIS — D631 Anemia in chronic kidney disease: Secondary | ICD-10-CM | POA: Diagnosis not present

## 2023-06-01 DIAGNOSIS — N186 End stage renal disease: Secondary | ICD-10-CM | POA: Diagnosis not present

## 2023-06-02 DIAGNOSIS — R82998 Other abnormal findings in urine: Secondary | ICD-10-CM | POA: Diagnosis not present

## 2023-06-02 DIAGNOSIS — N186 End stage renal disease: Secondary | ICD-10-CM | POA: Diagnosis not present

## 2023-06-02 DIAGNOSIS — Z79899 Other long term (current) drug therapy: Secondary | ICD-10-CM | POA: Diagnosis not present

## 2023-06-02 DIAGNOSIS — D631 Anemia in chronic kidney disease: Secondary | ICD-10-CM | POA: Diagnosis not present

## 2023-06-02 DIAGNOSIS — E875 Hyperkalemia: Secondary | ICD-10-CM | POA: Diagnosis not present

## 2023-06-02 DIAGNOSIS — E440 Moderate protein-calorie malnutrition: Secondary | ICD-10-CM | POA: Diagnosis not present

## 2023-06-02 DIAGNOSIS — D509 Iron deficiency anemia, unspecified: Secondary | ICD-10-CM | POA: Diagnosis not present

## 2023-06-02 DIAGNOSIS — N2581 Secondary hyperparathyroidism of renal origin: Secondary | ICD-10-CM | POA: Diagnosis not present

## 2023-06-02 DIAGNOSIS — Z992 Dependence on renal dialysis: Secondary | ICD-10-CM | POA: Diagnosis not present

## 2023-06-02 DIAGNOSIS — R17 Unspecified jaundice: Secondary | ICD-10-CM | POA: Diagnosis not present

## 2023-06-04 DIAGNOSIS — Z992 Dependence on renal dialysis: Secondary | ICD-10-CM | POA: Diagnosis not present

## 2023-06-04 DIAGNOSIS — E875 Hyperkalemia: Secondary | ICD-10-CM | POA: Diagnosis not present

## 2023-06-04 DIAGNOSIS — E440 Moderate protein-calorie malnutrition: Secondary | ICD-10-CM | POA: Diagnosis not present

## 2023-06-04 DIAGNOSIS — D509 Iron deficiency anemia, unspecified: Secondary | ICD-10-CM | POA: Diagnosis not present

## 2023-06-04 DIAGNOSIS — D631 Anemia in chronic kidney disease: Secondary | ICD-10-CM | POA: Diagnosis not present

## 2023-06-04 DIAGNOSIS — R82998 Other abnormal findings in urine: Secondary | ICD-10-CM | POA: Diagnosis not present

## 2023-06-04 DIAGNOSIS — N2581 Secondary hyperparathyroidism of renal origin: Secondary | ICD-10-CM | POA: Diagnosis not present

## 2023-06-04 DIAGNOSIS — R17 Unspecified jaundice: Secondary | ICD-10-CM | POA: Diagnosis not present

## 2023-06-04 DIAGNOSIS — N186 End stage renal disease: Secondary | ICD-10-CM | POA: Diagnosis not present

## 2023-06-04 DIAGNOSIS — Z79899 Other long term (current) drug therapy: Secondary | ICD-10-CM | POA: Diagnosis not present

## 2023-06-05 ENCOUNTER — Encounter (HOSPITAL_COMMUNITY): Payer: Self-pay

## 2023-06-05 DIAGNOSIS — N186 End stage renal disease: Secondary | ICD-10-CM | POA: Diagnosis not present

## 2023-06-05 DIAGNOSIS — Z992 Dependence on renal dialysis: Secondary | ICD-10-CM | POA: Diagnosis not present

## 2023-06-05 DIAGNOSIS — E875 Hyperkalemia: Secondary | ICD-10-CM | POA: Diagnosis not present

## 2023-06-05 DIAGNOSIS — R82998 Other abnormal findings in urine: Secondary | ICD-10-CM | POA: Diagnosis not present

## 2023-06-05 DIAGNOSIS — R17 Unspecified jaundice: Secondary | ICD-10-CM | POA: Diagnosis not present

## 2023-06-05 DIAGNOSIS — N2581 Secondary hyperparathyroidism of renal origin: Secondary | ICD-10-CM | POA: Diagnosis not present

## 2023-06-05 DIAGNOSIS — D509 Iron deficiency anemia, unspecified: Secondary | ICD-10-CM | POA: Diagnosis not present

## 2023-06-05 DIAGNOSIS — D631 Anemia in chronic kidney disease: Secondary | ICD-10-CM | POA: Diagnosis not present

## 2023-06-05 DIAGNOSIS — E440 Moderate protein-calorie malnutrition: Secondary | ICD-10-CM | POA: Diagnosis not present

## 2023-06-05 DIAGNOSIS — Z79899 Other long term (current) drug therapy: Secondary | ICD-10-CM | POA: Diagnosis not present

## 2023-06-07 DIAGNOSIS — R82998 Other abnormal findings in urine: Secondary | ICD-10-CM | POA: Diagnosis not present

## 2023-06-07 DIAGNOSIS — N186 End stage renal disease: Secondary | ICD-10-CM | POA: Diagnosis not present

## 2023-06-07 DIAGNOSIS — E875 Hyperkalemia: Secondary | ICD-10-CM | POA: Diagnosis not present

## 2023-06-07 DIAGNOSIS — N2581 Secondary hyperparathyroidism of renal origin: Secondary | ICD-10-CM | POA: Diagnosis not present

## 2023-06-07 DIAGNOSIS — E440 Moderate protein-calorie malnutrition: Secondary | ICD-10-CM | POA: Diagnosis not present

## 2023-06-07 DIAGNOSIS — R17 Unspecified jaundice: Secondary | ICD-10-CM | POA: Diagnosis not present

## 2023-06-07 DIAGNOSIS — D509 Iron deficiency anemia, unspecified: Secondary | ICD-10-CM | POA: Diagnosis not present

## 2023-06-07 DIAGNOSIS — D631 Anemia in chronic kidney disease: Secondary | ICD-10-CM | POA: Diagnosis not present

## 2023-06-07 DIAGNOSIS — Z992 Dependence on renal dialysis: Secondary | ICD-10-CM | POA: Diagnosis not present

## 2023-06-07 DIAGNOSIS — Z79899 Other long term (current) drug therapy: Secondary | ICD-10-CM | POA: Diagnosis not present

## 2023-06-08 ENCOUNTER — Encounter (HOSPITAL_COMMUNITY): Payer: Self-pay | Admitting: Nephrology

## 2023-06-08 ENCOUNTER — Ambulatory Visit (HOSPITAL_COMMUNITY)
Admission: RE | Admit: 2023-06-08 | Discharge: 2023-06-08 | Disposition: A | Discharge status: Home / Self Care | Attending: Nephrology | Admitting: Nephrology

## 2023-06-08 ENCOUNTER — Encounter (HOSPITAL_COMMUNITY)
Admission: RE | Disposition: A | Discharge status: Home / Self Care | Payer: Self-pay | Source: Home / Self Care | Attending: Nephrology

## 2023-06-08 ENCOUNTER — Other Ambulatory Visit: Payer: Self-pay

## 2023-06-08 DIAGNOSIS — T82510A Breakdown (mechanical) of surgically created arteriovenous fistula, initial encounter: Secondary | ICD-10-CM | POA: Insufficient documentation

## 2023-06-08 DIAGNOSIS — I120 Hypertensive chronic kidney disease with stage 5 chronic kidney disease or end stage renal disease: Secondary | ICD-10-CM | POA: Diagnosis not present

## 2023-06-08 DIAGNOSIS — Y832 Surgical operation with anastomosis, bypass or graft as the cause of abnormal reaction of the patient, or of later complication, without mention of misadventure at the time of the procedure: Secondary | ICD-10-CM | POA: Diagnosis not present

## 2023-06-08 DIAGNOSIS — Z992 Dependence on renal dialysis: Secondary | ICD-10-CM | POA: Insufficient documentation

## 2023-06-08 DIAGNOSIS — T82858A Stenosis of vascular prosthetic devices, implants and grafts, initial encounter: Secondary | ICD-10-CM | POA: Insufficient documentation

## 2023-06-08 DIAGNOSIS — N186 End stage renal disease: Secondary | ICD-10-CM | POA: Insufficient documentation

## 2023-06-08 DIAGNOSIS — D631 Anemia in chronic kidney disease: Secondary | ICD-10-CM | POA: Diagnosis not present

## 2023-06-08 DIAGNOSIS — Z8249 Family history of ischemic heart disease and other diseases of the circulatory system: Secondary | ICD-10-CM | POA: Insufficient documentation

## 2023-06-08 DIAGNOSIS — I871 Compression of vein: Secondary | ICD-10-CM | POA: Diagnosis not present

## 2023-06-08 HISTORY — PX: VENOUS ANGIOPLASTY: CATH118376

## 2023-06-08 HISTORY — PX: A/V SHUNT INTERVENTION: CATH118220

## 2023-06-08 SURGERY — A/V SHUNT INTERVENTION
Anesthesia: LOCAL

## 2023-06-08 MED ORDER — HEPARIN (PORCINE) IN NACL 1000-0.9 UT/500ML-% IV SOLN
INTRAVENOUS | Status: DC | PRN
  Administered 2023-06-08: 500 mL

## 2023-06-08 MED ORDER — MIDAZOLAM HCL 2 MG/2ML IJ SOLN
INTRAMUSCULAR | Status: AC
  Filled 2023-06-08: qty 2

## 2023-06-08 MED ORDER — SODIUM CHLORIDE 0.9 % IV SOLN: INTRAVENOUS | Status: DC

## 2023-06-08 MED ORDER — FENTANYL CITRATE (PF) 100 MCG/2ML IJ SOLN
INTRAMUSCULAR | Status: DC | PRN
  Administered 2023-06-08: 25 ug via INTRAVENOUS

## 2023-06-08 MED ORDER — FENTANYL CITRATE (PF) 100 MCG/2ML IJ SOLN
INTRAMUSCULAR | Status: AC
  Filled 2023-06-08: qty 2

## 2023-06-08 MED ORDER — LIDOCAINE HCL (PF) 1 % IJ SOLN
INTRAMUSCULAR | Status: DC | PRN
  Administered 2023-06-08: 2 mL via SUBCUTANEOUS

## 2023-06-08 MED ORDER — MIDAZOLAM HCL 2 MG/2ML IJ SOLN
INTRAMUSCULAR | Status: DC | PRN
  Administered 2023-06-08: 2 mg via INTRAVENOUS

## 2023-06-08 MED ORDER — IODIXANOL 320 MG/ML IV SOLN
INTRAVENOUS | Status: DC | PRN
  Administered 2023-06-08: 10 mL via INTRAVENOUS

## 2023-06-08 MED ORDER — LIDOCAINE HCL (PF) 1 % IJ SOLN
INTRAMUSCULAR | Status: AC
  Filled 2023-06-08: qty 30

## 2023-06-08 SURGICAL SUPPLY — 10 items
BAG SNAP BAND KOVER 36X36 (MISCELLANEOUS) ×2 IMPLANT
BALLN ATHLETIS 9X40X75 (BALLOONS) ×2 IMPLANT
BALLOON ATHLETIS 9X40X75 (BALLOONS) IMPLANT
COVER DOME SNAP 22 D (MISCELLANEOUS) ×2 IMPLANT
GUIDEWIRE ANGLED .035 180CM (WIRE) IMPLANT
SHEATH PINNACLE R/O II 5F 6CM (SHEATH) IMPLANT
SHEATH PINNACLE R/O II 7F 4CM (SHEATH) IMPLANT
SYR MEDALLION 10ML (SYRINGE) IMPLANT
SYR MEDALLION 3ML (SYRINGE) IMPLANT
TRAY PV CATH (CUSTOM PROCEDURE TRAY) ×2 IMPLANT

## 2023-06-08 NOTE — Op Note (Signed)
 Patient presents with concerns of recirculation/decreased clearance of his left radiocephalic fistula (placed December 2021). His last procedure was >6 months ago. 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 Augmentation is normal. No signs of impending rupture were seen over the aneurysms.   Summary:  1)      The patient had successful angioplasty (9 mm Athletis FE ~24 atm) of significant stenosis in the outflow cephalic vein in the upper forearm.  2)      The body of the radiocephalic fistula, inflow, and arterial anastomosis were patent with good flows. 3)      The upper arm outflow is predominantly through patent median veins with patent axillary and left central veins. 4)      This left RCF remains amenable to future percutaneous intervention.  Description of procedure: The arm was prepped and draped in the usual sterile fashion. The left radial-cephalic fistula was cannulated (57846) with an 18G needle directed in an antegrade direction. A guidewire was inserted and exchanged for a 7Fr sheath. Contrast [id number]) injection via the side port of the sheath was performed. The angiogram of the fistula (28413) showed a 2 aneurysms in the body of the left RCF without intra aneurysmal stenosis, with a focal 70% outflow cephalic vein stenosis in the upper forearm.  Upper arm venous outflow is through patent median veins, patent axillary and left central veins. The inflow cephalic vein and arterial anastomosis were patent.  The wire was advanced centrally without any difficulty. A 9 x 40mm Athletis angioplasty balloon was inserted over a glide wire and positioned at the outflow cephalic vein stenosis. Venous angioplasty (24401) was carried out to 24 ATM with FULL effacement of the waist on the balloon.  Repeat angiogram showed 10% residual at the  site of angioplasty with no evidence of extravasation.  The flow of contrast was quicker and the fistula was markedly less pulsatile.  Hemostasis: A 3-0 ethilon purse string suture was placed at the cannulation site on removal of the sheath.  Sedation: 2 mg Versed, 25 mcg Fentanyl. Sedation time.  11 minutes  Contrast.  10 mL  Monitoring: Because of the patient's comorbid conditions and sedation during the procedure, continuous EKG monitoring and O2 saturation monitoring was performed throughout the procedure by the RN. There were no abnormal arrhythmias encountered.  Complications: None.   Diagnoses: I87.1 Stricture of vein  N18.6 ESRD T82.858A Stricture of access  Procedure Coding:  [phone] Cannulation and angiogram of fistula, venous angioplasty (outflow cephalic vein, upper forearm) D6644 Contrast  Recommendations:  1. Continue to cannulate the fistula with 15G needles.  2. Refer back for problems with flows. 3. Remove the suture next treatment.   Discharge: The patient was discharged home in stable condition. The patient was given education regarding the care of the dialysis access AVF and specific instructions in case of any problems.

## 2023-06-08 NOTE — H&P (Signed)
 Chief Complaint: Recirculation, decreased clearance HPI:  53 year old man with past medical history significant for hypertension, dyslipidemia, obesity, sleep apnea and end-stage renal disease on hemodialysis who is on home hemodialysis via his left radiocephalic fistula that was created in December 2021.  He has recently been having some problems with cannulation and suspicion of recirculation with hyperkalemia and decreased clearance.  He denies any steal symptoms.  He denies any constitutional complaints including fevers or chills and has not had any recent chest pain or shortness of breath.  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   Sleep apnea    does not use cpap   Vitamin D deficiency     Past Surgical History:  Procedure Laterality Date   AV FISTULA PLACEMENT Left 02/09/2020   Procedure: LEFT ARM FISTULA CREATION;  Surgeon: Nada Libman, MD;  Location: [facility] OR;  Service: Vascular;  Laterality: Left;   COLONOSCOPY WITH PROPOFOL N/A 02/10/2022   Procedure: COLONOSCOPY WITH PROPOFOL;  Surgeon: Benancio Deeds, MD;  Location: [facility] ENDOSCOPY;  Service: Gastroenterology;  Laterality: N/A;   INSERTION OF DIALYSIS CATHETER Right 05/22/2020   Procedure: INSERTION OF TUNNELED DIALYSIS CATHETER;  Surgeon: Chuck Hint, MD;  Location: [facility] OR;  Service: Vascular;  Laterality: Right;   MENISCUS REPAIR Right 12/14    POLYPECTOMY  02/10/2022   Procedure: POLYPECTOMY;  Surgeon: Benancio Deeds, MD;  Location: [facility] ENDOSCOPY;  Service: Gastroenterology;;   RADIOLOGY WITH ANESTHESIA N/A 04/03/2022   Procedure: MRI THORASIC SPINE WITH ANESTHESIA;  Surgeon: Radiologist, Medication, MD;  Location: [facility] OR;  Service: Radiology;  Laterality: N/A;    Family History  Problem Relation Age of Onset   Prostate cancer Father    Diabetes Father    Drug abuse Father    Hyperlipidemia Sister    Stomach cancer Maternal Grandmother    Diabetes Maternal Grandfather    Heart disease Maternal Grandfather    Social History:  reports that he has never smoked. He has never used smokeless tobacco. He reports that he does not drink alcohol and does not use drugs.  Allergies: No Known Allergies  Medications Prior to Admission  Medication Sig Dispense Refill   acetaminophen (TYLENOL) 500 MG tablet Take 1,000 mg by mouth every 6 (six) hours as needed for headache.     calcitRIOL (ROCALTROL) 0.25 MCG capsule Take 1 capsule (0.25 mcg total) by mouth in the morning. 90 capsule 1   calcium acetate (PHOSLO) 667 MG capsule Take 2,668 mg by mouth See admin instructions. Take  4 capsules ( 2668 mg) by mouth with each meal and with each snacks     cinacalcet (SENSIPAR) 30 MG tablet Take 30 mg by mouth daily.     hydrALAZINE (APRESOLINE) 100 MG tablet Take 1 tablet (100 mg total) by mouth 2 (two) times daily. 180 tablet 1  metoprolol succinate (TOPROL-XL) 50 MG 24 hr tablet Take 50 mg by mouth daily.     Multiple Vitamins-Minerals (MULTIVITAMIN MEN 50+ PO) Take 1 tablet by mouth daily.     NIFEdipine (PROCARDIA XL/NIFEDICAL-XL) 90 MG 24 hr tablet TAKE 1 TABLET(90 MG) BY MOUTH AT BEDTIME 90 tablet 1   olmesartan (BENICAR) 40 MG tablet Take 1 tablet (40 mg total) by mouth at bedtime. 90 tablet 1    No results found for this or any previous visit (from the past 48 hours). No results found.  Review of Systems  Blood pressure (!)  138/90, pulse 98, temperature 98.3 F (36.8 C), temperature source Oral, SpO2 95%. Physical Exam Vitals and nursing note reviewed.  Constitutional:      Appearance: Normal appearance. He is obese.  HENT:     Head: Normocephalic and atraumatic.     Nose: Nose normal.     Mouth/Throat:     Mouth: Mucous membranes are dry.     Pharynx: Oropharynx is clear.  Eyes:     Extraocular Movements: Extraocular movements intact.     Conjunctiva/sclera: Conjunctivae normal.  Cardiovascular:     Rate and Rhythm: Normal rate and regular rhythm.  Pulmonary:     Effort: Pulmonary effort is normal.     Breath sounds: Normal breath sounds.  Musculoskeletal:     Comments: Left radiocephalic fistula with aneurysmal cannulation zone (no signs of impending rupture) that has buttonholes established.  Fistula hyperpulsatile over cannulation zone with fair outflow thrill  Neurological:     Mental Status: He is alert and oriented to person, place, and time. Mental status is at baseline.      Assessment/Plan 1.  Recirculation/decreased adequacy: Undertake fistulogram to evaluate for possible circuit stenosis and offer management with angioplasty if indicated.  Procedure explained to the patient and after weighing risks and benefits, he consents to proceed. 2.  End-stage renal disease: Resume hemodialysis at home per outpatient schedule following completion of procedure today. 3.  Anemia of chronic disease: Without overt loss, resume H/H monitoring and ESA per protocol for home therapies. 4.  Hypertension: Elevated blood pressure noted during check-in, will be monitored with moderate sedation during procedure.  Dagoberto Ligas, MD 06/08/2023, 9:01 AM

## 2023-06-09 DIAGNOSIS — N186 End stage renal disease: Secondary | ICD-10-CM | POA: Diagnosis not present

## 2023-06-09 DIAGNOSIS — D631 Anemia in chronic kidney disease: Secondary | ICD-10-CM | POA: Diagnosis not present

## 2023-06-09 DIAGNOSIS — Z79899 Other long term (current) drug therapy: Secondary | ICD-10-CM | POA: Diagnosis not present

## 2023-06-09 DIAGNOSIS — D509 Iron deficiency anemia, unspecified: Secondary | ICD-10-CM | POA: Diagnosis not present

## 2023-06-09 DIAGNOSIS — R17 Unspecified jaundice: Secondary | ICD-10-CM | POA: Diagnosis not present

## 2023-06-09 DIAGNOSIS — N2581 Secondary hyperparathyroidism of renal origin: Secondary | ICD-10-CM | POA: Diagnosis not present

## 2023-06-09 DIAGNOSIS — Z992 Dependence on renal dialysis: Secondary | ICD-10-CM | POA: Diagnosis not present

## 2023-06-09 DIAGNOSIS — E875 Hyperkalemia: Secondary | ICD-10-CM | POA: Diagnosis not present

## 2023-06-09 DIAGNOSIS — R82998 Other abnormal findings in urine: Secondary | ICD-10-CM | POA: Diagnosis not present

## 2023-06-09 DIAGNOSIS — E440 Moderate protein-calorie malnutrition: Secondary | ICD-10-CM | POA: Diagnosis not present

## 2023-06-10 DIAGNOSIS — D631 Anemia in chronic kidney disease: Secondary | ICD-10-CM | POA: Diagnosis not present

## 2023-06-10 DIAGNOSIS — E440 Moderate protein-calorie malnutrition: Secondary | ICD-10-CM | POA: Diagnosis not present

## 2023-06-10 DIAGNOSIS — Z79899 Other long term (current) drug therapy: Secondary | ICD-10-CM | POA: Diagnosis not present

## 2023-06-10 DIAGNOSIS — N2581 Secondary hyperparathyroidism of renal origin: Secondary | ICD-10-CM | POA: Diagnosis not present

## 2023-06-10 DIAGNOSIS — N186 End stage renal disease: Secondary | ICD-10-CM | POA: Diagnosis not present

## 2023-06-10 DIAGNOSIS — E875 Hyperkalemia: Secondary | ICD-10-CM | POA: Diagnosis not present

## 2023-06-10 DIAGNOSIS — Z992 Dependence on renal dialysis: Secondary | ICD-10-CM | POA: Diagnosis not present

## 2023-06-10 DIAGNOSIS — R17 Unspecified jaundice: Secondary | ICD-10-CM | POA: Diagnosis not present

## 2023-06-10 DIAGNOSIS — D509 Iron deficiency anemia, unspecified: Secondary | ICD-10-CM | POA: Diagnosis not present

## 2023-06-10 DIAGNOSIS — R82998 Other abnormal findings in urine: Secondary | ICD-10-CM | POA: Diagnosis not present

## 2023-06-11 DIAGNOSIS — E440 Moderate protein-calorie malnutrition: Secondary | ICD-10-CM | POA: Diagnosis not present

## 2023-06-11 DIAGNOSIS — N186 End stage renal disease: Secondary | ICD-10-CM | POA: Diagnosis not present

## 2023-06-11 DIAGNOSIS — Z992 Dependence on renal dialysis: Secondary | ICD-10-CM | POA: Diagnosis not present

## 2023-06-11 DIAGNOSIS — R82998 Other abnormal findings in urine: Secondary | ICD-10-CM | POA: Diagnosis not present

## 2023-06-11 DIAGNOSIS — Z79899 Other long term (current) drug therapy: Secondary | ICD-10-CM | POA: Diagnosis not present

## 2023-06-11 DIAGNOSIS — D631 Anemia in chronic kidney disease: Secondary | ICD-10-CM | POA: Diagnosis not present

## 2023-06-11 DIAGNOSIS — N2581 Secondary hyperparathyroidism of renal origin: Secondary | ICD-10-CM | POA: Diagnosis not present

## 2023-06-11 DIAGNOSIS — E875 Hyperkalemia: Secondary | ICD-10-CM | POA: Diagnosis not present

## 2023-06-11 DIAGNOSIS — R17 Unspecified jaundice: Secondary | ICD-10-CM | POA: Diagnosis not present

## 2023-06-11 DIAGNOSIS — D509 Iron deficiency anemia, unspecified: Secondary | ICD-10-CM | POA: Diagnosis not present

## 2023-06-12 DIAGNOSIS — D509 Iron deficiency anemia, unspecified: Secondary | ICD-10-CM | POA: Diagnosis not present

## 2023-06-12 DIAGNOSIS — N186 End stage renal disease: Secondary | ICD-10-CM | POA: Diagnosis not present

## 2023-06-12 DIAGNOSIS — R17 Unspecified jaundice: Secondary | ICD-10-CM | POA: Diagnosis not present

## 2023-06-12 DIAGNOSIS — E875 Hyperkalemia: Secondary | ICD-10-CM | POA: Diagnosis not present

## 2023-06-12 DIAGNOSIS — E440 Moderate protein-calorie malnutrition: Secondary | ICD-10-CM | POA: Diagnosis not present

## 2023-06-12 DIAGNOSIS — Z992 Dependence on renal dialysis: Secondary | ICD-10-CM | POA: Diagnosis not present

## 2023-06-12 DIAGNOSIS — R82998 Other abnormal findings in urine: Secondary | ICD-10-CM | POA: Diagnosis not present

## 2023-06-12 DIAGNOSIS — Z79899 Other long term (current) drug therapy: Secondary | ICD-10-CM | POA: Diagnosis not present

## 2023-06-12 DIAGNOSIS — D631 Anemia in chronic kidney disease: Secondary | ICD-10-CM | POA: Diagnosis not present

## 2023-06-12 DIAGNOSIS — N2581 Secondary hyperparathyroidism of renal origin: Secondary | ICD-10-CM | POA: Diagnosis not present

## 2023-06-14 DIAGNOSIS — Z992 Dependence on renal dialysis: Secondary | ICD-10-CM | POA: Diagnosis not present

## 2023-06-14 DIAGNOSIS — D509 Iron deficiency anemia, unspecified: Secondary | ICD-10-CM | POA: Diagnosis not present

## 2023-06-14 DIAGNOSIS — D631 Anemia in chronic kidney disease: Secondary | ICD-10-CM | POA: Diagnosis not present

## 2023-06-14 DIAGNOSIS — N186 End stage renal disease: Secondary | ICD-10-CM | POA: Diagnosis not present

## 2023-06-14 DIAGNOSIS — E875 Hyperkalemia: Secondary | ICD-10-CM | POA: Diagnosis not present

## 2023-06-14 DIAGNOSIS — E440 Moderate protein-calorie malnutrition: Secondary | ICD-10-CM | POA: Diagnosis not present

## 2023-06-14 DIAGNOSIS — R82998 Other abnormal findings in urine: Secondary | ICD-10-CM | POA: Diagnosis not present

## 2023-06-14 DIAGNOSIS — Z79899 Other long term (current) drug therapy: Secondary | ICD-10-CM | POA: Diagnosis not present

## 2023-06-14 DIAGNOSIS — R17 Unspecified jaundice: Secondary | ICD-10-CM | POA: Diagnosis not present

## 2023-06-14 DIAGNOSIS — N2581 Secondary hyperparathyroidism of renal origin: Secondary | ICD-10-CM | POA: Diagnosis not present

## 2023-06-15 DIAGNOSIS — E875 Hyperkalemia: Secondary | ICD-10-CM | POA: Diagnosis not present

## 2023-06-15 DIAGNOSIS — R82998 Other abnormal findings in urine: Secondary | ICD-10-CM | POA: Diagnosis not present

## 2023-06-15 DIAGNOSIS — D509 Iron deficiency anemia, unspecified: Secondary | ICD-10-CM | POA: Diagnosis not present

## 2023-06-15 DIAGNOSIS — R17 Unspecified jaundice: Secondary | ICD-10-CM | POA: Diagnosis not present

## 2023-06-15 DIAGNOSIS — E440 Moderate protein-calorie malnutrition: Secondary | ICD-10-CM | POA: Diagnosis not present

## 2023-06-15 DIAGNOSIS — Z992 Dependence on renal dialysis: Secondary | ICD-10-CM | POA: Diagnosis not present

## 2023-06-15 DIAGNOSIS — N186 End stage renal disease: Secondary | ICD-10-CM | POA: Diagnosis not present

## 2023-06-15 DIAGNOSIS — D631 Anemia in chronic kidney disease: Secondary | ICD-10-CM | POA: Diagnosis not present

## 2023-06-15 DIAGNOSIS — Z79899 Other long term (current) drug therapy: Secondary | ICD-10-CM | POA: Diagnosis not present

## 2023-06-15 DIAGNOSIS — N2581 Secondary hyperparathyroidism of renal origin: Secondary | ICD-10-CM | POA: Diagnosis not present

## 2023-06-16 DIAGNOSIS — Z79899 Other long term (current) drug therapy: Secondary | ICD-10-CM | POA: Diagnosis not present

## 2023-06-16 DIAGNOSIS — R17 Unspecified jaundice: Secondary | ICD-10-CM | POA: Diagnosis not present

## 2023-06-16 DIAGNOSIS — N2581 Secondary hyperparathyroidism of renal origin: Secondary | ICD-10-CM | POA: Diagnosis not present

## 2023-06-16 DIAGNOSIS — E440 Moderate protein-calorie malnutrition: Secondary | ICD-10-CM | POA: Diagnosis not present

## 2023-06-16 DIAGNOSIS — E875 Hyperkalemia: Secondary | ICD-10-CM | POA: Diagnosis not present

## 2023-06-16 DIAGNOSIS — R82998 Other abnormal findings in urine: Secondary | ICD-10-CM | POA: Diagnosis not present

## 2023-06-16 DIAGNOSIS — Z992 Dependence on renal dialysis: Secondary | ICD-10-CM | POA: Diagnosis not present

## 2023-06-16 DIAGNOSIS — D631 Anemia in chronic kidney disease: Secondary | ICD-10-CM | POA: Diagnosis not present

## 2023-06-16 DIAGNOSIS — D509 Iron deficiency anemia, unspecified: Secondary | ICD-10-CM | POA: Diagnosis not present

## 2023-06-16 DIAGNOSIS — N186 End stage renal disease: Secondary | ICD-10-CM | POA: Diagnosis not present

## 2023-06-18 DIAGNOSIS — N2581 Secondary hyperparathyroidism of renal origin: Secondary | ICD-10-CM | POA: Diagnosis not present

## 2023-06-18 DIAGNOSIS — Z992 Dependence on renal dialysis: Secondary | ICD-10-CM | POA: Diagnosis not present

## 2023-06-18 DIAGNOSIS — R82998 Other abnormal findings in urine: Secondary | ICD-10-CM | POA: Diagnosis not present

## 2023-06-18 DIAGNOSIS — D509 Iron deficiency anemia, unspecified: Secondary | ICD-10-CM | POA: Diagnosis not present

## 2023-06-18 DIAGNOSIS — Z79899 Other long term (current) drug therapy: Secondary | ICD-10-CM | POA: Diagnosis not present

## 2023-06-18 DIAGNOSIS — E440 Moderate protein-calorie malnutrition: Secondary | ICD-10-CM | POA: Diagnosis not present

## 2023-06-18 DIAGNOSIS — R17 Unspecified jaundice: Secondary | ICD-10-CM | POA: Diagnosis not present

## 2023-06-18 DIAGNOSIS — D631 Anemia in chronic kidney disease: Secondary | ICD-10-CM | POA: Diagnosis not present

## 2023-06-18 DIAGNOSIS — N186 End stage renal disease: Secondary | ICD-10-CM | POA: Diagnosis not present

## 2023-06-18 DIAGNOSIS — E875 Hyperkalemia: Secondary | ICD-10-CM | POA: Diagnosis not present

## 2023-06-19 DIAGNOSIS — E875 Hyperkalemia: Secondary | ICD-10-CM | POA: Diagnosis not present

## 2023-06-19 DIAGNOSIS — Z992 Dependence on renal dialysis: Secondary | ICD-10-CM | POA: Diagnosis not present

## 2023-06-19 DIAGNOSIS — E440 Moderate protein-calorie malnutrition: Secondary | ICD-10-CM | POA: Diagnosis not present

## 2023-06-19 DIAGNOSIS — N186 End stage renal disease: Secondary | ICD-10-CM | POA: Diagnosis not present

## 2023-06-19 DIAGNOSIS — D509 Iron deficiency anemia, unspecified: Secondary | ICD-10-CM | POA: Diagnosis not present

## 2023-06-19 DIAGNOSIS — Z79899 Other long term (current) drug therapy: Secondary | ICD-10-CM | POA: Diagnosis not present

## 2023-06-19 DIAGNOSIS — D631 Anemia in chronic kidney disease: Secondary | ICD-10-CM | POA: Diagnosis not present

## 2023-06-19 DIAGNOSIS — N2581 Secondary hyperparathyroidism of renal origin: Secondary | ICD-10-CM | POA: Diagnosis not present

## 2023-06-19 DIAGNOSIS — R17 Unspecified jaundice: Secondary | ICD-10-CM | POA: Diagnosis not present

## 2023-06-19 DIAGNOSIS — R82998 Other abnormal findings in urine: Secondary | ICD-10-CM | POA: Diagnosis not present

## 2023-06-22 DIAGNOSIS — E440 Moderate protein-calorie malnutrition: Secondary | ICD-10-CM | POA: Diagnosis not present

## 2023-06-22 DIAGNOSIS — E875 Hyperkalemia: Secondary | ICD-10-CM | POA: Diagnosis not present

## 2023-06-22 DIAGNOSIS — R17 Unspecified jaundice: Secondary | ICD-10-CM | POA: Diagnosis not present

## 2023-06-22 DIAGNOSIS — R82998 Other abnormal findings in urine: Secondary | ICD-10-CM | POA: Diagnosis not present

## 2023-06-22 DIAGNOSIS — Z992 Dependence on renal dialysis: Secondary | ICD-10-CM | POA: Diagnosis not present

## 2023-06-22 DIAGNOSIS — D509 Iron deficiency anemia, unspecified: Secondary | ICD-10-CM | POA: Diagnosis not present

## 2023-06-22 DIAGNOSIS — Z79899 Other long term (current) drug therapy: Secondary | ICD-10-CM | POA: Diagnosis not present

## 2023-06-22 DIAGNOSIS — N2581 Secondary hyperparathyroidism of renal origin: Secondary | ICD-10-CM | POA: Diagnosis not present

## 2023-06-22 DIAGNOSIS — D631 Anemia in chronic kidney disease: Secondary | ICD-10-CM | POA: Diagnosis not present

## 2023-06-22 DIAGNOSIS — N186 End stage renal disease: Secondary | ICD-10-CM | POA: Diagnosis not present

## 2023-06-23 DIAGNOSIS — N2581 Secondary hyperparathyroidism of renal origin: Secondary | ICD-10-CM | POA: Diagnosis not present

## 2023-06-23 DIAGNOSIS — Z79899 Other long term (current) drug therapy: Secondary | ICD-10-CM | POA: Diagnosis not present

## 2023-06-23 DIAGNOSIS — N186 End stage renal disease: Secondary | ICD-10-CM | POA: Diagnosis not present

## 2023-06-23 DIAGNOSIS — R17 Unspecified jaundice: Secondary | ICD-10-CM | POA: Diagnosis not present

## 2023-06-23 DIAGNOSIS — E875 Hyperkalemia: Secondary | ICD-10-CM | POA: Diagnosis not present

## 2023-06-23 DIAGNOSIS — Z992 Dependence on renal dialysis: Secondary | ICD-10-CM | POA: Diagnosis not present

## 2023-06-23 DIAGNOSIS — R82998 Other abnormal findings in urine: Secondary | ICD-10-CM | POA: Diagnosis not present

## 2023-06-23 DIAGNOSIS — D509 Iron deficiency anemia, unspecified: Secondary | ICD-10-CM | POA: Diagnosis not present

## 2023-06-23 DIAGNOSIS — D631 Anemia in chronic kidney disease: Secondary | ICD-10-CM | POA: Diagnosis not present

## 2023-06-23 DIAGNOSIS — E440 Moderate protein-calorie malnutrition: Secondary | ICD-10-CM | POA: Diagnosis not present

## 2023-06-25 DIAGNOSIS — Z79899 Other long term (current) drug therapy: Secondary | ICD-10-CM | POA: Diagnosis not present

## 2023-06-25 DIAGNOSIS — N2581 Secondary hyperparathyroidism of renal origin: Secondary | ICD-10-CM | POA: Diagnosis not present

## 2023-06-25 DIAGNOSIS — N186 End stage renal disease: Secondary | ICD-10-CM | POA: Diagnosis not present

## 2023-06-25 DIAGNOSIS — R82998 Other abnormal findings in urine: Secondary | ICD-10-CM | POA: Diagnosis not present

## 2023-06-25 DIAGNOSIS — R17 Unspecified jaundice: Secondary | ICD-10-CM | POA: Diagnosis not present

## 2023-06-25 DIAGNOSIS — E440 Moderate protein-calorie malnutrition: Secondary | ICD-10-CM | POA: Diagnosis not present

## 2023-06-25 DIAGNOSIS — D631 Anemia in chronic kidney disease: Secondary | ICD-10-CM | POA: Diagnosis not present

## 2023-06-25 DIAGNOSIS — Z992 Dependence on renal dialysis: Secondary | ICD-10-CM | POA: Diagnosis not present

## 2023-06-25 DIAGNOSIS — D509 Iron deficiency anemia, unspecified: Secondary | ICD-10-CM | POA: Diagnosis not present

## 2023-06-25 DIAGNOSIS — E875 Hyperkalemia: Secondary | ICD-10-CM | POA: Diagnosis not present

## 2023-06-26 DIAGNOSIS — E875 Hyperkalemia: Secondary | ICD-10-CM | POA: Diagnosis not present

## 2023-06-26 DIAGNOSIS — N2581 Secondary hyperparathyroidism of renal origin: Secondary | ICD-10-CM | POA: Diagnosis not present

## 2023-06-26 DIAGNOSIS — E440 Moderate protein-calorie malnutrition: Secondary | ICD-10-CM | POA: Diagnosis not present

## 2023-06-26 DIAGNOSIS — D509 Iron deficiency anemia, unspecified: Secondary | ICD-10-CM | POA: Diagnosis not present

## 2023-06-26 DIAGNOSIS — R82998 Other abnormal findings in urine: Secondary | ICD-10-CM | POA: Diagnosis not present

## 2023-06-26 DIAGNOSIS — N186 End stage renal disease: Secondary | ICD-10-CM | POA: Diagnosis not present

## 2023-06-26 DIAGNOSIS — Z992 Dependence on renal dialysis: Secondary | ICD-10-CM | POA: Diagnosis not present

## 2023-06-26 DIAGNOSIS — R17 Unspecified jaundice: Secondary | ICD-10-CM | POA: Diagnosis not present

## 2023-06-26 DIAGNOSIS — D631 Anemia in chronic kidney disease: Secondary | ICD-10-CM | POA: Diagnosis not present

## 2023-06-26 DIAGNOSIS — Z79899 Other long term (current) drug therapy: Secondary | ICD-10-CM | POA: Diagnosis not present

## 2023-06-29 DIAGNOSIS — E440 Moderate protein-calorie malnutrition: Secondary | ICD-10-CM | POA: Diagnosis not present

## 2023-06-29 DIAGNOSIS — Z79899 Other long term (current) drug therapy: Secondary | ICD-10-CM | POA: Diagnosis not present

## 2023-06-29 DIAGNOSIS — R17 Unspecified jaundice: Secondary | ICD-10-CM | POA: Diagnosis not present

## 2023-06-29 DIAGNOSIS — N186 End stage renal disease: Secondary | ICD-10-CM | POA: Diagnosis not present

## 2023-06-29 DIAGNOSIS — N2581 Secondary hyperparathyroidism of renal origin: Secondary | ICD-10-CM | POA: Diagnosis not present

## 2023-06-29 DIAGNOSIS — E875 Hyperkalemia: Secondary | ICD-10-CM | POA: Diagnosis not present

## 2023-06-29 DIAGNOSIS — R82998 Other abnormal findings in urine: Secondary | ICD-10-CM | POA: Diagnosis not present

## 2023-06-29 DIAGNOSIS — D631 Anemia in chronic kidney disease: Secondary | ICD-10-CM | POA: Diagnosis not present

## 2023-06-29 DIAGNOSIS — D509 Iron deficiency anemia, unspecified: Secondary | ICD-10-CM | POA: Diagnosis not present

## 2023-06-29 DIAGNOSIS — Z992 Dependence on renal dialysis: Secondary | ICD-10-CM | POA: Diagnosis not present

## 2023-06-30 DIAGNOSIS — N186 End stage renal disease: Secondary | ICD-10-CM | POA: Diagnosis not present

## 2023-06-30 DIAGNOSIS — D509 Iron deficiency anemia, unspecified: Secondary | ICD-10-CM | POA: Diagnosis not present

## 2023-06-30 DIAGNOSIS — Z992 Dependence on renal dialysis: Secondary | ICD-10-CM | POA: Diagnosis not present

## 2023-06-30 DIAGNOSIS — Z79899 Other long term (current) drug therapy: Secondary | ICD-10-CM | POA: Diagnosis not present

## 2023-06-30 DIAGNOSIS — R82998 Other abnormal findings in urine: Secondary | ICD-10-CM | POA: Diagnosis not present

## 2023-06-30 DIAGNOSIS — E440 Moderate protein-calorie malnutrition: Secondary | ICD-10-CM | POA: Diagnosis not present

## 2023-06-30 DIAGNOSIS — N2581 Secondary hyperparathyroidism of renal origin: Secondary | ICD-10-CM | POA: Diagnosis not present

## 2023-06-30 DIAGNOSIS — D631 Anemia in chronic kidney disease: Secondary | ICD-10-CM | POA: Diagnosis not present

## 2023-06-30 DIAGNOSIS — R17 Unspecified jaundice: Secondary | ICD-10-CM | POA: Diagnosis not present

## 2023-06-30 DIAGNOSIS — E875 Hyperkalemia: Secondary | ICD-10-CM | POA: Diagnosis not present

## 2023-07-01 DIAGNOSIS — N186 End stage renal disease: Secondary | ICD-10-CM | POA: Diagnosis not present

## 2023-07-01 DIAGNOSIS — Z992 Dependence on renal dialysis: Secondary | ICD-10-CM | POA: Diagnosis not present

## 2023-07-01 DIAGNOSIS — N049 Nephrotic syndrome with unspecified morphologic changes: Secondary | ICD-10-CM | POA: Diagnosis not present

## 2023-07-02 DIAGNOSIS — D631 Anemia in chronic kidney disease: Secondary | ICD-10-CM | POA: Diagnosis not present

## 2023-07-02 DIAGNOSIS — R82998 Other abnormal findings in urine: Secondary | ICD-10-CM | POA: Diagnosis not present

## 2023-07-02 DIAGNOSIS — D509 Iron deficiency anemia, unspecified: Secondary | ICD-10-CM | POA: Diagnosis not present

## 2023-07-02 DIAGNOSIS — Z992 Dependence on renal dialysis: Secondary | ICD-10-CM | POA: Diagnosis not present

## 2023-07-02 DIAGNOSIS — E440 Moderate protein-calorie malnutrition: Secondary | ICD-10-CM | POA: Diagnosis not present

## 2023-07-02 DIAGNOSIS — E875 Hyperkalemia: Secondary | ICD-10-CM | POA: Diagnosis not present

## 2023-07-02 DIAGNOSIS — N186 End stage renal disease: Secondary | ICD-10-CM | POA: Diagnosis not present

## 2023-07-02 DIAGNOSIS — Z79899 Other long term (current) drug therapy: Secondary | ICD-10-CM | POA: Diagnosis not present

## 2023-07-02 DIAGNOSIS — N2581 Secondary hyperparathyroidism of renal origin: Secondary | ICD-10-CM | POA: Diagnosis not present

## 2023-07-02 DIAGNOSIS — R17 Unspecified jaundice: Secondary | ICD-10-CM | POA: Diagnosis not present

## 2023-07-03 DIAGNOSIS — E875 Hyperkalemia: Secondary | ICD-10-CM | POA: Diagnosis not present

## 2023-07-03 DIAGNOSIS — R17 Unspecified jaundice: Secondary | ICD-10-CM | POA: Diagnosis not present

## 2023-07-03 DIAGNOSIS — N186 End stage renal disease: Secondary | ICD-10-CM | POA: Diagnosis not present

## 2023-07-03 DIAGNOSIS — Z79899 Other long term (current) drug therapy: Secondary | ICD-10-CM | POA: Diagnosis not present

## 2023-07-03 DIAGNOSIS — D631 Anemia in chronic kidney disease: Secondary | ICD-10-CM | POA: Diagnosis not present

## 2023-07-03 DIAGNOSIS — N2581 Secondary hyperparathyroidism of renal origin: Secondary | ICD-10-CM | POA: Diagnosis not present

## 2023-07-03 DIAGNOSIS — Z992 Dependence on renal dialysis: Secondary | ICD-10-CM | POA: Diagnosis not present

## 2023-07-03 DIAGNOSIS — D509 Iron deficiency anemia, unspecified: Secondary | ICD-10-CM | POA: Diagnosis not present

## 2023-07-03 DIAGNOSIS — R82998 Other abnormal findings in urine: Secondary | ICD-10-CM | POA: Diagnosis not present

## 2023-07-03 DIAGNOSIS — E440 Moderate protein-calorie malnutrition: Secondary | ICD-10-CM | POA: Diagnosis not present

## 2023-07-05 DIAGNOSIS — E440 Moderate protein-calorie malnutrition: Secondary | ICD-10-CM | POA: Diagnosis not present

## 2023-07-05 DIAGNOSIS — D631 Anemia in chronic kidney disease: Secondary | ICD-10-CM | POA: Diagnosis not present

## 2023-07-05 DIAGNOSIS — E875 Hyperkalemia: Secondary | ICD-10-CM | POA: Diagnosis not present

## 2023-07-05 DIAGNOSIS — N2581 Secondary hyperparathyroidism of renal origin: Secondary | ICD-10-CM | POA: Diagnosis not present

## 2023-07-05 DIAGNOSIS — Z79899 Other long term (current) drug therapy: Secondary | ICD-10-CM | POA: Diagnosis not present

## 2023-07-05 DIAGNOSIS — D509 Iron deficiency anemia, unspecified: Secondary | ICD-10-CM | POA: Diagnosis not present

## 2023-07-05 DIAGNOSIS — R82998 Other abnormal findings in urine: Secondary | ICD-10-CM | POA: Diagnosis not present

## 2023-07-05 DIAGNOSIS — Z992 Dependence on renal dialysis: Secondary | ICD-10-CM | POA: Diagnosis not present

## 2023-07-05 DIAGNOSIS — R17 Unspecified jaundice: Secondary | ICD-10-CM | POA: Diagnosis not present

## 2023-07-05 DIAGNOSIS — N186 End stage renal disease: Secondary | ICD-10-CM | POA: Diagnosis not present

## 2023-07-08 DIAGNOSIS — N2581 Secondary hyperparathyroidism of renal origin: Secondary | ICD-10-CM | POA: Diagnosis not present

## 2023-07-08 DIAGNOSIS — R17 Unspecified jaundice: Secondary | ICD-10-CM | POA: Diagnosis not present

## 2023-07-08 DIAGNOSIS — E440 Moderate protein-calorie malnutrition: Secondary | ICD-10-CM | POA: Diagnosis not present

## 2023-07-08 DIAGNOSIS — Z79899 Other long term (current) drug therapy: Secondary | ICD-10-CM | POA: Diagnosis not present

## 2023-07-08 DIAGNOSIS — N186 End stage renal disease: Secondary | ICD-10-CM | POA: Diagnosis not present

## 2023-07-08 DIAGNOSIS — D509 Iron deficiency anemia, unspecified: Secondary | ICD-10-CM | POA: Diagnosis not present

## 2023-07-08 DIAGNOSIS — E875 Hyperkalemia: Secondary | ICD-10-CM | POA: Diagnosis not present

## 2023-07-08 DIAGNOSIS — D631 Anemia in chronic kidney disease: Secondary | ICD-10-CM | POA: Diagnosis not present

## 2023-07-08 DIAGNOSIS — Z992 Dependence on renal dialysis: Secondary | ICD-10-CM | POA: Diagnosis not present

## 2023-07-08 DIAGNOSIS — R82998 Other abnormal findings in urine: Secondary | ICD-10-CM | POA: Diagnosis not present

## 2023-07-09 DIAGNOSIS — R17 Unspecified jaundice: Secondary | ICD-10-CM | POA: Diagnosis not present

## 2023-07-09 DIAGNOSIS — R82998 Other abnormal findings in urine: Secondary | ICD-10-CM | POA: Diagnosis not present

## 2023-07-09 DIAGNOSIS — Z79899 Other long term (current) drug therapy: Secondary | ICD-10-CM | POA: Diagnosis not present

## 2023-07-09 DIAGNOSIS — N186 End stage renal disease: Secondary | ICD-10-CM | POA: Diagnosis not present

## 2023-07-09 DIAGNOSIS — N2581 Secondary hyperparathyroidism of renal origin: Secondary | ICD-10-CM | POA: Diagnosis not present

## 2023-07-09 DIAGNOSIS — E440 Moderate protein-calorie malnutrition: Secondary | ICD-10-CM | POA: Diagnosis not present

## 2023-07-09 DIAGNOSIS — E875 Hyperkalemia: Secondary | ICD-10-CM | POA: Diagnosis not present

## 2023-07-09 DIAGNOSIS — D509 Iron deficiency anemia, unspecified: Secondary | ICD-10-CM | POA: Diagnosis not present

## 2023-07-09 DIAGNOSIS — D631 Anemia in chronic kidney disease: Secondary | ICD-10-CM | POA: Diagnosis not present

## 2023-07-09 DIAGNOSIS — Z992 Dependence on renal dialysis: Secondary | ICD-10-CM | POA: Diagnosis not present

## 2023-07-10 DIAGNOSIS — D631 Anemia in chronic kidney disease: Secondary | ICD-10-CM | POA: Diagnosis not present

## 2023-07-10 DIAGNOSIS — E440 Moderate protein-calorie malnutrition: Secondary | ICD-10-CM | POA: Diagnosis not present

## 2023-07-10 DIAGNOSIS — Z992 Dependence on renal dialysis: Secondary | ICD-10-CM | POA: Diagnosis not present

## 2023-07-10 DIAGNOSIS — R82998 Other abnormal findings in urine: Secondary | ICD-10-CM | POA: Diagnosis not present

## 2023-07-10 DIAGNOSIS — Z79899 Other long term (current) drug therapy: Secondary | ICD-10-CM | POA: Diagnosis not present

## 2023-07-10 DIAGNOSIS — D509 Iron deficiency anemia, unspecified: Secondary | ICD-10-CM | POA: Diagnosis not present

## 2023-07-10 DIAGNOSIS — N186 End stage renal disease: Secondary | ICD-10-CM | POA: Diagnosis not present

## 2023-07-10 DIAGNOSIS — N2581 Secondary hyperparathyroidism of renal origin: Secondary | ICD-10-CM | POA: Diagnosis not present

## 2023-07-10 DIAGNOSIS — E875 Hyperkalemia: Secondary | ICD-10-CM | POA: Diagnosis not present

## 2023-07-10 DIAGNOSIS — R17 Unspecified jaundice: Secondary | ICD-10-CM | POA: Diagnosis not present

## 2023-07-12 DIAGNOSIS — D509 Iron deficiency anemia, unspecified: Secondary | ICD-10-CM | POA: Diagnosis not present

## 2023-07-12 DIAGNOSIS — R82998 Other abnormal findings in urine: Secondary | ICD-10-CM | POA: Diagnosis not present

## 2023-07-12 DIAGNOSIS — N2581 Secondary hyperparathyroidism of renal origin: Secondary | ICD-10-CM | POA: Diagnosis not present

## 2023-07-12 DIAGNOSIS — E440 Moderate protein-calorie malnutrition: Secondary | ICD-10-CM | POA: Diagnosis not present

## 2023-07-12 DIAGNOSIS — Z992 Dependence on renal dialysis: Secondary | ICD-10-CM | POA: Diagnosis not present

## 2023-07-12 DIAGNOSIS — R17 Unspecified jaundice: Secondary | ICD-10-CM | POA: Diagnosis not present

## 2023-07-12 DIAGNOSIS — E875 Hyperkalemia: Secondary | ICD-10-CM | POA: Diagnosis not present

## 2023-07-12 DIAGNOSIS — D631 Anemia in chronic kidney disease: Secondary | ICD-10-CM | POA: Diagnosis not present

## 2023-07-12 DIAGNOSIS — N186 End stage renal disease: Secondary | ICD-10-CM | POA: Diagnosis not present

## 2023-07-12 DIAGNOSIS — Z79899 Other long term (current) drug therapy: Secondary | ICD-10-CM | POA: Diagnosis not present

## 2023-07-15 DIAGNOSIS — Z79899 Other long term (current) drug therapy: Secondary | ICD-10-CM | POA: Diagnosis not present

## 2023-07-15 DIAGNOSIS — N2581 Secondary hyperparathyroidism of renal origin: Secondary | ICD-10-CM | POA: Diagnosis not present

## 2023-07-15 DIAGNOSIS — N186 End stage renal disease: Secondary | ICD-10-CM | POA: Diagnosis not present

## 2023-07-15 DIAGNOSIS — D509 Iron deficiency anemia, unspecified: Secondary | ICD-10-CM | POA: Diagnosis not present

## 2023-07-15 DIAGNOSIS — E440 Moderate protein-calorie malnutrition: Secondary | ICD-10-CM | POA: Diagnosis not present

## 2023-07-15 DIAGNOSIS — E875 Hyperkalemia: Secondary | ICD-10-CM | POA: Diagnosis not present

## 2023-07-15 DIAGNOSIS — R17 Unspecified jaundice: Secondary | ICD-10-CM | POA: Diagnosis not present

## 2023-07-15 DIAGNOSIS — R82998 Other abnormal findings in urine: Secondary | ICD-10-CM | POA: Diagnosis not present

## 2023-07-15 DIAGNOSIS — Z992 Dependence on renal dialysis: Secondary | ICD-10-CM | POA: Diagnosis not present

## 2023-07-15 DIAGNOSIS — D631 Anemia in chronic kidney disease: Secondary | ICD-10-CM | POA: Diagnosis not present

## 2023-07-17 DIAGNOSIS — N186 End stage renal disease: Secondary | ICD-10-CM | POA: Diagnosis not present

## 2023-07-17 DIAGNOSIS — Z79899 Other long term (current) drug therapy: Secondary | ICD-10-CM | POA: Diagnosis not present

## 2023-07-17 DIAGNOSIS — N2581 Secondary hyperparathyroidism of renal origin: Secondary | ICD-10-CM | POA: Diagnosis not present

## 2023-07-17 DIAGNOSIS — Z992 Dependence on renal dialysis: Secondary | ICD-10-CM | POA: Diagnosis not present

## 2023-07-17 DIAGNOSIS — E440 Moderate protein-calorie malnutrition: Secondary | ICD-10-CM | POA: Diagnosis not present

## 2023-07-17 DIAGNOSIS — D509 Iron deficiency anemia, unspecified: Secondary | ICD-10-CM | POA: Diagnosis not present

## 2023-07-17 DIAGNOSIS — D631 Anemia in chronic kidney disease: Secondary | ICD-10-CM | POA: Diagnosis not present

## 2023-07-17 DIAGNOSIS — R82998 Other abnormal findings in urine: Secondary | ICD-10-CM | POA: Diagnosis not present

## 2023-07-17 DIAGNOSIS — E875 Hyperkalemia: Secondary | ICD-10-CM | POA: Diagnosis not present

## 2023-07-17 DIAGNOSIS — R17 Unspecified jaundice: Secondary | ICD-10-CM | POA: Diagnosis not present

## 2023-07-18 DIAGNOSIS — E440 Moderate protein-calorie malnutrition: Secondary | ICD-10-CM | POA: Diagnosis not present

## 2023-07-18 DIAGNOSIS — E875 Hyperkalemia: Secondary | ICD-10-CM | POA: Diagnosis not present

## 2023-07-18 DIAGNOSIS — Z992 Dependence on renal dialysis: Secondary | ICD-10-CM | POA: Diagnosis not present

## 2023-07-18 DIAGNOSIS — D631 Anemia in chronic kidney disease: Secondary | ICD-10-CM | POA: Diagnosis not present

## 2023-07-18 DIAGNOSIS — Z79899 Other long term (current) drug therapy: Secondary | ICD-10-CM | POA: Diagnosis not present

## 2023-07-18 DIAGNOSIS — N186 End stage renal disease: Secondary | ICD-10-CM | POA: Diagnosis not present

## 2023-07-18 DIAGNOSIS — D509 Iron deficiency anemia, unspecified: Secondary | ICD-10-CM | POA: Diagnosis not present

## 2023-07-18 DIAGNOSIS — R82998 Other abnormal findings in urine: Secondary | ICD-10-CM | POA: Diagnosis not present

## 2023-07-18 DIAGNOSIS — R17 Unspecified jaundice: Secondary | ICD-10-CM | POA: Diagnosis not present

## 2023-07-18 DIAGNOSIS — N2581 Secondary hyperparathyroidism of renal origin: Secondary | ICD-10-CM | POA: Diagnosis not present

## 2023-07-19 DIAGNOSIS — Z79899 Other long term (current) drug therapy: Secondary | ICD-10-CM | POA: Diagnosis not present

## 2023-07-19 DIAGNOSIS — N2581 Secondary hyperparathyroidism of renal origin: Secondary | ICD-10-CM | POA: Diagnosis not present

## 2023-07-19 DIAGNOSIS — Z992 Dependence on renal dialysis: Secondary | ICD-10-CM | POA: Diagnosis not present

## 2023-07-19 DIAGNOSIS — R82998 Other abnormal findings in urine: Secondary | ICD-10-CM | POA: Diagnosis not present

## 2023-07-19 DIAGNOSIS — E440 Moderate protein-calorie malnutrition: Secondary | ICD-10-CM | POA: Diagnosis not present

## 2023-07-19 DIAGNOSIS — R17 Unspecified jaundice: Secondary | ICD-10-CM | POA: Diagnosis not present

## 2023-07-19 DIAGNOSIS — E875 Hyperkalemia: Secondary | ICD-10-CM | POA: Diagnosis not present

## 2023-07-19 DIAGNOSIS — D631 Anemia in chronic kidney disease: Secondary | ICD-10-CM | POA: Diagnosis not present

## 2023-07-19 DIAGNOSIS — D509 Iron deficiency anemia, unspecified: Secondary | ICD-10-CM | POA: Diagnosis not present

## 2023-07-19 DIAGNOSIS — N186 End stage renal disease: Secondary | ICD-10-CM | POA: Diagnosis not present

## 2023-07-20 DIAGNOSIS — R82998 Other abnormal findings in urine: Secondary | ICD-10-CM | POA: Diagnosis not present

## 2023-07-20 DIAGNOSIS — N2581 Secondary hyperparathyroidism of renal origin: Secondary | ICD-10-CM | POA: Diagnosis not present

## 2023-07-20 DIAGNOSIS — Z79899 Other long term (current) drug therapy: Secondary | ICD-10-CM | POA: Diagnosis not present

## 2023-07-20 DIAGNOSIS — E440 Moderate protein-calorie malnutrition: Secondary | ICD-10-CM | POA: Diagnosis not present

## 2023-07-20 DIAGNOSIS — Z992 Dependence on renal dialysis: Secondary | ICD-10-CM | POA: Diagnosis not present

## 2023-07-20 DIAGNOSIS — E875 Hyperkalemia: Secondary | ICD-10-CM | POA: Diagnosis not present

## 2023-07-20 DIAGNOSIS — R17 Unspecified jaundice: Secondary | ICD-10-CM | POA: Diagnosis not present

## 2023-07-20 DIAGNOSIS — D509 Iron deficiency anemia, unspecified: Secondary | ICD-10-CM | POA: Diagnosis not present

## 2023-07-20 DIAGNOSIS — N186 End stage renal disease: Secondary | ICD-10-CM | POA: Diagnosis not present

## 2023-07-20 DIAGNOSIS — D631 Anemia in chronic kidney disease: Secondary | ICD-10-CM | POA: Diagnosis not present

## 2023-07-21 DIAGNOSIS — E875 Hyperkalemia: Secondary | ICD-10-CM | POA: Diagnosis not present

## 2023-07-21 DIAGNOSIS — R82998 Other abnormal findings in urine: Secondary | ICD-10-CM | POA: Diagnosis not present

## 2023-07-21 DIAGNOSIS — N2581 Secondary hyperparathyroidism of renal origin: Secondary | ICD-10-CM | POA: Diagnosis not present

## 2023-07-21 DIAGNOSIS — D509 Iron deficiency anemia, unspecified: Secondary | ICD-10-CM | POA: Diagnosis not present

## 2023-07-21 DIAGNOSIS — N186 End stage renal disease: Secondary | ICD-10-CM | POA: Diagnosis not present

## 2023-07-21 DIAGNOSIS — Z79899 Other long term (current) drug therapy: Secondary | ICD-10-CM | POA: Diagnosis not present

## 2023-07-21 DIAGNOSIS — Z992 Dependence on renal dialysis: Secondary | ICD-10-CM | POA: Diagnosis not present

## 2023-07-21 DIAGNOSIS — D631 Anemia in chronic kidney disease: Secondary | ICD-10-CM | POA: Diagnosis not present

## 2023-07-21 DIAGNOSIS — R17 Unspecified jaundice: Secondary | ICD-10-CM | POA: Diagnosis not present

## 2023-07-21 DIAGNOSIS — E440 Moderate protein-calorie malnutrition: Secondary | ICD-10-CM | POA: Diagnosis not present

## 2023-07-23 DIAGNOSIS — N186 End stage renal disease: Secondary | ICD-10-CM | POA: Diagnosis not present

## 2023-07-23 DIAGNOSIS — E440 Moderate protein-calorie malnutrition: Secondary | ICD-10-CM | POA: Diagnosis not present

## 2023-07-23 DIAGNOSIS — Z79899 Other long term (current) drug therapy: Secondary | ICD-10-CM | POA: Diagnosis not present

## 2023-07-23 DIAGNOSIS — D509 Iron deficiency anemia, unspecified: Secondary | ICD-10-CM | POA: Diagnosis not present

## 2023-07-23 DIAGNOSIS — D631 Anemia in chronic kidney disease: Secondary | ICD-10-CM | POA: Diagnosis not present

## 2023-07-23 DIAGNOSIS — E875 Hyperkalemia: Secondary | ICD-10-CM | POA: Diagnosis not present

## 2023-07-23 DIAGNOSIS — R17 Unspecified jaundice: Secondary | ICD-10-CM | POA: Diagnosis not present

## 2023-07-23 DIAGNOSIS — R82998 Other abnormal findings in urine: Secondary | ICD-10-CM | POA: Diagnosis not present

## 2023-07-23 DIAGNOSIS — Z992 Dependence on renal dialysis: Secondary | ICD-10-CM | POA: Diagnosis not present

## 2023-07-23 DIAGNOSIS — N2581 Secondary hyperparathyroidism of renal origin: Secondary | ICD-10-CM | POA: Diagnosis not present

## 2023-07-24 DIAGNOSIS — E440 Moderate protein-calorie malnutrition: Secondary | ICD-10-CM | POA: Diagnosis not present

## 2023-07-24 DIAGNOSIS — N186 End stage renal disease: Secondary | ICD-10-CM | POA: Diagnosis not present

## 2023-07-24 DIAGNOSIS — Z992 Dependence on renal dialysis: Secondary | ICD-10-CM | POA: Diagnosis not present

## 2023-07-24 DIAGNOSIS — D509 Iron deficiency anemia, unspecified: Secondary | ICD-10-CM | POA: Diagnosis not present

## 2023-07-24 DIAGNOSIS — N2581 Secondary hyperparathyroidism of renal origin: Secondary | ICD-10-CM | POA: Diagnosis not present

## 2023-07-24 DIAGNOSIS — Z79899 Other long term (current) drug therapy: Secondary | ICD-10-CM | POA: Diagnosis not present

## 2023-07-24 DIAGNOSIS — R82998 Other abnormal findings in urine: Secondary | ICD-10-CM | POA: Diagnosis not present

## 2023-07-24 DIAGNOSIS — D631 Anemia in chronic kidney disease: Secondary | ICD-10-CM | POA: Diagnosis not present

## 2023-07-24 DIAGNOSIS — R17 Unspecified jaundice: Secondary | ICD-10-CM | POA: Diagnosis not present

## 2023-07-24 DIAGNOSIS — E875 Hyperkalemia: Secondary | ICD-10-CM | POA: Diagnosis not present

## 2023-07-27 DIAGNOSIS — N2581 Secondary hyperparathyroidism of renal origin: Secondary | ICD-10-CM | POA: Diagnosis not present

## 2023-07-27 DIAGNOSIS — R17 Unspecified jaundice: Secondary | ICD-10-CM | POA: Diagnosis not present

## 2023-07-27 DIAGNOSIS — D631 Anemia in chronic kidney disease: Secondary | ICD-10-CM | POA: Diagnosis not present

## 2023-07-27 DIAGNOSIS — Z79899 Other long term (current) drug therapy: Secondary | ICD-10-CM | POA: Diagnosis not present

## 2023-07-27 DIAGNOSIS — E875 Hyperkalemia: Secondary | ICD-10-CM | POA: Diagnosis not present

## 2023-07-27 DIAGNOSIS — R82998 Other abnormal findings in urine: Secondary | ICD-10-CM | POA: Diagnosis not present

## 2023-07-27 DIAGNOSIS — D509 Iron deficiency anemia, unspecified: Secondary | ICD-10-CM | POA: Diagnosis not present

## 2023-07-27 DIAGNOSIS — E440 Moderate protein-calorie malnutrition: Secondary | ICD-10-CM | POA: Diagnosis not present

## 2023-07-27 DIAGNOSIS — Z992 Dependence on renal dialysis: Secondary | ICD-10-CM | POA: Diagnosis not present

## 2023-07-27 DIAGNOSIS — N186 End stage renal disease: Secondary | ICD-10-CM | POA: Diagnosis not present

## 2023-07-28 DIAGNOSIS — E440 Moderate protein-calorie malnutrition: Secondary | ICD-10-CM | POA: Diagnosis not present

## 2023-07-28 DIAGNOSIS — Z992 Dependence on renal dialysis: Secondary | ICD-10-CM | POA: Diagnosis not present

## 2023-07-28 DIAGNOSIS — D509 Iron deficiency anemia, unspecified: Secondary | ICD-10-CM | POA: Diagnosis not present

## 2023-07-28 DIAGNOSIS — D631 Anemia in chronic kidney disease: Secondary | ICD-10-CM | POA: Diagnosis not present

## 2023-07-28 DIAGNOSIS — N2581 Secondary hyperparathyroidism of renal origin: Secondary | ICD-10-CM | POA: Diagnosis not present

## 2023-07-28 DIAGNOSIS — R17 Unspecified jaundice: Secondary | ICD-10-CM | POA: Diagnosis not present

## 2023-07-28 DIAGNOSIS — N186 End stage renal disease: Secondary | ICD-10-CM | POA: Diagnosis not present

## 2023-07-28 DIAGNOSIS — R82998 Other abnormal findings in urine: Secondary | ICD-10-CM | POA: Diagnosis not present

## 2023-07-28 DIAGNOSIS — Z79899 Other long term (current) drug therapy: Secondary | ICD-10-CM | POA: Diagnosis not present

## 2023-07-28 DIAGNOSIS — E875 Hyperkalemia: Secondary | ICD-10-CM | POA: Diagnosis not present

## 2023-07-30 DIAGNOSIS — N2581 Secondary hyperparathyroidism of renal origin: Secondary | ICD-10-CM | POA: Diagnosis not present

## 2023-07-30 DIAGNOSIS — N186 End stage renal disease: Secondary | ICD-10-CM | POA: Diagnosis not present

## 2023-07-30 DIAGNOSIS — D509 Iron deficiency anemia, unspecified: Secondary | ICD-10-CM | POA: Diagnosis not present

## 2023-07-30 DIAGNOSIS — Z992 Dependence on renal dialysis: Secondary | ICD-10-CM | POA: Diagnosis not present

## 2023-07-30 DIAGNOSIS — R17 Unspecified jaundice: Secondary | ICD-10-CM | POA: Diagnosis not present

## 2023-07-30 DIAGNOSIS — Z79899 Other long term (current) drug therapy: Secondary | ICD-10-CM | POA: Diagnosis not present

## 2023-07-30 DIAGNOSIS — R82998 Other abnormal findings in urine: Secondary | ICD-10-CM | POA: Diagnosis not present

## 2023-07-30 DIAGNOSIS — E440 Moderate protein-calorie malnutrition: Secondary | ICD-10-CM | POA: Diagnosis not present

## 2023-07-30 DIAGNOSIS — E875 Hyperkalemia: Secondary | ICD-10-CM | POA: Diagnosis not present

## 2023-07-30 DIAGNOSIS — D631 Anemia in chronic kidney disease: Secondary | ICD-10-CM | POA: Diagnosis not present

## 2023-07-31 DIAGNOSIS — D631 Anemia in chronic kidney disease: Secondary | ICD-10-CM | POA: Diagnosis not present

## 2023-07-31 DIAGNOSIS — R82998 Other abnormal findings in urine: Secondary | ICD-10-CM | POA: Diagnosis not present

## 2023-07-31 DIAGNOSIS — D509 Iron deficiency anemia, unspecified: Secondary | ICD-10-CM | POA: Diagnosis not present

## 2023-07-31 DIAGNOSIS — Z992 Dependence on renal dialysis: Secondary | ICD-10-CM | POA: Diagnosis not present

## 2023-07-31 DIAGNOSIS — E440 Moderate protein-calorie malnutrition: Secondary | ICD-10-CM | POA: Diagnosis not present

## 2023-07-31 DIAGNOSIS — N186 End stage renal disease: Secondary | ICD-10-CM | POA: Diagnosis not present

## 2023-07-31 DIAGNOSIS — Z79899 Other long term (current) drug therapy: Secondary | ICD-10-CM | POA: Diagnosis not present

## 2023-07-31 DIAGNOSIS — N2581 Secondary hyperparathyroidism of renal origin: Secondary | ICD-10-CM | POA: Diagnosis not present

## 2023-07-31 DIAGNOSIS — R17 Unspecified jaundice: Secondary | ICD-10-CM | POA: Diagnosis not present

## 2023-07-31 DIAGNOSIS — E875 Hyperkalemia: Secondary | ICD-10-CM | POA: Diagnosis not present

## 2023-08-01 DIAGNOSIS — N186 End stage renal disease: Secondary | ICD-10-CM | POA: Diagnosis not present

## 2023-08-01 DIAGNOSIS — N049 Nephrotic syndrome with unspecified morphologic changes: Secondary | ICD-10-CM | POA: Diagnosis not present

## 2023-08-01 DIAGNOSIS — Z992 Dependence on renal dialysis: Secondary | ICD-10-CM | POA: Diagnosis not present

## 2023-08-02 DIAGNOSIS — E440 Moderate protein-calorie malnutrition: Secondary | ICD-10-CM | POA: Diagnosis not present

## 2023-08-02 DIAGNOSIS — N2581 Secondary hyperparathyroidism of renal origin: Secondary | ICD-10-CM | POA: Diagnosis not present

## 2023-08-02 DIAGNOSIS — Z992 Dependence on renal dialysis: Secondary | ICD-10-CM | POA: Diagnosis not present

## 2023-08-02 DIAGNOSIS — N186 End stage renal disease: Secondary | ICD-10-CM | POA: Diagnosis not present

## 2023-08-02 DIAGNOSIS — N2589 Other disorders resulting from impaired renal tubular function: Secondary | ICD-10-CM | POA: Diagnosis not present

## 2023-08-02 DIAGNOSIS — E875 Hyperkalemia: Secondary | ICD-10-CM | POA: Diagnosis not present

## 2023-08-02 DIAGNOSIS — D509 Iron deficiency anemia, unspecified: Secondary | ICD-10-CM | POA: Diagnosis not present

## 2023-08-02 DIAGNOSIS — R82998 Other abnormal findings in urine: Secondary | ICD-10-CM | POA: Diagnosis not present

## 2023-08-02 DIAGNOSIS — K769 Liver disease, unspecified: Secondary | ICD-10-CM | POA: Diagnosis not present

## 2023-08-02 DIAGNOSIS — D631 Anemia in chronic kidney disease: Secondary | ICD-10-CM | POA: Diagnosis not present

## 2023-08-04 DIAGNOSIS — E875 Hyperkalemia: Secondary | ICD-10-CM | POA: Diagnosis not present

## 2023-08-04 DIAGNOSIS — E440 Moderate protein-calorie malnutrition: Secondary | ICD-10-CM | POA: Diagnosis not present

## 2023-08-04 DIAGNOSIS — D509 Iron deficiency anemia, unspecified: Secondary | ICD-10-CM | POA: Diagnosis not present

## 2023-08-04 DIAGNOSIS — D631 Anemia in chronic kidney disease: Secondary | ICD-10-CM | POA: Diagnosis not present

## 2023-08-04 DIAGNOSIS — N186 End stage renal disease: Secondary | ICD-10-CM | POA: Diagnosis not present

## 2023-08-04 DIAGNOSIS — N2589 Other disorders resulting from impaired renal tubular function: Secondary | ICD-10-CM | POA: Diagnosis not present

## 2023-08-04 DIAGNOSIS — Z992 Dependence on renal dialysis: Secondary | ICD-10-CM | POA: Diagnosis not present

## 2023-08-04 DIAGNOSIS — R82998 Other abnormal findings in urine: Secondary | ICD-10-CM | POA: Diagnosis not present

## 2023-08-04 DIAGNOSIS — K769 Liver disease, unspecified: Secondary | ICD-10-CM | POA: Diagnosis not present

## 2023-08-04 DIAGNOSIS — N2581 Secondary hyperparathyroidism of renal origin: Secondary | ICD-10-CM | POA: Diagnosis not present

## 2023-08-06 DIAGNOSIS — D509 Iron deficiency anemia, unspecified: Secondary | ICD-10-CM | POA: Diagnosis not present

## 2023-08-06 DIAGNOSIS — D631 Anemia in chronic kidney disease: Secondary | ICD-10-CM | POA: Diagnosis not present

## 2023-08-06 DIAGNOSIS — N186 End stage renal disease: Secondary | ICD-10-CM | POA: Diagnosis not present

## 2023-08-06 DIAGNOSIS — R82998 Other abnormal findings in urine: Secondary | ICD-10-CM | POA: Diagnosis not present

## 2023-08-06 DIAGNOSIS — N2589 Other disorders resulting from impaired renal tubular function: Secondary | ICD-10-CM | POA: Diagnosis not present

## 2023-08-06 DIAGNOSIS — K769 Liver disease, unspecified: Secondary | ICD-10-CM | POA: Diagnosis not present

## 2023-08-06 DIAGNOSIS — N2581 Secondary hyperparathyroidism of renal origin: Secondary | ICD-10-CM | POA: Diagnosis not present

## 2023-08-06 DIAGNOSIS — E875 Hyperkalemia: Secondary | ICD-10-CM | POA: Diagnosis not present

## 2023-08-06 DIAGNOSIS — Z992 Dependence on renal dialysis: Secondary | ICD-10-CM | POA: Diagnosis not present

## 2023-08-06 DIAGNOSIS — E440 Moderate protein-calorie malnutrition: Secondary | ICD-10-CM | POA: Diagnosis not present

## 2023-08-07 DIAGNOSIS — N2581 Secondary hyperparathyroidism of renal origin: Secondary | ICD-10-CM | POA: Diagnosis not present

## 2023-08-07 DIAGNOSIS — D509 Iron deficiency anemia, unspecified: Secondary | ICD-10-CM | POA: Diagnosis not present

## 2023-08-07 DIAGNOSIS — E440 Moderate protein-calorie malnutrition: Secondary | ICD-10-CM | POA: Diagnosis not present

## 2023-08-07 DIAGNOSIS — E875 Hyperkalemia: Secondary | ICD-10-CM | POA: Diagnosis not present

## 2023-08-07 DIAGNOSIS — D631 Anemia in chronic kidney disease: Secondary | ICD-10-CM | POA: Diagnosis not present

## 2023-08-07 DIAGNOSIS — R82998 Other abnormal findings in urine: Secondary | ICD-10-CM | POA: Diagnosis not present

## 2023-08-07 DIAGNOSIS — N2589 Other disorders resulting from impaired renal tubular function: Secondary | ICD-10-CM | POA: Diagnosis not present

## 2023-08-07 DIAGNOSIS — Z992 Dependence on renal dialysis: Secondary | ICD-10-CM | POA: Diagnosis not present

## 2023-08-07 DIAGNOSIS — K769 Liver disease, unspecified: Secondary | ICD-10-CM | POA: Diagnosis not present

## 2023-08-07 DIAGNOSIS — N186 End stage renal disease: Secondary | ICD-10-CM | POA: Diagnosis not present

## 2023-08-11 DIAGNOSIS — D631 Anemia in chronic kidney disease: Secondary | ICD-10-CM | POA: Diagnosis not present

## 2023-08-11 DIAGNOSIS — N186 End stage renal disease: Secondary | ICD-10-CM | POA: Diagnosis not present

## 2023-08-11 DIAGNOSIS — E440 Moderate protein-calorie malnutrition: Secondary | ICD-10-CM | POA: Diagnosis not present

## 2023-08-11 DIAGNOSIS — D509 Iron deficiency anemia, unspecified: Secondary | ICD-10-CM | POA: Diagnosis not present

## 2023-08-11 DIAGNOSIS — R82998 Other abnormal findings in urine: Secondary | ICD-10-CM | POA: Diagnosis not present

## 2023-08-11 DIAGNOSIS — Z992 Dependence on renal dialysis: Secondary | ICD-10-CM | POA: Diagnosis not present

## 2023-08-11 DIAGNOSIS — N2589 Other disorders resulting from impaired renal tubular function: Secondary | ICD-10-CM | POA: Diagnosis not present

## 2023-08-11 DIAGNOSIS — N2581 Secondary hyperparathyroidism of renal origin: Secondary | ICD-10-CM | POA: Diagnosis not present

## 2023-08-11 DIAGNOSIS — E875 Hyperkalemia: Secondary | ICD-10-CM | POA: Diagnosis not present

## 2023-08-11 DIAGNOSIS — K769 Liver disease, unspecified: Secondary | ICD-10-CM | POA: Diagnosis not present

## 2023-08-13 DIAGNOSIS — N2589 Other disorders resulting from impaired renal tubular function: Secondary | ICD-10-CM | POA: Diagnosis not present

## 2023-08-13 DIAGNOSIS — D631 Anemia in chronic kidney disease: Secondary | ICD-10-CM | POA: Diagnosis not present

## 2023-08-13 DIAGNOSIS — D509 Iron deficiency anemia, unspecified: Secondary | ICD-10-CM | POA: Diagnosis not present

## 2023-08-13 DIAGNOSIS — R82998 Other abnormal findings in urine: Secondary | ICD-10-CM | POA: Diagnosis not present

## 2023-08-13 DIAGNOSIS — K769 Liver disease, unspecified: Secondary | ICD-10-CM | POA: Diagnosis not present

## 2023-08-13 DIAGNOSIS — N186 End stage renal disease: Secondary | ICD-10-CM | POA: Diagnosis not present

## 2023-08-13 DIAGNOSIS — Z992 Dependence on renal dialysis: Secondary | ICD-10-CM | POA: Diagnosis not present

## 2023-08-13 DIAGNOSIS — N2581 Secondary hyperparathyroidism of renal origin: Secondary | ICD-10-CM | POA: Diagnosis not present

## 2023-08-13 DIAGNOSIS — E875 Hyperkalemia: Secondary | ICD-10-CM | POA: Diagnosis not present

## 2023-08-13 DIAGNOSIS — E440 Moderate protein-calorie malnutrition: Secondary | ICD-10-CM | POA: Diagnosis not present

## 2023-08-14 DIAGNOSIS — H524 Presbyopia: Secondary | ICD-10-CM | POA: Diagnosis not present

## 2023-08-14 DIAGNOSIS — I10 Essential (primary) hypertension: Secondary | ICD-10-CM | POA: Diagnosis not present

## 2023-08-14 DIAGNOSIS — H40023 Open angle with borderline findings, high risk, bilateral: Secondary | ICD-10-CM | POA: Diagnosis not present

## 2023-08-14 DIAGNOSIS — H52223 Regular astigmatism, bilateral: Secondary | ICD-10-CM | POA: Diagnosis not present

## 2023-08-14 DIAGNOSIS — H5213 Myopia, bilateral: Secondary | ICD-10-CM | POA: Diagnosis not present

## 2023-08-15 DIAGNOSIS — N2589 Other disorders resulting from impaired renal tubular function: Secondary | ICD-10-CM | POA: Diagnosis not present

## 2023-08-15 DIAGNOSIS — R82998 Other abnormal findings in urine: Secondary | ICD-10-CM | POA: Diagnosis not present

## 2023-08-15 DIAGNOSIS — E875 Hyperkalemia: Secondary | ICD-10-CM | POA: Diagnosis not present

## 2023-08-15 DIAGNOSIS — Z992 Dependence on renal dialysis: Secondary | ICD-10-CM | POA: Diagnosis not present

## 2023-08-15 DIAGNOSIS — K769 Liver disease, unspecified: Secondary | ICD-10-CM | POA: Diagnosis not present

## 2023-08-15 DIAGNOSIS — D509 Iron deficiency anemia, unspecified: Secondary | ICD-10-CM | POA: Diagnosis not present

## 2023-08-15 DIAGNOSIS — N2581 Secondary hyperparathyroidism of renal origin: Secondary | ICD-10-CM | POA: Diagnosis not present

## 2023-08-15 DIAGNOSIS — D631 Anemia in chronic kidney disease: Secondary | ICD-10-CM | POA: Diagnosis not present

## 2023-08-15 DIAGNOSIS — N186 End stage renal disease: Secondary | ICD-10-CM | POA: Diagnosis not present

## 2023-08-15 DIAGNOSIS — E440 Moderate protein-calorie malnutrition: Secondary | ICD-10-CM | POA: Diagnosis not present

## 2023-08-16 DIAGNOSIS — N2581 Secondary hyperparathyroidism of renal origin: Secondary | ICD-10-CM | POA: Diagnosis not present

## 2023-08-16 DIAGNOSIS — E875 Hyperkalemia: Secondary | ICD-10-CM | POA: Diagnosis not present

## 2023-08-16 DIAGNOSIS — D509 Iron deficiency anemia, unspecified: Secondary | ICD-10-CM | POA: Diagnosis not present

## 2023-08-16 DIAGNOSIS — Z992 Dependence on renal dialysis: Secondary | ICD-10-CM | POA: Diagnosis not present

## 2023-08-16 DIAGNOSIS — N2589 Other disorders resulting from impaired renal tubular function: Secondary | ICD-10-CM | POA: Diagnosis not present

## 2023-08-16 DIAGNOSIS — E440 Moderate protein-calorie malnutrition: Secondary | ICD-10-CM | POA: Diagnosis not present

## 2023-08-16 DIAGNOSIS — R82998 Other abnormal findings in urine: Secondary | ICD-10-CM | POA: Diagnosis not present

## 2023-08-16 DIAGNOSIS — D631 Anemia in chronic kidney disease: Secondary | ICD-10-CM | POA: Diagnosis not present

## 2023-08-16 DIAGNOSIS — N186 End stage renal disease: Secondary | ICD-10-CM | POA: Diagnosis not present

## 2023-08-16 DIAGNOSIS — K769 Liver disease, unspecified: Secondary | ICD-10-CM | POA: Diagnosis not present

## 2023-08-19 DIAGNOSIS — N2581 Secondary hyperparathyroidism of renal origin: Secondary | ICD-10-CM | POA: Diagnosis not present

## 2023-08-19 DIAGNOSIS — E440 Moderate protein-calorie malnutrition: Secondary | ICD-10-CM | POA: Diagnosis not present

## 2023-08-19 DIAGNOSIS — D631 Anemia in chronic kidney disease: Secondary | ICD-10-CM | POA: Diagnosis not present

## 2023-08-19 DIAGNOSIS — N2589 Other disorders resulting from impaired renal tubular function: Secondary | ICD-10-CM | POA: Diagnosis not present

## 2023-08-19 DIAGNOSIS — K769 Liver disease, unspecified: Secondary | ICD-10-CM | POA: Diagnosis not present

## 2023-08-19 DIAGNOSIS — R82998 Other abnormal findings in urine: Secondary | ICD-10-CM | POA: Diagnosis not present

## 2023-08-19 DIAGNOSIS — D509 Iron deficiency anemia, unspecified: Secondary | ICD-10-CM | POA: Diagnosis not present

## 2023-08-19 DIAGNOSIS — E875 Hyperkalemia: Secondary | ICD-10-CM | POA: Diagnosis not present

## 2023-08-19 DIAGNOSIS — Z992 Dependence on renal dialysis: Secondary | ICD-10-CM | POA: Diagnosis not present

## 2023-08-19 DIAGNOSIS — N186 End stage renal disease: Secondary | ICD-10-CM | POA: Diagnosis not present

## 2023-08-21 DIAGNOSIS — Z992 Dependence on renal dialysis: Secondary | ICD-10-CM | POA: Diagnosis not present

## 2023-08-21 DIAGNOSIS — K769 Liver disease, unspecified: Secondary | ICD-10-CM | POA: Diagnosis not present

## 2023-08-21 DIAGNOSIS — E875 Hyperkalemia: Secondary | ICD-10-CM | POA: Diagnosis not present

## 2023-08-21 DIAGNOSIS — N186 End stage renal disease: Secondary | ICD-10-CM | POA: Diagnosis not present

## 2023-08-21 DIAGNOSIS — E440 Moderate protein-calorie malnutrition: Secondary | ICD-10-CM | POA: Diagnosis not present

## 2023-08-21 DIAGNOSIS — R82998 Other abnormal findings in urine: Secondary | ICD-10-CM | POA: Diagnosis not present

## 2023-08-21 DIAGNOSIS — D631 Anemia in chronic kidney disease: Secondary | ICD-10-CM | POA: Diagnosis not present

## 2023-08-21 DIAGNOSIS — N2589 Other disorders resulting from impaired renal tubular function: Secondary | ICD-10-CM | POA: Diagnosis not present

## 2023-08-21 DIAGNOSIS — N2581 Secondary hyperparathyroidism of renal origin: Secondary | ICD-10-CM | POA: Diagnosis not present

## 2023-08-21 DIAGNOSIS — D509 Iron deficiency anemia, unspecified: Secondary | ICD-10-CM | POA: Diagnosis not present

## 2023-08-22 DIAGNOSIS — D631 Anemia in chronic kidney disease: Secondary | ICD-10-CM | POA: Diagnosis not present

## 2023-08-22 DIAGNOSIS — R82998 Other abnormal findings in urine: Secondary | ICD-10-CM | POA: Diagnosis not present

## 2023-08-22 DIAGNOSIS — K769 Liver disease, unspecified: Secondary | ICD-10-CM | POA: Diagnosis not present

## 2023-08-22 DIAGNOSIS — D509 Iron deficiency anemia, unspecified: Secondary | ICD-10-CM | POA: Diagnosis not present

## 2023-08-22 DIAGNOSIS — N2589 Other disorders resulting from impaired renal tubular function: Secondary | ICD-10-CM | POA: Diagnosis not present

## 2023-08-22 DIAGNOSIS — N186 End stage renal disease: Secondary | ICD-10-CM | POA: Diagnosis not present

## 2023-08-22 DIAGNOSIS — N2581 Secondary hyperparathyroidism of renal origin: Secondary | ICD-10-CM | POA: Diagnosis not present

## 2023-08-22 DIAGNOSIS — Z992 Dependence on renal dialysis: Secondary | ICD-10-CM | POA: Diagnosis not present

## 2023-08-22 DIAGNOSIS — E875 Hyperkalemia: Secondary | ICD-10-CM | POA: Diagnosis not present

## 2023-08-22 DIAGNOSIS — E440 Moderate protein-calorie malnutrition: Secondary | ICD-10-CM | POA: Diagnosis not present

## 2023-08-23 DIAGNOSIS — N2581 Secondary hyperparathyroidism of renal origin: Secondary | ICD-10-CM | POA: Diagnosis not present

## 2023-08-23 DIAGNOSIS — N186 End stage renal disease: Secondary | ICD-10-CM | POA: Diagnosis not present

## 2023-08-23 DIAGNOSIS — E875 Hyperkalemia: Secondary | ICD-10-CM | POA: Diagnosis not present

## 2023-08-23 DIAGNOSIS — N2589 Other disorders resulting from impaired renal tubular function: Secondary | ICD-10-CM | POA: Diagnosis not present

## 2023-08-23 DIAGNOSIS — K769 Liver disease, unspecified: Secondary | ICD-10-CM | POA: Diagnosis not present

## 2023-08-23 DIAGNOSIS — Z992 Dependence on renal dialysis: Secondary | ICD-10-CM | POA: Diagnosis not present

## 2023-08-23 DIAGNOSIS — E440 Moderate protein-calorie malnutrition: Secondary | ICD-10-CM | POA: Diagnosis not present

## 2023-08-23 DIAGNOSIS — D631 Anemia in chronic kidney disease: Secondary | ICD-10-CM | POA: Diagnosis not present

## 2023-08-23 DIAGNOSIS — R82998 Other abnormal findings in urine: Secondary | ICD-10-CM | POA: Diagnosis not present

## 2023-08-23 DIAGNOSIS — D509 Iron deficiency anemia, unspecified: Secondary | ICD-10-CM | POA: Diagnosis not present

## 2023-08-26 DIAGNOSIS — N2589 Other disorders resulting from impaired renal tubular function: Secondary | ICD-10-CM | POA: Diagnosis not present

## 2023-08-26 DIAGNOSIS — E440 Moderate protein-calorie malnutrition: Secondary | ICD-10-CM | POA: Diagnosis not present

## 2023-08-26 DIAGNOSIS — N2581 Secondary hyperparathyroidism of renal origin: Secondary | ICD-10-CM | POA: Diagnosis not present

## 2023-08-26 DIAGNOSIS — D509 Iron deficiency anemia, unspecified: Secondary | ICD-10-CM | POA: Diagnosis not present

## 2023-08-26 DIAGNOSIS — K769 Liver disease, unspecified: Secondary | ICD-10-CM | POA: Diagnosis not present

## 2023-08-26 DIAGNOSIS — D631 Anemia in chronic kidney disease: Secondary | ICD-10-CM | POA: Diagnosis not present

## 2023-08-26 DIAGNOSIS — E875 Hyperkalemia: Secondary | ICD-10-CM | POA: Diagnosis not present

## 2023-08-26 DIAGNOSIS — N186 End stage renal disease: Secondary | ICD-10-CM | POA: Diagnosis not present

## 2023-08-26 DIAGNOSIS — R82998 Other abnormal findings in urine: Secondary | ICD-10-CM | POA: Diagnosis not present

## 2023-08-26 DIAGNOSIS — Z992 Dependence on renal dialysis: Secondary | ICD-10-CM | POA: Diagnosis not present

## 2023-08-27 DIAGNOSIS — D509 Iron deficiency anemia, unspecified: Secondary | ICD-10-CM | POA: Diagnosis not present

## 2023-08-27 DIAGNOSIS — N2589 Other disorders resulting from impaired renal tubular function: Secondary | ICD-10-CM | POA: Diagnosis not present

## 2023-08-27 DIAGNOSIS — D631 Anemia in chronic kidney disease: Secondary | ICD-10-CM | POA: Diagnosis not present

## 2023-08-27 DIAGNOSIS — E440 Moderate protein-calorie malnutrition: Secondary | ICD-10-CM | POA: Diagnosis not present

## 2023-08-27 DIAGNOSIS — Z992 Dependence on renal dialysis: Secondary | ICD-10-CM | POA: Diagnosis not present

## 2023-08-27 DIAGNOSIS — N2581 Secondary hyperparathyroidism of renal origin: Secondary | ICD-10-CM | POA: Diagnosis not present

## 2023-08-27 DIAGNOSIS — K769 Liver disease, unspecified: Secondary | ICD-10-CM | POA: Diagnosis not present

## 2023-08-27 DIAGNOSIS — E875 Hyperkalemia: Secondary | ICD-10-CM | POA: Diagnosis not present

## 2023-08-27 DIAGNOSIS — R82998 Other abnormal findings in urine: Secondary | ICD-10-CM | POA: Diagnosis not present

## 2023-08-27 DIAGNOSIS — N186 End stage renal disease: Secondary | ICD-10-CM | POA: Diagnosis not present

## 2023-08-28 DIAGNOSIS — N2589 Other disorders resulting from impaired renal tubular function: Secondary | ICD-10-CM | POA: Diagnosis not present

## 2023-08-28 DIAGNOSIS — D509 Iron deficiency anemia, unspecified: Secondary | ICD-10-CM | POA: Diagnosis not present

## 2023-08-28 DIAGNOSIS — K769 Liver disease, unspecified: Secondary | ICD-10-CM | POA: Diagnosis not present

## 2023-08-28 DIAGNOSIS — D631 Anemia in chronic kidney disease: Secondary | ICD-10-CM | POA: Diagnosis not present

## 2023-08-28 DIAGNOSIS — E875 Hyperkalemia: Secondary | ICD-10-CM | POA: Diagnosis not present

## 2023-08-28 DIAGNOSIS — N186 End stage renal disease: Secondary | ICD-10-CM | POA: Diagnosis not present

## 2023-08-28 DIAGNOSIS — R82998 Other abnormal findings in urine: Secondary | ICD-10-CM | POA: Diagnosis not present

## 2023-08-28 DIAGNOSIS — E440 Moderate protein-calorie malnutrition: Secondary | ICD-10-CM | POA: Diagnosis not present

## 2023-08-28 DIAGNOSIS — N2581 Secondary hyperparathyroidism of renal origin: Secondary | ICD-10-CM | POA: Diagnosis not present

## 2023-08-28 DIAGNOSIS — Z992 Dependence on renal dialysis: Secondary | ICD-10-CM | POA: Diagnosis not present

## 2023-08-31 DIAGNOSIS — N2589 Other disorders resulting from impaired renal tubular function: Secondary | ICD-10-CM | POA: Diagnosis not present

## 2023-08-31 DIAGNOSIS — E440 Moderate protein-calorie malnutrition: Secondary | ICD-10-CM | POA: Diagnosis not present

## 2023-08-31 DIAGNOSIS — Z992 Dependence on renal dialysis: Secondary | ICD-10-CM | POA: Diagnosis not present

## 2023-08-31 DIAGNOSIS — K769 Liver disease, unspecified: Secondary | ICD-10-CM | POA: Diagnosis not present

## 2023-08-31 DIAGNOSIS — N049 Nephrotic syndrome with unspecified morphologic changes: Secondary | ICD-10-CM | POA: Diagnosis not present

## 2023-08-31 DIAGNOSIS — N2581 Secondary hyperparathyroidism of renal origin: Secondary | ICD-10-CM | POA: Diagnosis not present

## 2023-08-31 DIAGNOSIS — D509 Iron deficiency anemia, unspecified: Secondary | ICD-10-CM | POA: Diagnosis not present

## 2023-08-31 DIAGNOSIS — E875 Hyperkalemia: Secondary | ICD-10-CM | POA: Diagnosis not present

## 2023-08-31 DIAGNOSIS — D631 Anemia in chronic kidney disease: Secondary | ICD-10-CM | POA: Diagnosis not present

## 2023-08-31 DIAGNOSIS — N186 End stage renal disease: Secondary | ICD-10-CM | POA: Diagnosis not present

## 2023-08-31 DIAGNOSIS — R82998 Other abnormal findings in urine: Secondary | ICD-10-CM | POA: Diagnosis not present

## 2023-09-01 DIAGNOSIS — E875 Hyperkalemia: Secondary | ICD-10-CM | POA: Diagnosis not present

## 2023-09-01 DIAGNOSIS — D509 Iron deficiency anemia, unspecified: Secondary | ICD-10-CM | POA: Diagnosis not present

## 2023-09-01 DIAGNOSIS — R82998 Other abnormal findings in urine: Secondary | ICD-10-CM | POA: Diagnosis not present

## 2023-09-01 DIAGNOSIS — Z992 Dependence on renal dialysis: Secondary | ICD-10-CM | POA: Diagnosis not present

## 2023-09-01 DIAGNOSIS — R17 Unspecified jaundice: Secondary | ICD-10-CM | POA: Diagnosis not present

## 2023-09-01 DIAGNOSIS — E440 Moderate protein-calorie malnutrition: Secondary | ICD-10-CM | POA: Diagnosis not present

## 2023-09-01 DIAGNOSIS — N2581 Secondary hyperparathyroidism of renal origin: Secondary | ICD-10-CM | POA: Diagnosis not present

## 2023-09-01 DIAGNOSIS — D631 Anemia in chronic kidney disease: Secondary | ICD-10-CM | POA: Diagnosis not present

## 2023-09-01 DIAGNOSIS — Z79899 Other long term (current) drug therapy: Secondary | ICD-10-CM | POA: Diagnosis not present

## 2023-09-01 DIAGNOSIS — N186 End stage renal disease: Secondary | ICD-10-CM | POA: Diagnosis not present

## 2023-09-03 DIAGNOSIS — E440 Moderate protein-calorie malnutrition: Secondary | ICD-10-CM | POA: Diagnosis not present

## 2023-09-03 DIAGNOSIS — N2581 Secondary hyperparathyroidism of renal origin: Secondary | ICD-10-CM | POA: Diagnosis not present

## 2023-09-03 DIAGNOSIS — R82998 Other abnormal findings in urine: Secondary | ICD-10-CM | POA: Diagnosis not present

## 2023-09-03 DIAGNOSIS — R17 Unspecified jaundice: Secondary | ICD-10-CM | POA: Diagnosis not present

## 2023-09-03 DIAGNOSIS — D509 Iron deficiency anemia, unspecified: Secondary | ICD-10-CM | POA: Diagnosis not present

## 2023-09-03 DIAGNOSIS — E875 Hyperkalemia: Secondary | ICD-10-CM | POA: Diagnosis not present

## 2023-09-03 DIAGNOSIS — Z79899 Other long term (current) drug therapy: Secondary | ICD-10-CM | POA: Diagnosis not present

## 2023-09-03 DIAGNOSIS — D631 Anemia in chronic kidney disease: Secondary | ICD-10-CM | POA: Diagnosis not present

## 2023-09-03 DIAGNOSIS — Z992 Dependence on renal dialysis: Secondary | ICD-10-CM | POA: Diagnosis not present

## 2023-09-03 DIAGNOSIS — N186 End stage renal disease: Secondary | ICD-10-CM | POA: Diagnosis not present

## 2023-09-04 DIAGNOSIS — R17 Unspecified jaundice: Secondary | ICD-10-CM | POA: Diagnosis not present

## 2023-09-04 DIAGNOSIS — E875 Hyperkalemia: Secondary | ICD-10-CM | POA: Diagnosis not present

## 2023-09-04 DIAGNOSIS — E440 Moderate protein-calorie malnutrition: Secondary | ICD-10-CM | POA: Diagnosis not present

## 2023-09-04 DIAGNOSIS — Z79899 Other long term (current) drug therapy: Secondary | ICD-10-CM | POA: Diagnosis not present

## 2023-09-04 DIAGNOSIS — Z992 Dependence on renal dialysis: Secondary | ICD-10-CM | POA: Diagnosis not present

## 2023-09-04 DIAGNOSIS — D509 Iron deficiency anemia, unspecified: Secondary | ICD-10-CM | POA: Diagnosis not present

## 2023-09-04 DIAGNOSIS — R82998 Other abnormal findings in urine: Secondary | ICD-10-CM | POA: Diagnosis not present

## 2023-09-04 DIAGNOSIS — N2581 Secondary hyperparathyroidism of renal origin: Secondary | ICD-10-CM | POA: Diagnosis not present

## 2023-09-04 DIAGNOSIS — D631 Anemia in chronic kidney disease: Secondary | ICD-10-CM | POA: Diagnosis not present

## 2023-09-04 DIAGNOSIS — N186 End stage renal disease: Secondary | ICD-10-CM | POA: Diagnosis not present

## 2023-09-06 DIAGNOSIS — R82998 Other abnormal findings in urine: Secondary | ICD-10-CM | POA: Diagnosis not present

## 2023-09-06 DIAGNOSIS — R17 Unspecified jaundice: Secondary | ICD-10-CM | POA: Diagnosis not present

## 2023-09-06 DIAGNOSIS — Z79899 Other long term (current) drug therapy: Secondary | ICD-10-CM | POA: Diagnosis not present

## 2023-09-06 DIAGNOSIS — E440 Moderate protein-calorie malnutrition: Secondary | ICD-10-CM | POA: Diagnosis not present

## 2023-09-06 DIAGNOSIS — D509 Iron deficiency anemia, unspecified: Secondary | ICD-10-CM | POA: Diagnosis not present

## 2023-09-06 DIAGNOSIS — N186 End stage renal disease: Secondary | ICD-10-CM | POA: Diagnosis not present

## 2023-09-06 DIAGNOSIS — N2581 Secondary hyperparathyroidism of renal origin: Secondary | ICD-10-CM | POA: Diagnosis not present

## 2023-09-06 DIAGNOSIS — E875 Hyperkalemia: Secondary | ICD-10-CM | POA: Diagnosis not present

## 2023-09-06 DIAGNOSIS — Z992 Dependence on renal dialysis: Secondary | ICD-10-CM | POA: Diagnosis not present

## 2023-09-06 DIAGNOSIS — D631 Anemia in chronic kidney disease: Secondary | ICD-10-CM | POA: Diagnosis not present

## 2023-09-07 DIAGNOSIS — N2581 Secondary hyperparathyroidism of renal origin: Secondary | ICD-10-CM | POA: Diagnosis not present

## 2023-09-07 DIAGNOSIS — D509 Iron deficiency anemia, unspecified: Secondary | ICD-10-CM | POA: Diagnosis not present

## 2023-09-07 DIAGNOSIS — R82998 Other abnormal findings in urine: Secondary | ICD-10-CM | POA: Diagnosis not present

## 2023-09-07 DIAGNOSIS — E440 Moderate protein-calorie malnutrition: Secondary | ICD-10-CM | POA: Diagnosis not present

## 2023-09-07 DIAGNOSIS — R17 Unspecified jaundice: Secondary | ICD-10-CM | POA: Diagnosis not present

## 2023-09-07 DIAGNOSIS — Z79899 Other long term (current) drug therapy: Secondary | ICD-10-CM | POA: Diagnosis not present

## 2023-09-07 DIAGNOSIS — N186 End stage renal disease: Secondary | ICD-10-CM | POA: Diagnosis not present

## 2023-09-07 DIAGNOSIS — Z992 Dependence on renal dialysis: Secondary | ICD-10-CM | POA: Diagnosis not present

## 2023-09-07 DIAGNOSIS — E875 Hyperkalemia: Secondary | ICD-10-CM | POA: Diagnosis not present

## 2023-09-07 DIAGNOSIS — D631 Anemia in chronic kidney disease: Secondary | ICD-10-CM | POA: Diagnosis not present

## 2023-09-09 DIAGNOSIS — N186 End stage renal disease: Secondary | ICD-10-CM | POA: Diagnosis not present

## 2023-09-09 DIAGNOSIS — D631 Anemia in chronic kidney disease: Secondary | ICD-10-CM | POA: Diagnosis not present

## 2023-09-09 DIAGNOSIS — D509 Iron deficiency anemia, unspecified: Secondary | ICD-10-CM | POA: Diagnosis not present

## 2023-09-09 DIAGNOSIS — Z79899 Other long term (current) drug therapy: Secondary | ICD-10-CM | POA: Diagnosis not present

## 2023-09-09 DIAGNOSIS — R82998 Other abnormal findings in urine: Secondary | ICD-10-CM | POA: Diagnosis not present

## 2023-09-09 DIAGNOSIS — E440 Moderate protein-calorie malnutrition: Secondary | ICD-10-CM | POA: Diagnosis not present

## 2023-09-09 DIAGNOSIS — N2581 Secondary hyperparathyroidism of renal origin: Secondary | ICD-10-CM | POA: Diagnosis not present

## 2023-09-09 DIAGNOSIS — E875 Hyperkalemia: Secondary | ICD-10-CM | POA: Diagnosis not present

## 2023-09-09 DIAGNOSIS — R17 Unspecified jaundice: Secondary | ICD-10-CM | POA: Diagnosis not present

## 2023-09-09 DIAGNOSIS — Z992 Dependence on renal dialysis: Secondary | ICD-10-CM | POA: Diagnosis not present

## 2023-09-10 DIAGNOSIS — Z992 Dependence on renal dialysis: Secondary | ICD-10-CM | POA: Diagnosis not present

## 2023-09-10 DIAGNOSIS — R17 Unspecified jaundice: Secondary | ICD-10-CM | POA: Diagnosis not present

## 2023-09-10 DIAGNOSIS — D631 Anemia in chronic kidney disease: Secondary | ICD-10-CM | POA: Diagnosis not present

## 2023-09-10 DIAGNOSIS — N186 End stage renal disease: Secondary | ICD-10-CM | POA: Diagnosis not present

## 2023-09-10 DIAGNOSIS — Z79899 Other long term (current) drug therapy: Secondary | ICD-10-CM | POA: Diagnosis not present

## 2023-09-10 DIAGNOSIS — D509 Iron deficiency anemia, unspecified: Secondary | ICD-10-CM | POA: Diagnosis not present

## 2023-09-10 DIAGNOSIS — R82998 Other abnormal findings in urine: Secondary | ICD-10-CM | POA: Diagnosis not present

## 2023-09-10 DIAGNOSIS — N2581 Secondary hyperparathyroidism of renal origin: Secondary | ICD-10-CM | POA: Diagnosis not present

## 2023-09-10 DIAGNOSIS — E440 Moderate protein-calorie malnutrition: Secondary | ICD-10-CM | POA: Diagnosis not present

## 2023-09-10 DIAGNOSIS — E875 Hyperkalemia: Secondary | ICD-10-CM | POA: Diagnosis not present

## 2023-09-12 DIAGNOSIS — N2581 Secondary hyperparathyroidism of renal origin: Secondary | ICD-10-CM | POA: Diagnosis not present

## 2023-09-12 DIAGNOSIS — D631 Anemia in chronic kidney disease: Secondary | ICD-10-CM | POA: Diagnosis not present

## 2023-09-12 DIAGNOSIS — R17 Unspecified jaundice: Secondary | ICD-10-CM | POA: Diagnosis not present

## 2023-09-12 DIAGNOSIS — Z992 Dependence on renal dialysis: Secondary | ICD-10-CM | POA: Diagnosis not present

## 2023-09-12 DIAGNOSIS — N186 End stage renal disease: Secondary | ICD-10-CM | POA: Diagnosis not present

## 2023-09-12 DIAGNOSIS — D509 Iron deficiency anemia, unspecified: Secondary | ICD-10-CM | POA: Diagnosis not present

## 2023-09-12 DIAGNOSIS — Z79899 Other long term (current) drug therapy: Secondary | ICD-10-CM | POA: Diagnosis not present

## 2023-09-12 DIAGNOSIS — E440 Moderate protein-calorie malnutrition: Secondary | ICD-10-CM | POA: Diagnosis not present

## 2023-09-12 DIAGNOSIS — E875 Hyperkalemia: Secondary | ICD-10-CM | POA: Diagnosis not present

## 2023-09-12 DIAGNOSIS — R82998 Other abnormal findings in urine: Secondary | ICD-10-CM | POA: Diagnosis not present

## 2023-09-13 DIAGNOSIS — N2581 Secondary hyperparathyroidism of renal origin: Secondary | ICD-10-CM | POA: Diagnosis not present

## 2023-09-13 DIAGNOSIS — D509 Iron deficiency anemia, unspecified: Secondary | ICD-10-CM | POA: Diagnosis not present

## 2023-09-13 DIAGNOSIS — R17 Unspecified jaundice: Secondary | ICD-10-CM | POA: Diagnosis not present

## 2023-09-13 DIAGNOSIS — D631 Anemia in chronic kidney disease: Secondary | ICD-10-CM | POA: Diagnosis not present

## 2023-09-13 DIAGNOSIS — Z992 Dependence on renal dialysis: Secondary | ICD-10-CM | POA: Diagnosis not present

## 2023-09-13 DIAGNOSIS — R82998 Other abnormal findings in urine: Secondary | ICD-10-CM | POA: Diagnosis not present

## 2023-09-13 DIAGNOSIS — N186 End stage renal disease: Secondary | ICD-10-CM | POA: Diagnosis not present

## 2023-09-13 DIAGNOSIS — E875 Hyperkalemia: Secondary | ICD-10-CM | POA: Diagnosis not present

## 2023-09-13 DIAGNOSIS — E440 Moderate protein-calorie malnutrition: Secondary | ICD-10-CM | POA: Diagnosis not present

## 2023-09-13 DIAGNOSIS — Z79899 Other long term (current) drug therapy: Secondary | ICD-10-CM | POA: Diagnosis not present

## 2023-09-16 DIAGNOSIS — R17 Unspecified jaundice: Secondary | ICD-10-CM | POA: Diagnosis not present

## 2023-09-16 DIAGNOSIS — R82998 Other abnormal findings in urine: Secondary | ICD-10-CM | POA: Diagnosis not present

## 2023-09-16 DIAGNOSIS — Z992 Dependence on renal dialysis: Secondary | ICD-10-CM | POA: Diagnosis not present

## 2023-09-16 DIAGNOSIS — D631 Anemia in chronic kidney disease: Secondary | ICD-10-CM | POA: Diagnosis not present

## 2023-09-16 DIAGNOSIS — Z79899 Other long term (current) drug therapy: Secondary | ICD-10-CM | POA: Diagnosis not present

## 2023-09-16 DIAGNOSIS — N186 End stage renal disease: Secondary | ICD-10-CM | POA: Diagnosis not present

## 2023-09-16 DIAGNOSIS — E875 Hyperkalemia: Secondary | ICD-10-CM | POA: Diagnosis not present

## 2023-09-16 DIAGNOSIS — D509 Iron deficiency anemia, unspecified: Secondary | ICD-10-CM | POA: Diagnosis not present

## 2023-09-16 DIAGNOSIS — E440 Moderate protein-calorie malnutrition: Secondary | ICD-10-CM | POA: Diagnosis not present

## 2023-09-16 DIAGNOSIS — N2581 Secondary hyperparathyroidism of renal origin: Secondary | ICD-10-CM | POA: Diagnosis not present

## 2023-09-17 DIAGNOSIS — Z79899 Other long term (current) drug therapy: Secondary | ICD-10-CM | POA: Diagnosis not present

## 2023-09-17 DIAGNOSIS — R82998 Other abnormal findings in urine: Secondary | ICD-10-CM | POA: Diagnosis not present

## 2023-09-17 DIAGNOSIS — N186 End stage renal disease: Secondary | ICD-10-CM | POA: Diagnosis not present

## 2023-09-17 DIAGNOSIS — R17 Unspecified jaundice: Secondary | ICD-10-CM | POA: Diagnosis not present

## 2023-09-17 DIAGNOSIS — E875 Hyperkalemia: Secondary | ICD-10-CM | POA: Diagnosis not present

## 2023-09-17 DIAGNOSIS — N2581 Secondary hyperparathyroidism of renal origin: Secondary | ICD-10-CM | POA: Diagnosis not present

## 2023-09-17 DIAGNOSIS — D509 Iron deficiency anemia, unspecified: Secondary | ICD-10-CM | POA: Diagnosis not present

## 2023-09-17 DIAGNOSIS — E440 Moderate protein-calorie malnutrition: Secondary | ICD-10-CM | POA: Diagnosis not present

## 2023-09-17 DIAGNOSIS — Z992 Dependence on renal dialysis: Secondary | ICD-10-CM | POA: Diagnosis not present

## 2023-09-17 DIAGNOSIS — D631 Anemia in chronic kidney disease: Secondary | ICD-10-CM | POA: Diagnosis not present

## 2023-09-19 DIAGNOSIS — R82998 Other abnormal findings in urine: Secondary | ICD-10-CM | POA: Diagnosis not present

## 2023-09-19 DIAGNOSIS — Z992 Dependence on renal dialysis: Secondary | ICD-10-CM | POA: Diagnosis not present

## 2023-09-19 DIAGNOSIS — E440 Moderate protein-calorie malnutrition: Secondary | ICD-10-CM | POA: Diagnosis not present

## 2023-09-19 DIAGNOSIS — N2581 Secondary hyperparathyroidism of renal origin: Secondary | ICD-10-CM | POA: Diagnosis not present

## 2023-09-19 DIAGNOSIS — Z79899 Other long term (current) drug therapy: Secondary | ICD-10-CM | POA: Diagnosis not present

## 2023-09-19 DIAGNOSIS — R17 Unspecified jaundice: Secondary | ICD-10-CM | POA: Diagnosis not present

## 2023-09-19 DIAGNOSIS — N186 End stage renal disease: Secondary | ICD-10-CM | POA: Diagnosis not present

## 2023-09-19 DIAGNOSIS — E875 Hyperkalemia: Secondary | ICD-10-CM | POA: Diagnosis not present

## 2023-09-19 DIAGNOSIS — D509 Iron deficiency anemia, unspecified: Secondary | ICD-10-CM | POA: Diagnosis not present

## 2023-09-19 DIAGNOSIS — D631 Anemia in chronic kidney disease: Secondary | ICD-10-CM | POA: Diagnosis not present

## 2023-09-19 NOTE — Progress Notes (Unsigned)
 09/22/23- 53 yoM never smoker for sleep evaluation with concern of OSA, complicated by ESRD/ Hemodialysis, Chronic DVT R leg,  NPSG 07/04/19- AHI 116/hr, desat to 81%, body weight 340 lbs Epworth score-12 Body weight today-325 lbs Discussed the use of AI scribe software for clinical note transcription with the patient, who gave verbal consent to proceed.  History of Present Illness   Hector Brooks is a 53 year old male who presents with difficulty sleeping and daytime fatigue.  He experiences significant difficulty sleeping, waking up two to three times per night with poor sleep quality. He feels tired throughout the day and often dozes off during activities. His wife reports loud snoring and episodes of apnea during sleep. He attempted a sleep study in 2021 but did not complete it due to discomfort. There is no history of nasal or throat surgeries and no known lung issues. He is not taking any medications to aid sleep. He undergoes hemodialysis four times a week and has minimal caffeine intake. He is morbidly obese and this was discussed as contributing to the apnea problem.     Prior to Admission medications   Medication Sig Start Date End Date Taking? Authorizing Provider  acetaminophen  (TYLENOL ) 500 MG tablet Take 1,000 mg by mouth every 6 (six) hours as needed for headache.   Yes [provider]  calcitRIOL  (ROCALTROL ) 0.25 MCG capsule Take 1 capsule (0.25 mcg total) by mouth in the morning. 06/12/22  Yes Theophilus Andrews, Tully GRADE, MD  calcium  acetate (PHOSLO ) 667 MG capsule Take 2,668 mg by mouth See admin instructions. Take  4 capsules ( 2668 mg) by mouth with each meal and with each snacks 01/17/20  Yes [provider]  cinacalcet  (SENSIPAR ) 30 MG tablet Take 30 mg by mouth daily. 05/23/23  Yes [provider]  hydrALAZINE  (APRESOLINE ) 100 MG tablet Take 1 tablet (100 mg total) by mouth 2 (two) times daily. 06/12/22  Yes Theophilus Andrews, Tully GRADE, MD  metoprolol   succinate (TOPROL -XL) 50 MG 24 hr tablet Take 50 mg by mouth daily.   Yes [provider]  Multiple Vitamins-Minerals (MULTIVITAMIN MEN 50+ PO) Take 1 tablet by mouth daily.   Yes [provider]  olmesartan  (BENICAR ) 40 MG tablet Take 1 tablet (40 mg total) by mouth at bedtime. 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   Sleep apnea    does not use cpap   Vitamin D deficiency    Past Surgical History:  Procedure Laterality Date   A/V SHUNT INTERVENTION N/A 06/08/2023   Procedure: A/V SHUNT INTERVENTION;  Surgeon: Tobie Gordy POUR,  MD;  Location: [facility] INVASIVE CV LAB;  Service: Cardiovascular;  Laterality: N/A;   AV FISTULA PLACEMENT Left 02/09/2020   Procedure: LEFT ARM FISTULA CREATION;  Surgeon: Serene Gaile ORN, MD;  Location: [facility] OR;  Service: Vascular;  Laterality: Left;   COLONOSCOPY WITH PROPOFOL  N/A 02/10/2022   Procedure: COLONOSCOPY WITH PROPOFOL ;  Surgeon: Leigh Elspeth SQUIBB, MD;  [facility] ENDOSCOPY;  Service: Gastroenterology;  Laterality: N/A;   INSERTION OF DIALYSIS CATHETER Right 05/22/2020   Procedure: INSERTION OF TUNNELED DIALYSIS CATHETER;  Surgeon: Eliza Lonni RAMAN, MD;  Location: [facility] OR;  Service: Vascular;  Laterality: Right;   MENISCUS REPAIR Right 12/14   POLYPECTOMY  02/10/2022   Procedure: POLYPECTOMY;  Surgeon: Leigh Elspeth SQUIBB, MD;  Location: [facility] ENDOSCOPY;  Service: Gastroenterology;;   RADIOLOGY WITH ANESTHESIA N/A 04/03/2022   Procedure: MRI THORASIC SPINE WITH ANESTHESIA;  Surgeon: Radiologist, Medication, MD;  Location: [facility] OR;  Service: Radiology;  Laterality: N/A;   VENOUS ANGIOPLASTY Left 06/08/2023   Procedure: VENOUS ANGIOPLASTY;  Surgeon: Tobie Gordy POUR, MD;  Location: [facility] INVASIVE CV LAB;  Service: Cardiovascular;  Laterality: Left;  70% Outflow Cephalic Vein   Family History  Problem Relation Age of Onset   Prostate cancer Father    Diabetes Father    Drug abuse Father    Hyperlipidemia Sister    Stomach cancer Maternal Grandmother    Diabetes Maternal Grandfather    Heart disease Maternal Grandfather    Social History   Socioeconomic History   Marital status: Married    Spouse name: Not on file   Number of children: Not on file   Years of education: Not on file   Highest education level: Not on file  Occupational History   Not on file  Tobacco Use   Smoking status: Never   Smokeless tobacco: Never  Vaping Use   Vaping status: Never Used  Substance and Sexual Activity   Alcohol use: No    Alcohol/week: 0.0 standard drinks of alcohol   Drug use: No   Sexual activity: Yes  Other Topics Concern   Not on file  Social History Narrative   Not on file   Social Drivers of Health   Financial Resource Strain: Low Risk  (06/12/2022)   Overall Financial Resource Strain (CARDIA)    Difficulty of Paying Living Expenses: Not hard at all  Food Insecurity: No Food Insecurity (06/12/2022)   Hunger Vital Sign    Worried About Running Out of Food in the Last Year: Never true    Ran Out of Food in the Last Year: Never true  Transportation Needs: No Transportation Needs (06/12/2022)   PRAPARE - Scientist, research (physical sciences) (Medical): No    Lack of Transportation (Non-Medical): No  Physical Activity: Insufficiently Active (06/12/2022)   Exercise Vital Sign    Days of Exercise per Week: 3 days    Minutes of Exercise per Session: 40 min  Stress: No Stress Concern Present (06/12/2022)   [employer] of Occupational Health - Occupational Stress Questionnaire    Feeling of Stress : Not at all  Social Connections: Moderately Isolated (06/12/2022)   Social Connection and Isolation Panel    Frequency of Communication with Friends and Family: More than three times a week    Frequency of Social Gatherings with Friends and Family: Once a week    Attends Religious Services: Never    Database administrator or Organizations: No    Attends Banker  Meetings: Never    Marital Status: Married  Catering manager Violence: Not At Risk (06/12/2022)   Humiliation, Afraid, Rape, and Kick questionnaire    Fear of Current or Ex-Partner: No    Emotionally Abused: No    Physically Abused: No    Sexually Abused: No   Assessment and Plan:    Obstructive sleep apnea Suspected severe obstructive sleep apnea likely due to mechanical airway obstruction during sleep. - Order home sleep study to assess severity. - Discuss CPAP therapy if confirmed. - Educate on CPAP use, travel considerations, insurance coverage.   Morbid Obesity - Advise weight loss as long-term goal.     ROS-see HPI   + = positive Constitutional:    weight loss, night sweats, fevers, chills, fatigue, lassitude. HEENT:    headaches, difficulty swallowing, tooth/dental problems, sore throat,       sneezing, itching, ear ache, nasal congestion, post nasal drip, snoring CV:    chest pain, orthopnea, PND, swelling in lower extremities, anasarca,                                   dizziness, palpitations Resp:   shortness of breath with exertion or at rest.                productive cough,   non-productive cough, coughing up of blood.               change in color of mucus.  wheezing.   Skin:    rash or lesions. GI:  No-   heartburn, indigestion, abdominal pain, nausea, vomiting, diarrhea,                 change in bowel habits, loss of appetite GU: dysuria, change in color of urine, no urgency or frequency.   flank pain. MS:   joint pain, stiffness, decreased range of motion, back pain. Neuro-     nothing unusual Psych:  change in mood or affect.  depression or anxiety.   memory loss.  OBJ- Physical Exam General- Alert, Oriented, Affect-appropriate, Distress- none acute, +morbidly obese Skin- rash-none, lesions- none, excoriation- none Lymphadenopathy- none Head- atraumatic            Eyes- Gross vision intact, PERRLA, conjunctivae and secretions clear            Ears- Hearing, canals-normal            Nose- Clear, no-Septal dev, mucus, polyps, erosion, perforation             Throat- Mallampati III-IV , mucosa clear , drainage- none, tonsils+ residual,  Neck- flexible , trachea midline, no stridor , thyroid  nl, carotid no bruit Chest - symmetrical excursion , unlabored           Heart/CV- RRR/brisk no murmur , no gallop  , no rub, nl s1 s2                           - JVD- none , edema- none, stasis changes- none, varices- none           Lung- clear to P&A, wheeze- none, cough- none , dullness-none, rub- none           Chest wall-  Abd-  Br/ Gen/ Rectal- Not done, not indicated Extrem- +HD access L arm Neuro- grossly intact to observation

## 2023-09-20 DIAGNOSIS — Z992 Dependence on renal dialysis: Secondary | ICD-10-CM | POA: Diagnosis not present

## 2023-09-20 DIAGNOSIS — D509 Iron deficiency anemia, unspecified: Secondary | ICD-10-CM | POA: Diagnosis not present

## 2023-09-20 DIAGNOSIS — R82998 Other abnormal findings in urine: Secondary | ICD-10-CM | POA: Diagnosis not present

## 2023-09-20 DIAGNOSIS — E875 Hyperkalemia: Secondary | ICD-10-CM | POA: Diagnosis not present

## 2023-09-20 DIAGNOSIS — Z79899 Other long term (current) drug therapy: Secondary | ICD-10-CM | POA: Diagnosis not present

## 2023-09-20 DIAGNOSIS — E440 Moderate protein-calorie malnutrition: Secondary | ICD-10-CM | POA: Diagnosis not present

## 2023-09-20 DIAGNOSIS — R17 Unspecified jaundice: Secondary | ICD-10-CM | POA: Diagnosis not present

## 2023-09-20 DIAGNOSIS — D631 Anemia in chronic kidney disease: Secondary | ICD-10-CM | POA: Diagnosis not present

## 2023-09-20 DIAGNOSIS — N2581 Secondary hyperparathyroidism of renal origin: Secondary | ICD-10-CM | POA: Diagnosis not present

## 2023-09-20 DIAGNOSIS — N186 End stage renal disease: Secondary | ICD-10-CM | POA: Diagnosis not present

## 2023-09-22 ENCOUNTER — Encounter: Payer: Self-pay | Admitting: Internal Medicine

## 2023-09-22 ENCOUNTER — Ambulatory Visit: Admitting: Internal Medicine

## 2023-09-22 VITALS — BP 159/103 | HR 118 | Temp 98.4°F | Resp 18 | Ht 69.0 in | Wt 325.4 lb

## 2023-09-22 DIAGNOSIS — Z79899 Other long term (current) drug therapy: Secondary | ICD-10-CM | POA: Diagnosis not present

## 2023-09-22 DIAGNOSIS — R82998 Other abnormal findings in urine: Secondary | ICD-10-CM | POA: Diagnosis not present

## 2023-09-22 DIAGNOSIS — N186 End stage renal disease: Secondary | ICD-10-CM | POA: Diagnosis not present

## 2023-09-22 DIAGNOSIS — E440 Moderate protein-calorie malnutrition: Secondary | ICD-10-CM | POA: Diagnosis not present

## 2023-09-22 DIAGNOSIS — I10 Essential (primary) hypertension: Secondary | ICD-10-CM | POA: Diagnosis not present

## 2023-09-22 DIAGNOSIS — Z992 Dependence on renal dialysis: Secondary | ICD-10-CM | POA: Diagnosis not present

## 2023-09-22 DIAGNOSIS — N2581 Secondary hyperparathyroidism of renal origin: Secondary | ICD-10-CM | POA: Diagnosis not present

## 2023-09-22 DIAGNOSIS — G4733 Obstructive sleep apnea (adult) (pediatric): Secondary | ICD-10-CM | POA: Diagnosis not present

## 2023-09-22 DIAGNOSIS — E875 Hyperkalemia: Secondary | ICD-10-CM | POA: Diagnosis not present

## 2023-09-22 DIAGNOSIS — D631 Anemia in chronic kidney disease: Secondary | ICD-10-CM | POA: Diagnosis not present

## 2023-09-22 DIAGNOSIS — R17 Unspecified jaundice: Secondary | ICD-10-CM | POA: Diagnosis not present

## 2023-09-22 DIAGNOSIS — D509 Iron deficiency anemia, unspecified: Secondary | ICD-10-CM | POA: Diagnosis not present

## 2023-09-22 NOTE — Patient Instructions (Addendum)
 Order- schedule home sleep test   dx OSA  Please call us about 2 weeks after your sleep test for results and recommendations

## 2023-09-24 DIAGNOSIS — E875 Hyperkalemia: Secondary | ICD-10-CM | POA: Diagnosis not present

## 2023-09-24 DIAGNOSIS — R17 Unspecified jaundice: Secondary | ICD-10-CM | POA: Diagnosis not present

## 2023-09-24 DIAGNOSIS — Z79899 Other long term (current) drug therapy: Secondary | ICD-10-CM | POA: Diagnosis not present

## 2023-09-24 DIAGNOSIS — N2581 Secondary hyperparathyroidism of renal origin: Secondary | ICD-10-CM | POA: Diagnosis not present

## 2023-09-24 DIAGNOSIS — D631 Anemia in chronic kidney disease: Secondary | ICD-10-CM | POA: Diagnosis not present

## 2023-09-24 DIAGNOSIS — E440 Moderate protein-calorie malnutrition: Secondary | ICD-10-CM | POA: Diagnosis not present

## 2023-09-24 DIAGNOSIS — Z992 Dependence on renal dialysis: Secondary | ICD-10-CM | POA: Diagnosis not present

## 2023-09-24 DIAGNOSIS — D509 Iron deficiency anemia, unspecified: Secondary | ICD-10-CM | POA: Diagnosis not present

## 2023-09-24 DIAGNOSIS — N186 End stage renal disease: Secondary | ICD-10-CM | POA: Diagnosis not present

## 2023-09-24 DIAGNOSIS — R82998 Other abnormal findings in urine: Secondary | ICD-10-CM | POA: Diagnosis not present

## 2023-09-25 DIAGNOSIS — R82998 Other abnormal findings in urine: Secondary | ICD-10-CM | POA: Diagnosis not present

## 2023-09-25 DIAGNOSIS — D509 Iron deficiency anemia, unspecified: Secondary | ICD-10-CM | POA: Diagnosis not present

## 2023-09-25 DIAGNOSIS — Z992 Dependence on renal dialysis: Secondary | ICD-10-CM | POA: Diagnosis not present

## 2023-09-25 DIAGNOSIS — R17 Unspecified jaundice: Secondary | ICD-10-CM | POA: Diagnosis not present

## 2023-09-25 DIAGNOSIS — N2581 Secondary hyperparathyroidism of renal origin: Secondary | ICD-10-CM | POA: Diagnosis not present

## 2023-09-25 DIAGNOSIS — E875 Hyperkalemia: Secondary | ICD-10-CM | POA: Diagnosis not present

## 2023-09-25 DIAGNOSIS — E440 Moderate protein-calorie malnutrition: Secondary | ICD-10-CM | POA: Diagnosis not present

## 2023-09-25 DIAGNOSIS — Z79899 Other long term (current) drug therapy: Secondary | ICD-10-CM | POA: Diagnosis not present

## 2023-09-25 DIAGNOSIS — D631 Anemia in chronic kidney disease: Secondary | ICD-10-CM | POA: Diagnosis not present

## 2023-09-25 DIAGNOSIS — N186 End stage renal disease: Secondary | ICD-10-CM | POA: Diagnosis not present

## 2023-09-28 DIAGNOSIS — E440 Moderate protein-calorie malnutrition: Secondary | ICD-10-CM | POA: Diagnosis not present

## 2023-09-28 DIAGNOSIS — R82998 Other abnormal findings in urine: Secondary | ICD-10-CM | POA: Diagnosis not present

## 2023-09-28 DIAGNOSIS — Z79899 Other long term (current) drug therapy: Secondary | ICD-10-CM | POA: Diagnosis not present

## 2023-09-28 DIAGNOSIS — N2581 Secondary hyperparathyroidism of renal origin: Secondary | ICD-10-CM | POA: Diagnosis not present

## 2023-09-28 DIAGNOSIS — R17 Unspecified jaundice: Secondary | ICD-10-CM | POA: Diagnosis not present

## 2023-09-28 DIAGNOSIS — N186 End stage renal disease: Secondary | ICD-10-CM | POA: Diagnosis not present

## 2023-09-28 DIAGNOSIS — E875 Hyperkalemia: Secondary | ICD-10-CM | POA: Diagnosis not present

## 2023-09-28 DIAGNOSIS — D509 Iron deficiency anemia, unspecified: Secondary | ICD-10-CM | POA: Diagnosis not present

## 2023-09-28 DIAGNOSIS — Z992 Dependence on renal dialysis: Secondary | ICD-10-CM | POA: Diagnosis not present

## 2023-09-28 DIAGNOSIS — D631 Anemia in chronic kidney disease: Secondary | ICD-10-CM | POA: Diagnosis not present

## 2023-09-29 DIAGNOSIS — N186 End stage renal disease: Secondary | ICD-10-CM | POA: Diagnosis not present

## 2023-09-29 DIAGNOSIS — N2581 Secondary hyperparathyroidism of renal origin: Secondary | ICD-10-CM | POA: Diagnosis not present

## 2023-09-29 DIAGNOSIS — D631 Anemia in chronic kidney disease: Secondary | ICD-10-CM | POA: Diagnosis not present

## 2023-09-29 DIAGNOSIS — R82998 Other abnormal findings in urine: Secondary | ICD-10-CM | POA: Diagnosis not present

## 2023-09-29 DIAGNOSIS — D509 Iron deficiency anemia, unspecified: Secondary | ICD-10-CM | POA: Diagnosis not present

## 2023-09-29 DIAGNOSIS — Z992 Dependence on renal dialysis: Secondary | ICD-10-CM | POA: Diagnosis not present

## 2023-09-29 DIAGNOSIS — E440 Moderate protein-calorie malnutrition: Secondary | ICD-10-CM | POA: Diagnosis not present

## 2023-09-29 DIAGNOSIS — E875 Hyperkalemia: Secondary | ICD-10-CM | POA: Diagnosis not present

## 2023-09-29 DIAGNOSIS — Z79899 Other long term (current) drug therapy: Secondary | ICD-10-CM | POA: Diagnosis not present

## 2023-09-29 DIAGNOSIS — R17 Unspecified jaundice: Secondary | ICD-10-CM | POA: Diagnosis not present

## 2023-10-01 DIAGNOSIS — D509 Iron deficiency anemia, unspecified: Secondary | ICD-10-CM | POA: Diagnosis not present

## 2023-10-01 DIAGNOSIS — R17 Unspecified jaundice: Secondary | ICD-10-CM | POA: Diagnosis not present

## 2023-10-01 DIAGNOSIS — Z79899 Other long term (current) drug therapy: Secondary | ICD-10-CM | POA: Diagnosis not present

## 2023-10-01 DIAGNOSIS — Z992 Dependence on renal dialysis: Secondary | ICD-10-CM | POA: Diagnosis not present

## 2023-10-01 DIAGNOSIS — D631 Anemia in chronic kidney disease: Secondary | ICD-10-CM | POA: Diagnosis not present

## 2023-10-01 DIAGNOSIS — N186 End stage renal disease: Secondary | ICD-10-CM | POA: Diagnosis not present

## 2023-10-01 DIAGNOSIS — N2581 Secondary hyperparathyroidism of renal origin: Secondary | ICD-10-CM | POA: Diagnosis not present

## 2023-10-01 DIAGNOSIS — R82998 Other abnormal findings in urine: Secondary | ICD-10-CM | POA: Diagnosis not present

## 2023-10-01 DIAGNOSIS — N049 Nephrotic syndrome with unspecified morphologic changes: Secondary | ICD-10-CM | POA: Diagnosis not present

## 2023-10-01 DIAGNOSIS — E875 Hyperkalemia: Secondary | ICD-10-CM | POA: Diagnosis not present

## 2023-10-01 DIAGNOSIS — E440 Moderate protein-calorie malnutrition: Secondary | ICD-10-CM | POA: Diagnosis not present

## 2023-10-02 DIAGNOSIS — D509 Iron deficiency anemia, unspecified: Secondary | ICD-10-CM | POA: Diagnosis not present

## 2023-10-02 DIAGNOSIS — R17 Unspecified jaundice: Secondary | ICD-10-CM | POA: Diagnosis not present

## 2023-10-02 DIAGNOSIS — D631 Anemia in chronic kidney disease: Secondary | ICD-10-CM | POA: Diagnosis not present

## 2023-10-02 DIAGNOSIS — Z79899 Other long term (current) drug therapy: Secondary | ICD-10-CM | POA: Diagnosis not present

## 2023-10-02 DIAGNOSIS — E440 Moderate protein-calorie malnutrition: Secondary | ICD-10-CM | POA: Diagnosis not present

## 2023-10-02 DIAGNOSIS — R82998 Other abnormal findings in urine: Secondary | ICD-10-CM | POA: Diagnosis not present

## 2023-10-02 DIAGNOSIS — N2581 Secondary hyperparathyroidism of renal origin: Secondary | ICD-10-CM | POA: Diagnosis not present

## 2023-10-02 DIAGNOSIS — Z992 Dependence on renal dialysis: Secondary | ICD-10-CM | POA: Diagnosis not present

## 2023-10-02 DIAGNOSIS — E875 Hyperkalemia: Secondary | ICD-10-CM | POA: Diagnosis not present

## 2023-10-02 DIAGNOSIS — N186 End stage renal disease: Secondary | ICD-10-CM | POA: Diagnosis not present

## 2023-10-04 DIAGNOSIS — D631 Anemia in chronic kidney disease: Secondary | ICD-10-CM | POA: Diagnosis not present

## 2023-10-04 DIAGNOSIS — E875 Hyperkalemia: Secondary | ICD-10-CM | POA: Diagnosis not present

## 2023-10-04 DIAGNOSIS — D509 Iron deficiency anemia, unspecified: Secondary | ICD-10-CM | POA: Diagnosis not present

## 2023-10-04 DIAGNOSIS — N186 End stage renal disease: Secondary | ICD-10-CM | POA: Diagnosis not present

## 2023-10-04 DIAGNOSIS — Z992 Dependence on renal dialysis: Secondary | ICD-10-CM | POA: Diagnosis not present

## 2023-10-04 DIAGNOSIS — R17 Unspecified jaundice: Secondary | ICD-10-CM | POA: Diagnosis not present

## 2023-10-04 DIAGNOSIS — Z79899 Other long term (current) drug therapy: Secondary | ICD-10-CM | POA: Diagnosis not present

## 2023-10-04 DIAGNOSIS — R82998 Other abnormal findings in urine: Secondary | ICD-10-CM | POA: Diagnosis not present

## 2023-10-04 DIAGNOSIS — E440 Moderate protein-calorie malnutrition: Secondary | ICD-10-CM | POA: Diagnosis not present

## 2023-10-04 DIAGNOSIS — N2581 Secondary hyperparathyroidism of renal origin: Secondary | ICD-10-CM | POA: Diagnosis not present

## 2023-10-05 DIAGNOSIS — Z79899 Other long term (current) drug therapy: Secondary | ICD-10-CM | POA: Diagnosis not present

## 2023-10-05 DIAGNOSIS — N2581 Secondary hyperparathyroidism of renal origin: Secondary | ICD-10-CM | POA: Diagnosis not present

## 2023-10-05 DIAGNOSIS — D631 Anemia in chronic kidney disease: Secondary | ICD-10-CM | POA: Diagnosis not present

## 2023-10-05 DIAGNOSIS — E875 Hyperkalemia: Secondary | ICD-10-CM | POA: Diagnosis not present

## 2023-10-05 DIAGNOSIS — E440 Moderate protein-calorie malnutrition: Secondary | ICD-10-CM | POA: Diagnosis not present

## 2023-10-05 DIAGNOSIS — N186 End stage renal disease: Secondary | ICD-10-CM | POA: Diagnosis not present

## 2023-10-05 DIAGNOSIS — R82998 Other abnormal findings in urine: Secondary | ICD-10-CM | POA: Diagnosis not present

## 2023-10-05 DIAGNOSIS — D509 Iron deficiency anemia, unspecified: Secondary | ICD-10-CM | POA: Diagnosis not present

## 2023-10-05 DIAGNOSIS — Z992 Dependence on renal dialysis: Secondary | ICD-10-CM | POA: Diagnosis not present

## 2023-10-05 DIAGNOSIS — R17 Unspecified jaundice: Secondary | ICD-10-CM | POA: Diagnosis not present

## 2023-10-06 DIAGNOSIS — D631 Anemia in chronic kidney disease: Secondary | ICD-10-CM | POA: Diagnosis not present

## 2023-10-06 DIAGNOSIS — E875 Hyperkalemia: Secondary | ICD-10-CM | POA: Diagnosis not present

## 2023-10-06 DIAGNOSIS — D509 Iron deficiency anemia, unspecified: Secondary | ICD-10-CM | POA: Diagnosis not present

## 2023-10-06 DIAGNOSIS — R82998 Other abnormal findings in urine: Secondary | ICD-10-CM | POA: Diagnosis not present

## 2023-10-06 DIAGNOSIS — Z79899 Other long term (current) drug therapy: Secondary | ICD-10-CM | POA: Diagnosis not present

## 2023-10-06 DIAGNOSIS — Z992 Dependence on renal dialysis: Secondary | ICD-10-CM | POA: Diagnosis not present

## 2023-10-06 DIAGNOSIS — R17 Unspecified jaundice: Secondary | ICD-10-CM | POA: Diagnosis not present

## 2023-10-06 DIAGNOSIS — E440 Moderate protein-calorie malnutrition: Secondary | ICD-10-CM | POA: Diagnosis not present

## 2023-10-06 DIAGNOSIS — N186 End stage renal disease: Secondary | ICD-10-CM | POA: Diagnosis not present

## 2023-10-06 DIAGNOSIS — N2581 Secondary hyperparathyroidism of renal origin: Secondary | ICD-10-CM | POA: Diagnosis not present

## 2023-10-07 DIAGNOSIS — E440 Moderate protein-calorie malnutrition: Secondary | ICD-10-CM | POA: Diagnosis not present

## 2023-10-07 DIAGNOSIS — N2581 Secondary hyperparathyroidism of renal origin: Secondary | ICD-10-CM | POA: Diagnosis not present

## 2023-10-07 DIAGNOSIS — D631 Anemia in chronic kidney disease: Secondary | ICD-10-CM | POA: Diagnosis not present

## 2023-10-07 DIAGNOSIS — N186 End stage renal disease: Secondary | ICD-10-CM | POA: Diagnosis not present

## 2023-10-07 DIAGNOSIS — D509 Iron deficiency anemia, unspecified: Secondary | ICD-10-CM | POA: Diagnosis not present

## 2023-10-07 DIAGNOSIS — Z992 Dependence on renal dialysis: Secondary | ICD-10-CM | POA: Diagnosis not present

## 2023-10-07 DIAGNOSIS — E875 Hyperkalemia: Secondary | ICD-10-CM | POA: Diagnosis not present

## 2023-10-07 DIAGNOSIS — R82998 Other abnormal findings in urine: Secondary | ICD-10-CM | POA: Diagnosis not present

## 2023-10-07 DIAGNOSIS — Z79899 Other long term (current) drug therapy: Secondary | ICD-10-CM | POA: Diagnosis not present

## 2023-10-07 DIAGNOSIS — R17 Unspecified jaundice: Secondary | ICD-10-CM | POA: Diagnosis not present

## 2023-10-09 DIAGNOSIS — R17 Unspecified jaundice: Secondary | ICD-10-CM | POA: Diagnosis not present

## 2023-10-09 DIAGNOSIS — Z992 Dependence on renal dialysis: Secondary | ICD-10-CM | POA: Diagnosis not present

## 2023-10-09 DIAGNOSIS — E440 Moderate protein-calorie malnutrition: Secondary | ICD-10-CM | POA: Diagnosis not present

## 2023-10-09 DIAGNOSIS — N2581 Secondary hyperparathyroidism of renal origin: Secondary | ICD-10-CM | POA: Diagnosis not present

## 2023-10-09 DIAGNOSIS — R82998 Other abnormal findings in urine: Secondary | ICD-10-CM | POA: Diagnosis not present

## 2023-10-09 DIAGNOSIS — Z79899 Other long term (current) drug therapy: Secondary | ICD-10-CM | POA: Diagnosis not present

## 2023-10-09 DIAGNOSIS — D631 Anemia in chronic kidney disease: Secondary | ICD-10-CM | POA: Diagnosis not present

## 2023-10-09 DIAGNOSIS — N186 End stage renal disease: Secondary | ICD-10-CM | POA: Diagnosis not present

## 2023-10-09 DIAGNOSIS — D509 Iron deficiency anemia, unspecified: Secondary | ICD-10-CM | POA: Diagnosis not present

## 2023-10-09 DIAGNOSIS — E875 Hyperkalemia: Secondary | ICD-10-CM | POA: Diagnosis not present

## 2023-10-12 DIAGNOSIS — Z992 Dependence on renal dialysis: Secondary | ICD-10-CM | POA: Diagnosis not present

## 2023-10-12 DIAGNOSIS — R82998 Other abnormal findings in urine: Secondary | ICD-10-CM | POA: Diagnosis not present

## 2023-10-12 DIAGNOSIS — E875 Hyperkalemia: Secondary | ICD-10-CM | POA: Diagnosis not present

## 2023-10-12 DIAGNOSIS — D631 Anemia in chronic kidney disease: Secondary | ICD-10-CM | POA: Diagnosis not present

## 2023-10-12 DIAGNOSIS — R17 Unspecified jaundice: Secondary | ICD-10-CM | POA: Diagnosis not present

## 2023-10-12 DIAGNOSIS — N186 End stage renal disease: Secondary | ICD-10-CM | POA: Diagnosis not present

## 2023-10-12 DIAGNOSIS — Z79899 Other long term (current) drug therapy: Secondary | ICD-10-CM | POA: Diagnosis not present

## 2023-10-12 DIAGNOSIS — E440 Moderate protein-calorie malnutrition: Secondary | ICD-10-CM | POA: Diagnosis not present

## 2023-10-12 DIAGNOSIS — D509 Iron deficiency anemia, unspecified: Secondary | ICD-10-CM | POA: Diagnosis not present

## 2023-10-12 DIAGNOSIS — N2581 Secondary hyperparathyroidism of renal origin: Secondary | ICD-10-CM | POA: Diagnosis not present

## 2023-10-13 DIAGNOSIS — Z79899 Other long term (current) drug therapy: Secondary | ICD-10-CM | POA: Diagnosis not present

## 2023-10-13 DIAGNOSIS — E875 Hyperkalemia: Secondary | ICD-10-CM | POA: Diagnosis not present

## 2023-10-13 DIAGNOSIS — Z992 Dependence on renal dialysis: Secondary | ICD-10-CM | POA: Diagnosis not present

## 2023-10-13 DIAGNOSIS — D509 Iron deficiency anemia, unspecified: Secondary | ICD-10-CM | POA: Diagnosis not present

## 2023-10-13 DIAGNOSIS — R82998 Other abnormal findings in urine: Secondary | ICD-10-CM | POA: Diagnosis not present

## 2023-10-13 DIAGNOSIS — N186 End stage renal disease: Secondary | ICD-10-CM | POA: Diagnosis not present

## 2023-10-13 DIAGNOSIS — N2581 Secondary hyperparathyroidism of renal origin: Secondary | ICD-10-CM | POA: Diagnosis not present

## 2023-10-13 DIAGNOSIS — D631 Anemia in chronic kidney disease: Secondary | ICD-10-CM | POA: Diagnosis not present

## 2023-10-13 DIAGNOSIS — E440 Moderate protein-calorie malnutrition: Secondary | ICD-10-CM | POA: Diagnosis not present

## 2023-10-13 DIAGNOSIS — R17 Unspecified jaundice: Secondary | ICD-10-CM | POA: Diagnosis not present

## 2023-10-14 DIAGNOSIS — R17 Unspecified jaundice: Secondary | ICD-10-CM | POA: Diagnosis not present

## 2023-10-14 DIAGNOSIS — R82998 Other abnormal findings in urine: Secondary | ICD-10-CM | POA: Diagnosis not present

## 2023-10-14 DIAGNOSIS — Z992 Dependence on renal dialysis: Secondary | ICD-10-CM | POA: Diagnosis not present

## 2023-10-14 DIAGNOSIS — N186 End stage renal disease: Secondary | ICD-10-CM | POA: Diagnosis not present

## 2023-10-14 DIAGNOSIS — E875 Hyperkalemia: Secondary | ICD-10-CM | POA: Diagnosis not present

## 2023-10-14 DIAGNOSIS — Z79899 Other long term (current) drug therapy: Secondary | ICD-10-CM | POA: Diagnosis not present

## 2023-10-14 DIAGNOSIS — D509 Iron deficiency anemia, unspecified: Secondary | ICD-10-CM | POA: Diagnosis not present

## 2023-10-14 DIAGNOSIS — E440 Moderate protein-calorie malnutrition: Secondary | ICD-10-CM | POA: Diagnosis not present

## 2023-10-14 DIAGNOSIS — N2581 Secondary hyperparathyroidism of renal origin: Secondary | ICD-10-CM | POA: Diagnosis not present

## 2023-10-14 DIAGNOSIS — D631 Anemia in chronic kidney disease: Secondary | ICD-10-CM | POA: Diagnosis not present

## 2023-10-15 DIAGNOSIS — R17 Unspecified jaundice: Secondary | ICD-10-CM | POA: Diagnosis not present

## 2023-10-15 DIAGNOSIS — R82998 Other abnormal findings in urine: Secondary | ICD-10-CM | POA: Diagnosis not present

## 2023-10-15 DIAGNOSIS — D631 Anemia in chronic kidney disease: Secondary | ICD-10-CM | POA: Diagnosis not present

## 2023-10-15 DIAGNOSIS — E875 Hyperkalemia: Secondary | ICD-10-CM | POA: Diagnosis not present

## 2023-10-15 DIAGNOSIS — E440 Moderate protein-calorie malnutrition: Secondary | ICD-10-CM | POA: Diagnosis not present

## 2023-10-15 DIAGNOSIS — N186 End stage renal disease: Secondary | ICD-10-CM | POA: Diagnosis not present

## 2023-10-15 DIAGNOSIS — Z79899 Other long term (current) drug therapy: Secondary | ICD-10-CM | POA: Diagnosis not present

## 2023-10-15 DIAGNOSIS — D509 Iron deficiency anemia, unspecified: Secondary | ICD-10-CM | POA: Diagnosis not present

## 2023-10-15 DIAGNOSIS — Z992 Dependence on renal dialysis: Secondary | ICD-10-CM | POA: Diagnosis not present

## 2023-10-15 DIAGNOSIS — N2581 Secondary hyperparathyroidism of renal origin: Secondary | ICD-10-CM | POA: Diagnosis not present

## 2023-10-16 DIAGNOSIS — N186 End stage renal disease: Secondary | ICD-10-CM | POA: Diagnosis not present

## 2023-10-16 DIAGNOSIS — D631 Anemia in chronic kidney disease: Secondary | ICD-10-CM | POA: Diagnosis not present

## 2023-10-16 DIAGNOSIS — Z79899 Other long term (current) drug therapy: Secondary | ICD-10-CM | POA: Diagnosis not present

## 2023-10-16 DIAGNOSIS — E440 Moderate protein-calorie malnutrition: Secondary | ICD-10-CM | POA: Diagnosis not present

## 2023-10-16 DIAGNOSIS — R82998 Other abnormal findings in urine: Secondary | ICD-10-CM | POA: Diagnosis not present

## 2023-10-16 DIAGNOSIS — Z992 Dependence on renal dialysis: Secondary | ICD-10-CM | POA: Diagnosis not present

## 2023-10-16 DIAGNOSIS — D509 Iron deficiency anemia, unspecified: Secondary | ICD-10-CM | POA: Diagnosis not present

## 2023-10-16 DIAGNOSIS — R17 Unspecified jaundice: Secondary | ICD-10-CM | POA: Diagnosis not present

## 2023-10-16 DIAGNOSIS — E875 Hyperkalemia: Secondary | ICD-10-CM | POA: Diagnosis not present

## 2023-10-16 DIAGNOSIS — N2581 Secondary hyperparathyroidism of renal origin: Secondary | ICD-10-CM | POA: Diagnosis not present

## 2023-10-20 DIAGNOSIS — N186 End stage renal disease: Secondary | ICD-10-CM | POA: Diagnosis not present

## 2023-10-20 DIAGNOSIS — Z79899 Other long term (current) drug therapy: Secondary | ICD-10-CM | POA: Diagnosis not present

## 2023-10-20 DIAGNOSIS — E875 Hyperkalemia: Secondary | ICD-10-CM | POA: Diagnosis not present

## 2023-10-20 DIAGNOSIS — D509 Iron deficiency anemia, unspecified: Secondary | ICD-10-CM | POA: Diagnosis not present

## 2023-10-20 DIAGNOSIS — D631 Anemia in chronic kidney disease: Secondary | ICD-10-CM | POA: Diagnosis not present

## 2023-10-20 DIAGNOSIS — N2581 Secondary hyperparathyroidism of renal origin: Secondary | ICD-10-CM | POA: Diagnosis not present

## 2023-10-20 DIAGNOSIS — E440 Moderate protein-calorie malnutrition: Secondary | ICD-10-CM | POA: Diagnosis not present

## 2023-10-20 DIAGNOSIS — R82998 Other abnormal findings in urine: Secondary | ICD-10-CM | POA: Diagnosis not present

## 2023-10-20 DIAGNOSIS — R17 Unspecified jaundice: Secondary | ICD-10-CM | POA: Diagnosis not present

## 2023-10-20 DIAGNOSIS — Z992 Dependence on renal dialysis: Secondary | ICD-10-CM | POA: Diagnosis not present

## 2023-10-21 DIAGNOSIS — N186 End stage renal disease: Secondary | ICD-10-CM | POA: Diagnosis not present

## 2023-10-21 DIAGNOSIS — R17 Unspecified jaundice: Secondary | ICD-10-CM | POA: Diagnosis not present

## 2023-10-21 DIAGNOSIS — D509 Iron deficiency anemia, unspecified: Secondary | ICD-10-CM | POA: Diagnosis not present

## 2023-10-21 DIAGNOSIS — Z79899 Other long term (current) drug therapy: Secondary | ICD-10-CM | POA: Diagnosis not present

## 2023-10-21 DIAGNOSIS — D631 Anemia in chronic kidney disease: Secondary | ICD-10-CM | POA: Diagnosis not present

## 2023-10-21 DIAGNOSIS — Z992 Dependence on renal dialysis: Secondary | ICD-10-CM | POA: Diagnosis not present

## 2023-10-21 DIAGNOSIS — N2581 Secondary hyperparathyroidism of renal origin: Secondary | ICD-10-CM | POA: Diagnosis not present

## 2023-10-21 DIAGNOSIS — R82998 Other abnormal findings in urine: Secondary | ICD-10-CM | POA: Diagnosis not present

## 2023-10-21 DIAGNOSIS — E875 Hyperkalemia: Secondary | ICD-10-CM | POA: Diagnosis not present

## 2023-10-21 DIAGNOSIS — E440 Moderate protein-calorie malnutrition: Secondary | ICD-10-CM | POA: Diagnosis not present

## 2023-10-24 DIAGNOSIS — Z992 Dependence on renal dialysis: Secondary | ICD-10-CM | POA: Diagnosis not present

## 2023-10-24 DIAGNOSIS — R82998 Other abnormal findings in urine: Secondary | ICD-10-CM | POA: Diagnosis not present

## 2023-10-24 DIAGNOSIS — Z79899 Other long term (current) drug therapy: Secondary | ICD-10-CM | POA: Diagnosis not present

## 2023-10-24 DIAGNOSIS — N186 End stage renal disease: Secondary | ICD-10-CM | POA: Diagnosis not present

## 2023-10-24 DIAGNOSIS — E875 Hyperkalemia: Secondary | ICD-10-CM | POA: Diagnosis not present

## 2023-10-24 DIAGNOSIS — E440 Moderate protein-calorie malnutrition: Secondary | ICD-10-CM | POA: Diagnosis not present

## 2023-10-24 DIAGNOSIS — D631 Anemia in chronic kidney disease: Secondary | ICD-10-CM | POA: Diagnosis not present

## 2023-10-24 DIAGNOSIS — R17 Unspecified jaundice: Secondary | ICD-10-CM | POA: Diagnosis not present

## 2023-10-24 DIAGNOSIS — N2581 Secondary hyperparathyroidism of renal origin: Secondary | ICD-10-CM | POA: Diagnosis not present

## 2023-10-24 DIAGNOSIS — D509 Iron deficiency anemia, unspecified: Secondary | ICD-10-CM | POA: Diagnosis not present

## 2023-10-25 DIAGNOSIS — E440 Moderate protein-calorie malnutrition: Secondary | ICD-10-CM | POA: Diagnosis not present

## 2023-10-25 DIAGNOSIS — E875 Hyperkalemia: Secondary | ICD-10-CM | POA: Diagnosis not present

## 2023-10-25 DIAGNOSIS — R17 Unspecified jaundice: Secondary | ICD-10-CM | POA: Diagnosis not present

## 2023-10-25 DIAGNOSIS — D509 Iron deficiency anemia, unspecified: Secondary | ICD-10-CM | POA: Diagnosis not present

## 2023-10-25 DIAGNOSIS — Z992 Dependence on renal dialysis: Secondary | ICD-10-CM | POA: Diagnosis not present

## 2023-10-25 DIAGNOSIS — N186 End stage renal disease: Secondary | ICD-10-CM | POA: Diagnosis not present

## 2023-10-25 DIAGNOSIS — D631 Anemia in chronic kidney disease: Secondary | ICD-10-CM | POA: Diagnosis not present

## 2023-10-25 DIAGNOSIS — N2581 Secondary hyperparathyroidism of renal origin: Secondary | ICD-10-CM | POA: Diagnosis not present

## 2023-10-25 DIAGNOSIS — R82998 Other abnormal findings in urine: Secondary | ICD-10-CM | POA: Diagnosis not present

## 2023-10-25 DIAGNOSIS — Z79899 Other long term (current) drug therapy: Secondary | ICD-10-CM | POA: Diagnosis not present

## 2023-10-26 DIAGNOSIS — N186 End stage renal disease: Secondary | ICD-10-CM | POA: Diagnosis not present

## 2023-10-26 DIAGNOSIS — Z79899 Other long term (current) drug therapy: Secondary | ICD-10-CM | POA: Diagnosis not present

## 2023-10-26 DIAGNOSIS — E440 Moderate protein-calorie malnutrition: Secondary | ICD-10-CM | POA: Diagnosis not present

## 2023-10-26 DIAGNOSIS — R17 Unspecified jaundice: Secondary | ICD-10-CM | POA: Diagnosis not present

## 2023-10-26 DIAGNOSIS — D631 Anemia in chronic kidney disease: Secondary | ICD-10-CM | POA: Diagnosis not present

## 2023-10-26 DIAGNOSIS — D509 Iron deficiency anemia, unspecified: Secondary | ICD-10-CM | POA: Diagnosis not present

## 2023-10-26 DIAGNOSIS — Z992 Dependence on renal dialysis: Secondary | ICD-10-CM | POA: Diagnosis not present

## 2023-10-26 DIAGNOSIS — N2581 Secondary hyperparathyroidism of renal origin: Secondary | ICD-10-CM | POA: Diagnosis not present

## 2023-10-26 DIAGNOSIS — E875 Hyperkalemia: Secondary | ICD-10-CM | POA: Diagnosis not present

## 2023-10-26 DIAGNOSIS — R82998 Other abnormal findings in urine: Secondary | ICD-10-CM | POA: Diagnosis not present

## 2023-10-27 DIAGNOSIS — R82998 Other abnormal findings in urine: Secondary | ICD-10-CM | POA: Diagnosis not present

## 2023-10-27 DIAGNOSIS — N2581 Secondary hyperparathyroidism of renal origin: Secondary | ICD-10-CM | POA: Diagnosis not present

## 2023-10-27 DIAGNOSIS — R17 Unspecified jaundice: Secondary | ICD-10-CM | POA: Diagnosis not present

## 2023-10-27 DIAGNOSIS — D509 Iron deficiency anemia, unspecified: Secondary | ICD-10-CM | POA: Diagnosis not present

## 2023-10-27 DIAGNOSIS — N186 End stage renal disease: Secondary | ICD-10-CM | POA: Diagnosis not present

## 2023-10-27 DIAGNOSIS — E875 Hyperkalemia: Secondary | ICD-10-CM | POA: Diagnosis not present

## 2023-10-27 DIAGNOSIS — Z79899 Other long term (current) drug therapy: Secondary | ICD-10-CM | POA: Diagnosis not present

## 2023-10-27 DIAGNOSIS — E440 Moderate protein-calorie malnutrition: Secondary | ICD-10-CM | POA: Diagnosis not present

## 2023-10-27 DIAGNOSIS — Z992 Dependence on renal dialysis: Secondary | ICD-10-CM | POA: Diagnosis not present

## 2023-10-27 DIAGNOSIS — D631 Anemia in chronic kidney disease: Secondary | ICD-10-CM | POA: Diagnosis not present

## 2023-10-29 DIAGNOSIS — N186 End stage renal disease: Secondary | ICD-10-CM | POA: Diagnosis not present

## 2023-10-29 DIAGNOSIS — N2581 Secondary hyperparathyroidism of renal origin: Secondary | ICD-10-CM | POA: Diagnosis not present

## 2023-10-29 DIAGNOSIS — R82998 Other abnormal findings in urine: Secondary | ICD-10-CM | POA: Diagnosis not present

## 2023-10-29 DIAGNOSIS — Z79899 Other long term (current) drug therapy: Secondary | ICD-10-CM | POA: Diagnosis not present

## 2023-10-29 DIAGNOSIS — D509 Iron deficiency anemia, unspecified: Secondary | ICD-10-CM | POA: Diagnosis not present

## 2023-10-29 DIAGNOSIS — E875 Hyperkalemia: Secondary | ICD-10-CM | POA: Diagnosis not present

## 2023-10-29 DIAGNOSIS — Z992 Dependence on renal dialysis: Secondary | ICD-10-CM | POA: Diagnosis not present

## 2023-10-29 DIAGNOSIS — R17 Unspecified jaundice: Secondary | ICD-10-CM | POA: Diagnosis not present

## 2023-10-29 DIAGNOSIS — E440 Moderate protein-calorie malnutrition: Secondary | ICD-10-CM | POA: Diagnosis not present

## 2023-10-29 DIAGNOSIS — D631 Anemia in chronic kidney disease: Secondary | ICD-10-CM | POA: Diagnosis not present

## 2023-10-30 DIAGNOSIS — Z79899 Other long term (current) drug therapy: Secondary | ICD-10-CM | POA: Diagnosis not present

## 2023-10-30 DIAGNOSIS — N2581 Secondary hyperparathyroidism of renal origin: Secondary | ICD-10-CM | POA: Diagnosis not present

## 2023-10-30 DIAGNOSIS — D509 Iron deficiency anemia, unspecified: Secondary | ICD-10-CM | POA: Diagnosis not present

## 2023-10-30 DIAGNOSIS — R17 Unspecified jaundice: Secondary | ICD-10-CM | POA: Diagnosis not present

## 2023-10-30 DIAGNOSIS — D631 Anemia in chronic kidney disease: Secondary | ICD-10-CM | POA: Diagnosis not present

## 2023-10-30 DIAGNOSIS — E875 Hyperkalemia: Secondary | ICD-10-CM | POA: Diagnosis not present

## 2023-10-30 DIAGNOSIS — Z992 Dependence on renal dialysis: Secondary | ICD-10-CM | POA: Diagnosis not present

## 2023-10-30 DIAGNOSIS — R82998 Other abnormal findings in urine: Secondary | ICD-10-CM | POA: Diagnosis not present

## 2023-10-30 DIAGNOSIS — N186 End stage renal disease: Secondary | ICD-10-CM | POA: Diagnosis not present

## 2023-10-30 DIAGNOSIS — E440 Moderate protein-calorie malnutrition: Secondary | ICD-10-CM | POA: Diagnosis not present

## 2023-11-01 DIAGNOSIS — Z992 Dependence on renal dialysis: Secondary | ICD-10-CM | POA: Diagnosis not present

## 2023-11-01 DIAGNOSIS — N186 End stage renal disease: Secondary | ICD-10-CM | POA: Diagnosis not present

## 2023-11-02 DIAGNOSIS — D509 Iron deficiency anemia, unspecified: Secondary | ICD-10-CM | POA: Diagnosis not present

## 2023-11-02 DIAGNOSIS — N2581 Secondary hyperparathyroidism of renal origin: Secondary | ICD-10-CM | POA: Diagnosis not present

## 2023-11-02 DIAGNOSIS — N2589 Other disorders resulting from impaired renal tubular function: Secondary | ICD-10-CM | POA: Diagnosis not present

## 2023-11-02 DIAGNOSIS — Z992 Dependence on renal dialysis: Secondary | ICD-10-CM | POA: Diagnosis not present

## 2023-11-02 DIAGNOSIS — N186 End stage renal disease: Secondary | ICD-10-CM | POA: Diagnosis not present

## 2023-11-02 DIAGNOSIS — R82998 Other abnormal findings in urine: Secondary | ICD-10-CM | POA: Diagnosis not present

## 2023-11-02 DIAGNOSIS — E875 Hyperkalemia: Secondary | ICD-10-CM | POA: Diagnosis not present

## 2023-11-02 DIAGNOSIS — D631 Anemia in chronic kidney disease: Secondary | ICD-10-CM | POA: Diagnosis not present

## 2023-11-03 DIAGNOSIS — N2589 Other disorders resulting from impaired renal tubular function: Secondary | ICD-10-CM | POA: Diagnosis not present

## 2023-11-03 DIAGNOSIS — N186 End stage renal disease: Secondary | ICD-10-CM | POA: Diagnosis not present

## 2023-11-03 DIAGNOSIS — D509 Iron deficiency anemia, unspecified: Secondary | ICD-10-CM | POA: Diagnosis not present

## 2023-11-03 DIAGNOSIS — R82998 Other abnormal findings in urine: Secondary | ICD-10-CM | POA: Diagnosis not present

## 2023-11-03 DIAGNOSIS — Z992 Dependence on renal dialysis: Secondary | ICD-10-CM | POA: Diagnosis not present

## 2023-11-03 DIAGNOSIS — D631 Anemia in chronic kidney disease: Secondary | ICD-10-CM | POA: Diagnosis not present

## 2023-11-03 DIAGNOSIS — N2581 Secondary hyperparathyroidism of renal origin: Secondary | ICD-10-CM | POA: Diagnosis not present

## 2023-11-03 DIAGNOSIS — E875 Hyperkalemia: Secondary | ICD-10-CM | POA: Diagnosis not present

## 2023-11-06 ENCOUNTER — Ambulatory Visit

## 2023-11-06 DIAGNOSIS — D631 Anemia in chronic kidney disease: Secondary | ICD-10-CM | POA: Diagnosis not present

## 2023-11-06 DIAGNOSIS — N2581 Secondary hyperparathyroidism of renal origin: Secondary | ICD-10-CM | POA: Diagnosis not present

## 2023-11-06 DIAGNOSIS — E875 Hyperkalemia: Secondary | ICD-10-CM | POA: Diagnosis not present

## 2023-11-06 DIAGNOSIS — N186 End stage renal disease: Secondary | ICD-10-CM | POA: Diagnosis not present

## 2023-11-06 DIAGNOSIS — R82998 Other abnormal findings in urine: Secondary | ICD-10-CM | POA: Diagnosis not present

## 2023-11-06 DIAGNOSIS — N2589 Other disorders resulting from impaired renal tubular function: Secondary | ICD-10-CM | POA: Diagnosis not present

## 2023-11-06 DIAGNOSIS — Z992 Dependence on renal dialysis: Secondary | ICD-10-CM | POA: Diagnosis not present

## 2023-11-06 DIAGNOSIS — D509 Iron deficiency anemia, unspecified: Secondary | ICD-10-CM | POA: Diagnosis not present

## 2023-11-07 DIAGNOSIS — E875 Hyperkalemia: Secondary | ICD-10-CM | POA: Diagnosis not present

## 2023-11-07 DIAGNOSIS — N2581 Secondary hyperparathyroidism of renal origin: Secondary | ICD-10-CM | POA: Diagnosis not present

## 2023-11-07 DIAGNOSIS — D631 Anemia in chronic kidney disease: Secondary | ICD-10-CM | POA: Diagnosis not present

## 2023-11-07 DIAGNOSIS — N2589 Other disorders resulting from impaired renal tubular function: Secondary | ICD-10-CM | POA: Diagnosis not present

## 2023-11-07 DIAGNOSIS — N186 End stage renal disease: Secondary | ICD-10-CM | POA: Diagnosis not present

## 2023-11-07 DIAGNOSIS — Z992 Dependence on renal dialysis: Secondary | ICD-10-CM | POA: Diagnosis not present

## 2023-11-07 DIAGNOSIS — D509 Iron deficiency anemia, unspecified: Secondary | ICD-10-CM | POA: Diagnosis not present

## 2023-11-07 DIAGNOSIS — R82998 Other abnormal findings in urine: Secondary | ICD-10-CM | POA: Diagnosis not present

## 2023-11-08 DIAGNOSIS — D631 Anemia in chronic kidney disease: Secondary | ICD-10-CM | POA: Diagnosis not present

## 2023-11-08 DIAGNOSIS — D509 Iron deficiency anemia, unspecified: Secondary | ICD-10-CM | POA: Diagnosis not present

## 2023-11-08 DIAGNOSIS — N186 End stage renal disease: Secondary | ICD-10-CM | POA: Diagnosis not present

## 2023-11-08 DIAGNOSIS — N2589 Other disorders resulting from impaired renal tubular function: Secondary | ICD-10-CM | POA: Diagnosis not present

## 2023-11-08 DIAGNOSIS — E875 Hyperkalemia: Secondary | ICD-10-CM | POA: Diagnosis not present

## 2023-11-08 DIAGNOSIS — R82998 Other abnormal findings in urine: Secondary | ICD-10-CM | POA: Diagnosis not present

## 2023-11-08 DIAGNOSIS — Z992 Dependence on renal dialysis: Secondary | ICD-10-CM | POA: Diagnosis not present

## 2023-11-08 DIAGNOSIS — N2581 Secondary hyperparathyroidism of renal origin: Secondary | ICD-10-CM | POA: Diagnosis not present

## 2023-11-11 DIAGNOSIS — N2581 Secondary hyperparathyroidism of renal origin: Secondary | ICD-10-CM | POA: Diagnosis not present

## 2023-11-11 DIAGNOSIS — N2589 Other disorders resulting from impaired renal tubular function: Secondary | ICD-10-CM | POA: Diagnosis not present

## 2023-11-11 DIAGNOSIS — D631 Anemia in chronic kidney disease: Secondary | ICD-10-CM | POA: Diagnosis not present

## 2023-11-11 DIAGNOSIS — R82998 Other abnormal findings in urine: Secondary | ICD-10-CM | POA: Diagnosis not present

## 2023-11-11 DIAGNOSIS — D509 Iron deficiency anemia, unspecified: Secondary | ICD-10-CM | POA: Diagnosis not present

## 2023-11-11 DIAGNOSIS — E875 Hyperkalemia: Secondary | ICD-10-CM | POA: Diagnosis not present

## 2023-11-11 DIAGNOSIS — Z992 Dependence on renal dialysis: Secondary | ICD-10-CM | POA: Diagnosis not present

## 2023-11-11 DIAGNOSIS — N186 End stage renal disease: Secondary | ICD-10-CM | POA: Diagnosis not present

## 2023-11-12 DIAGNOSIS — N2581 Secondary hyperparathyroidism of renal origin: Secondary | ICD-10-CM | POA: Diagnosis not present

## 2023-11-12 DIAGNOSIS — R82998 Other abnormal findings in urine: Secondary | ICD-10-CM | POA: Diagnosis not present

## 2023-11-12 DIAGNOSIS — Z992 Dependence on renal dialysis: Secondary | ICD-10-CM | POA: Diagnosis not present

## 2023-11-12 DIAGNOSIS — N2589 Other disorders resulting from impaired renal tubular function: Secondary | ICD-10-CM | POA: Diagnosis not present

## 2023-11-12 DIAGNOSIS — N186 End stage renal disease: Secondary | ICD-10-CM | POA: Diagnosis not present

## 2023-11-12 DIAGNOSIS — E875 Hyperkalemia: Secondary | ICD-10-CM | POA: Diagnosis not present

## 2023-11-12 DIAGNOSIS — D509 Iron deficiency anemia, unspecified: Secondary | ICD-10-CM | POA: Diagnosis not present

## 2023-11-12 DIAGNOSIS — D631 Anemia in chronic kidney disease: Secondary | ICD-10-CM | POA: Diagnosis not present

## 2023-11-13 DIAGNOSIS — D509 Iron deficiency anemia, unspecified: Secondary | ICD-10-CM | POA: Diagnosis not present

## 2023-11-13 DIAGNOSIS — E875 Hyperkalemia: Secondary | ICD-10-CM | POA: Diagnosis not present

## 2023-11-13 DIAGNOSIS — N2589 Other disorders resulting from impaired renal tubular function: Secondary | ICD-10-CM | POA: Diagnosis not present

## 2023-11-13 DIAGNOSIS — D631 Anemia in chronic kidney disease: Secondary | ICD-10-CM | POA: Diagnosis not present

## 2023-11-13 DIAGNOSIS — N186 End stage renal disease: Secondary | ICD-10-CM | POA: Diagnosis not present

## 2023-11-13 DIAGNOSIS — Z992 Dependence on renal dialysis: Secondary | ICD-10-CM | POA: Diagnosis not present

## 2023-11-13 DIAGNOSIS — N2581 Secondary hyperparathyroidism of renal origin: Secondary | ICD-10-CM | POA: Diagnosis not present

## 2023-11-13 DIAGNOSIS — R82998 Other abnormal findings in urine: Secondary | ICD-10-CM | POA: Diagnosis not present

## 2023-11-14 DIAGNOSIS — N186 End stage renal disease: Secondary | ICD-10-CM | POA: Diagnosis not present

## 2023-11-14 DIAGNOSIS — D631 Anemia in chronic kidney disease: Secondary | ICD-10-CM | POA: Diagnosis not present

## 2023-11-14 DIAGNOSIS — N2589 Other disorders resulting from impaired renal tubular function: Secondary | ICD-10-CM | POA: Diagnosis not present

## 2023-11-14 DIAGNOSIS — N2581 Secondary hyperparathyroidism of renal origin: Secondary | ICD-10-CM | POA: Diagnosis not present

## 2023-11-14 DIAGNOSIS — R82998 Other abnormal findings in urine: Secondary | ICD-10-CM | POA: Diagnosis not present

## 2023-11-14 DIAGNOSIS — Z992 Dependence on renal dialysis: Secondary | ICD-10-CM | POA: Diagnosis not present

## 2023-11-14 DIAGNOSIS — E875 Hyperkalemia: Secondary | ICD-10-CM | POA: Diagnosis not present

## 2023-11-14 DIAGNOSIS — D509 Iron deficiency anemia, unspecified: Secondary | ICD-10-CM | POA: Diagnosis not present

## 2023-11-15 DIAGNOSIS — N186 End stage renal disease: Secondary | ICD-10-CM | POA: Diagnosis not present

## 2023-11-15 DIAGNOSIS — R82998 Other abnormal findings in urine: Secondary | ICD-10-CM | POA: Diagnosis not present

## 2023-11-15 DIAGNOSIS — N2589 Other disorders resulting from impaired renal tubular function: Secondary | ICD-10-CM | POA: Diagnosis not present

## 2023-11-15 DIAGNOSIS — D509 Iron deficiency anemia, unspecified: Secondary | ICD-10-CM | POA: Diagnosis not present

## 2023-11-15 DIAGNOSIS — N2581 Secondary hyperparathyroidism of renal origin: Secondary | ICD-10-CM | POA: Diagnosis not present

## 2023-11-15 DIAGNOSIS — Z992 Dependence on renal dialysis: Secondary | ICD-10-CM | POA: Diagnosis not present

## 2023-11-15 DIAGNOSIS — E875 Hyperkalemia: Secondary | ICD-10-CM | POA: Diagnosis not present

## 2023-11-15 DIAGNOSIS — D631 Anemia in chronic kidney disease: Secondary | ICD-10-CM | POA: Diagnosis not present

## 2023-11-18 DIAGNOSIS — N186 End stage renal disease: Secondary | ICD-10-CM | POA: Diagnosis not present

## 2023-11-18 DIAGNOSIS — N2581 Secondary hyperparathyroidism of renal origin: Secondary | ICD-10-CM | POA: Diagnosis not present

## 2023-11-18 DIAGNOSIS — D631 Anemia in chronic kidney disease: Secondary | ICD-10-CM | POA: Diagnosis not present

## 2023-11-18 DIAGNOSIS — E875 Hyperkalemia: Secondary | ICD-10-CM | POA: Diagnosis not present

## 2023-11-18 DIAGNOSIS — R82998 Other abnormal findings in urine: Secondary | ICD-10-CM | POA: Diagnosis not present

## 2023-11-18 DIAGNOSIS — D509 Iron deficiency anemia, unspecified: Secondary | ICD-10-CM | POA: Diagnosis not present

## 2023-11-18 DIAGNOSIS — Z992 Dependence on renal dialysis: Secondary | ICD-10-CM | POA: Diagnosis not present

## 2023-11-18 DIAGNOSIS — N2589 Other disorders resulting from impaired renal tubular function: Secondary | ICD-10-CM | POA: Diagnosis not present

## 2023-11-20 DIAGNOSIS — N2589 Other disorders resulting from impaired renal tubular function: Secondary | ICD-10-CM | POA: Diagnosis not present

## 2023-11-20 DIAGNOSIS — D509 Iron deficiency anemia, unspecified: Secondary | ICD-10-CM | POA: Diagnosis not present

## 2023-11-20 DIAGNOSIS — D631 Anemia in chronic kidney disease: Secondary | ICD-10-CM | POA: Diagnosis not present

## 2023-11-20 DIAGNOSIS — E875 Hyperkalemia: Secondary | ICD-10-CM | POA: Diagnosis not present

## 2023-11-20 DIAGNOSIS — N186 End stage renal disease: Secondary | ICD-10-CM | POA: Diagnosis not present

## 2023-11-20 DIAGNOSIS — R82998 Other abnormal findings in urine: Secondary | ICD-10-CM | POA: Diagnosis not present

## 2023-11-20 DIAGNOSIS — N2581 Secondary hyperparathyroidism of renal origin: Secondary | ICD-10-CM | POA: Diagnosis not present

## 2023-11-20 DIAGNOSIS — Z992 Dependence on renal dialysis: Secondary | ICD-10-CM | POA: Diagnosis not present

## 2023-11-21 DIAGNOSIS — N2589 Other disorders resulting from impaired renal tubular function: Secondary | ICD-10-CM | POA: Diagnosis not present

## 2023-11-21 DIAGNOSIS — D509 Iron deficiency anemia, unspecified: Secondary | ICD-10-CM | POA: Diagnosis not present

## 2023-11-21 DIAGNOSIS — E875 Hyperkalemia: Secondary | ICD-10-CM | POA: Diagnosis not present

## 2023-11-21 DIAGNOSIS — Z992 Dependence on renal dialysis: Secondary | ICD-10-CM | POA: Diagnosis not present

## 2023-11-21 DIAGNOSIS — D631 Anemia in chronic kidney disease: Secondary | ICD-10-CM | POA: Diagnosis not present

## 2023-11-21 DIAGNOSIS — N186 End stage renal disease: Secondary | ICD-10-CM | POA: Diagnosis not present

## 2023-11-21 DIAGNOSIS — N2581 Secondary hyperparathyroidism of renal origin: Secondary | ICD-10-CM | POA: Diagnosis not present

## 2023-11-21 DIAGNOSIS — R82998 Other abnormal findings in urine: Secondary | ICD-10-CM | POA: Diagnosis not present

## 2023-11-22 DIAGNOSIS — Z992 Dependence on renal dialysis: Secondary | ICD-10-CM | POA: Diagnosis not present

## 2023-11-22 DIAGNOSIS — N2581 Secondary hyperparathyroidism of renal origin: Secondary | ICD-10-CM | POA: Diagnosis not present

## 2023-11-22 DIAGNOSIS — N186 End stage renal disease: Secondary | ICD-10-CM | POA: Diagnosis not present

## 2023-11-22 DIAGNOSIS — R82998 Other abnormal findings in urine: Secondary | ICD-10-CM | POA: Diagnosis not present

## 2023-11-22 DIAGNOSIS — E875 Hyperkalemia: Secondary | ICD-10-CM | POA: Diagnosis not present

## 2023-11-22 DIAGNOSIS — D631 Anemia in chronic kidney disease: Secondary | ICD-10-CM | POA: Diagnosis not present

## 2023-11-22 DIAGNOSIS — N2589 Other disorders resulting from impaired renal tubular function: Secondary | ICD-10-CM | POA: Diagnosis not present

## 2023-11-22 DIAGNOSIS — D509 Iron deficiency anemia, unspecified: Secondary | ICD-10-CM | POA: Diagnosis not present

## 2023-11-23 ENCOUNTER — Telehealth: Payer: Self-pay

## 2023-11-23 DIAGNOSIS — D631 Anemia in chronic kidney disease: Secondary | ICD-10-CM | POA: Diagnosis not present

## 2023-11-23 DIAGNOSIS — N2581 Secondary hyperparathyroidism of renal origin: Secondary | ICD-10-CM | POA: Diagnosis not present

## 2023-11-23 DIAGNOSIS — N186 End stage renal disease: Secondary | ICD-10-CM | POA: Diagnosis not present

## 2023-11-23 DIAGNOSIS — E875 Hyperkalemia: Secondary | ICD-10-CM | POA: Diagnosis not present

## 2023-11-23 DIAGNOSIS — N2589 Other disorders resulting from impaired renal tubular function: Secondary | ICD-10-CM | POA: Diagnosis not present

## 2023-11-23 DIAGNOSIS — R82998 Other abnormal findings in urine: Secondary | ICD-10-CM | POA: Diagnosis not present

## 2023-11-23 DIAGNOSIS — Z992 Dependence on renal dialysis: Secondary | ICD-10-CM | POA: Diagnosis not present

## 2023-11-23 DIAGNOSIS — D509 Iron deficiency anemia, unspecified: Secondary | ICD-10-CM | POA: Diagnosis not present

## 2023-11-23 NOTE — Telephone Encounter (Signed)
 Called Hector Brooks at [facility] they are trying to get in touch with Hector Brooks to get equipment shipped to him. I called Hector Brooks and gave them SNAPS number and told him to call to get it set up. Hector Brooks is also reaching out to Hector Brooks as well. Hector Brooks verbalized understanding. NFN

## 2023-11-23 NOTE — Telephone Encounter (Signed)
 Called patient to verify his appointment for 11/24/2023.  Patient states he did not have the HST yet.  He states he probably did not answer the phone if he did not know the number. Please reshedule the HST with patient.  He wants to have the study.  Will cancel his OV with Dr. Neysa for tomorrow.  [facility] team, please reschedule, thank you!

## 2023-11-24 ENCOUNTER — Ambulatory Visit: Admitting: Internal Medicine

## 2023-11-25 DIAGNOSIS — D631 Anemia in chronic kidney disease: Secondary | ICD-10-CM | POA: Diagnosis not present

## 2023-11-25 DIAGNOSIS — E875 Hyperkalemia: Secondary | ICD-10-CM | POA: Diagnosis not present

## 2023-11-25 DIAGNOSIS — Z992 Dependence on renal dialysis: Secondary | ICD-10-CM | POA: Diagnosis not present

## 2023-11-25 DIAGNOSIS — R82998 Other abnormal findings in urine: Secondary | ICD-10-CM | POA: Diagnosis not present

## 2023-11-25 DIAGNOSIS — D509 Iron deficiency anemia, unspecified: Secondary | ICD-10-CM | POA: Diagnosis not present

## 2023-11-25 DIAGNOSIS — N2589 Other disorders resulting from impaired renal tubular function: Secondary | ICD-10-CM | POA: Diagnosis not present

## 2023-11-25 DIAGNOSIS — N2581 Secondary hyperparathyroidism of renal origin: Secondary | ICD-10-CM | POA: Diagnosis not present

## 2023-11-25 DIAGNOSIS — N186 End stage renal disease: Secondary | ICD-10-CM | POA: Diagnosis not present

## 2023-11-27 DIAGNOSIS — E875 Hyperkalemia: Secondary | ICD-10-CM | POA: Diagnosis not present

## 2023-11-27 DIAGNOSIS — N2581 Secondary hyperparathyroidism of renal origin: Secondary | ICD-10-CM | POA: Diagnosis not present

## 2023-11-27 DIAGNOSIS — D631 Anemia in chronic kidney disease: Secondary | ICD-10-CM | POA: Diagnosis not present

## 2023-11-27 DIAGNOSIS — R82998 Other abnormal findings in urine: Secondary | ICD-10-CM | POA: Diagnosis not present

## 2023-11-27 DIAGNOSIS — Z992 Dependence on renal dialysis: Secondary | ICD-10-CM | POA: Diagnosis not present

## 2023-11-27 DIAGNOSIS — N186 End stage renal disease: Secondary | ICD-10-CM | POA: Diagnosis not present

## 2023-11-27 DIAGNOSIS — D509 Iron deficiency anemia, unspecified: Secondary | ICD-10-CM | POA: Diagnosis not present

## 2023-11-27 DIAGNOSIS — N2589 Other disorders resulting from impaired renal tubular function: Secondary | ICD-10-CM | POA: Diagnosis not present

## 2023-11-28 DIAGNOSIS — N186 End stage renal disease: Secondary | ICD-10-CM | POA: Diagnosis not present

## 2023-11-28 DIAGNOSIS — Z992 Dependence on renal dialysis: Secondary | ICD-10-CM | POA: Diagnosis not present

## 2023-11-28 DIAGNOSIS — E875 Hyperkalemia: Secondary | ICD-10-CM | POA: Diagnosis not present

## 2023-11-28 DIAGNOSIS — N2581 Secondary hyperparathyroidism of renal origin: Secondary | ICD-10-CM | POA: Diagnosis not present

## 2023-11-28 DIAGNOSIS — R82998 Other abnormal findings in urine: Secondary | ICD-10-CM | POA: Diagnosis not present

## 2023-11-28 DIAGNOSIS — D509 Iron deficiency anemia, unspecified: Secondary | ICD-10-CM | POA: Diagnosis not present

## 2023-11-28 DIAGNOSIS — N2589 Other disorders resulting from impaired renal tubular function: Secondary | ICD-10-CM | POA: Diagnosis not present

## 2023-11-28 DIAGNOSIS — D631 Anemia in chronic kidney disease: Secondary | ICD-10-CM | POA: Diagnosis not present

## 2023-11-29 DIAGNOSIS — R82998 Other abnormal findings in urine: Secondary | ICD-10-CM | POA: Diagnosis not present

## 2023-11-29 DIAGNOSIS — Z992 Dependence on renal dialysis: Secondary | ICD-10-CM | POA: Diagnosis not present

## 2023-11-29 DIAGNOSIS — N2581 Secondary hyperparathyroidism of renal origin: Secondary | ICD-10-CM | POA: Diagnosis not present

## 2023-11-29 DIAGNOSIS — E875 Hyperkalemia: Secondary | ICD-10-CM | POA: Diagnosis not present

## 2023-11-29 DIAGNOSIS — N2589 Other disorders resulting from impaired renal tubular function: Secondary | ICD-10-CM | POA: Diagnosis not present

## 2023-11-29 DIAGNOSIS — N186 End stage renal disease: Secondary | ICD-10-CM | POA: Diagnosis not present

## 2023-11-29 DIAGNOSIS — D631 Anemia in chronic kidney disease: Secondary | ICD-10-CM | POA: Diagnosis not present

## 2023-11-29 DIAGNOSIS — D509 Iron deficiency anemia, unspecified: Secondary | ICD-10-CM | POA: Diagnosis not present

## 2023-12-01 DIAGNOSIS — R82998 Other abnormal findings in urine: Secondary | ICD-10-CM | POA: Diagnosis not present

## 2023-12-01 DIAGNOSIS — D509 Iron deficiency anemia, unspecified: Secondary | ICD-10-CM | POA: Diagnosis not present

## 2023-12-01 DIAGNOSIS — N2581 Secondary hyperparathyroidism of renal origin: Secondary | ICD-10-CM | POA: Diagnosis not present

## 2023-12-01 DIAGNOSIS — N049 Nephrotic syndrome with unspecified morphologic changes: Secondary | ICD-10-CM | POA: Diagnosis not present

## 2023-12-01 DIAGNOSIS — D631 Anemia in chronic kidney disease: Secondary | ICD-10-CM | POA: Diagnosis not present

## 2023-12-01 DIAGNOSIS — N2589 Other disorders resulting from impaired renal tubular function: Secondary | ICD-10-CM | POA: Diagnosis not present

## 2023-12-01 DIAGNOSIS — E875 Hyperkalemia: Secondary | ICD-10-CM | POA: Diagnosis not present

## 2023-12-01 DIAGNOSIS — Z992 Dependence on renal dialysis: Secondary | ICD-10-CM | POA: Diagnosis not present

## 2023-12-01 DIAGNOSIS — N186 End stage renal disease: Secondary | ICD-10-CM | POA: Diagnosis not present

## 2023-12-02 ENCOUNTER — Ambulatory Visit (INDEPENDENT_AMBULATORY_CARE_PROVIDER_SITE_OTHER)

## 2023-12-02 VITALS — BP 124/64 | HR 90 | Temp 98.8°F | Ht 69.0 in | Wt 321.1 lb

## 2023-12-02 DIAGNOSIS — Z23 Encounter for immunization: Secondary | ICD-10-CM

## 2023-12-02 DIAGNOSIS — Z0000 Encounter for general adult medical examination without abnormal findings: Secondary | ICD-10-CM

## 2023-12-02 NOTE — Progress Notes (Signed)
 Subjective:   Hector Brooks is a 53 y.o. who presents for a Medicare Wellness preventive visit.  As a reminder, Annual Wellness Visits don't include a physical exam, and some assessments may be limited, especially if this visit is performed virtually. We may recommend an in-person follow-up visit with your provider if needed.  Visit Complete: In person    Persons Participating in Visit: Patient.  AWV Questionnaire: No: Patient Medicare AWV questionnaire was not completed prior to this visit.  Cardiac Risk Factors include: advanced age (>19men, >86 women);male gender;hypertension     Objective:    Today's Vitals   12/02/23 1538  BP: 124/64  Pulse: 90  Temp: 98.8 F (37.1 C)  TempSrc: Oral  SpO2: 96%  Weight: (!) 321 lb 1.6 oz (145.7 kg)  Height: 5' 9 (1.753 m)   Body mass index is 47.42 kg/m.     12/02/2023    3:56 PM 06/12/2022    8:01 AM 04/03/2022    7:02 AM 02/10/2022    6:57 AM 05/09/2021    7:00 AM 05/08/2021    9:57 PM 05/02/2021   12:04 PM  Advanced Directives  Does Patient Have a Medical Advance Directive? No No No No No No No  Would patient like information on creating a medical advance directive? No - Patient declined No - Patient declined No - Patient declined  No - Patient declined  No - Patient declined    Current Medications (verified) Outpatient Encounter Medications as of 12/02/2023  Medication Sig   acetaminophen  (TYLENOL ) 500 MG tablet Take 1,000 mg by mouth every 6 (six) hours as needed for headache.   calcitRIOL  (ROCALTROL ) 0.25 MCG capsule Take 1 capsule (0.25 mcg total) by mouth in the morning.   calcium  acetate (PHOSLO ) 667 MG capsule Take 2,668 mg by mouth See admin instructions. Take  4 capsules ( 2668 mg) by mouth with each meal and with each snacks   cinacalcet  (SENSIPAR ) 30 MG tablet Take 30 mg by mouth daily.   hydrALAZINE  (APRESOLINE ) 100 MG tablet Take 1 tablet (100 mg total) by mouth 2 (two) times daily.   metoprolol  succinate  (TOPROL -XL) 50 MG 24 hr tablet Take 50 mg by mouth daily.   Multiple Vitamins-Minerals (MULTIVITAMIN MEN 50+ PO) Take 1 tablet by mouth daily.   NIFEdipine  (PROCARDIA  XL/NIFEDICAL-XL) 90 MG 24 hr tablet TAKE 1 TABLET(90 MG) BY MOUTH AT BEDTIME (Patient not taking: Reported on 09/22/2023)   olmesartan  (BENICAR ) 40 MG tablet Take 1 tablet (40 mg total) by mouth at bedtime.   No facility-administered encounter medications on file as of 12/02/2023.    Allergies (verified) Patient has no known allergies.   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   Sleep apnea    does not use cpap   Vitamin D deficiency    Past Surgical History:  Procedure Laterality Date   A/V SHUNT INTERVENTION N/A 06/08/2023   Procedure: A/V SHUNT INTERVENTION;  Surgeon: Tobie Gordy POUR, MD;  Location: [facility] INVASIVE CV LAB;  Service: Cardiovascular;  Laterality: N/A;   AV FISTULA PLACEMENT Left 02/09/2020   Procedure: LEFT ARM FISTULA CREATION;  Surgeon: Serene Gaile ORN, MD;  Location: [facility] OR;  Service: Vascular;  Laterality: Left;   COLONOSCOPY WITH PROPOFOL  N/A 02/10/2022   Procedure: COLONOSCOPY WITH PROPOFOL ;  Surgeon: Leigh Elspeth SQUIBB, MD;  Location: [facility] ENDOSCOPY;  Service: Gastroenterology;  Laterality: N/A;   INSERTION OF DIALYSIS CATHETER Right 05/22/2020   Procedure: INSERTION OF TUNNELED DIALYSIS CATHETER;  Surgeon: Eliza Lonni RAMAN, MD;   Location: [facility] OR;  Service: Vascular;  Laterality: Right;   MENISCUS REPAIR Right 12/14   POLYPECTOMY  02/10/2022   Procedure: POLYPECTOMY;  Surgeon: Leigh Elspeth SQUIBB, MD;  Location: [facility] ENDOSCOPY;  Service: Gastroenterology;;   RADIOLOGY WITH ANESTHESIA N/A 04/03/2022   Procedure: MRI THORASIC SPINE WITH ANESTHESIA;  Surgeon: Radiologist, Medication, MD;  Location: [facility] OR;  Service: Radiology;  Laterality: N/A;   VENOUS ANGIOPLASTY Left 06/08/2023   Procedure: VENOUS ANGIOPLASTY;  Surgeon: Tobie Gordy POUR, MD;  Location: [facility] INVASIVE CV LAB;  Service: Cardiovascular;  Laterality: Left;  70% Outflow Cephalic Vein   Family History  Problem Relation Age of Onset   Prostate cancer Father    Diabetes Father    Drug abuse Father    Hyperlipidemia Sister    Stomach cancer Maternal Grandmother    Diabetes Maternal Grandfather    Heart disease Maternal Grandfather    Social History   Socioeconomic History   Marital status: Married    Spouse name: Not on file   Number of children: Not on file   Years of education: Not on file   Highest education level: Not on file  Occupational History   Not on file  Tobacco Use   Smoking status: Never   Smokeless tobacco: Never  Vaping Use   Vaping status: Never Used  Substance and Sexual Activity   Alcohol use: No    Alcohol/week: 0.0 standard drinks of alcohol   Drug use: No   Sexual activity: Yes  Other Topics Concern   Not on file  Social History Narrative   Not on file   Social Drivers of Health   Financial Resource Strain: Low Risk  (12/02/2023)   Overall Financial Resource Strain (CARDIA)    Difficulty of Paying Living Expenses: Not hard at all  Food Insecurity: No Food Insecurity (12/02/2023)   Hunger Vital Sign    Worried About Running Out of Food in the Last Year: Never true    Ran Out of Food in the Last Year: Never true  Transportation Needs: No Transportation Needs (12/02/2023)   PRAPARE - Administrator, Civil Service  (Medical): No    Lack of Transportation (Non-Medical): No  Physical Activity: Inactive (12/02/2023)   Exercise Vital Sign    Days of Exercise per Week: 0 days    Minutes of Exercise per Session: 0 min  Stress: No Stress Concern Present (12/02/2023)   [employer] of Occupational Health - Occupational Stress Questionnaire    Feeling of Stress: Not at all  Social Connections: Moderately Isolated (12/02/2023)   Social Connection and Isolation Panel    Frequency of Communication with Friends and Family: More than three times a week    Frequency of Social Gatherings with Friends and Family: More than three times a week  Attends Religious Services: Never    Active Member of Clubs or Organizations: No    Attends Banker Meetings: Never    Marital Status: Married    Tobacco Counseling Counseling given: Not Answered    Clinical Intake:  Pre-visit preparation completed: Yes  Pain : No/denies pain     BMI - recorded: 4742 Nutritional Status: BMI > 30  Obese Nutritional Risks: None Diabetes: No  Lab Results  Component Value Date   HGBA1C 5.4 08/16/2019   HGBA1C 5.9 (H) 01/04/2019   HGBA1C 5.9 07/02/2017     How often do you need to have someone help you when you read instructions, pamphlets, or other written materials from your doctor or pharmacy?: 1 - Never  Interpreter Needed?: No  Information entered by :: Rojelio Blush LPN   Activities of Daily Living     12/02/2023    3:56 PM 06/08/2023    9:09 AM  In your present state of health, do you have any difficulty performing the following activities:  Hearing? 0 0  Vision? 0 0  Difficulty concentrating or making decisions? 0 0  Walking or climbing stairs? 0   Dressing or bathing? 0   Doing errands, shopping? 0   Preparing Food and eating ? N   Using the Toilet? N   In the past six months, have you accidently leaked urine? N   Do you have problems with loss of bowel control? N   Managing your  Medications? N   Managing your Finances? N   Housekeeping or managing your Housekeeping? N     Patient Care Team: Theophilus Andrews, Tully GRADE, MD as PCP - [facility], [facility]  I have updated your Care Teams any recent Medical Services you may have received from other providers in the past year.     Assessment:   This is a routine wellness examination for [place].  Hearing/Vision screen Hearing Screening - Comments:: Denies hearing difficulties   Vision Screening - Comments:: Wears rx glasses - up to date with routine eye exams with  [facility]   Goals Addressed               This Visit's Progress     Increase physical activity (pt-stated)        Get more active.       Depression Screen     12/02/2023    3:43 PM 06/12/2022    7:56 AM 05/19/2022    2:34 PM 03/21/2020    4:05 PM 10/11/2019   10:14 AM 09/13/2019   10:04 AM 08/16/2019    9:43 AM  PHQ 2/9 Scores  PHQ - 2 Score 0 0 0 0 0 0 0  PHQ- 9 Score  0 0  2      Fall Risk     12/02/2023    3:56 PM 06/12/2022    8:00 AM 05/19/2022    2:34 PM 05/18/2020   10:31 AM 03/21/2020    4:05 PM  Fall Risk   Falls in the past year? 0 0 0 0 0  Number falls in past yr: 0 0 0  0  Injury with Fall? 0 0 0  0  Risk for fall due to : No Fall Risks No Fall Risks No Fall Risks    Follow up Falls evaluation completed Falls evaluation completed Falls evaluation completed Falls evaluation completed  Falls evaluation completed      Data saved with a previous flowsheet  row definition    MEDICARE RISK AT HOME:  Medicare Risk at Home Any stairs in or around the home?: Yes If so, are there any without handrails?: No Home free of loose throw rugs in walkways, pet beds, electrical cords, etc?: Yes Adequate lighting in your home to reduce risk of falls?: Yes Life alert?: No Use of a cane, walker or w/c?: No Grab bars in the bathroom?: No Shower chair or bench in shower?: No Elevated toilet seat or a  handicapped toilet?: No  TIMED UP AND GO:  Was the test performed?  Yes  Length of time to ambulate 10 feet: 10 sec Gait steady and fast without use of assistive device  Cognitive Function: 6CIT completed        12/02/2023    3:56 PM 06/12/2022    8:01 AM  6CIT Screen  What Year? 0 points 0 points  What month? 0 points 0 points  What time? 0 points 0 points  Count back from 20 0 points 0 points  Months in reverse 0 points 0 points  Repeat phrase 0 points 0 points  Total Score 0 points 0 points    Immunizations Immunization History  Administered Date(s) Administered   Hepb-cpg 06/07/2020, 07/20/2020, 08/13/2020   Influenza, Seasonal, Injecte, Preservative Fre 12/02/2023   Influenza,inj,Quad PF,6+ Mos 01/04/2019, 02/19/2021, 12/12/2021   Influenza-Unspecified 01/04/2019   PFIZER Comirnaty(Gray Top)Covid-19 Tri-Sucrose Vaccine 08/26/2019   PFIZER(Purple Top)SARS-COV-2 Vaccination 08/26/2019   PNEUMOCOCCAL CONJUGATE-20 02/19/2021   Pneumococcal Conjugate-13 05/31/2020   Tdap 08/24/2013   Zoster Recombinant(Shingrix ) 05/19/2022    Screening Tests Health Maintenance  Topic Date Due   COVID-19 Vaccine (3 - Pfizer risk series) 09/23/2019   Zoster Vaccines- Shingrix  (2 of 2) 07/14/2022   DTaP/Tdap/Td (2 - Td or Tdap) 08/25/2023   Medicare Annual Wellness (AWV)  12/01/2024   Colonoscopy  02/11/2032   Pneumococcal Vaccine: 50+ Years  Completed   Influenza Vaccine  Completed   Hepatitis B Vaccines 19-59 Average Risk  Completed   Hepatitis C Screening  Completed   HIV Screening  Completed   HPV VACCINES  Aged Out   Meningococcal B Vaccine  Aged Out    Health Maintenance Items Addressed: Flu Vaccine given today  Additional Screening:  Vision Screening: Recommended annual ophthalmology exams for early detection of glaucoma and other disorders of the eye. Is the patient up to date with their annual eye exam?  Yes  Who is the provider or what is the name of the office in  which the patient attends annual eye exams? [facility]  Dental Screening: Recommended annual dental exams for proper oral hygiene  Community Resource Referral / Chronic Care Management: CRR required this visit?  No   CCM required this visit?  No   Plan:    I have personally reviewed and noted the following in the patient's chart:   Medical and social history Use of alcohol, tobacco or illicit drugs  Current medications and supplements including opioid prescriptions. Patient is not currently taking opioid prescriptions. Functional ability and status Nutritional status Physical activity Advanced directives List of other physicians Hospitalizations, surgeries, and ER visits in previous 12 months Vitals Screenings to include cognitive, depression, and falls Referrals and appointments  In addition, I have reviewed and discussed with patient certain preventive protocols, quality metrics, and best practice recommendations. A written personalized care plan for preventive services as well as general preventive health recommendations were provided to patient.   Rojelio LELON Blush, LPN  12/02/2023   After Visit Summary: (In Person-Printed) AVS printed and given to the patient  Notes: Nothing significant to report at this time.

## 2023-12-02 NOTE — Patient Instructions (Addendum)
 Hector Brooks,  Thank you for taking the time for your Medicare Wellness Visit. I appreciate your continued commitment to your health goals. Please review the care plan we discussed, and feel free to reach out if I can assist you further.  Medicare recommends these wellness visits once per year to help you and your care team stay ahead of potential health issues. These visits are designed to focus on prevention, allowing your provider to concentrate on managing your acute and chronic conditions during your regular appointments.  Please note that Annual Wellness Visits do not include a physical exam. Some assessments may be limited, especially if the visit was conducted virtually. If needed, we may recommend a separate in-person follow-up with your provider.  Ongoing Care Seeing your primary care provider every 3 to 6 months helps us  monitor your health and provide consistent, personalized care.   Referrals If a referral was made during today's visit and you haven't received any updates within two weeks, please contact the referred provider directly to check on the status.  Recommended Screenings:  Health Maintenance  Topic Date Due   COVID-19 Vaccine (3 - Pfizer risk series) 09/23/2019   Zoster (Shingles) Vaccine (2 of 2) 07/14/2022   DTaP/Tdap/Td vaccine (2 - Td or Tdap) 08/25/2023   Medicare Annual Wellness Visit  12/01/2024   Colon Cancer Screening  02/11/2032   Pneumococcal Vaccine for age over 63  Completed   Flu Shot  Completed   Hepatitis B Vaccine  Completed   Hepatitis C Screening  Completed   HIV Screening  Completed   HPV Vaccine  Aged Out   Meningitis B Vaccine  Aged Out       12/02/2023    3:56 PM  Advanced Directives  Does Patient Have a Medical Advance Directive? No  Would patient like information on creating a medical advance directive? No - Patient declined   Advance Care Planning is important because it: Ensures you receive medical care that aligns with your  values, goals, and preferences. Provides guidance to your family and loved ones, reducing the emotional burden of decision-making during critical moments.  Vision: Annual vision screenings are recommended for early detection of glaucoma, cataracts, and diabetic retinopathy. These exams can also reveal signs of chronic conditions such as diabetes and high blood pressure.  Dental: Annual dental screenings help detect early signs of oral cancer, gum disease, and other conditions linked to overall health, including heart disease and diabetes.  Please see the attached documents for additional preventive care recommendations.

## 2023-12-27 ENCOUNTER — Encounter

## 2023-12-27 DIAGNOSIS — G4733 Obstructive sleep apnea (adult) (pediatric): Secondary | ICD-10-CM

## 2024-01-10 DIAGNOSIS — G4733 Obstructive sleep apnea (adult) (pediatric): Secondary | ICD-10-CM | POA: Diagnosis not present

## 2024-01-14 ENCOUNTER — Telehealth: Payer: Self-pay

## 2024-01-14 NOTE — Telephone Encounter (Signed)
 Your insurance has us  listed as your primary care provider. Please call the office to make an appointment or let us  know that you seek primary care elsewhere.

## 2024-01-19 NOTE — Telephone Encounter (Signed)
 Called patient to schedule with PCP. Left message to call back

## 2024-01-27 ENCOUNTER — Encounter

## 2024-02-12 IMAGING — CT CT ABD-PELV W/O CM
2 of 4 series · 16 of 46 positions shown, 18 images · non-contrast
Comparison: None.

CLINICAL DATA: Back pain.



[Series 2: axial st · axial · 0.98mm/px · z∈[-1225,-750]mm · 13 of 105 slices shown, 15 images]
[im 5/105  soft-tissue]
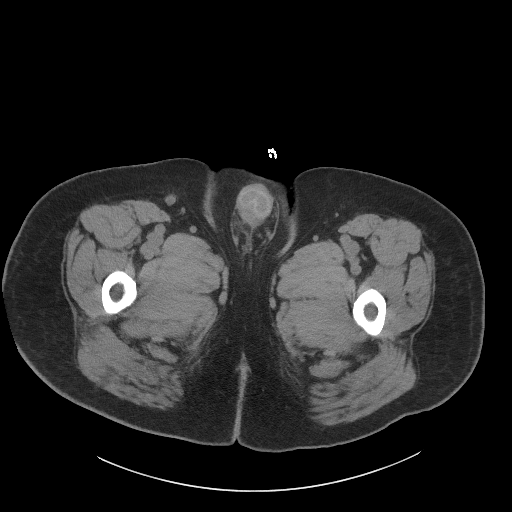
[im 5/105  bone]
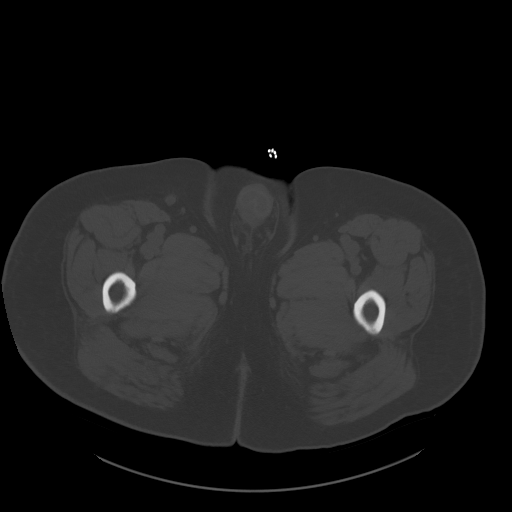
[im 13/105  soft-tissue]
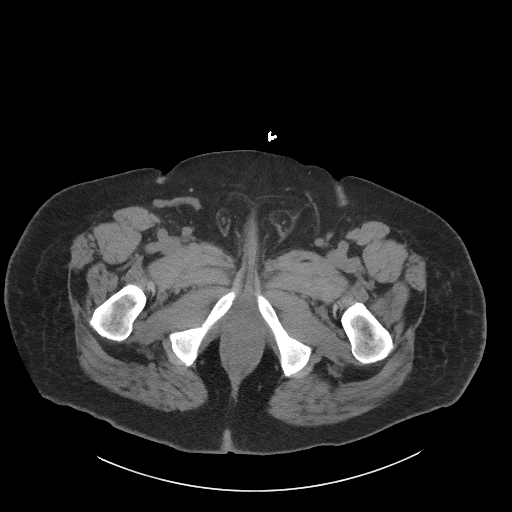
[im 21/105  soft-tissue]
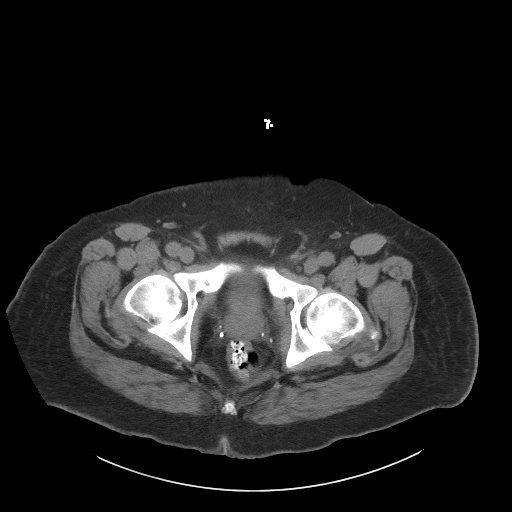
[im 30/105  soft-tissue]
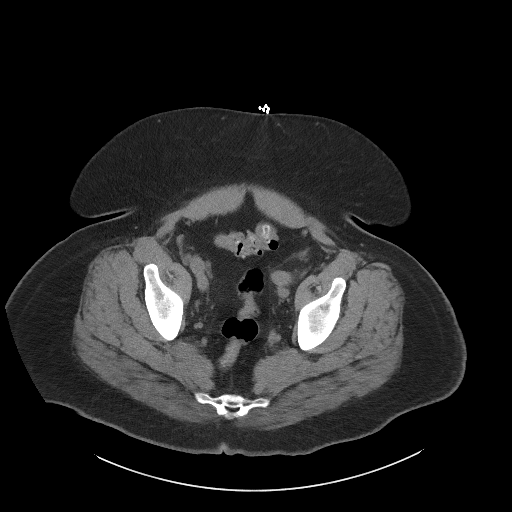
[im 38/105  soft-tissue]
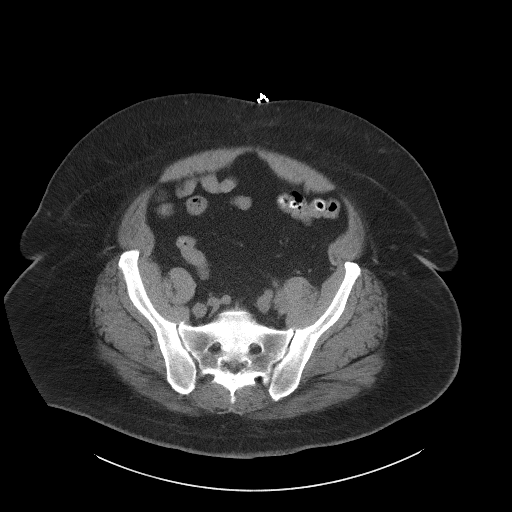
[im 46/105  soft-tissue]
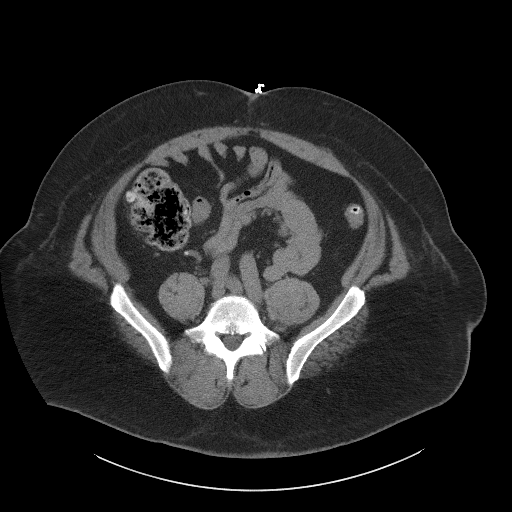
[im 55/105  soft-tissue]
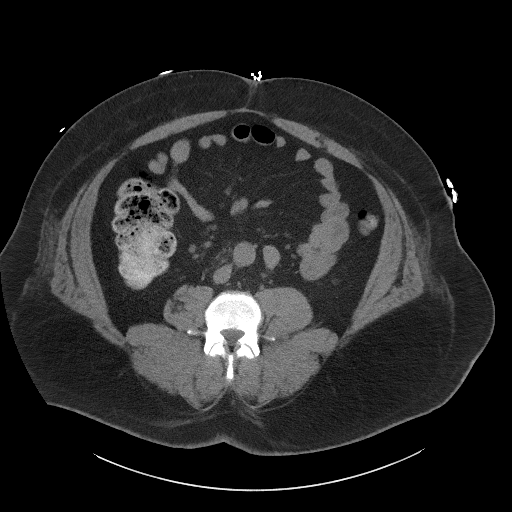
[im 59/105  soft-tissue]
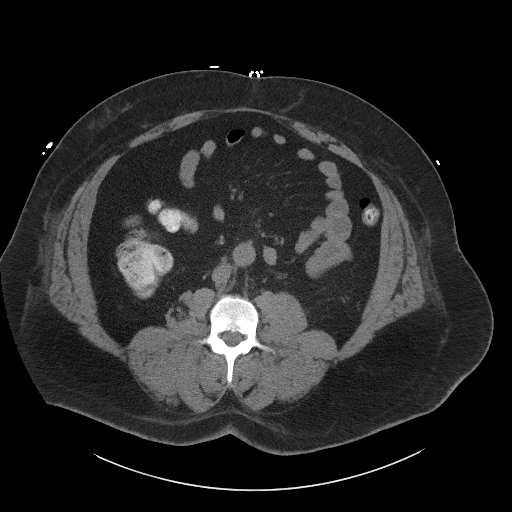
[im 67/105  soft-tissue]
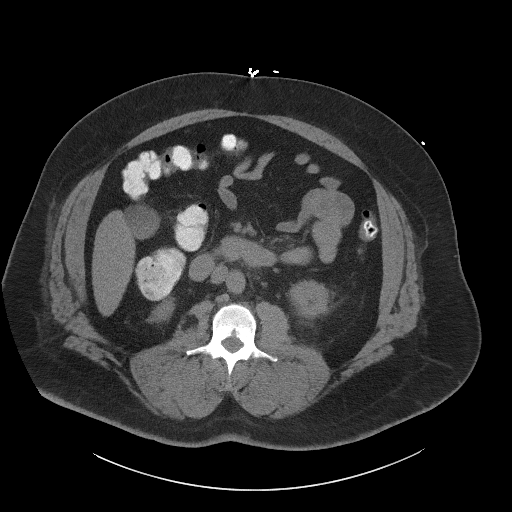
[im 67/105  bone]
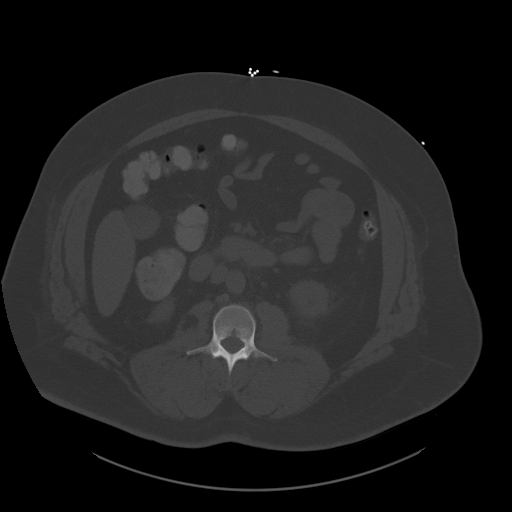
[im 75/105  soft-tissue]
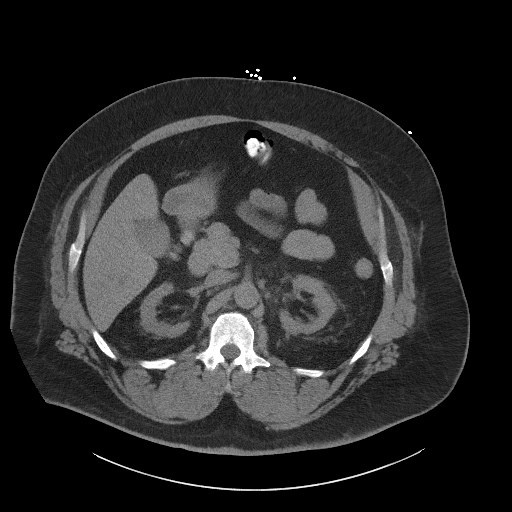
[im 84/105  soft-tissue]
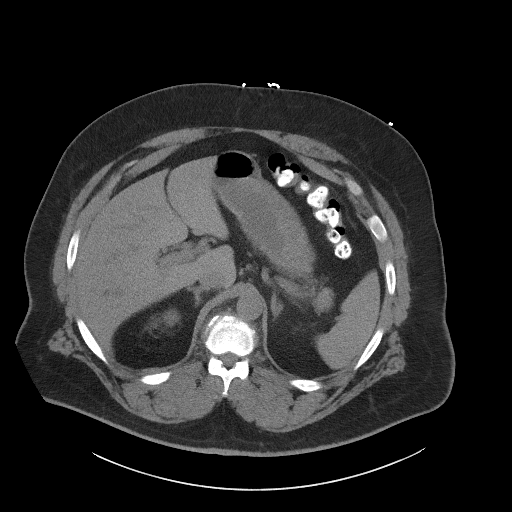
[im 92/105  soft-tissue]
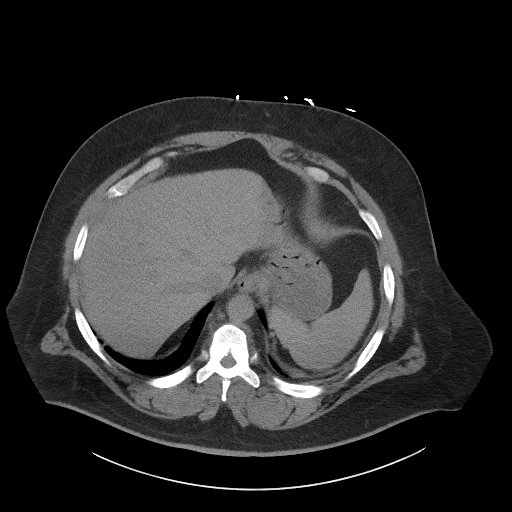
[im 100/105  soft-tissue]
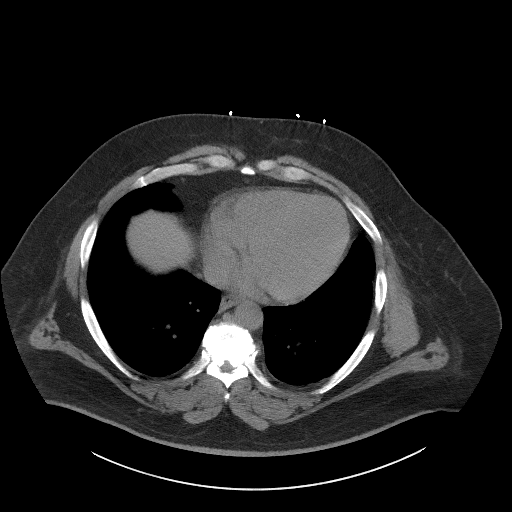

[Series 5: coronal st · coronal · 0.98mm/px · 3 of 124 slices shown]
[im 42/124  soft-tissue]
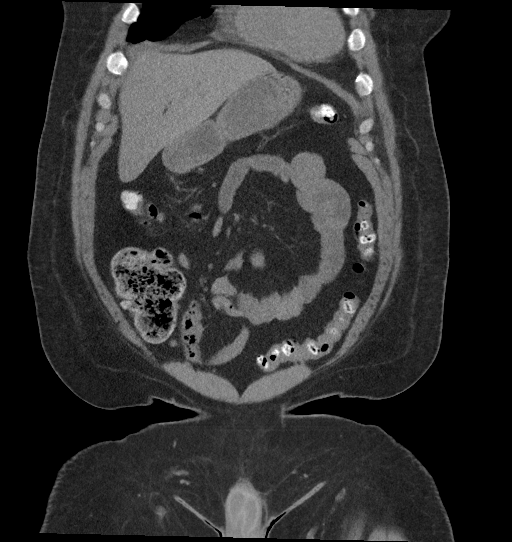
[im 55/124  soft-tissue]
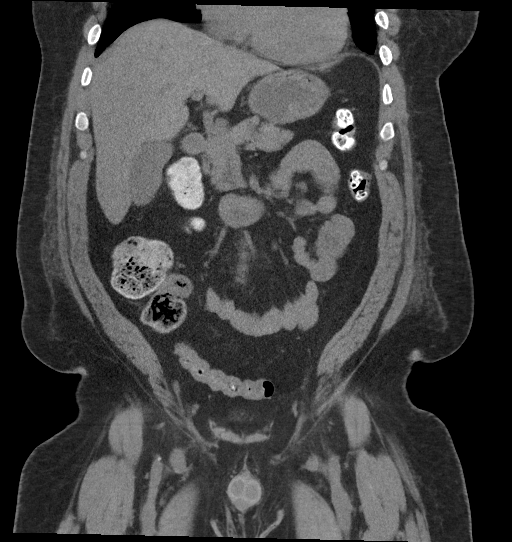
[im 69/124  soft-tissue]
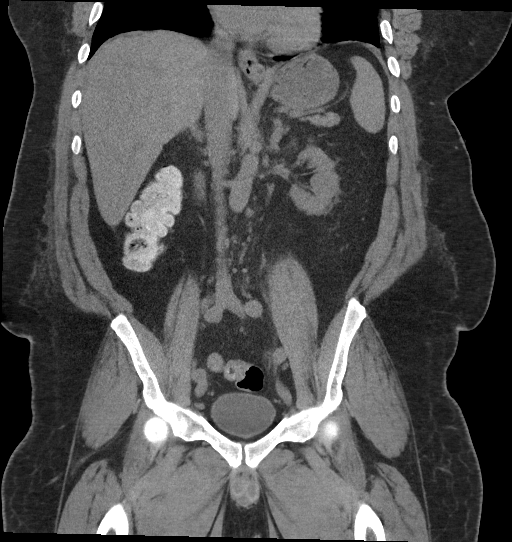

[16 of 46 positions shown; findings below may reference images not displayed]

FINDINGS: Lower chest: No acute abnormality.

Hepatobiliary: No focal liver abnormality is seen. No gallstones,
gallbladder wall thickening, or biliary dilatation.

Pancreas: Unremarkable. No pancreatic ductal dilatation or
surrounding inflammatory changes.

Spleen: Normal in size without focal abnormality.

Adrenals/Urinary Tract: Adrenal glands are unremarkable. The kidneys
are slightly small in size and lobulated in appearance. There is no
evidence of obstructing renal calculi, focal renal lesion, or
hydronephrosis. Mild to moderate severity, bilateral, nonspecific
perinephric inflammatory fat stranding is seen. Bladder is
unremarkable.

Stomach/Bowel: Stomach is within normal limits. Appendix appears
normal. No evidence of bowel wall thickening, distention, or
inflammatory changes. Noninflamed diverticula are seen within the
cecum and sigmoid colon.

Vascular/Lymphatic: No significant vascular findings are present.

A 16 mm x 13 mm x 15 mm lymph node is seen within the region in
between the tail of the pancreas and left kidney.

Reproductive: Prostate is unremarkable.

Other: A 2.1 cm x 1.8 cm fat containing right inguinal hernia is
seen. A similar appearing 3.2 cm x 2.0 cm fat containing left
inguinal hernia is noted. No abdominopelvic ascites.

Musculoskeletal: No acute or significant osseous findings.
IMPRESSION: 1. Bilateral nonspecific perinephric inflammatory fat stranding.
While this may be chronic in nature, correlation with urinalysis is
recommended to exclude the presence of an acute pyelonephritis.
2. Sigmoid diverticulosis.

## 2024-02-13 IMAGING — NM NM PULMONARY PERF PARTICULATE
1 series · 8 of 8 positions shown · non-contrast
Comparison: Chest radiograph performed on the same date. CT
examination dated May 08, 2021

CLINICAL DATA: Tachycardia.

EXAM:
NUCLEAR MEDICINE PERFUSION LUNG SCAN
TECHNIQUE: Perfusion images were obtained in multiple projections after
intravenous injection of radiopharmaceutical.
Ventilation scans intentionally deferred if perfusion scan and chest
x-ray adequate for interpretation during COVID 19 epidemic.
RADIOPHARMACEUTICALS:  4.31 mCi 0c-44m MAA IV

[Series 1000: lung perfusion · 1.95mm/px · 4 acquisitions, 8 frames shown]
[im 1/4]
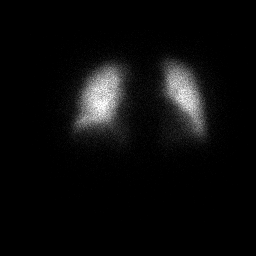
[im 1/4]
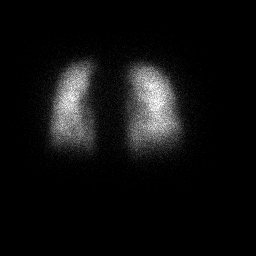
[im 2/4]
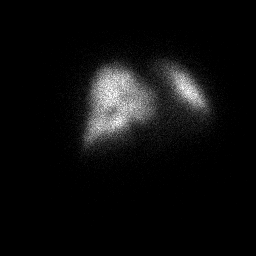
[im 2/4]
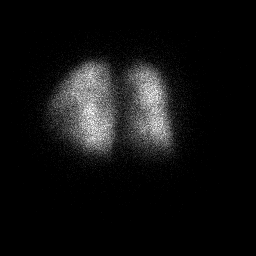
[im 3/4]
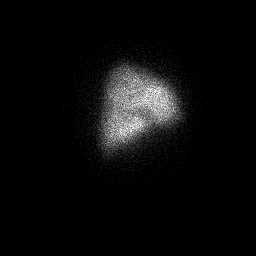
[im 3/4]
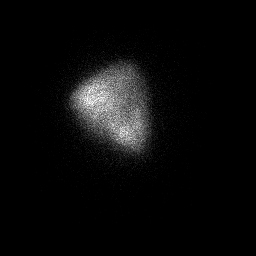
[im 4/4]
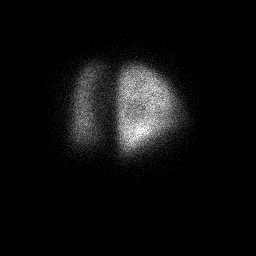
[im 4/4]
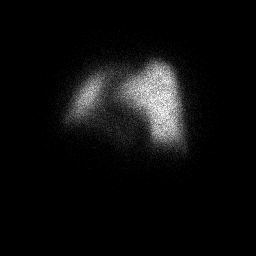

[8 of 8 positions shown; findings below may reference images not displayed]

FINDINGS: Prominent cardiac shadow. No segmental filling defect concerning for
pulmonary embolism.
IMPRESSION: Low probability of pulmonary embolism.

## 2024-02-13 IMAGING — CR DG CHEST 1V PORT
1 series · 1 of 1 positions shown · non-contrast
Comparison: 11/12/2020

CLINICAL DATA: Tachycardia

EXAM:
PORTABLE CHEST 1 VIEW

[chest ap]
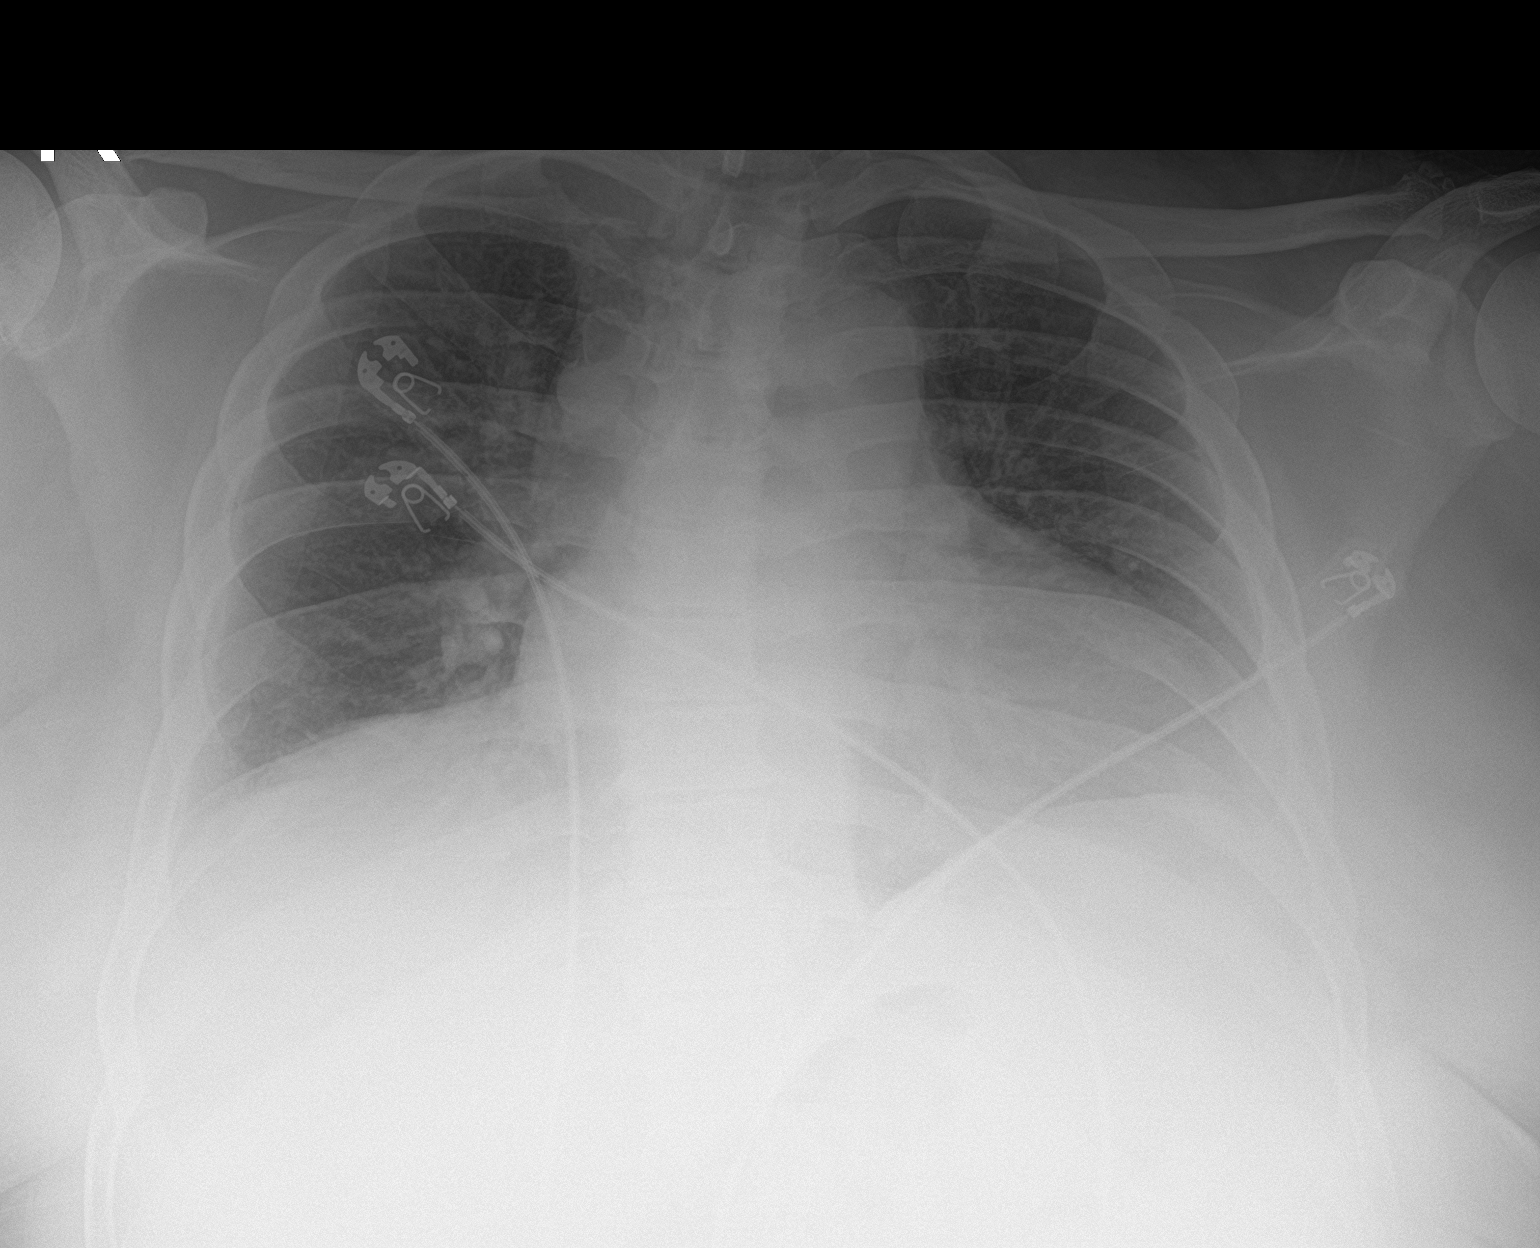

[1 of 1 positions shown; findings below may reference images not displayed]

FINDINGS: Shallow inspiration with low lung volumes. Chronic mild elevation of
the right hemidiaphragm. No new consolidation or edema. No pleural
effusion. Mild cardiomegaly.
IMPRESSION: Shallow inspiration with chronic mild elevation of the right
hemidiaphragm. Mild cardiomegaly.

## 2024-12-09 ENCOUNTER — Ambulatory Visit
